# Patient Record
Sex: Female | Born: 1950
Health system: Southern US, Community
[De-identification: ages and names within clinical notes are randomized; demographics above are authoritative.]

## PROBLEM LIST (undated history)

## (undated) DIAGNOSIS — G8929 Other chronic pain: Secondary | ICD-10-CM

## (undated) DIAGNOSIS — Z923 Personal history of irradiation: Secondary | ICD-10-CM

## (undated) DIAGNOSIS — M1990 Unspecified osteoarthritis, unspecified site: Secondary | ICD-10-CM

## (undated) DIAGNOSIS — M545 Low back pain, unspecified: Secondary | ICD-10-CM

## (undated) DIAGNOSIS — K922 Gastrointestinal hemorrhage, unspecified: Secondary | ICD-10-CM

## (undated) DIAGNOSIS — K219 Gastro-esophageal reflux disease without esophagitis: Secondary | ICD-10-CM

## (undated) DIAGNOSIS — C50911 Malignant neoplasm of unspecified site of right female breast: Secondary | ICD-10-CM

## (undated) DIAGNOSIS — G709 Myoneural disorder, unspecified: Secondary | ICD-10-CM

## (undated) DIAGNOSIS — K760 Fatty (change of) liver, not elsewhere classified: Secondary | ICD-10-CM

## (undated) DIAGNOSIS — C50919 Malignant neoplasm of unspecified site of unspecified female breast: Secondary | ICD-10-CM

## (undated) DIAGNOSIS — I10 Essential (primary) hypertension: Secondary | ICD-10-CM

## (undated) DIAGNOSIS — E876 Hypokalemia: Secondary | ICD-10-CM

## (undated) HISTORY — PX: FINGER SURGERY: SHX640

## (undated) HISTORY — DX: Hypokalemia: E87.6

## (undated) HISTORY — PX: COLONOSCOPY: SHX5424

## (undated) HISTORY — DX: Fatty (change of) liver, not elsewhere classified: K76.0

---

## 1998-01-20 ENCOUNTER — Ambulatory Visit (HOSPITAL_COMMUNITY): Admission: RE | Admit: 1998-01-20 | Discharge: 1998-01-20 | Payer: Self-pay | Admitting: Family Medicine

## 1998-01-20 ENCOUNTER — Encounter: Payer: Self-pay | Admitting: Family Medicine

## 1998-08-06 ENCOUNTER — Emergency Department (HOSPITAL_COMMUNITY): Admission: EM | Admit: 1998-08-06 | Discharge: 1998-08-06 | Payer: Self-pay | Admitting: Emergency Medicine

## 2002-02-23 ENCOUNTER — Encounter: Payer: Self-pay | Admitting: Internal Medicine

## 2002-02-23 ENCOUNTER — Ambulatory Visit (HOSPITAL_COMMUNITY): Admission: RE | Admit: 2002-02-23 | Discharge: 2002-02-23 | Payer: Self-pay | Admitting: Internal Medicine

## 2002-04-06 ENCOUNTER — Encounter: Admission: RE | Admit: 2002-04-06 | Discharge: 2002-04-06 | Payer: Self-pay | Admitting: *Deleted

## 2002-04-23 ENCOUNTER — Encounter (INDEPENDENT_AMBULATORY_CARE_PROVIDER_SITE_OTHER): Payer: Self-pay | Admitting: Specialist

## 2002-04-23 ENCOUNTER — Ambulatory Visit (HOSPITAL_COMMUNITY): Admission: RE | Admit: 2002-04-23 | Discharge: 2002-04-23 | Payer: Self-pay | Admitting: Internal Medicine

## 2003-09-05 ENCOUNTER — Emergency Department (HOSPITAL_COMMUNITY): Admission: EM | Admit: 2003-09-05 | Discharge: 2003-09-05 | Payer: Self-pay | Admitting: Family Medicine

## 2004-03-13 ENCOUNTER — Ambulatory Visit: Payer: Self-pay | Admitting: Nurse Practitioner

## 2004-04-20 ENCOUNTER — Ambulatory Visit: Payer: Self-pay | Admitting: Nurse Practitioner

## 2004-04-30 ENCOUNTER — Ambulatory Visit (HOSPITAL_COMMUNITY): Admission: RE | Admit: 2004-04-30 | Discharge: 2004-04-30 | Payer: Self-pay | Admitting: Family Medicine

## 2004-05-07 ENCOUNTER — Ambulatory Visit: Payer: Self-pay | Admitting: Nurse Practitioner

## 2004-05-08 ENCOUNTER — Ambulatory Visit: Payer: Self-pay | Admitting: *Deleted

## 2005-06-12 ENCOUNTER — Ambulatory Visit: Payer: Self-pay | Admitting: Nurse Practitioner

## 2005-06-26 ENCOUNTER — Ambulatory Visit (HOSPITAL_COMMUNITY): Admission: RE | Admit: 2005-06-26 | Discharge: 2005-06-26 | Payer: Self-pay | Admitting: Family Medicine

## 2005-07-29 ENCOUNTER — Ambulatory Visit: Payer: Self-pay | Admitting: Nurse Practitioner

## 2005-09-04 ENCOUNTER — Ambulatory Visit: Payer: Self-pay | Admitting: Obstetrics & Gynecology

## 2005-09-17 ENCOUNTER — Ambulatory Visit: Payer: Self-pay | Admitting: Internal Medicine

## 2005-10-03 ENCOUNTER — Ambulatory Visit: Payer: Self-pay | Admitting: Nurse Practitioner

## 2005-10-15 ENCOUNTER — Ambulatory Visit: Payer: Self-pay | Admitting: Nurse Practitioner

## 2005-12-25 ENCOUNTER — Ambulatory Visit: Payer: Self-pay | Admitting: Nurse Practitioner

## 2005-12-25 ENCOUNTER — Ambulatory Visit (HOSPITAL_COMMUNITY): Admission: RE | Admit: 2005-12-25 | Discharge: 2005-12-25 | Payer: Self-pay | Admitting: Nurse Practitioner

## 2006-05-20 ENCOUNTER — Ambulatory Visit: Payer: Self-pay | Admitting: Nurse Practitioner

## 2006-12-17 ENCOUNTER — Encounter (INDEPENDENT_AMBULATORY_CARE_PROVIDER_SITE_OTHER): Payer: Self-pay | Admitting: *Deleted

## 2007-01-30 ENCOUNTER — Encounter (INDEPENDENT_AMBULATORY_CARE_PROVIDER_SITE_OTHER): Payer: Self-pay | Admitting: Nurse Practitioner

## 2007-01-30 DIAGNOSIS — G4700 Insomnia, unspecified: Secondary | ICD-10-CM

## 2007-01-30 DIAGNOSIS — F1010 Alcohol abuse, uncomplicated: Secondary | ICD-10-CM

## 2007-01-30 DIAGNOSIS — D649 Anemia, unspecified: Secondary | ICD-10-CM

## 2007-01-30 DIAGNOSIS — D259 Leiomyoma of uterus, unspecified: Secondary | ICD-10-CM | POA: Insufficient documentation

## 2007-05-01 ENCOUNTER — Ambulatory Visit: Payer: Self-pay | Admitting: Internal Medicine

## 2007-05-20 ENCOUNTER — Ambulatory Visit: Payer: Self-pay | Admitting: Internal Medicine

## 2007-05-20 ENCOUNTER — Encounter (INDEPENDENT_AMBULATORY_CARE_PROVIDER_SITE_OTHER): Payer: Self-pay | Admitting: Nurse Practitioner

## 2007-05-20 LAB — CONVERTED CEMR LAB
HDL: 99 mg/dL (ref >39)
LDL Cholesterol: 69 mg/dL (ref 0–99)
Total CHOL/HDL Ratio: 1.9

## 2007-06-10 ENCOUNTER — Encounter (INDEPENDENT_AMBULATORY_CARE_PROVIDER_SITE_OTHER): Payer: Self-pay | Admitting: Nurse Practitioner

## 2007-06-10 ENCOUNTER — Ambulatory Visit: Payer: Self-pay | Admitting: Internal Medicine

## 2007-06-17 ENCOUNTER — Ambulatory Visit (HOSPITAL_COMMUNITY): Admission: RE | Admit: 2007-06-17 | Discharge: 2007-06-17 | Payer: Self-pay | Admitting: Family Medicine

## 2007-09-09 ENCOUNTER — Ambulatory Visit: Payer: Self-pay | Admitting: Internal Medicine

## 2007-09-11 ENCOUNTER — Ambulatory Visit (HOSPITAL_COMMUNITY): Admission: RE | Admit: 2007-09-11 | Discharge: 2007-09-11 | Payer: Self-pay | Admitting: Family Medicine

## 2007-10-21 ENCOUNTER — Encounter: Admission: RE | Admit: 2007-10-21 | Discharge: 2007-12-24 | Payer: Self-pay | Admitting: Family Medicine

## 2007-12-14 ENCOUNTER — Ambulatory Visit: Payer: Self-pay | Admitting: Internal Medicine

## 2007-12-28 ENCOUNTER — Ambulatory Visit: Payer: Self-pay | Admitting: Internal Medicine

## 2007-12-28 ENCOUNTER — Encounter (INDEPENDENT_AMBULATORY_CARE_PROVIDER_SITE_OTHER): Payer: Self-pay | Admitting: Family Medicine

## 2007-12-28 LAB — CONVERTED CEMR LAB
ALT: 23 units/L (ref 0–35)
Alkaline Phosphatase: 61 units/L (ref 39–117)
Sodium: 138 meq/L (ref 135–145)
Total Bilirubin: 0.4 mg/dL (ref 0.3–1.2)
Total Protein: 7.7 g/dL (ref 6.0–8.3)

## 2008-01-22 ENCOUNTER — Encounter (INDEPENDENT_AMBULATORY_CARE_PROVIDER_SITE_OTHER): Payer: Self-pay | Admitting: *Deleted

## 2008-01-22 ENCOUNTER — Ambulatory Visit: Payer: Self-pay | Admitting: Internal Medicine

## 2008-01-22 LAB — CONVERTED CEMR LAB
Albumin: 4.2 g/dL (ref 3.5–5.2)
Alkaline Phosphatase: 58 units/L (ref 39–117)
BUN: 17 mg/dL (ref 6–23)
Glucose, Bld: 95 mg/dL (ref 70–99)
Total Bilirubin: 0.4 mg/dL (ref 0.3–1.2)

## 2008-02-09 ENCOUNTER — Ambulatory Visit: Payer: Self-pay | Admitting: Family Medicine

## 2008-03-09 ENCOUNTER — Ambulatory Visit: Payer: Self-pay | Admitting: Internal Medicine

## 2008-05-04 ENCOUNTER — Ambulatory Visit: Payer: Self-pay | Admitting: Internal Medicine

## 2008-05-26 ENCOUNTER — Ambulatory Visit: Payer: Self-pay | Admitting: Internal Medicine

## 2008-06-17 ENCOUNTER — Ambulatory Visit (HOSPITAL_COMMUNITY): Admission: RE | Admit: 2008-06-17 | Discharge: 2008-06-17 | Payer: Self-pay | Admitting: Family Medicine

## 2008-06-23 ENCOUNTER — Ambulatory Visit: Payer: Self-pay | Admitting: Family Medicine

## 2008-10-12 ENCOUNTER — Ambulatory Visit: Payer: Self-pay | Admitting: Family Medicine

## 2008-10-13 ENCOUNTER — Ambulatory Visit: Payer: Self-pay | Admitting: Internal Medicine

## 2008-11-03 ENCOUNTER — Encounter (INDEPENDENT_AMBULATORY_CARE_PROVIDER_SITE_OTHER): Payer: Self-pay | Admitting: Internal Medicine

## 2008-11-03 ENCOUNTER — Ambulatory Visit: Payer: Self-pay | Admitting: Internal Medicine

## 2008-11-03 LAB — CONVERTED CEMR LAB
Cholesterol: 212 mg/dL — ABNORMAL HIGH (ref 0–200)
GC Probe Amp, Genital: NEGATIVE
HDL: 97 mg/dL (ref >39)
Total CHOL/HDL Ratio: 2.2

## 2008-11-04 ENCOUNTER — Encounter (INDEPENDENT_AMBULATORY_CARE_PROVIDER_SITE_OTHER): Payer: Self-pay | Admitting: Internal Medicine

## 2008-11-29 ENCOUNTER — Ambulatory Visit: Payer: Self-pay | Admitting: Internal Medicine

## 2008-11-29 ENCOUNTER — Encounter (INDEPENDENT_AMBULATORY_CARE_PROVIDER_SITE_OTHER): Payer: Self-pay | Admitting: Internal Medicine

## 2008-11-29 LAB — CONVERTED CEMR LAB
AST: 26 units/L (ref 0–37)
BUN: 11 mg/dL (ref 6–23)
CO2: 23 meq/L (ref 19–32)
Calcium: 9.5 mg/dL (ref 8.4–10.5)
Chloride: 103 meq/L (ref 96–112)
Creatinine, Ser: 0.94 mg/dL (ref 0.40–1.20)
Eosinophils Absolute: 0.5 10*3/uL (ref 0.0–0.7)
Eosinophils Relative: 7 % — ABNORMAL HIGH (ref 0–5)
Glucose, Bld: 93 mg/dL (ref 70–99)
HCT: 40.2 % (ref 36.0–46.0)
Lymphs Abs: 3.2 10*3/uL (ref 0.7–4.0)
MCV: 93.5 fL (ref 78.0–100.0)
Monocytes Absolute: 0.6 10*3/uL (ref 0.1–1.0)
Platelets: 393 10*3/uL (ref 150–400)
WBC: 7 10*3/uL (ref 4.0–10.5)

## 2008-12-01 ENCOUNTER — Encounter (INDEPENDENT_AMBULATORY_CARE_PROVIDER_SITE_OTHER): Payer: Self-pay | Admitting: Internal Medicine

## 2009-02-09 ENCOUNTER — Ambulatory Visit: Payer: Self-pay | Admitting: Internal Medicine

## 2009-02-28 ENCOUNTER — Telehealth (INDEPENDENT_AMBULATORY_CARE_PROVIDER_SITE_OTHER): Payer: Self-pay | Admitting: *Deleted

## 2009-02-28 ENCOUNTER — Ambulatory Visit: Payer: Self-pay | Admitting: Internal Medicine

## 2009-02-28 LAB — CONVERTED CEMR LAB
BUN: 11 mg/dL (ref 6–23)
Chloride: 103 meq/L (ref 96–112)
Eosinophils Relative: 5 % (ref 0–5)
Glucose, Bld: 97 mg/dL (ref 70–99)
HCT: 38.3 % (ref 36.0–46.0)
Hemoglobin: 12.8 g/dL (ref 12.0–15.0)
Lymphocytes Relative: 43 % (ref 12–46)
Lymphs Abs: 3.7 10*3/uL (ref 0.7–4.0)
Monocytes Absolute: 0.7 10*3/uL (ref 0.1–1.0)
Monocytes Relative: 8 % (ref 3–12)
Neutro Abs: 3.7 10*3/uL (ref 1.7–7.7)
Potassium: 3.6 meq/L (ref 3.5–5.3)
RBC: 4.25 M/uL (ref 3.87–5.11)
Sodium: 140 meq/L (ref 135–145)
WBC: 8.5 10*3/uL (ref 4.0–10.5)

## 2009-03-01 ENCOUNTER — Ambulatory Visit (HOSPITAL_COMMUNITY): Admission: RE | Admit: 2009-03-01 | Discharge: 2009-03-01 | Payer: Self-pay | Admitting: Internal Medicine

## 2009-03-13 ENCOUNTER — Encounter: Admission: RE | Admit: 2009-03-13 | Discharge: 2009-03-13 | Payer: Self-pay | Admitting: Gynecology

## 2009-03-30 ENCOUNTER — Ambulatory Visit: Payer: Self-pay | Admitting: Internal Medicine

## 2009-04-01 DIAGNOSIS — Z923 Personal history of irradiation: Secondary | ICD-10-CM

## 2009-04-01 DIAGNOSIS — C50919 Malignant neoplasm of unspecified site of unspecified female breast: Secondary | ICD-10-CM

## 2009-04-01 HISTORY — DX: Personal history of irradiation: Z92.3

## 2009-04-01 HISTORY — DX: Malignant neoplasm of unspecified site of unspecified female breast: C50.919

## 2009-04-01 HISTORY — PX: BREAST BIOPSY: SHX20

## 2009-04-01 HISTORY — PX: BREAST LUMPECTOMY: SHX2

## 2009-05-02 DIAGNOSIS — C50911 Malignant neoplasm of unspecified site of right female breast: Secondary | ICD-10-CM

## 2009-05-02 HISTORY — PX: MASTECTOMY COMPLETE / SIMPLE W/ SENTINEL NODE BIOPSY: SUR846

## 2009-05-02 HISTORY — DX: Malignant neoplasm of unspecified site of right female breast: C50.911

## 2009-05-05 ENCOUNTER — Encounter: Admission: RE | Admit: 2009-05-05 | Discharge: 2009-05-05 | Payer: Self-pay | Admitting: Infectious Diseases

## 2009-05-08 ENCOUNTER — Encounter: Admission: RE | Admit: 2009-05-08 | Discharge: 2009-05-08 | Payer: Self-pay | Admitting: Infectious Diseases

## 2009-05-15 ENCOUNTER — Encounter: Admission: RE | Admit: 2009-05-15 | Discharge: 2009-05-15 | Payer: Self-pay | Admitting: Orthopedic Surgery

## 2009-05-18 ENCOUNTER — Ambulatory Visit: Payer: Self-pay | Admitting: Internal Medicine

## 2009-05-19 ENCOUNTER — Ambulatory Visit (HOSPITAL_COMMUNITY): Admission: RE | Admit: 2009-05-19 | Discharge: 2009-05-19 | Payer: Self-pay | Admitting: General Surgery

## 2009-05-19 ENCOUNTER — Encounter: Admission: RE | Admit: 2009-05-19 | Discharge: 2009-05-19 | Payer: Self-pay | Admitting: General Surgery

## 2009-05-20 ENCOUNTER — Encounter (INDEPENDENT_AMBULATORY_CARE_PROVIDER_SITE_OTHER): Payer: Self-pay | Admitting: Internal Medicine

## 2009-05-24 ENCOUNTER — Ambulatory Visit: Payer: Self-pay | Admitting: Oncology

## 2009-05-24 LAB — CBC WITH DIFFERENTIAL/PLATELET
Basophils Absolute: 0 10*3/uL (ref 0.0–0.1)
Eosinophils Absolute: 0.4 10*3/uL (ref 0.0–0.5)
HGB: 12.8 g/dL (ref 11.6–15.9)
MCV: 93 fL (ref 79.5–101.0)
MONO#: 0.5 10*3/uL (ref 0.1–0.9)
MONO%: 10.3 % (ref 0.0–14.0)
NEUT#: 1.6 10*3/uL (ref 1.5–6.5)
Platelets: 376 10*3/uL (ref 145–400)
RDW: 14.2 % (ref 11.2–14.5)

## 2009-05-24 LAB — COMPREHENSIVE METABOLIC PANEL
Albumin: 4.2 g/dL (ref 3.5–5.2)
Alkaline Phosphatase: 53 U/L (ref 39–117)
BUN: 5 mg/dL — ABNORMAL LOW (ref 6–23)
CO2: 29 mEq/L (ref 19–32)
Calcium: 9.8 mg/dL (ref 8.4–10.5)
Glucose, Bld: 96 mg/dL (ref 70–99)
Potassium: 3.6 mEq/L (ref 3.5–5.3)

## 2009-05-24 LAB — CANCER ANTIGEN 27.29: CA 27.29: 82 U/mL — ABNORMAL HIGH (ref 0–39)

## 2009-05-24 LAB — CEA: CEA: 1.2 ng/mL (ref 0.0–5.0)

## 2009-05-25 ENCOUNTER — Encounter (INDEPENDENT_AMBULATORY_CARE_PROVIDER_SITE_OTHER): Payer: Self-pay | Admitting: Internal Medicine

## 2009-05-25 ENCOUNTER — Ambulatory Visit: Payer: Self-pay | Admitting: Internal Medicine

## 2009-05-25 LAB — CONVERTED CEMR LAB
Eosinophils Absolute: 0.4 10*3/uL (ref 0.0–0.7)
HCT: 39.7 % (ref 36.0–46.0)
Lymphocytes Relative: 49 % — ABNORMAL HIGH (ref 12–46)
Lymphs Abs: 3 10*3/uL (ref 0.7–4.0)
MCV: 95.2 fL (ref 78.0–100.0)
Monocytes Relative: 8 % (ref 3–12)
Neutrophils Relative %: 37 % — ABNORMAL LOW (ref 43–77)
RBC: 4.17 M/uL (ref 3.87–5.11)
Saturation Ratios: 16 % — ABNORMAL LOW (ref 20–55)
UIBC: 272 ug/dL
WBC: 6 10*3/uL (ref 4.0–10.5)

## 2009-05-26 ENCOUNTER — Ambulatory Visit: Admission: RE | Admit: 2009-05-26 | Discharge: 2009-08-08 | Payer: Self-pay | Admitting: Radiation Oncology

## 2009-05-26 ENCOUNTER — Ambulatory Visit (HOSPITAL_COMMUNITY): Admission: RE | Admit: 2009-05-26 | Discharge: 2009-05-26 | Payer: Self-pay | Admitting: Oncology

## 2009-06-30 ENCOUNTER — Ambulatory Visit: Payer: Self-pay | Admitting: Oncology

## 2009-07-04 LAB — CBC WITH DIFFERENTIAL/PLATELET
Basophils Absolute: 0.1 10*3/uL (ref 0.0–0.1)
EOS%: 7.3 % — ABNORMAL HIGH (ref 0.0–7.0)
HCT: 38.8 % (ref 34.8–46.6)
HGB: 13.2 g/dL (ref 11.6–15.9)
MCH: 31.9 pg (ref 25.1–34.0)
MCV: 93.4 fL (ref 79.5–101.0)
MONO%: 11.1 % (ref 0.0–14.0)
NEUT%: 46.7 % (ref 38.4–76.8)
Platelets: 365 10*3/uL (ref 145–400)

## 2009-07-04 LAB — COMPREHENSIVE METABOLIC PANEL
AST: 20 U/L (ref 0–37)
Alkaline Phosphatase: 62 U/L (ref 39–117)
BUN: 18 mg/dL (ref 6–23)
Calcium: 9.7 mg/dL (ref 8.4–10.5)
Creatinine, Ser: 0.92 mg/dL (ref 0.40–1.20)

## 2009-07-05 ENCOUNTER — Ambulatory Visit: Admission: RE | Admit: 2009-07-05 | Discharge: 2009-07-05 | Payer: Self-pay | Admitting: Gynecology

## 2009-07-06 ENCOUNTER — Ambulatory Visit (HOSPITAL_COMMUNITY): Admission: RE | Admit: 2009-07-06 | Discharge: 2009-07-06 | Payer: Self-pay | Admitting: Gynecology

## 2009-07-12 ENCOUNTER — Ambulatory Visit: Payer: Self-pay | Admitting: Internal Medicine

## 2009-07-12 LAB — CONVERTED CEMR LAB
Basophils Absolute: 0 10*3/uL (ref 0.0–0.1)
Basophils Relative: 0 % (ref 0–1)
Hemoglobin: 13.6 g/dL (ref 12.0–15.0)
Lymphocytes Relative: 35 % (ref 12–46)
MCHC: 32.6 g/dL (ref 30.0–36.0)
Monocytes Absolute: 0.6 10*3/uL (ref 0.1–1.0)
Neutro Abs: 2.2 10*3/uL (ref 1.7–7.7)
Neutrophils Relative %: 44 % (ref 43–77)
Platelets: 349 10*3/uL (ref 150–400)
RDW: 14.9 % (ref 11.5–15.5)
Saturation Ratios: 29 % (ref 20–55)
TIBC: 385 ug/dL (ref 250–470)
UIBC: 273 ug/dL

## 2009-07-19 ENCOUNTER — Ambulatory Visit: Payer: Self-pay | Admitting: Internal Medicine

## 2009-07-25 ENCOUNTER — Ambulatory Visit: Admission: RE | Admit: 2009-07-25 | Discharge: 2009-07-25 | Payer: Self-pay | Admitting: Gynecology

## 2009-08-15 ENCOUNTER — Ambulatory Visit: Payer: Self-pay | Admitting: Internal Medicine

## 2009-08-16 ENCOUNTER — Ambulatory Visit (HOSPITAL_COMMUNITY): Admission: RE | Admit: 2009-08-16 | Discharge: 2009-08-16 | Payer: Self-pay | Admitting: Internal Medicine

## 2009-08-18 ENCOUNTER — Ambulatory Visit: Payer: Self-pay | Admitting: Internal Medicine

## 2009-08-29 ENCOUNTER — Ambulatory Visit (HOSPITAL_BASED_OUTPATIENT_CLINIC_OR_DEPARTMENT_OTHER): Admission: RE | Admit: 2009-08-29 | Discharge: 2009-08-29 | Payer: Self-pay | Admitting: Gynecologic Oncology

## 2009-09-15 ENCOUNTER — Ambulatory Visit: Payer: Self-pay | Admitting: Oncology

## 2009-09-19 LAB — COMPREHENSIVE METABOLIC PANEL
Albumin: 4.3 g/dL (ref 3.5–5.2)
CO2: 21 mEq/L (ref 19–32)
Glucose, Bld: 109 mg/dL — ABNORMAL HIGH (ref 70–99)
Sodium: 138 mEq/L (ref 135–145)
Total Bilirubin: 0.5 mg/dL (ref 0.3–1.2)
Total Protein: 7.6 g/dL (ref 6.0–8.3)

## 2009-09-19 LAB — CBC WITH DIFFERENTIAL/PLATELET
Eosinophils Absolute: 0.1 10*3/uL (ref 0.0–0.5)
HCT: 37.3 % (ref 34.8–46.6)
HGB: 13 g/dL (ref 11.6–15.9)
LYMPH%: 30.4 % (ref 14.0–49.7)
MONO#: 0.6 10*3/uL (ref 0.1–0.9)
NEUT#: 2.9 10*3/uL (ref 1.5–6.5)
Platelets: 321 10*3/uL (ref 145–400)
RBC: 4.03 10*6/uL (ref 3.70–5.45)
WBC: 5.2 10*3/uL (ref 3.9–10.3)

## 2009-09-19 LAB — CA 125: CA 125: 9.9 U/mL (ref 0.0–30.2)

## 2009-09-19 LAB — CEA: CEA: 1.2 ng/mL (ref 0.0–5.0)

## 2009-09-20 ENCOUNTER — Ambulatory Visit: Admission: RE | Admit: 2009-09-20 | Discharge: 2009-09-20 | Payer: Self-pay | Admitting: Gynecology

## 2009-09-21 ENCOUNTER — Encounter: Admission: RE | Admit: 2009-09-21 | Discharge: 2009-09-21 | Payer: Self-pay | Admitting: Oncology

## 2009-09-22 ENCOUNTER — Ambulatory Visit (HOSPITAL_COMMUNITY): Admission: RE | Admit: 2009-09-22 | Discharge: 2009-09-22 | Payer: Self-pay | Admitting: Gynecology

## 2009-09-26 ENCOUNTER — Ambulatory Visit: Payer: Self-pay | Admitting: Internal Medicine

## 2009-09-26 LAB — CONVERTED CEMR LAB
BUN: 11 mg/dL (ref 6–23)
CO2: 25 meq/L (ref 19–32)
Calcium: 9.5 mg/dL (ref 8.4–10.5)
Creatinine, Ser: 0.78 mg/dL (ref 0.40–1.20)
Ferritin: 71 ng/mL (ref 10–291)
Glucose, Bld: 102 mg/dL — ABNORMAL HIGH (ref 70–99)
Iron: 66 ug/dL (ref 42–145)
Sodium: 143 meq/L (ref 135–145)
UIBC: 298 ug/dL

## 2009-10-02 LAB — ESTRADIOL, ULTRA SENS: Estradiol, Ultra Sensitive: <2 pg/mL

## 2009-11-07 ENCOUNTER — Telehealth (INDEPENDENT_AMBULATORY_CARE_PROVIDER_SITE_OTHER): Payer: Self-pay | Admitting: *Deleted

## 2009-11-07 ENCOUNTER — Emergency Department (HOSPITAL_COMMUNITY): Admission: EM | Admit: 2009-11-07 | Discharge: 2009-11-07 | Payer: Self-pay | Admitting: Family Medicine

## 2009-11-22 ENCOUNTER — Ambulatory Visit: Payer: Self-pay | Admitting: Internal Medicine

## 2009-11-23 ENCOUNTER — Emergency Department (HOSPITAL_COMMUNITY): Admission: EM | Admit: 2009-11-23 | Discharge: 2009-11-23 | Payer: Self-pay | Admitting: Family Medicine

## 2009-11-29 ENCOUNTER — Ambulatory Visit (HOSPITAL_BASED_OUTPATIENT_CLINIC_OR_DEPARTMENT_OTHER): Admission: RE | Admit: 2009-11-29 | Discharge: 2009-11-29 | Payer: Self-pay | Admitting: General Surgery

## 2009-12-13 ENCOUNTER — Ambulatory Visit: Payer: Self-pay | Admitting: Internal Medicine

## 2010-01-10 ENCOUNTER — Ambulatory Visit (HOSPITAL_COMMUNITY): Admission: RE | Admit: 2010-01-10 | Discharge: 2010-01-10 | Payer: Self-pay | Admitting: Family Medicine

## 2010-01-16 ENCOUNTER — Ambulatory Visit: Payer: Self-pay | Admitting: Oncology

## 2010-01-18 LAB — COMPREHENSIVE METABOLIC PANEL
ALT: 43 U/L — ABNORMAL HIGH (ref 0–35)
AST: 41 U/L — ABNORMAL HIGH (ref 0–37)
Alkaline Phosphatase: 51 U/L (ref 39–117)
BUN: 15 mg/dL (ref 6–23)
Creatinine, Ser: 1.06 mg/dL (ref 0.40–1.20)
Potassium: 3.6 mEq/L (ref 3.5–5.3)

## 2010-01-18 LAB — CBC WITH DIFFERENTIAL/PLATELET
BASO%: 0.3 % (ref 0.0–2.0)
Basophils Absolute: 0 10*3/uL (ref 0.0–0.1)
EOS%: 1.9 % (ref 0.0–7.0)
HGB: 12.9 g/dL (ref 11.6–15.9)
MCH: 33.1 pg (ref 25.1–34.0)
MCHC: 34.9 g/dL (ref 31.5–36.0)
MCV: 94.9 fL (ref 79.5–101.0)
MONO%: 11.7 % (ref 0.0–14.0)
RDW: 13.9 % (ref 11.2–14.5)
lymph#: 1.6 10*3/uL (ref 0.9–3.3)

## 2010-01-23 ENCOUNTER — Ambulatory Visit (HOSPITAL_COMMUNITY): Admission: RE | Admit: 2010-01-23 | Discharge: 2010-01-23 | Payer: Self-pay | Admitting: Family Medicine

## 2010-03-01 ENCOUNTER — Ambulatory Visit (HOSPITAL_COMMUNITY)
Admission: RE | Admit: 2010-03-01 | Discharge: 2010-03-01 | Payer: Self-pay | Source: Home / Self Care | Admitting: Gynecologic Oncology

## 2010-03-01 ENCOUNTER — Ambulatory Visit
Admission: RE | Admit: 2010-03-01 | Discharge: 2010-03-01 | Payer: Self-pay | Source: Home / Self Care | Admitting: Gynecologic Oncology

## 2010-03-15 ENCOUNTER — Encounter
Admission: RE | Admit: 2010-03-15 | Discharge: 2010-03-15 | Payer: Self-pay | Source: Home / Self Care | Attending: Oncology | Admitting: Oncology

## 2010-04-12 IMAGING — US US TRANSVAGINAL NON-OB
1 series · 13 of 25 positions shown · non-contrast
Comparison: CT on 05/26/2009

07/07/2009 - DUPLICATE COPY for exam association in [facility] – No change from original report.
CLINICAL DATA: Cystic pelvic mass seen on recent CT. Breast
 cancer. Post menopausal.

 TRANSABDOMINAL AND TRANSVAGINAL ULTRASOUND OF PELVIS
TECHNIQUE: Both transabdominal and transvaginal ultrasound
 examinations of the pelvis were performed including evaluation of
 the uterus, ovaries, adnexal regions, and pelvic cul-de-sac.

[Series 1: us transvaginal non-ob · 0.24mm/px · 13 of 55 slices shown]
[im 1/55]
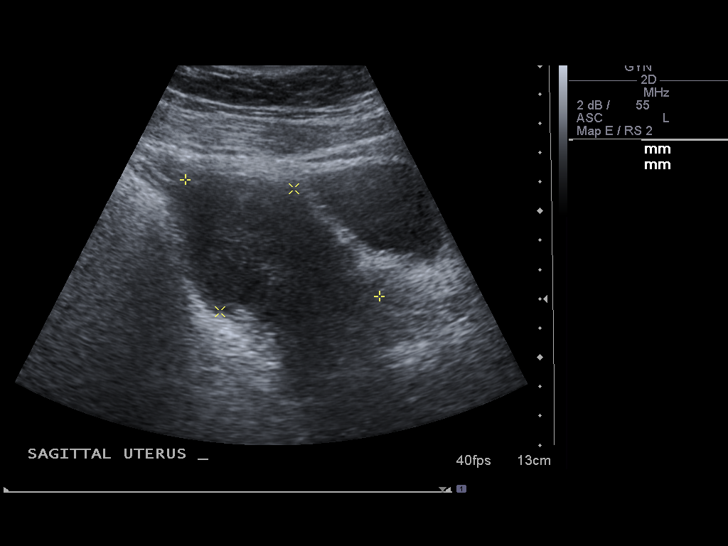
[im 5/55]
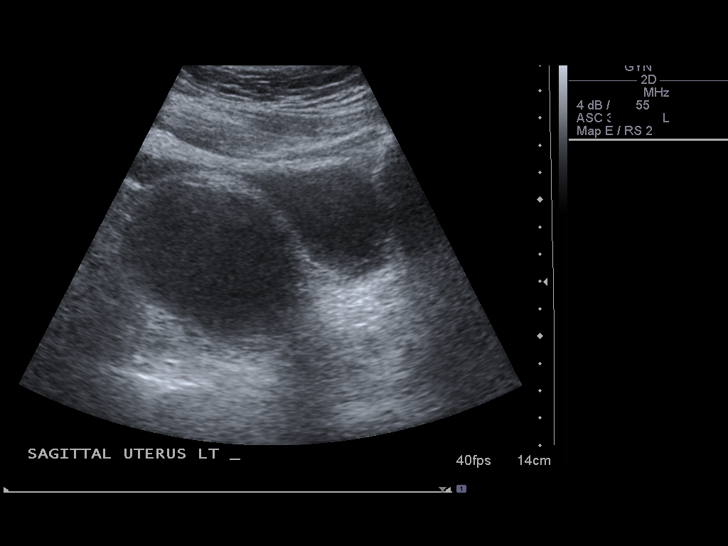
[im 10/55]
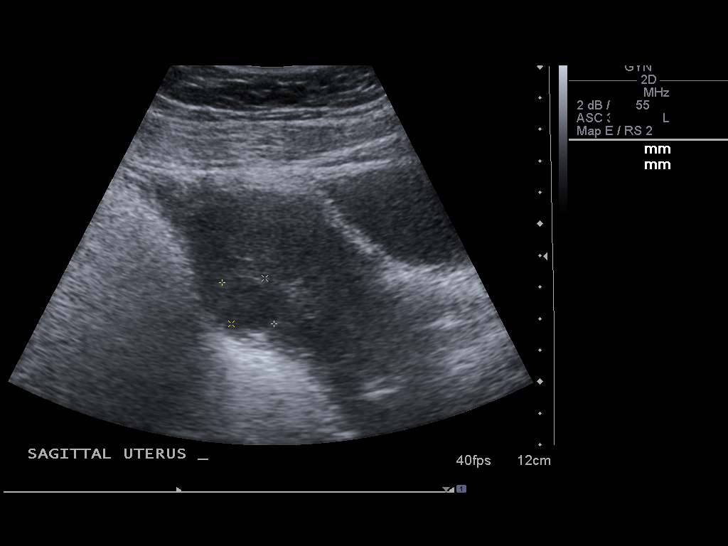
[im 14/55]
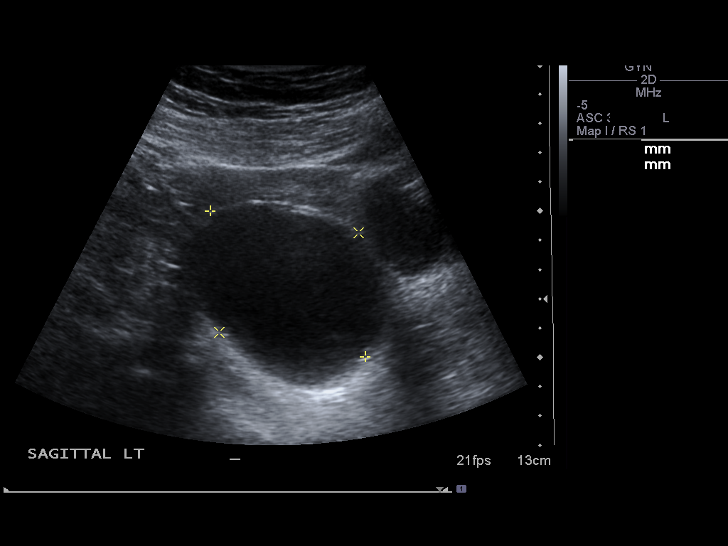
[im 19/55]
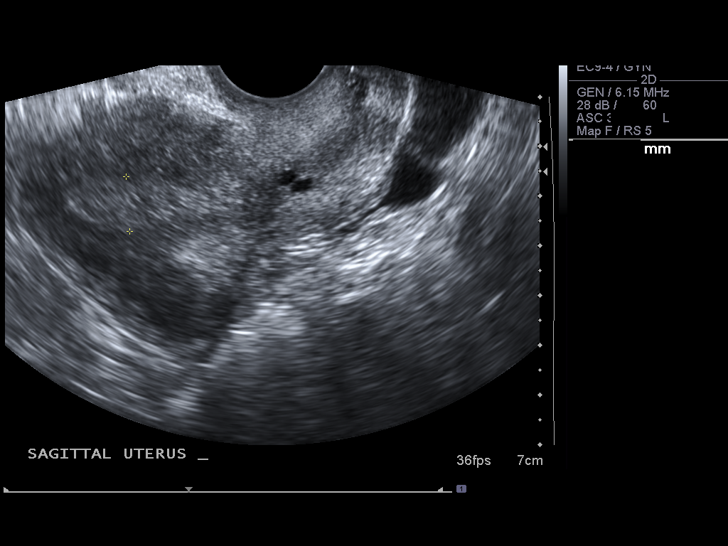
[im 23/55]
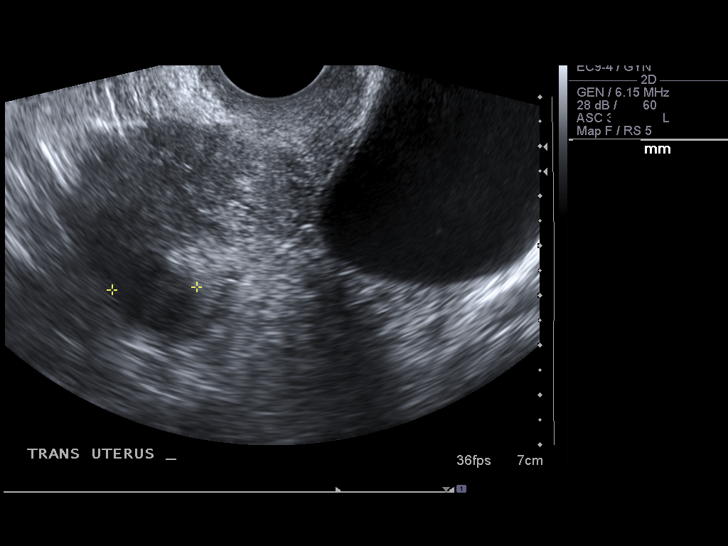
[im 28/55]
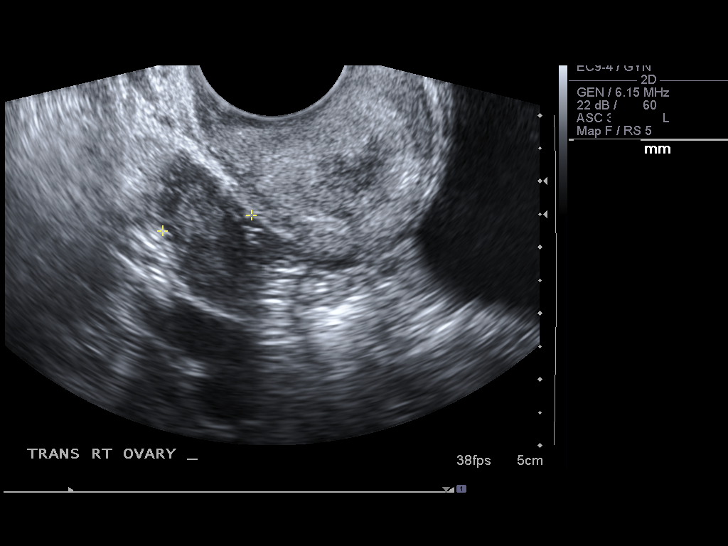
[im 32/55]
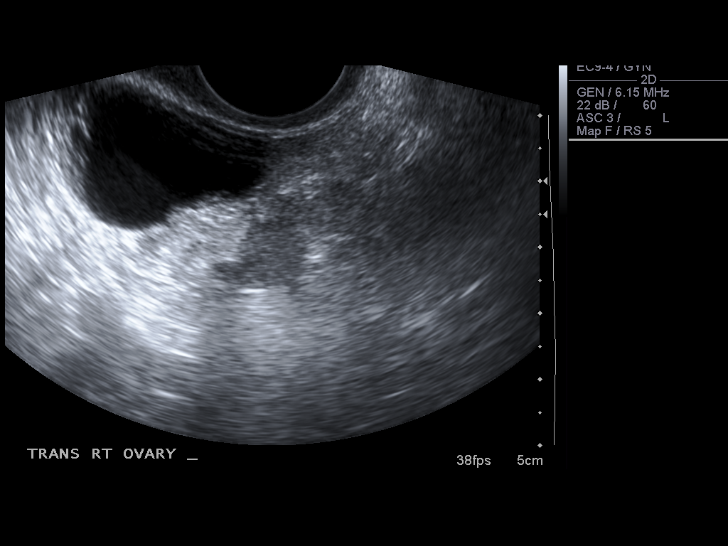
[im 37/55]
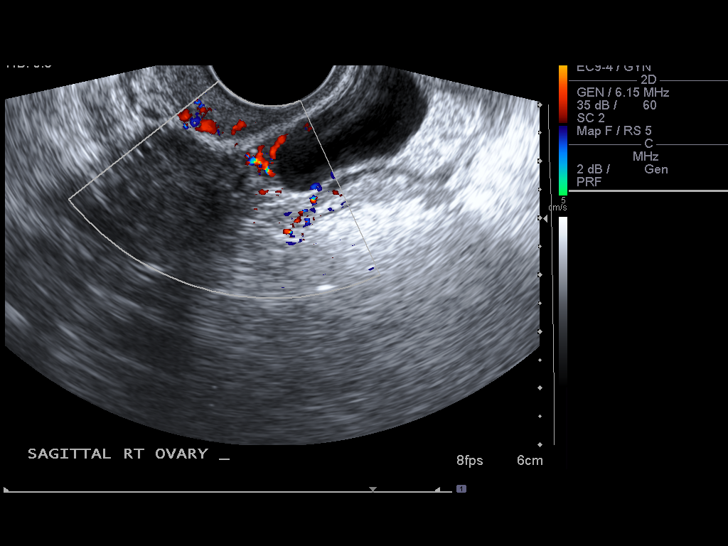
[im 41/55]
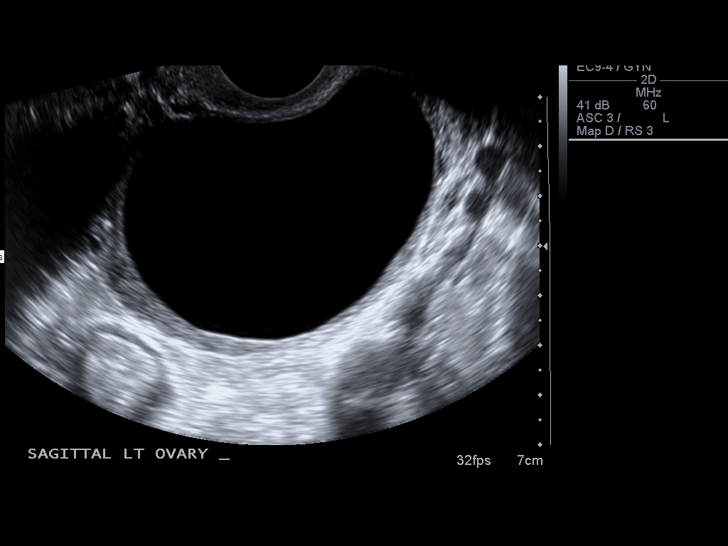
[im 46/55]
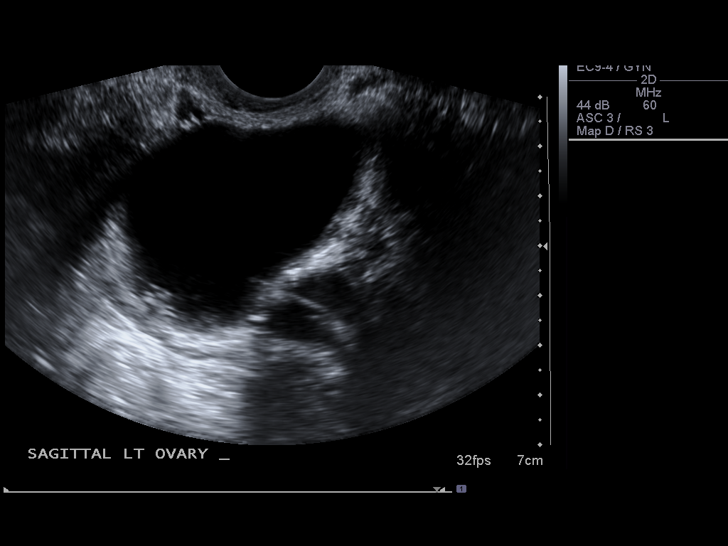
[im 50/55]
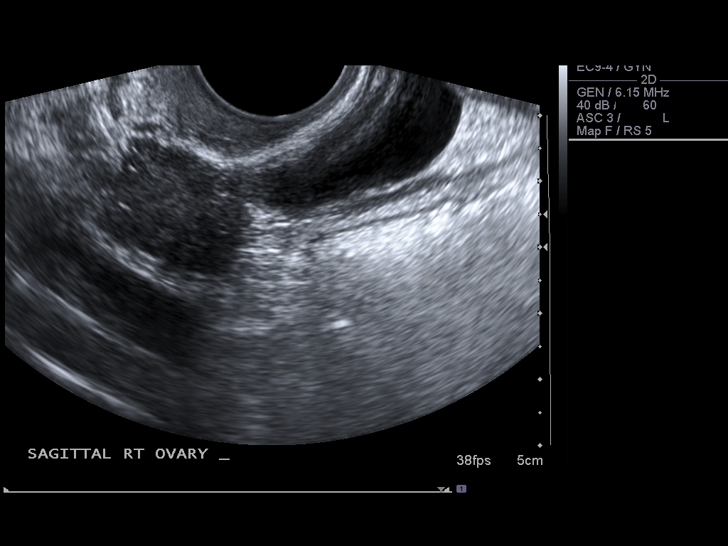
[im 55/55]
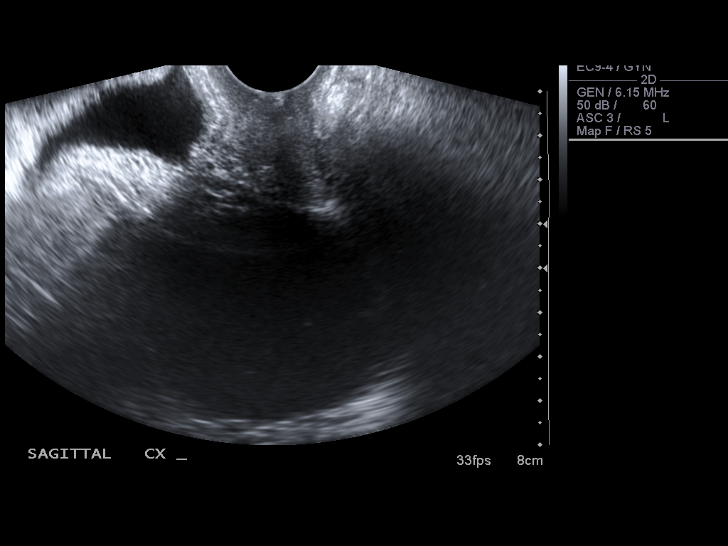

[13 of 25 positions shown; findings below may reference images not displayed]

FINDINGS: Uterus measures 7.7 x 4.9 x 4.7 cm. An intramural fibroid is seen
 in the posterior uterine body measuring 2.3 x 1.4 x 1.7 cm.

 Endometrium measures 11 mm in thickness. Inhomogeneous echotexture
 with probable tiny cystic foci.

 Right Ovary measures 2.5 x 1.5 x 1.4 cm. Cystic lesion with mild
 wall irregularity abutting the medial border of the right ovary
 which measures 3.2 x 1.8 x 1.6 cm. This could represent a
 hydrosalpinx or peritoneal inclusion cyst, although cystic ovarian
 mass cannot definitely be excluded.

 Left Ovary measures 7.8 x 4.9 x 6.2 cm. A simple appearing cyst is
 seen with no septations or mural nodularity, which measures
 centimeters in maximum diameter. This has benign sonographic
 features.

 Other Findings: No free fluid identified.
IMPRESSION: 1. 6.7 cm simple cystic lesion of the left ovary, which has benign
 characteristics but is suspicious for cystic ovarian neoplasm such
 as a serous cystadenoma in a postmenopausal female. Since these may
 be difficult to assess completely with US, further evaluation of
 simple-appearing cysts >7 cm with MRI or surgical evaluation is
 recommended according to the Society of Radiologists in Ultrasound
 9424 Consensus Conference Statement (Shamir Sakamoto et al. Management of
 Asymptomatic Ovarian and other Adnexal Cysts Imaged at US: Society
 of Radiologists in Ultrasound Consensus Conference Statement 9424.
 Radiology [DATE]): [id number].).
 2. 3 cm cystic lesion in the right adnexa abutting the medial
 aspect of the right ovary. This could represent a hydrosalpinx or
 peritoneal inclusion cyst, although cystic lesion of the right
 ovary cannot definitely be excluded. Further characterization
 could also be attempted with MRI.
 3. Posterior uterine fibroid measuring 2.3 cm.
 4. Abnormal post menopausal endometrial thickness of 11 mm, with
 probable tiny cystic foci. Differential diagnosis is endometrial
 hyperplasia versus endometrial carcinoma. Endometrial sampling is
 recommended.

## 2010-04-22 ENCOUNTER — Encounter: Payer: Self-pay | Admitting: Oncology

## 2010-04-22 ENCOUNTER — Encounter: Payer: Self-pay | Admitting: Family Medicine

## 2010-05-01 NOTE — Progress Notes (Signed)
Summary: Triage/ boil   Phone Note Call from Patient   Caller: Patient Reason for Call: Talk to Nurse Summary of Call: Patient states she has a boil on the left side of "my private parts.Marland Kitchenat the lips" she states the boil is very painful and that she can not sit and can not work. Patient advised that we do not have any appointments today and that she can go to urgent care and we will okay her visit since she has medicaid. Patient agreed. Initial call taken by: Conchita Paris,  November 07, 2009 9:30 AM

## 2010-05-17 ENCOUNTER — Other Ambulatory Visit: Payer: Self-pay | Admitting: Oncology

## 2010-05-17 ENCOUNTER — Encounter (HOSPITAL_BASED_OUTPATIENT_CLINIC_OR_DEPARTMENT_OTHER): Payer: Medicaid Other | Admitting: Oncology

## 2010-05-17 DIAGNOSIS — C50119 Malignant neoplasm of central portion of unspecified female breast: Secondary | ICD-10-CM

## 2010-05-17 LAB — COMPREHENSIVE METABOLIC PANEL
Albumin: 4.6 g/dL (ref 3.5–5.2)
Alkaline Phosphatase: 51 U/L (ref 39–117)
BUN: 11 mg/dL (ref 6–23)
Creatinine, Ser: 0.8 mg/dL (ref 0.40–1.20)
Glucose, Bld: 96 mg/dL (ref 70–99)
Total Bilirubin: 0.3 mg/dL (ref 0.3–1.2)

## 2010-05-17 LAB — CBC WITH DIFFERENTIAL/PLATELET
Basophils Absolute: 0.1 10*3/uL (ref 0.0–0.1)
Eosinophils Absolute: 0.1 10*3/uL (ref 0.0–0.5)
HGB: 13.4 g/dL (ref 11.6–15.9)
LYMPH%: 42 % (ref 14.0–49.7)
MCH: 33 pg (ref 25.1–34.0)
MCV: 95.1 fL (ref 79.5–101.0)
MONO%: 12.6 % (ref 0.0–14.0)
NEUT#: 1.9 10*3/uL (ref 1.5–6.5)
NEUT%: 41.9 % (ref 38.4–76.8)
Platelets: 330 10*3/uL (ref 145–400)

## 2010-05-24 ENCOUNTER — Other Ambulatory Visit: Payer: Self-pay | Admitting: Oncology

## 2010-05-24 ENCOUNTER — Encounter (HOSPITAL_BASED_OUTPATIENT_CLINIC_OR_DEPARTMENT_OTHER): Payer: Medicaid Other | Admitting: Oncology

## 2010-05-24 DIAGNOSIS — C50119 Malignant neoplasm of central portion of unspecified female breast: Secondary | ICD-10-CM

## 2010-05-24 DIAGNOSIS — N83209 Unspecified ovarian cyst, unspecified side: Secondary | ICD-10-CM

## 2010-05-24 DIAGNOSIS — Z170 Estrogen receptor positive status [ER+]: Secondary | ICD-10-CM

## 2010-05-24 DIAGNOSIS — C50919 Malignant neoplasm of unspecified site of unspecified female breast: Secondary | ICD-10-CM

## 2010-05-25 LAB — HEPATITIS B SURFACE ANTIGEN: Hepatitis B Surface Ag: NEGATIVE

## 2010-05-30 ENCOUNTER — Ambulatory Visit
Admission: RE | Admit: 2010-05-30 | Discharge: 2010-05-30 | Disposition: A | Discharge status: Home / Self Care | Payer: Medicaid Other | Source: Ambulatory Visit | Attending: Oncology | Admitting: Oncology

## 2010-05-30 DIAGNOSIS — C50919 Malignant neoplasm of unspecified site of unspecified female breast: Secondary | ICD-10-CM

## 2010-05-30 MED ORDER — GADOBENATE DIMEGLUMINE 529 MG/ML IV SOLN
18.0000 mL | Freq: Once | INTRAVENOUS | Status: AC | PRN
  Administered 2010-05-30: 18 mL via INTRAVENOUS

## 2010-06-15 LAB — CULTURE, ROUTINE-ABSCESS

## 2010-06-18 LAB — CBC
HCT: 36.3 % (ref 36.0–46.0)
Hemoglobin: 12.4 g/dL (ref 12.0–15.0)
MCHC: 34.1 g/dL (ref 30.0–36.0)
MCV: 92.5 fL (ref 78.0–100.0)
RBC: 3.92 MIL/uL (ref 3.87–5.11)
WBC: 4.4 10*3/uL (ref 4.0–10.5)

## 2010-06-18 LAB — DIFFERENTIAL
Basophils Relative: 0 % (ref 0–1)
Eosinophils Absolute: 0.1 10*3/uL (ref 0.0–0.7)
Eosinophils Relative: 3 % (ref 0–5)
Lymphs Abs: 1.1 10*3/uL (ref 0.7–4.0)
Monocytes Absolute: 0.6 10*3/uL (ref 0.1–1.0)
Monocytes Relative: 14 % — ABNORMAL HIGH (ref 3–12)
Neutrophils Relative %: 57 % (ref 43–77)

## 2010-06-18 LAB — BASIC METABOLIC PANEL
CO2: 25 mEq/L (ref 19–32)
Chloride: 99 mEq/L (ref 96–112)
Creatinine, Ser: 0.86 mg/dL (ref 0.4–1.2)
GFR calc Af Amer: >60 mL/min (ref >60)
Potassium: 3.2 mEq/L — ABNORMAL LOW (ref 3.5–5.1)

## 2010-06-21 LAB — CBC
HCT: 39.7 % (ref 36.0–46.0)
Hemoglobin: 13.8 g/dL (ref 12.0–15.0)
MCHC: 34.6 g/dL (ref 30.0–36.0)
MCV: 92.8 fL (ref 78.0–100.0)
Platelets: 313 10*3/uL (ref 150–400)
RDW: 14.7 % (ref 11.5–15.5)

## 2010-06-21 LAB — COMPREHENSIVE METABOLIC PANEL
Albumin: 4.1 g/dL (ref 3.5–5.2)
Alkaline Phosphatase: 63 U/L (ref 39–117)
BUN: 6 mg/dL (ref 6–23)
Creatinine, Ser: 0.86 mg/dL (ref 0.4–1.2)
Glucose, Bld: 100 mg/dL — ABNORMAL HIGH (ref 70–99)
Potassium: 3.3 mEq/L — ABNORMAL LOW (ref 3.5–5.1)
Total Bilirubin: 0.8 mg/dL (ref 0.3–1.2)
Total Protein: 7.9 g/dL (ref 6.0–8.3)

## 2010-06-21 LAB — URINALYSIS, ROUTINE W REFLEX MICROSCOPIC
Glucose, UA: NEGATIVE mg/dL
Ketones, ur: NEGATIVE mg/dL
Nitrite: NEGATIVE
Protein, ur: NEGATIVE mg/dL

## 2010-06-21 LAB — DIFFERENTIAL
Basophils Relative: 1 % (ref 0–1)
Eosinophils Absolute: 0.3 10*3/uL (ref 0.0–0.7)
Lymphs Abs: 3 10*3/uL (ref 0.7–4.0)
Monocytes Relative: 9 % (ref 3–12)
Neutro Abs: 2.5 10*3/uL (ref 1.7–7.7)
Neutrophils Relative %: 39 % — ABNORMAL LOW (ref 43–77)

## 2010-06-21 LAB — BILIRUBIN, DIRECT: Bilirubin, Direct: 0.2 mg/dL (ref 0.0–0.3)

## 2010-06-21 LAB — LACTATE DEHYDROGENASE: LDH: 170 U/L (ref 94–250)

## 2010-06-26 ENCOUNTER — Emergency Department (HOSPITAL_COMMUNITY)
Admission: EM | Admit: 2010-06-26 | Discharge: 2010-06-26 | Disposition: A | Discharge status: Home / Self Care | Payer: Medicaid Other | Attending: Emergency Medicine | Admitting: Emergency Medicine

## 2010-06-26 DIAGNOSIS — F10939 Alcohol use, unspecified with withdrawal, unspecified: Secondary | ICD-10-CM | POA: Insufficient documentation

## 2010-06-26 DIAGNOSIS — F10239 Alcohol dependence with withdrawal, unspecified: Secondary | ICD-10-CM | POA: Insufficient documentation

## 2010-06-26 DIAGNOSIS — I10 Essential (primary) hypertension: Secondary | ICD-10-CM | POA: Insufficient documentation

## 2010-06-26 DIAGNOSIS — F1020 Alcohol dependence, uncomplicated: Secondary | ICD-10-CM | POA: Insufficient documentation

## 2010-06-26 DIAGNOSIS — Z853 Personal history of malignant neoplasm of breast: Secondary | ICD-10-CM | POA: Insufficient documentation

## 2010-06-26 LAB — BASIC METABOLIC PANEL
CO2: 27 mEq/L (ref 19–32)
Calcium: 9.8 mg/dL (ref 8.4–10.5)
Creatinine, Ser: 1.12 mg/dL (ref 0.4–1.2)
GFR calc Af Amer: >60 mL/min (ref >60)
GFR calc non Af Amer: 50 mL/min — ABNORMAL LOW (ref >60)
Sodium: 137 mEq/L (ref 135–145)

## 2010-06-26 LAB — CBC
Platelets: 267 10*3/uL (ref 150–400)
RBC: 4.16 MIL/uL (ref 3.87–5.11)
RDW: 13.9 % (ref 11.5–15.5)
WBC: 5.1 10*3/uL (ref 4.0–10.5)

## 2010-06-26 LAB — DIFFERENTIAL
Basophils Absolute: 0 10*3/uL (ref 0.0–0.1)
Basophils Relative: 1 % (ref 0–1)
Eosinophils Absolute: 0 10*3/uL (ref 0.0–0.7)
Eosinophils Relative: 0 % (ref 0–5)
Neutrophils Relative %: 64 % (ref 43–77)

## 2010-06-26 LAB — RAPID URINE DRUG SCREEN, HOSP PERFORMED
Amphetamines: NOT DETECTED
Barbiturates: NOT DETECTED
Benzodiazepines: NOT DETECTED
Tetrahydrocannabinol: POSITIVE — AB

## 2010-06-26 LAB — URINALYSIS, ROUTINE W REFLEX MICROSCOPIC
Hgb urine dipstick: NEGATIVE
Nitrite: NEGATIVE
Protein, ur: 30 mg/dL — AB
Urobilinogen, UA: 1 mg/dL (ref 0.0–1.0)

## 2010-06-26 LAB — GLUCOSE, CAPILLARY: Glucose-Capillary: 108 mg/dL — ABNORMAL HIGH (ref 70–99)

## 2010-06-26 LAB — ETHANOL: Alcohol, Ethyl (B): <5 mg/dL (ref 0–10)

## 2010-06-26 LAB — URINE MICROSCOPIC-ADD ON

## 2010-08-17 NOTE — Group Therapy Note (Signed)
Carrie [name], Carrie [name]                   ACCOUNT NO.:  [id number]   MEDICAL RECORD NO.:  [id number]          PATIENT TYPE:  WOC   LOCATION:  [facility]                   FACILITY:  [facility]   PHYSICIAN:  Elsie Lincoln, MD      DATE OF BIRTH:  December 16, 1950   DATE OF SERVICE:  09/04/2005                                    CLINIC NOTE   HISTORY OF PRESENT ILLNESS:  The patient is a 60 year old nulliparous  menopausal female who was sent to [place] from [facility] for evaluation of  possible Bartholin's gland cysts.  The patient says they do not bother her,  they are not painful, but she does want to know what they are.   PAST MEDICAL HISTORY:  1.  GERD.  2.  Arthritis.   PAST SURGICAL HISTORY:  She denies.   OBSTETRICAL HISTORY:  Nulliparous.   MENSTRUAL HISTORY:  Menopausal 3-4 years.   FAMILY HISTORY:  Mother has diabetes.   REVIEW OF SYSTEMS:  The patient does have hot flashes and night sweats from  her menopause and she also complains of blood in her stool.   PHYSICAL EXAMINATION:  PELVIC:  Tanner V.  There are 2 inclusion cysts, 1 in  the right labia majora measuring 3 x 1 cm and 1 the perineum measuring 2 x 2  cm; they are mobile and nontender.  These are no lipomas, mostly likely  inclusion cysts.   ASSESSMENT:  A 60 year old female with inclusion cysts.   PLAN:  1.  No need to remove these, unless they bother the patient.  2.  If they start to bother the patient, she can make an appointment in our      clinic.  3.  Followup with [facility] for blood in the stools.  4.  The patient is up to date on Pap smear and mammogram.  I do have a copy      of the Pap smear, but I do not have a copy of the mammogram.           ______________________________  Elsie Lincoln, MD     KL/MEDQ  D:  09/04/2005  T:  09/05/2005  Job:  [id number]   cc:   Dala Dock

## 2010-09-12 ENCOUNTER — Encounter (HOSPITAL_BASED_OUTPATIENT_CLINIC_OR_DEPARTMENT_OTHER): Payer: Medicaid Other | Admitting: Oncology

## 2010-09-12 ENCOUNTER — Other Ambulatory Visit: Payer: Self-pay | Admitting: Oncology

## 2010-09-12 DIAGNOSIS — C50119 Malignant neoplasm of central portion of unspecified female breast: Secondary | ICD-10-CM

## 2010-09-12 DIAGNOSIS — N83209 Unspecified ovarian cyst, unspecified side: Secondary | ICD-10-CM

## 2010-09-12 LAB — COMPREHENSIVE METABOLIC PANEL
ALT: 42 U/L — ABNORMAL HIGH (ref 0–35)
AST: 47 U/L — ABNORMAL HIGH (ref 0–37)
Alkaline Phosphatase: 64 U/L (ref 39–117)
Sodium: 136 mEq/L (ref 135–145)
Total Bilirubin: 0.5 mg/dL (ref 0.3–1.2)
Total Protein: 7.6 g/dL (ref 6.0–8.3)

## 2010-09-12 LAB — CBC WITH DIFFERENTIAL/PLATELET
BASO%: 0.5 % (ref 0.0–2.0)
EOS%: 0.6 % (ref 0.0–7.0)
LYMPH%: 29.6 % (ref 14.0–49.7)
MCHC: 35.1 g/dL (ref 31.5–36.0)
MCV: 96.8 fL (ref 79.5–101.0)
MONO%: 15.1 % — ABNORMAL HIGH (ref 0.0–14.0)
Platelets: 301 10*3/uL (ref 145–400)
RBC: 4.16 10*6/uL (ref 3.70–5.45)
RDW: 14.4 % (ref 11.2–14.5)
WBC: 5.5 10*3/uL (ref 3.9–10.3)

## 2010-09-18 ENCOUNTER — Encounter (HOSPITAL_BASED_OUTPATIENT_CLINIC_OR_DEPARTMENT_OTHER): Payer: Medicaid Other | Admitting: Oncology

## 2010-09-18 DIAGNOSIS — N83209 Unspecified ovarian cyst, unspecified side: Secondary | ICD-10-CM

## 2010-09-18 DIAGNOSIS — C50919 Malignant neoplasm of unspecified site of unspecified female breast: Secondary | ICD-10-CM

## 2010-09-19 ENCOUNTER — Other Ambulatory Visit: Payer: Self-pay | Admitting: Oncology

## 2010-09-19 DIAGNOSIS — C50919 Malignant neoplasm of unspecified site of unspecified female breast: Secondary | ICD-10-CM

## 2010-09-19 DIAGNOSIS — R0602 Shortness of breath: Secondary | ICD-10-CM

## 2010-09-19 DIAGNOSIS — R059 Cough, unspecified: Secondary | ICD-10-CM

## 2010-09-19 DIAGNOSIS — R05 Cough: Secondary | ICD-10-CM

## 2010-09-21 ENCOUNTER — Ambulatory Visit (HOSPITAL_COMMUNITY)
Admission: RE | Admit: 2010-09-21 | Discharge: 2010-09-21 | Disposition: A | Discharge status: Home / Self Care | Payer: Medicaid Other | Source: Ambulatory Visit | Attending: Oncology | Admitting: Oncology

## 2010-09-21 DIAGNOSIS — C50919 Malignant neoplasm of unspecified site of unspecified female breast: Secondary | ICD-10-CM | POA: Insufficient documentation

## 2010-09-21 DIAGNOSIS — R634 Abnormal weight loss: Secondary | ICD-10-CM | POA: Insufficient documentation

## 2010-09-21 DIAGNOSIS — R0602 Shortness of breath: Secondary | ICD-10-CM | POA: Insufficient documentation

## 2010-09-21 DIAGNOSIS — K7689 Other specified diseases of liver: Secondary | ICD-10-CM | POA: Insufficient documentation

## 2010-09-21 DIAGNOSIS — D1803 Hemangioma of intra-abdominal structures: Secondary | ICD-10-CM | POA: Insufficient documentation

## 2010-09-21 DIAGNOSIS — R05 Cough: Secondary | ICD-10-CM

## 2010-09-21 DIAGNOSIS — R059 Cough, unspecified: Secondary | ICD-10-CM | POA: Insufficient documentation

## 2010-09-21 MED ORDER — IOHEXOL 300 MG/ML  SOLN
80.0000 mL | Freq: Once | INTRAMUSCULAR | Status: AC | PRN
  Administered 2010-09-21: 80 mL via INTRAVENOUS

## 2010-10-04 ENCOUNTER — Other Ambulatory Visit: Payer: Self-pay | Admitting: Gastroenterology

## 2010-10-05 ENCOUNTER — Ambulatory Visit
Admission: RE | Admit: 2010-10-05 | Discharge: 2010-10-05 | Disposition: A | Discharge status: Home / Self Care | Payer: Medicaid Other | Source: Ambulatory Visit | Attending: Gastroenterology | Admitting: Gastroenterology

## 2010-10-10 ENCOUNTER — Other Ambulatory Visit: Payer: Self-pay | Admitting: Oncology

## 2010-10-10 ENCOUNTER — Encounter (HOSPITAL_BASED_OUTPATIENT_CLINIC_OR_DEPARTMENT_OTHER): Payer: Medicaid Other | Admitting: Oncology

## 2010-10-10 DIAGNOSIS — C50119 Malignant neoplasm of central portion of unspecified female breast: Secondary | ICD-10-CM

## 2010-10-10 DIAGNOSIS — Z170 Estrogen receptor positive status [ER+]: Secondary | ICD-10-CM

## 2010-10-10 DIAGNOSIS — N83209 Unspecified ovarian cyst, unspecified side: Secondary | ICD-10-CM

## 2010-10-10 LAB — COMPREHENSIVE METABOLIC PANEL
ALT: 30 U/L (ref 0–35)
Albumin: 3.7 g/dL (ref 3.5–5.2)
CO2: 40 mEq/L (ref 19–32)
Potassium: 2.4 mEq/L — CL (ref 3.5–5.3)
Sodium: 131 mEq/L — ABNORMAL LOW (ref 135–145)
Total Bilirubin: 0.3 mg/dL (ref 0.3–1.2)
Total Protein: 7.6 g/dL (ref 6.0–8.3)

## 2010-10-10 LAB — CBC WITH DIFFERENTIAL/PLATELET
BASO%: 0.3 % (ref 0.0–2.0)
LYMPH%: 33.9 % (ref 14.0–49.7)
MCHC: 34.9 g/dL (ref 31.5–36.0)
MONO#: 0.5 10*3/uL (ref 0.1–0.9)
NEUT#: 3 10*3/uL (ref 1.5–6.5)
RBC: 3.76 10*6/uL (ref 3.70–5.45)
RDW: 12.8 % (ref 11.2–14.5)
WBC: 5.3 10*3/uL (ref 3.9–10.3)
lymph#: 1.8 10*3/uL (ref 0.9–3.3)

## 2010-10-10 LAB — CANCER ANTIGEN 27.29: CA 27.29: 97 U/mL — ABNORMAL HIGH (ref 0–39)

## 2010-10-11 ENCOUNTER — Encounter (HOSPITAL_BASED_OUTPATIENT_CLINIC_OR_DEPARTMENT_OTHER): Payer: Medicaid Other | Admitting: Oncology

## 2010-10-11 ENCOUNTER — Other Ambulatory Visit: Payer: Self-pay | Admitting: Physician Assistant

## 2010-10-11 DIAGNOSIS — C50119 Malignant neoplasm of central portion of unspecified female breast: Secondary | ICD-10-CM

## 2010-10-11 DIAGNOSIS — Z170 Estrogen receptor positive status [ER+]: Secondary | ICD-10-CM

## 2010-10-11 DIAGNOSIS — N83209 Unspecified ovarian cyst, unspecified side: Secondary | ICD-10-CM

## 2010-10-11 LAB — BASIC METABOLIC PANEL
CO2: 38 mEq/L — ABNORMAL HIGH (ref 19–32)
Calcium: 10.1 mg/dL (ref 8.4–10.5)
Creatinine, Ser: 0.91 mg/dL (ref 0.50–1.10)
Glucose, Bld: 118 mg/dL — ABNORMAL HIGH (ref 70–99)

## 2010-10-15 ENCOUNTER — Other Ambulatory Visit: Payer: Self-pay | Admitting: Oncology

## 2010-10-15 ENCOUNTER — Encounter (HOSPITAL_BASED_OUTPATIENT_CLINIC_OR_DEPARTMENT_OTHER): Payer: Medicaid Other | Admitting: Oncology

## 2010-10-15 DIAGNOSIS — C50119 Malignant neoplasm of central portion of unspecified female breast: Secondary | ICD-10-CM

## 2010-10-15 LAB — BASIC METABOLIC PANEL
Calcium: 10 mg/dL (ref 8.4–10.5)
Chloride: 88 mEq/L — ABNORMAL LOW (ref 96–112)
Creatinine, Ser: 0.79 mg/dL (ref 0.50–1.10)

## 2010-10-18 ENCOUNTER — Encounter (HOSPITAL_BASED_OUTPATIENT_CLINIC_OR_DEPARTMENT_OTHER): Payer: Medicaid Other | Admitting: Oncology

## 2010-10-18 ENCOUNTER — Other Ambulatory Visit: Payer: Self-pay | Admitting: Oncology

## 2010-10-18 DIAGNOSIS — C50119 Malignant neoplasm of central portion of unspecified female breast: Secondary | ICD-10-CM

## 2010-10-18 DIAGNOSIS — N83209 Unspecified ovarian cyst, unspecified side: Secondary | ICD-10-CM

## 2010-10-18 DIAGNOSIS — Z170 Estrogen receptor positive status [ER+]: Secondary | ICD-10-CM

## 2010-10-18 LAB — BASIC METABOLIC PANEL
Chloride: 87 mEq/L — ABNORMAL LOW (ref 96–112)
Potassium: 2.9 mEq/L — ABNORMAL LOW (ref 3.5–5.3)

## 2010-10-24 ENCOUNTER — Other Ambulatory Visit (HOSPITAL_COMMUNITY)
Admission: RE | Admit: 2010-10-24 | Discharge: 2010-10-24 | Disposition: A | Discharge status: Home / Self Care | Payer: Medicaid Other | Source: Ambulatory Visit | Attending: Gynecologic Oncology | Admitting: Gynecologic Oncology

## 2010-10-24 ENCOUNTER — Ambulatory Visit: Payer: Medicaid Other | Attending: Gynecologic Oncology | Admitting: Gynecologic Oncology

## 2010-10-24 ENCOUNTER — Other Ambulatory Visit: Payer: Self-pay | Admitting: Gynecologic Oncology

## 2010-10-24 DIAGNOSIS — Z8540 Personal history of malignant neoplasm of unspecified female genital organ: Secondary | ICD-10-CM | POA: Insufficient documentation

## 2010-10-24 DIAGNOSIS — N949 Unspecified condition associated with female genital organs and menstrual cycle: Secondary | ICD-10-CM | POA: Insufficient documentation

## 2010-10-24 DIAGNOSIS — I10 Essential (primary) hypertension: Secondary | ICD-10-CM | POA: Insufficient documentation

## 2010-10-24 DIAGNOSIS — N839 Noninflammatory disorder of ovary, fallopian tube and broad ligament, unspecified: Secondary | ICD-10-CM | POA: Insufficient documentation

## 2010-10-24 DIAGNOSIS — K219 Gastro-esophageal reflux disease without esophagitis: Secondary | ICD-10-CM | POA: Insufficient documentation

## 2010-10-24 DIAGNOSIS — R110 Nausea: Secondary | ICD-10-CM | POA: Insufficient documentation

## 2010-10-24 DIAGNOSIS — Z853 Personal history of malignant neoplasm of breast: Secondary | ICD-10-CM | POA: Insufficient documentation

## 2010-10-25 ENCOUNTER — Other Ambulatory Visit: Payer: Self-pay | Admitting: Oncology

## 2010-10-25 ENCOUNTER — Encounter (HOSPITAL_BASED_OUTPATIENT_CLINIC_OR_DEPARTMENT_OTHER): Payer: Medicaid Other | Admitting: Oncology

## 2010-10-25 DIAGNOSIS — N83209 Unspecified ovarian cyst, unspecified side: Secondary | ICD-10-CM

## 2010-10-25 DIAGNOSIS — Z170 Estrogen receptor positive status [ER+]: Secondary | ICD-10-CM

## 2010-10-25 DIAGNOSIS — C50119 Malignant neoplasm of central portion of unspecified female breast: Secondary | ICD-10-CM

## 2010-10-25 LAB — BASIC METABOLIC PANEL
Potassium: 2.9 mEq/L — ABNORMAL LOW (ref 3.5–5.3)
Sodium: 132 mEq/L — ABNORMAL LOW (ref 135–145)

## 2010-10-25 NOTE — Consult Note (Signed)
Carrie [name], Carrie [name]                   ACCOUNT NO.:  [id number]  MEDICAL RECORD NO.:  [id number]  LOCATION:  GYN                          FACILITY:  [facility]  PHYSICIAN:  Laurette Schimke, MD     DATE OF BIRTH:  14-Nov-1950  DATE OF CONSULTATION:  10/24/2010 DATE OF DISCHARGE:                                CONSULTATION   REASON FOR VISIT:  Pelvic pain and pelvic mass.  HISTORY OF PRESENT ILLNESS:  This is a lovely 60 year old with an invasive ductal carcinoma, status post left lumpectomy and sentinel node biopsy.  She was initially referred to GYN-oncology service for evaluation of endometrial polyp and ovarian cyst.  She underwent a D and C, which was unremarkable and she has had a stable left ovarian cyst. Most recent imaging on December 2011 demonstrated a stable 7-cm left ovarian cyst fear for a serous inclusion cyst versus a serous cystadenoma.  Carrie [name] states that she has reported to Dr. Darnelle Catalan complaints of severe left-sided pelvic pain associated with intermittent nausea.  She denies any uterine bleeding.  At that time when there is pain, she appears motivated to have this surgically managed; however, when she is asymptomatic, patient is very reluctant regarding surgical intervention.  She denies any abdominal bloating, bleeding from the rectum, bladder or vagina.  PAST MEDICAL HISTORY: 1. Breast cancer. 2. Gastroesophageal reflux disease. 3. Hypertension. 4. Symptomatic left ovarian cyst.  PAST SURGICAL HISTORY: 1. Breast biopsy. 2. Finger surgery. 3. Sentinel node dissection. 4. Uterine dilatation and curettage in May 2011.  SOCIAL HISTORY:  Patient denies tobacco use.  She is employed as a Advertising copywriter.  REVIEW OF SYSTEMS:  Intermittent pelvic pain associated with nausea, quite severe when it occurs.  No diarrhea, constipation.  Reports significant vaginal dryness, occasional vasomotor symptoms.  PHYSICAL EXAMINATION:  GENERAL:  Well-developed female, in no  acute distress. VITAL SIGNS:  Weight 171 pounds, height 5 feet, blood pressure 140/90, temperature 97.3. CHEST:  Clear to auscultation. ABDOMEN:  Soft, obese.  No palpable masses, nontender. BACK:  No CVA tenderness. PELVIC EXAMINATION:  Normal external genitalia, Bartholin's, urethra and Skene's.  Normal-appearing cervix.  Pap test was collected.  No nodularity within the cul-de-sac.  No cervical motion tenderness. BACK:  No CVA tenderness. RECTAL EXAMINATION:  Good anal sphincter tone without any masses.  IMPRESSION:  Carrie [name] has a stable, but symptomatic adnexal cyst.  The patient was informed her options are: 1. Continued observation. 2. Minimally invasive bilateral salpingo-oophorectomy or hysterectomy.  Ms. Torosyan appears to be leaning more towards the option associated with the least intervention.  She is aware that we are available and willing to do the procedure in a minimally invasive approach, which would be a bilateral salpingo-oophorectomy at minimum.  She will consider options. I have advised her to follow up p.r.n. the pain or in 1 year.     Laurette Schimke, MD     WB/MEDQ  D:  10/24/2010  T:  10/24/2010  Job:  [id number]  cc:   Lowella Dell, M.D. Fax: [phone]  Telford Nab, R.N. [address] [place], [place] [address]  Electronically Signed by Laurette Schimke MD on 10/25/2010  10:56:06 AM

## 2010-10-29 ENCOUNTER — Other Ambulatory Visit: Payer: Self-pay | Admitting: Oncology

## 2010-10-29 ENCOUNTER — Encounter (HOSPITAL_BASED_OUTPATIENT_CLINIC_OR_DEPARTMENT_OTHER): Payer: Medicaid Other | Admitting: Oncology

## 2010-10-29 DIAGNOSIS — N83209 Unspecified ovarian cyst, unspecified side: Secondary | ICD-10-CM

## 2010-10-29 DIAGNOSIS — Z170 Estrogen receptor positive status [ER+]: Secondary | ICD-10-CM

## 2010-10-29 DIAGNOSIS — C50119 Malignant neoplasm of central portion of unspecified female breast: Secondary | ICD-10-CM

## 2010-10-29 LAB — COMPREHENSIVE METABOLIC PANEL
Albumin: 3.5 g/dL (ref 3.5–5.2)
Alkaline Phosphatase: 61 U/L (ref 39–117)
CO2: 29 mEq/L (ref 19–32)
Calcium: 9.8 mg/dL (ref 8.4–10.5)
Chloride: 93 mEq/L — ABNORMAL LOW (ref 96–112)
Glucose, Bld: 102 mg/dL — ABNORMAL HIGH (ref 70–99)
Potassium: 2.9 mEq/L — ABNORMAL LOW (ref 3.5–5.3)
Sodium: 136 mEq/L (ref 135–145)
Total Protein: 7.8 g/dL (ref 6.0–8.3)

## 2010-10-29 LAB — MAGNESIUM: Magnesium: 1.8 mg/dL (ref 1.5–2.5)

## 2010-10-31 ENCOUNTER — Encounter (HOSPITAL_BASED_OUTPATIENT_CLINIC_OR_DEPARTMENT_OTHER): Payer: Medicaid Other | Admitting: Oncology

## 2010-10-31 DIAGNOSIS — C50119 Malignant neoplasm of central portion of unspecified female breast: Secondary | ICD-10-CM

## 2010-10-31 DIAGNOSIS — E876 Hypokalemia: Secondary | ICD-10-CM

## 2010-11-01 ENCOUNTER — Encounter (HOSPITAL_BASED_OUTPATIENT_CLINIC_OR_DEPARTMENT_OTHER): Payer: Medicaid Other | Admitting: Oncology

## 2010-11-01 DIAGNOSIS — E876 Hypokalemia: Secondary | ICD-10-CM

## 2010-11-01 DIAGNOSIS — C50119 Malignant neoplasm of central portion of unspecified female breast: Secondary | ICD-10-CM

## 2010-12-02 ENCOUNTER — Inpatient Hospital Stay (INDEPENDENT_AMBULATORY_CARE_PROVIDER_SITE_OTHER)
Admission: RE | Admit: 2010-12-02 | Discharge: 2010-12-02 | Disposition: A | Discharge status: Home / Self Care | Payer: Medicaid Other | Source: Ambulatory Visit | Attending: Family Medicine | Admitting: Family Medicine

## 2010-12-02 DIAGNOSIS — S139XXA Sprain of joints and ligaments of unspecified parts of neck, initial encounter: Secondary | ICD-10-CM

## 2010-12-02 DIAGNOSIS — S335XXA Sprain of ligaments of lumbar spine, initial encounter: Secondary | ICD-10-CM

## 2010-12-06 ENCOUNTER — Encounter (HOSPITAL_BASED_OUTPATIENT_CLINIC_OR_DEPARTMENT_OTHER): Payer: Medicaid Other | Admitting: Oncology

## 2010-12-06 ENCOUNTER — Other Ambulatory Visit: Payer: Self-pay | Admitting: Oncology

## 2010-12-06 DIAGNOSIS — N83209 Unspecified ovarian cyst, unspecified side: Secondary | ICD-10-CM

## 2010-12-06 DIAGNOSIS — Z170 Estrogen receptor positive status [ER+]: Secondary | ICD-10-CM

## 2010-12-06 DIAGNOSIS — C50119 Malignant neoplasm of central portion of unspecified female breast: Secondary | ICD-10-CM

## 2010-12-06 DIAGNOSIS — C50919 Malignant neoplasm of unspecified site of unspecified female breast: Secondary | ICD-10-CM

## 2010-12-06 LAB — COMPREHENSIVE METABOLIC PANEL
Alkaline Phosphatase: 59 U/L (ref 39–117)
Glucose, Bld: 84 mg/dL (ref 70–99)
Sodium: 132 mEq/L — ABNORMAL LOW (ref 135–145)
Total Bilirubin: 0.5 mg/dL (ref 0.3–1.2)
Total Protein: 8.2 g/dL (ref 6.0–8.3)

## 2010-12-06 IMAGING — US US TRANSVAGINAL NON-OB
1 series · 13 of 25 positions shown · non-contrast
Comparison: 09/22/2009, CT pelvis 05/26/2009.

CLINICAL DATA: 59-year-old female with cystic ovarian lesions
discovered during imaging workup of breast cancer.



[Series 1: us transvaginal non-ob · 0.28mm/px · 13 of 53 slices shown]
[im 1/53]
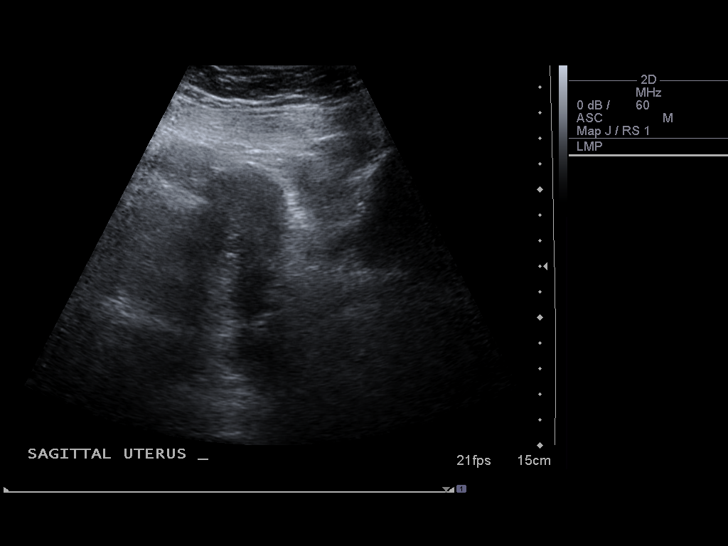
[im 5/53]
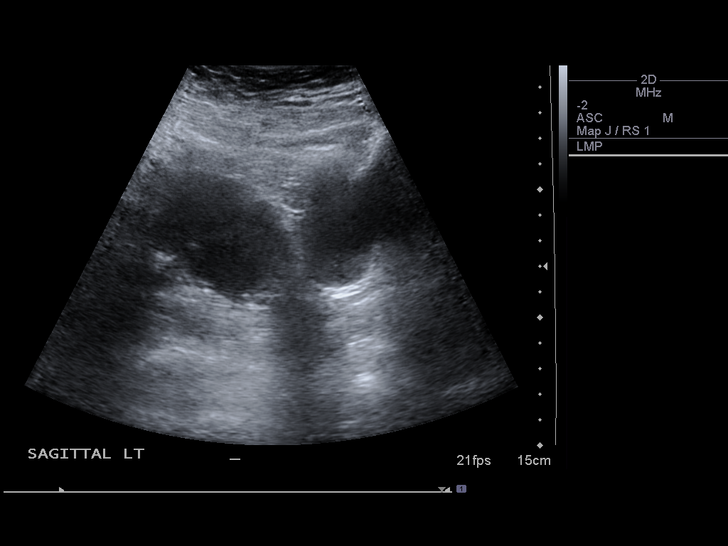
[im 9/53]
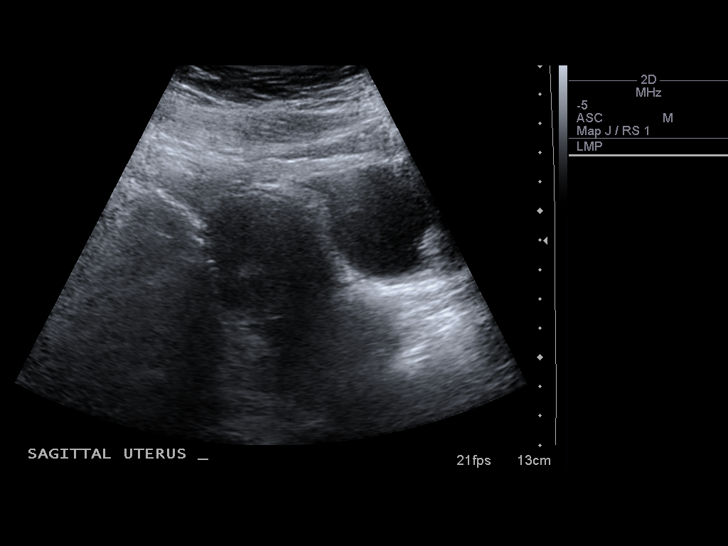
[im 14/53]
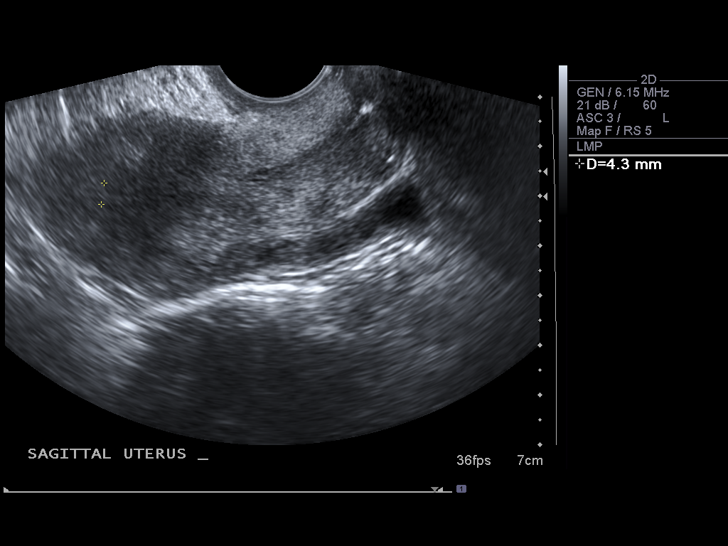
[im 18/53]
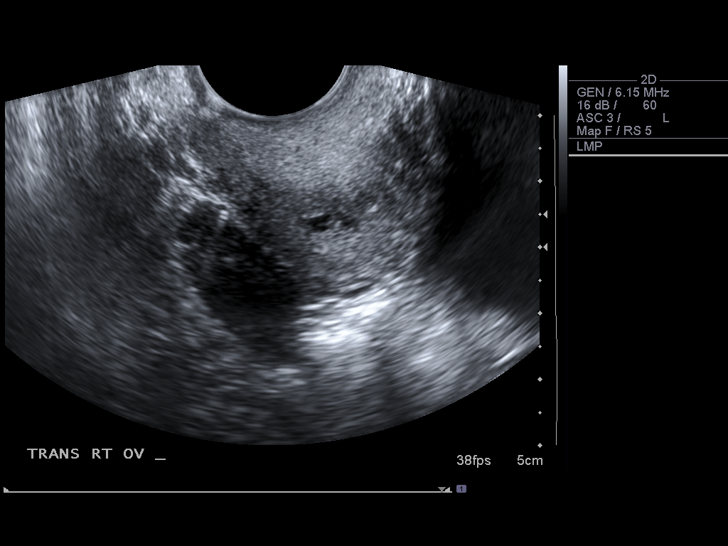
[im 22/53]
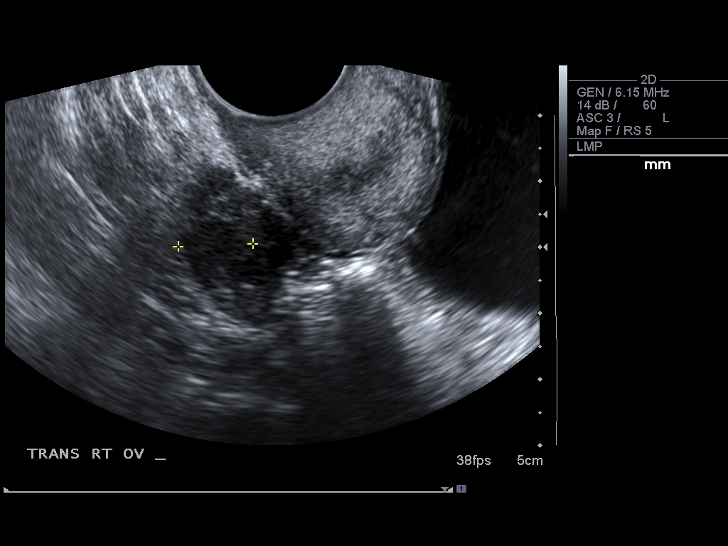
[im 27/53]
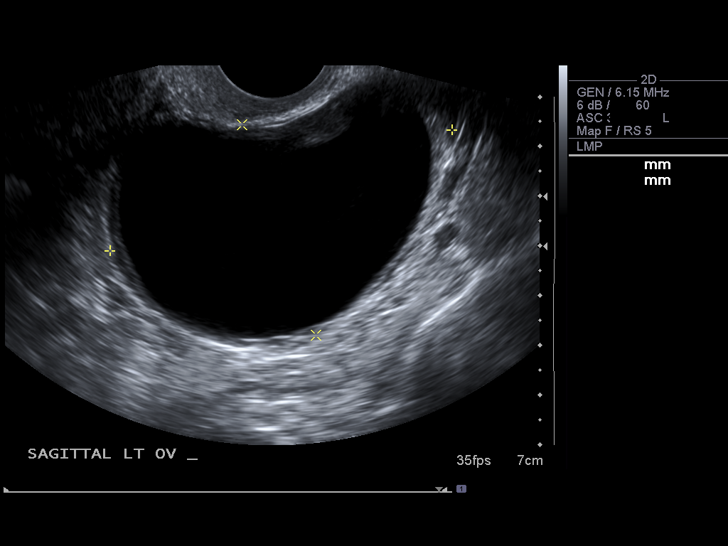
[im 31/53]
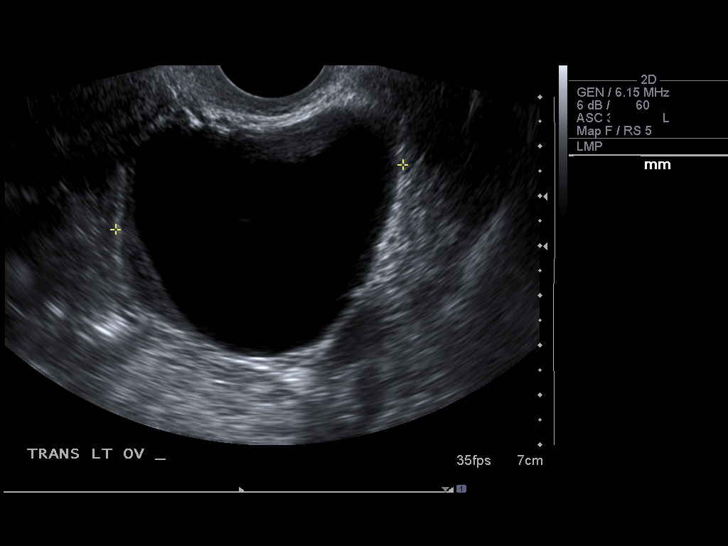
[im 35/53]
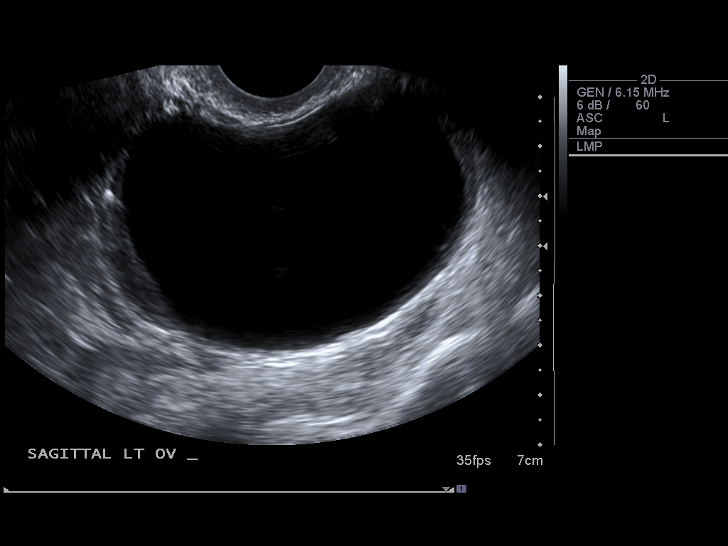
[im 40/53]
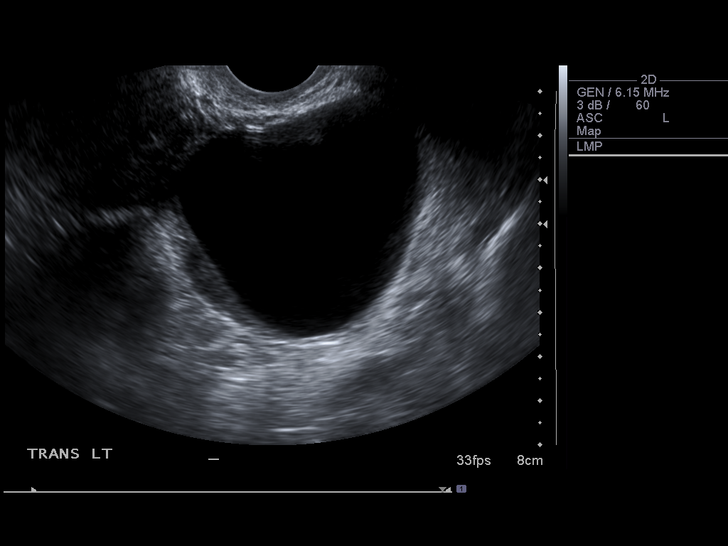
[im 44/53]
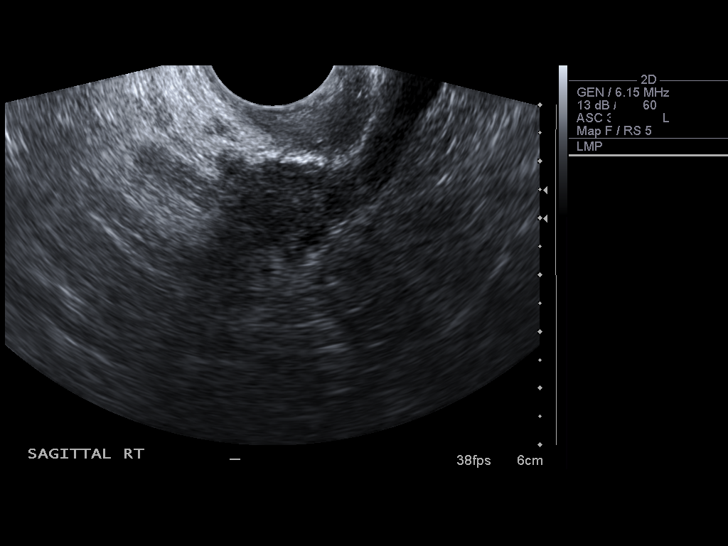
[im 48/53]
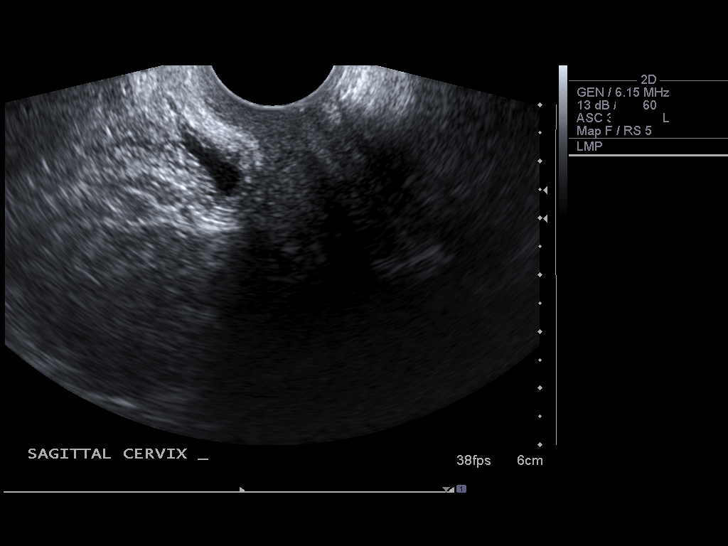
[im 53/53]
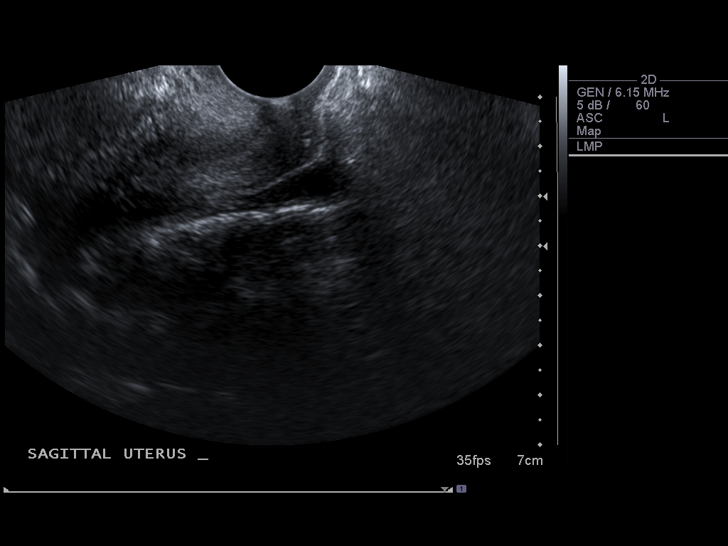

[13 of 25 positions shown; findings below may reference images not displayed]

FINDINGS: Uterus is within normal limits measuring 7.1 x 4.2 x 4.1 cm.

Endometrium is within normal limits measuring 4 mm in thickness.

Right Ovary overall measures 2.6 x 1.8 x 1.9 area.  There is a
hypoechoic somewhat complex area measuring 1.2 x 0.9 x 1.1 cm,
significantly regressed from the prior studies at which time a 3 cm
hypoechoic/cystic lesion was identified.

Left Ovary measures 7.3 x 4.5 x 5.90 contains a large simple
appearing cystic structure measuring 6.8 x 4.2 x 5.6 cm (stable).
Color Doppler interrogation of the wall demonstrate no vascularity.

Other Findings:  Small volume pelvic free fluid.
IMPRESSION: 1.  Regression of the right ovarian cystic lesion.
2.  Stable roughly 7 cm left ovarian cyst since 05/26/2009.  Favor
a serous inclusion cyst versus cystadenoma.

## 2010-12-07 ENCOUNTER — Encounter (HOSPITAL_BASED_OUTPATIENT_CLINIC_OR_DEPARTMENT_OTHER): Payer: Medicaid Other | Admitting: Oncology

## 2010-12-07 DIAGNOSIS — C50119 Malignant neoplasm of central portion of unspecified female breast: Secondary | ICD-10-CM

## 2010-12-07 DIAGNOSIS — E876 Hypokalemia: Secondary | ICD-10-CM

## 2010-12-07 DIAGNOSIS — Z170 Estrogen receptor positive status [ER+]: Secondary | ICD-10-CM

## 2010-12-10 ENCOUNTER — Encounter (HOSPITAL_BASED_OUTPATIENT_CLINIC_OR_DEPARTMENT_OTHER): Payer: Medicaid Other | Admitting: Oncology

## 2010-12-10 ENCOUNTER — Other Ambulatory Visit: Payer: Self-pay | Admitting: Physician Assistant

## 2010-12-10 DIAGNOSIS — C50919 Malignant neoplasm of unspecified site of unspecified female breast: Secondary | ICD-10-CM

## 2010-12-10 DIAGNOSIS — Z170 Estrogen receptor positive status [ER+]: Secondary | ICD-10-CM

## 2010-12-10 DIAGNOSIS — N83209 Unspecified ovarian cyst, unspecified side: Secondary | ICD-10-CM

## 2010-12-10 DIAGNOSIS — C50119 Malignant neoplasm of central portion of unspecified female breast: Secondary | ICD-10-CM

## 2010-12-10 LAB — COMPREHENSIVE METABOLIC PANEL
Albumin: 4.3 g/dL (ref 3.5–5.2)
Alkaline Phosphatase: 47 U/L (ref 39–117)
BUN: 14 mg/dL (ref 6–23)
Calcium: 10.3 mg/dL (ref 8.4–10.5)
Glucose, Bld: 109 mg/dL — ABNORMAL HIGH (ref 70–99)
Potassium: 4.4 mEq/L (ref 3.5–5.3)

## 2010-12-17 ENCOUNTER — Other Ambulatory Visit: Payer: Self-pay | Admitting: Oncology

## 2010-12-17 ENCOUNTER — Encounter: Payer: Medicaid Other | Admitting: Oncology

## 2010-12-17 LAB — COMPREHENSIVE METABOLIC PANEL
ALT: 22 U/L (ref 0–35)
AST: 24 U/L (ref 0–37)
Albumin: 3.8 g/dL (ref 3.5–5.2)
Calcium: 10.5 mg/dL (ref 8.4–10.5)
Chloride: 90 mEq/L — ABNORMAL LOW (ref 96–112)
Potassium: 3.3 mEq/L — ABNORMAL LOW (ref 3.5–5.3)
Total Protein: 7.7 g/dL (ref 6.0–8.3)

## 2010-12-24 ENCOUNTER — Encounter (HOSPITAL_BASED_OUTPATIENT_CLINIC_OR_DEPARTMENT_OTHER): Payer: Medicaid Other | Admitting: Oncology

## 2010-12-24 ENCOUNTER — Other Ambulatory Visit: Payer: Self-pay | Admitting: Oncology

## 2010-12-24 DIAGNOSIS — N83209 Unspecified ovarian cyst, unspecified side: Secondary | ICD-10-CM

## 2010-12-24 DIAGNOSIS — C50919 Malignant neoplasm of unspecified site of unspecified female breast: Secondary | ICD-10-CM

## 2010-12-24 DIAGNOSIS — C50119 Malignant neoplasm of central portion of unspecified female breast: Secondary | ICD-10-CM

## 2010-12-24 DIAGNOSIS — Z170 Estrogen receptor positive status [ER+]: Secondary | ICD-10-CM

## 2010-12-24 LAB — COMPREHENSIVE METABOLIC PANEL
ALT: 23 U/L (ref 0–35)
AST: 25 U/L (ref 0–37)
Calcium: 9.9 mg/dL (ref 8.4–10.5)
Chloride: 91 mEq/L — ABNORMAL LOW (ref 96–112)
Creatinine, Ser: 0.83 mg/dL (ref 0.50–1.10)

## 2011-01-11 ENCOUNTER — Other Ambulatory Visit: Payer: Self-pay | Admitting: Oncology

## 2011-01-11 ENCOUNTER — Encounter (HOSPITAL_BASED_OUTPATIENT_CLINIC_OR_DEPARTMENT_OTHER): Payer: Medicaid Other | Admitting: Oncology

## 2011-01-11 DIAGNOSIS — C50119 Malignant neoplasm of central portion of unspecified female breast: Secondary | ICD-10-CM

## 2011-01-11 DIAGNOSIS — N83209 Unspecified ovarian cyst, unspecified side: Secondary | ICD-10-CM

## 2011-01-11 DIAGNOSIS — Z170 Estrogen receptor positive status [ER+]: Secondary | ICD-10-CM

## 2011-01-11 DIAGNOSIS — C50919 Malignant neoplasm of unspecified site of unspecified female breast: Secondary | ICD-10-CM

## 2011-01-11 LAB — CBC WITH DIFFERENTIAL/PLATELET
BASO%: 0.6 % (ref 0.0–2.0)
EOS%: 3.8 % (ref 0.0–7.0)
HCT: 36.4 % (ref 34.8–46.6)
LYMPH%: 40.9 % (ref 14.0–49.7)
MCH: 33.7 pg (ref 25.1–34.0)
MCHC: 35.2 g/dL (ref 31.5–36.0)
MONO%: 10.8 % (ref 0.0–14.0)
NEUT%: 43.9 % (ref 38.4–76.8)
Platelets: 350 10*3/uL (ref 145–400)
lymph#: 1.8 10*3/uL (ref 0.9–3.3)

## 2011-01-12 LAB — BASIC METABOLIC PANEL
BUN: 11 mg/dL (ref 6–23)
Calcium: 9.1 mg/dL (ref 8.4–10.5)
Chloride: 99 mEq/L (ref 96–112)
Creatinine, Ser: 0.8 mg/dL (ref 0.50–1.10)

## 2011-01-12 LAB — VITAMIN D 25 HYDROXY (VIT D DEFICIENCY, FRACTURES): Vit D, 25-Hydroxy: 15 ng/mL — ABNORMAL LOW (ref 30–89)

## 2011-01-12 LAB — CANCER ANTIGEN 27.29: CA 27.29: 80 U/mL — ABNORMAL HIGH (ref 0–39)

## 2011-01-16 ENCOUNTER — Encounter (HOSPITAL_BASED_OUTPATIENT_CLINIC_OR_DEPARTMENT_OTHER): Payer: Medicaid Other | Admitting: Oncology

## 2011-01-16 ENCOUNTER — Other Ambulatory Visit: Payer: Self-pay | Admitting: Oncology

## 2011-01-16 DIAGNOSIS — Z9889 Other specified postprocedural states: Secondary | ICD-10-CM

## 2011-01-16 DIAGNOSIS — N83209 Unspecified ovarian cyst, unspecified side: Secondary | ICD-10-CM

## 2011-01-16 DIAGNOSIS — C50919 Malignant neoplasm of unspecified site of unspecified female breast: Secondary | ICD-10-CM

## 2011-01-16 DIAGNOSIS — Z170 Estrogen receptor positive status [ER+]: Secondary | ICD-10-CM

## 2011-01-16 DIAGNOSIS — Z923 Personal history of irradiation: Secondary | ICD-10-CM

## 2011-04-10 ENCOUNTER — Ambulatory Visit
Admission: RE | Admit: 2011-04-10 | Discharge: 2011-04-10 | Disposition: A | Discharge status: Home / Self Care | Payer: Medicaid Other | Source: Ambulatory Visit | Attending: Oncology | Admitting: Oncology

## 2011-04-10 DIAGNOSIS — Z9889 Other specified postprocedural states: Secondary | ICD-10-CM

## 2011-04-10 DIAGNOSIS — C50919 Malignant neoplasm of unspecified site of unspecified female breast: Secondary | ICD-10-CM

## 2011-07-01 ENCOUNTER — Telehealth: Payer: Self-pay | Admitting: Oncology

## 2011-07-01 NOTE — Telephone Encounter (Signed)
lmonvm for pt re appts for 4/10 and 4/17 and 10/10 and 10/17. Schedule mailed.

## 2011-07-10 ENCOUNTER — Other Ambulatory Visit (HOSPITAL_BASED_OUTPATIENT_CLINIC_OR_DEPARTMENT_OTHER): Payer: Self-pay | Admitting: Lab

## 2011-07-10 DIAGNOSIS — E876 Hypokalemia: Secondary | ICD-10-CM

## 2011-07-10 DIAGNOSIS — Z170 Estrogen receptor positive status [ER+]: Secondary | ICD-10-CM

## 2011-07-10 DIAGNOSIS — C50119 Malignant neoplasm of central portion of unspecified female breast: Secondary | ICD-10-CM

## 2011-07-10 DIAGNOSIS — C50919 Malignant neoplasm of unspecified site of unspecified female breast: Secondary | ICD-10-CM

## 2011-07-10 LAB — CBC WITH DIFFERENTIAL/PLATELET
BASO%: 1.3 % (ref 0.0–2.0)
EOS%: 2.3 % (ref 0.0–7.0)
MCH: 32.5 pg (ref 25.1–34.0)
MCHC: 34.5 g/dL (ref 31.5–36.0)
MONO#: 0.7 10*3/uL (ref 0.1–0.9)
RBC: 4.02 10*6/uL (ref 3.70–5.45)
RDW: 13.8 % (ref 11.2–14.5)
WBC: 4.9 10*3/uL (ref 3.9–10.3)
lymph#: 2 10*3/uL (ref 0.9–3.3)
nRBC: 0 % (ref 0–0)

## 2011-07-11 LAB — COMPREHENSIVE METABOLIC PANEL
ALT: 27 U/L (ref 0–35)
AST: 33 U/L (ref 0–37)
Albumin: 4.2 g/dL (ref 3.5–5.2)
Alkaline Phosphatase: 58 U/L (ref 39–117)
BUN: 12 mg/dL (ref 6–23)
Calcium: 9.7 mg/dL (ref 8.4–10.5)
Chloride: 98 mEq/L (ref 96–112)
Potassium: 4.7 mEq/L (ref 3.5–5.3)

## 2011-07-17 ENCOUNTER — Ambulatory Visit (HOSPITAL_BASED_OUTPATIENT_CLINIC_OR_DEPARTMENT_OTHER): Payer: Medicaid Other | Admitting: Physician Assistant

## 2011-07-17 ENCOUNTER — Encounter: Payer: Self-pay | Admitting: Physician Assistant

## 2011-07-17 VITALS — BP 148/75 | HR 85 | Temp 98.1°F | Ht 60.5 in | Wt 181.0 lb

## 2011-07-17 DIAGNOSIS — Z170 Estrogen receptor positive status [ER+]: Secondary | ICD-10-CM

## 2011-07-17 DIAGNOSIS — R944 Abnormal results of kidney function studies: Secondary | ICD-10-CM

## 2011-07-17 DIAGNOSIS — E7889 Other lipoprotein metabolism disorders: Secondary | ICD-10-CM | POA: Insufficient documentation

## 2011-07-17 DIAGNOSIS — K760 Fatty (change of) liver, not elsewhere classified: Secondary | ICD-10-CM

## 2011-07-17 DIAGNOSIS — E876 Hypokalemia: Secondary | ICD-10-CM

## 2011-07-17 DIAGNOSIS — C50119 Malignant neoplasm of central portion of unspecified female breast: Secondary | ICD-10-CM

## 2011-07-17 DIAGNOSIS — C50912 Malignant neoplasm of unspecified site of left female breast: Secondary | ICD-10-CM | POA: Insufficient documentation

## 2011-07-17 HISTORY — DX: Fatty (change of) liver, not elsewhere classified: K76.0

## 2011-07-17 HISTORY — DX: Hypokalemia: E87.6

## 2011-07-17 MED ORDER — ANASTROZOLE 1 MG PO TABS: 1.0000 mg | ORAL_TABLET | Freq: Every day | ORAL | Status: DC

## 2011-07-17 NOTE — Progress Notes (Signed)
ID: Carrie [name]   DOB: Apr 04, 1950  MR#: [id number]  CSN#:[id number]  HISTORY OF PRESENT ILLNESS: .  Ms Schlotzhauer herself palpated a mass in her right breast.  She brought it to the attention of Dr Beather Arbour and he set her up for diagnostic mammography at the [facility], which was performed March 13, 2009.  Dr Deboraha Sprang at that exam was able to palpate a pea-size nodule at the 12 o'clock position in the subareolar region of the right breast.  On mammography, the breast was largely fatty and there was a tiny subcutaneous nodule in the right subareolar region.  There were no calcifications associated with this and no other mammographic abnormalities.  By ultrasound, the nodule in question had ill-defined borders and measured 6 mm.  Sonography of the right axilla showed no abnormal lymph nodes.  Biopsy was suggested and was performed on February 4.  The pathology from that procedure ([id number]) showed an invasive ductal carcinoma described as high grade by Dr Delila Spence.    With this information, the patient was set up for bilateral breast MRIs on February 14 and this showed in the upper central portion of the right breast a 13 mm lesion correlating well with the ultrasound and mammogram previously described.  The left breast was unremarkable and there was no adenopathy noted.  Incidentally, in the right liver, there was a 2 cm lesion which showed enhancement post gadolinium but could not be further characterized.  Statistically this was felt to be a hemangioma but certainly will require further evaluation.  With this information, the patient proceeded to right lumpectomy and sentinel lymph node sampling May 20, 2009, under Dr Derrell Lolling.  The final pathology from this procedure ([id number]) showed a 1.3 cm invasive ductal carcinoma, grade 1, with no evidence of lymphovascular invasion and ample margins.  Two sentinel lymph nodes were clear. Tumor was ER positive, PR negative, and HER-2/neu negative, with an  MIB-1 of 3%.  Patient received radiation therapy which was completed in May of 2011. At that time she was started on anastrozole, 1 mg daily, and continues with good tolerance.  INTERVAL HISTORY: Carrie [name] returns today for routine six-month followup of her right breast carcinoma. She continues to work 2-3 times per week. The rest of the time she spends "hanging out in the backyard". She is still drinking quite a bit of beer, averaging 5-6 six packs weekly.  Carrie [name] has had a complete GI evaluation with Dr. Bosie Clos since her appointment here in October with no evidence of any significant abnormalities. She was started on Protonix. She still has a decreased appetite with occasional queasiness. No change in bowel habits, and no blood in the stool. She continues to be followed at [facility] by Dr. Andrey Campanile for other issues including arthritis, and hypertension.  The patient is tolerating the anastrozole well. She's had some problems with hot flashes although these are not particularly problematic to her. No increased joint pain. No vaginal dryness.  REVIEW OF SYSTEMS: She has had no recent illnesses and denies fevers, chills, or night sweats. No rashes or skin changes. No abnormal bleeding. No cough or shortness of breath. No chest pain. No abnormal headaches, dizziness, or change in vision.  A detailed review of systems is otherwise noncontributory.  PAST MEDICAL HISTORY: Past Medical History  Diagnosis Date  . Breast cancer, left breast 07/17/2011  . Hypokalemia 07/17/2011  . Hepatic steatosis 07/17/2011    PAST SURGICAL HISTORY: History reviewed. No pertinent past surgical history.  FAMILY  HISTORY History reviewed. No pertinent family history. There is no history of breast, ovarian or any other cancer in the family to the patient's knowledge.  GYNECOLOGIC HISTORY:  She is GX, P0.  Last menstrual period was approximately 6 years ago.  She was not on hormone replacement.  She is having fairly  significant hot flashes.    SOCIAL HISTORY:  She works as a Advertising copywriter.  She is single.  Lives alone.  Has no pets.  Is not a church attender.     ADVANCED DIRECTIVES:  HEALTH MAINTENANCE: History  Substance Use Topics  . Smoking status: Never Smoker   . Smokeless tobacco: Not on file  . Alcohol Use: 18.0 oz/week    30 Cans of beer per week     Colonoscopy:  PAP:  Bone density:  Lipid panel:  No Known Allergies  Current Outpatient Prescriptions  Medication Sig Dispense Refill  . amLODipine (NORVASC) 2.5 MG tablet Take 2.5 mg by mouth daily.      Marland Kitchen anastrozole (ARIMIDEX) 1 MG tablet Take 1 tablet (1 mg total) by mouth daily.  90 tablet  3  . diclofenac (VOLTAREN) 50 MG EC tablet Take 50 mg by mouth 2 (two) times daily.      . irbesartan (AVAPRO) 150 MG tablet Take 150 mg by mouth at bedtime.      . pantoprazole (PROTONIX) 40 MG tablet Take 40 mg by mouth daily.      . potassium bicarbonate (K-LYTE) 25 MEQ disintegrating tablet Take 25 mEq by mouth 2 (two) times daily.        OBJECTIVE: Filed Vitals:   07/17/11 1324  BP: 148/75  Pulse: 85  Temp: 98.1 F (36.7 C)     Body mass index is 34.77 kg/(m^2).    ECOG FS: 0  Physical Exam: HEENT:  Sclerae anicteric, conjunctivae pink.  Oropharynx clear.  No mucositis or candidiasis.   Nodes:  No cervical, supraclavicular, or axillary lymphadenopathy palpated.  Breast Exam:  Right breast status post lumpectomy with no areolar complex. No suspicious nodularity or skin changes. No evidence of local recurrence. Left breast is benign, with no masses, skin changes, or nipple inversion. Lungs:  Clear to auscultation bilaterally.  No crackles, rhonchi, or wheezes.   Heart:  Regular rate and rhythm.   Abdomen:  Soft, nontender.  Positive bowel sounds.  No organomegaly or masses palpated.   Musculoskeletal:  No focal spinal tenderness to palpation.  Extremities:  Benign.  No peripheral edema or cyanosis.   Skin:  Benign.   Neuro:   Nonfocal. Alert and oriented x3.    LAB RESULTS: Lab Results  Component Value Date   WBC 4.9 07/10/2011   NEUTROABS 2.0 07/10/2011   HGB 13.1 07/10/2011   HCT 37.9 07/10/2011   MCV 94.2 07/10/2011   PLT 312 07/10/2011      Chemistry      Component Value Date/Time   NA 134* 07/10/2011 1410   K 4.7 07/10/2011 1410   CL 98 07/10/2011 1410   CO2 27 07/10/2011 1410   BUN 12 07/10/2011 1410   CREATININE 1.20* 07/10/2011 1410      Component Value Date/Time   CALCIUM 9.7 07/10/2011 1410   ALKPHOS 58 07/10/2011 1410   AST 33 07/10/2011 1410   ALT 27 07/10/2011 1410   BILITOT 0.6 07/10/2011 1410       Lab Results  Component Value Date   LABCA2 86* 07/10/2011  CA27.29 has remained stable over time, 103 in June  of 2012, 97 in July 2012, 80 in October of 2012, and currently 74.    STUDIES:  04/10/2011 *RADIOLOGY REPORT*  Clinical Data: History of right lumpectomy in February 2011.  Status post radiation therapy.  DIGITAL DIAGNOSTIC BILATERAL MAMMOGRAM WITH CAD  Comparison: 03/15/2010 and earlier  Findings: Breast parenchyma is heterogeneously dense. No  suspicious mass, distortion, or microcalcifications are identified  to suggest presence of malignancy. Postoperative changes are  identified on the right. Magnified view of the lumpectomy site is  negative.  Mammographic images were processed with CAD.  IMPRESSION:  1. No mammographic evidence for malignancy.  2. Diagnostic mammogram is recommended in January 2014.  BI-RADS CATEGORY 2: Benign finding(s).  Original Report Authenticated By: Patterson Hammersmith, M.D.   ASSESSMENT: A 61 year old [place] woman with:  1. Breast cancer, status post right lumpectomy and sentinel lymph node biopsy February 2011 for a T1c N0 grade 1 invasive ductal carcinoma, estrogen receptor positive, progesterone receptor and HER2 negative, with an MIB-1 of 3%, status post radiation therapy completed May 2011, at which time she began anastrozole.  She  continues on anastrozole with good tolerance and the plan is to continue that for 5 years.  2. History of an ovarian cyst on the right, the patient declining surgery and currently asymptomatic.  3. Persistently elevated CA27.29, actually now declining, with no evidence of metastatic disease.   4. History of hepatic steatosis.   5. History of hypokalemia, now improved on "fizzy potassium" tablets.  6. Gastrointestinal complaints, followed by Dr. Bosie Clos 7. Arthritis, particularly involving the right knee, followed by Dr. Charlett Blake.     PLAN: With regards to her breast cancer, Breshay continues to do well. She will continue on anastrozole which we have refilled for another year. We will continue to see her here every 6 months for routine labs and physical exam. Her next appointment with [place] will be in October. She will call prior that time with any changes or problems. Her next mammogram is not due until January 2014.  Of note, per patient request, I am faxing a copy of her current labs, including a slightly elevated serum creatinine, to her primary care doctor at [facility], Dr. Georganna Skeans.   Carrie [name]    07/17/2011

## 2011-07-30 ENCOUNTER — Other Ambulatory Visit: Payer: Self-pay | Admitting: *Deleted

## 2011-07-30 DIAGNOSIS — C50919 Malignant neoplasm of unspecified site of unspecified female breast: Secondary | ICD-10-CM

## 2011-07-30 MED ORDER — VENLAFAXINE HCL ER 75 MG PO CP24: 75.0000 mg | ORAL_CAPSULE | Freq: Every day | ORAL | Status: DC

## 2011-08-09 MED ORDER — SCOPOLAMINE 1 MG/3DAYS TD PT72
MEDICATED_PATCH | TRANSDERMAL | Status: AC
  Filled 2011-08-09: qty 1

## 2011-09-06 ENCOUNTER — Encounter: Payer: Self-pay | Admitting: Oncology

## 2011-09-06 NOTE — Progress Notes (Signed)
Faxed arimidex application to [facility].

## 2012-01-09 ENCOUNTER — Other Ambulatory Visit: Payer: Self-pay | Admitting: Physician Assistant

## 2012-01-09 ENCOUNTER — Other Ambulatory Visit (HOSPITAL_BASED_OUTPATIENT_CLINIC_OR_DEPARTMENT_OTHER): Payer: Medicaid Other | Admitting: Lab

## 2012-01-09 DIAGNOSIS — K760 Fatty (change of) liver, not elsewhere classified: Secondary | ICD-10-CM

## 2012-01-09 DIAGNOSIS — C50912 Malignant neoplasm of unspecified site of left female breast: Secondary | ICD-10-CM

## 2012-01-09 DIAGNOSIS — C50119 Malignant neoplasm of central portion of unspecified female breast: Secondary | ICD-10-CM

## 2012-01-09 LAB — COMPREHENSIVE METABOLIC PANEL (CC13)
ALT: 79 U/L — ABNORMAL HIGH (ref 0–55)
AST: 78 U/L — ABNORMAL HIGH (ref 5–34)
Albumin: 4.2 g/dL (ref 3.5–5.0)
Calcium: 10.3 mg/dL (ref 8.4–10.4)
Chloride: 99 mEq/L (ref 98–107)
Creatinine: 0.9 mg/dL (ref 0.6–1.1)
Potassium: 3.9 mEq/L (ref 3.5–5.1)
Sodium: 134 mEq/L — ABNORMAL LOW (ref 136–145)
Total Protein: 7.8 g/dL (ref 6.4–8.3)

## 2012-01-09 LAB — CBC WITH DIFFERENTIAL/PLATELET
BASO%: 0.6 % (ref 0.0–2.0)
EOS%: 2.5 % (ref 0.0–7.0)
MCH: 31.3 pg (ref 25.1–34.0)
MCHC: 34.3 g/dL (ref 31.5–36.0)
RBC: 4.22 10*6/uL (ref 3.70–5.45)
RDW: 13.5 % (ref 11.2–14.5)
lymph#: 1.8 10*3/uL (ref 0.9–3.3)

## 2012-01-09 LAB — CANCER ANTIGEN 27.29: CA 27.29: 84 U/mL — ABNORMAL HIGH (ref 0–39)

## 2012-01-16 ENCOUNTER — Telehealth: Payer: Self-pay | Admitting: Oncology

## 2012-01-16 ENCOUNTER — Ambulatory Visit: Payer: Medicaid Other | Admitting: Oncology

## 2012-01-16 ENCOUNTER — Ambulatory Visit (HOSPITAL_BASED_OUTPATIENT_CLINIC_OR_DEPARTMENT_OTHER): Payer: Medicaid Other | Admitting: Physician Assistant

## 2012-01-16 ENCOUNTER — Other Ambulatory Visit (HOSPITAL_BASED_OUTPATIENT_CLINIC_OR_DEPARTMENT_OTHER): Payer: Medicaid Other | Admitting: Lab

## 2012-01-16 ENCOUNTER — Encounter: Payer: Self-pay | Admitting: Physician Assistant

## 2012-01-16 VITALS — BP 152/91 | HR 97 | Temp 97.7°F | Resp 20 | Ht 60.5 in | Wt 187.9 lb

## 2012-01-16 DIAGNOSIS — R970 Elevated carcinoembryonic antigen [CEA]: Secondary | ICD-10-CM

## 2012-01-16 DIAGNOSIS — M79641 Pain in right hand: Secondary | ICD-10-CM

## 2012-01-16 DIAGNOSIS — K760 Fatty (change of) liver, not elsewhere classified: Secondary | ICD-10-CM

## 2012-01-16 DIAGNOSIS — C50119 Malignant neoplasm of central portion of unspecified female breast: Secondary | ICD-10-CM

## 2012-01-16 DIAGNOSIS — C50912 Malignant neoplasm of unspecified site of left female breast: Secondary | ICD-10-CM

## 2012-01-16 DIAGNOSIS — R197 Diarrhea, unspecified: Secondary | ICD-10-CM

## 2012-01-16 DIAGNOSIS — Z853 Personal history of malignant neoplasm of breast: Secondary | ICD-10-CM

## 2012-01-16 DIAGNOSIS — F1010 Alcohol abuse, uncomplicated: Secondary | ICD-10-CM

## 2012-01-16 DIAGNOSIS — Z170 Estrogen receptor positive status [ER+]: Secondary | ICD-10-CM

## 2012-01-16 DIAGNOSIS — Z780 Asymptomatic menopausal state: Secondary | ICD-10-CM

## 2012-01-16 LAB — COMPREHENSIVE METABOLIC PANEL (CC13)
ALT: 45 U/L (ref 0–55)
CO2: 23 mEq/L (ref 22–29)
Calcium: 10.3 mg/dL (ref 8.4–10.4)
Chloride: 102 mEq/L (ref 98–107)
Creatinine: 0.9 mg/dL (ref 0.6–1.1)
Glucose: 94 mg/dl (ref 70–99)

## 2012-01-16 NOTE — Patient Instructions (Signed)
Continue on anastrazole daily as before.  See Dr. Bosie Clos about diarrhea and lack of appetite  See. Dr. Teressa Senter about right hand/finger  Mammo and Bone Density in Jan 2014 at [facility]  Return in 6 months for labs and follow up visit  Call with questions or problems   [phone]

## 2012-01-16 NOTE — Telephone Encounter (Signed)
The pt [facility]ts [facility]

## 2012-01-16 NOTE — Telephone Encounter (Signed)
S/w tammy from dr sypher's office and he is not accepting any new mcaid pt's.  S/w rebecca from dr schooler's office to set up an appt. Per rebecca the pt will need to go through the name of the hospitalist on the back of her card and they will be able to make her a referral to see the md. Pt is aware of this process. gve the pt her April 2014 appt calendars

## 2012-01-17 NOTE — Progress Notes (Signed)
ID: Carrie [name]   DOB: 03/22/1951  MR#: [id number]  CSN#:[id number]  HISTORY OF PRESENT ILLNESS: .  Carrie [name] herself palpated a mass in her right breast.  She brought it to the attention of Dr Beather Arbour and he set her up for diagnostic mammography at the [facility], which was performed March 13, 2009.  Dr Deboraha Sprang at that exam was able to palpate a pea-size nodule at the 12 o'clock position in the subareolar region of the right breast.  On mammography, the breast was largely fatty and there was a tiny subcutaneous nodule in the right subareolar region.  There were no calcifications associated with this and no other mammographic abnormalities.  By ultrasound, the nodule in question had ill-defined borders and measured 6 mm.  Sonography of the right axilla showed no abnormal lymph nodes.  Biopsy was suggested and was performed on February 4.  The pathology from that procedure ([id number]) showed an invasive ductal carcinoma described as high grade by Dr Delila Spence.    With this information, the patient was set up for bilateral breast MRIs on February 14 and this showed in the upper central portion of the right breast a 13 mm lesion correlating well with the ultrasound and mammogram previously described.  The left breast was unremarkable and there was no adenopathy noted.  Incidentally, in the right liver, there was a 2 cm lesion which showed enhancement post gadolinium but could not be further characterized.  Statistically this was felt to be a hemangioma but certainly will require further evaluation.  With this information, the patient proceeded to right lumpectomy and sentinel lymph node sampling May 20, 2009, under Dr Derrell Lolling.  The final pathology from this procedure ([id number]) showed a 1.3 cm invasive ductal carcinoma, grade 1, with no evidence of lymphovascular invasion and ample margins.  Two sentinel lymph nodes were clear. Tumor was ER positive, PR negative, and HER-2/neu negative, with an  MIB-1 of 3%.  Patient received radiation therapy which was completed in May of 2011. At that time she was started on anastrozole, 1 mg daily, and continues with good tolerance.  INTERVAL HISTORY: Carrie [name] returns today for routine six-month followup of her right breast carcinoma. She continues to work 2-3 days per week. The rest of the time she spends "hanging out in the backyard". She is still drinking quite a bit of beer, averaging greater than one 6 pack daily.  Labs drawn one week ago showed an elevated AST of 78 and an elevated ALT of 79. I repeated these labs again today, with the AST improved but still elevated at 53, and to be ALT normalized at 45. The patient has a history of not only alcohol abuse, but also known hepatic steatosis.  The patient is tolerating the anastrozole well. She's had some problems with hot flashes but denies any increased joint pain. No vaginal dryness.  REVIEW OF SYSTEMS: Carrie [name] has had no recent illnesses and denies fevers, chills, or night sweats. No rashes or skin changes. No abnormal bleeding. She denies any nausea or emesis, but tells me she has "no appetite at all". She knows this may be associated with the amount of beer she is drinking as well. She has had changes in her bowel habits, and is having increased diarrhea, 4-5 times daily. She tells me it has been approximately one year since her last followup with her gastroenterologist, Dr. Bosie Clos. Carrie [name] has had no cough or shortness of breath. No chest pain. No abnormal headaches, dizziness, or change  in vision.  She's had some increased pain affecting the right hand, especially the third digit of the right hand which gets "stuck closed" at times.  A detailed review of systems is otherwise noncontributory.  PAST MEDICAL HISTORY: Past Medical History  Diagnosis Date  . Breast cancer, left breast 07/17/2011  . Hypokalemia 07/17/2011  . Hepatic steatosis 07/17/2011    PAST SURGICAL HISTORY: No past surgical history  on file.  FAMILY HISTORY No family history on file. There is no history of breast, ovarian or any other cancer in the family to the patient's knowledge.  GYNECOLOGIC HISTORY:  She is GX, P0.  Last menstrual period was approximately 6 years ago.  She was not on hormone replacement.  She is having fairly significant hot flashes.    SOCIAL HISTORY:  She works as a Advertising copywriter.  She is single.  Lives alone.  Has no pets.  Is not a church attender.     ADVANCED DIRECTIVES:  HEALTH MAINTENANCE: History  Substance Use Topics  . Smoking status: Never Smoker   . Smokeless tobacco: Not on file  . Alcohol Use: 18.0 oz/week    30 Cans of beer per week     Colonoscopy:  PAP:  Bone density:  Lipid panel:  No Known Allergies  Current Outpatient Prescriptions  Medication Sig Dispense Refill  . amLODipine (NORVASC) 2.5 MG tablet Take 2.5 mg by mouth daily.      Marland Kitchen anastrozole (ARIMIDEX) 1 MG tablet Take 1 tablet (1 mg total) by mouth daily.  90 tablet  3  . diclofenac (VOLTAREN) 50 MG EC tablet Take 50 mg by mouth 2 (two) times daily.      . irbesartan (AVAPRO) 150 MG tablet Take 150 mg by mouth at bedtime.      . pantoprazole (PROTONIX) 40 MG tablet Take 40 mg by mouth daily.      . potassium bicarbonate (K-LYTE) 25 MEQ disintegrating tablet Take 25 mEq by mouth 2 (two) times daily.      Marland Kitchen venlafaxine XR (EFFEXOR-XR) 75 MG 24 hr capsule Take 1 capsule (75 mg total) by mouth daily.  30 capsule  5    OBJECTIVE: Filed Vitals:   01/16/12 1433  BP: 152/91  Pulse: 97  Temp: 97.7 F (36.5 C)  Resp: 20     Body mass index is 36.09 kg/(m^2).    ECOG FS: 0 Filed Weights   01/16/12 1433  Weight: 187 lb 14.4 oz (85.231 kg)    Physical Exam: HEENT:  Sclerae anicteric.  Oropharynx clear.   Nodes:  No cervical, supraclavicular, or axillary lymphadenopathy palpated.  Breast Exam:  Right breast status post lumpectomy with no areolar complex. No suspicious nodularity or skin changes. No evidence  of local recurrence. Left breast is unremarkable Lungs:  Clear to auscultation bilaterally.  No crackles, rhonchi, or wheezes.   Heart:  Regular rate and rhythm.   Abdomen:  Soft, nontender.  Positive bowel sounds.  Musculoskeletal:  No focal spinal tenderness to palpation.  Extremities: No peripheral edema or cyanosis.   Neuro:  Nonfocal. Alert and oriented x3.    LAB RESULTS: Lab Results  Component Value Date   WBC 4.8 01/09/2012   NEUTROABS 2.4 01/09/2012   HGB 13.2 01/09/2012   HCT 38.5 01/09/2012   MCV 91.2 01/09/2012   PLT 249 01/09/2012      Chemistry      Component Value Date/Time   NA 136 01/16/2012 1411   NA 134* 07/10/2011 1410  K 3.9 01/16/2012 1411   K 4.7 07/10/2011 1410   CL 102 01/16/2012 1411   CL 98 07/10/2011 1410   CO2 23 01/16/2012 1411   CO2 27 07/10/2011 1410   BUN 11.0 01/16/2012 1411   BUN 12 07/10/2011 1410   CREATININE 0.9 01/16/2012 1411   CREATININE 1.20* 07/10/2011 1410      Component Value Date/Time   CALCIUM 10.3 01/16/2012 1411   CALCIUM 9.7 07/10/2011 1410   ALKPHOS 69 01/16/2012 1411   ALKPHOS 58 07/10/2011 1410   AST 53* 01/16/2012 1411   AST 33 07/10/2011 1410   ALT 45 01/16/2012 1411   ALT 27 07/10/2011 1410   BILITOT 0.70 01/16/2012 1411   BILITOT 0.6 07/10/2011 1410       Lab Results  Component Value Date   LABCA2 84* 01/09/2012  CA27.29 has remained stable over time, 103 in June of 2012, 97 in July 2012, 80 in October of 2012, 86 in April 2013, and most recently 84 on 01/09/12.  STUDIES:  04/10/2011 *RADIOLOGY REPORT*  Clinical Data: History of right lumpectomy in February 2011.  Status post radiation therapy.  DIGITAL DIAGNOSTIC BILATERAL MAMMOGRAM WITH CAD  Comparison: 03/15/2010 and earlier  Findings: Breast parenchyma is heterogeneously dense. No  suspicious mass, distortion, or microcalcifications are identified  to suggest presence of malignancy. Postoperative changes are  identified on the right. Magnified view  of the lumpectomy site is  negative.  Mammographic images were processed with CAD.  IMPRESSION:  1. No mammographic evidence for malignancy.  2. Diagnostic mammogram is recommended in January 2014.  BI-RADS CATEGORY 2: Benign finding(s).  Original Report Authenticated By: Patterson Hammersmith, M.D.   ASSESSMENT: A 61 year old [place] woman with:  1. Breast cancer, status post right lumpectomy and sentinel lymph node biopsy February 2011 for a T1c N0 grade 1 invasive ductal carcinoma, estrogen receptor positive, progesterone receptor and HER2 negative, with an MIB-1 of 3%, status post radiation therapy completed May 2011, at which time she began anastrozole.  She continues on anastrozole with good tolerance and the plan is to continue that for 5 years.  2. History of an ovarian cyst on the right, the patient declining surgery and currently asymptomatic.  3. Persistently elevated CA27.29, stable, with no evidence of metastatic disease.   4. History of hepatic steatosis.   5. History of hypokalemia, now improved on "fizzy potassium" tablets.  6. Gastrointestinal complaints, followed by Dr. Bosie Clos 7. Arthritis, particularly involving the right knee, followed by Dr. Charlett Blake.     PLAN: With regards to her breast cancer, Carrie [name] continues to do well. She will continue on anastrozole and we will continue to see her here every 6 months for routine labs and physical exam. She is due for her next mammogram and we will also order a bone density exam in January 2014.  In the meanwhile, I am referring her back to Dr. Bosie Clos per her request, for further evaluation of diarrhea and anorexia. She would also like to see someone regarding her right hand, and I have made a referral to Dr.  Teressa Senter for further evaluation of pain in the right hand, and possible "trigger finger" in the third digit of the right hand.   I have encouraged Carrie [name] to decrease her alcohol consumption we will continue to follow her liver  enzymes on a every 6 month basis with each visit. She voices understanding and agreement with our plan and will call with any problems .    Oberon Hehir  01/17/2012   

## 2012-03-12 ENCOUNTER — Other Ambulatory Visit: Payer: Self-pay | Admitting: *Deleted

## 2012-03-12 DIAGNOSIS — C50919 Malignant neoplasm of unspecified site of unspecified female breast: Secondary | ICD-10-CM

## 2012-03-12 MED ORDER — POTASSIUM BICARBONATE 25 MEQ PO TBEF: EFFERVESCENT_TABLET | ORAL | Status: DC

## 2012-03-26 ENCOUNTER — Emergency Department (HOSPITAL_COMMUNITY)
Admission: EM | Admit: 2012-03-26 | Discharge: 2012-03-26 | Disposition: A | Discharge status: Home / Self Care | Payer: Medicaid Other | Source: Home / Self Care | Attending: Emergency Medicine | Admitting: Emergency Medicine

## 2012-03-26 ENCOUNTER — Emergency Department (INDEPENDENT_AMBULATORY_CARE_PROVIDER_SITE_OTHER): Payer: Medicaid Other

## 2012-03-26 ENCOUNTER — Encounter (HOSPITAL_COMMUNITY): Payer: Self-pay | Admitting: Emergency Medicine

## 2012-03-26 DIAGNOSIS — J111 Influenza due to unidentified influenza virus with other respiratory manifestations: Secondary | ICD-10-CM

## 2012-03-26 DIAGNOSIS — J209 Acute bronchitis, unspecified: Secondary | ICD-10-CM

## 2012-03-26 MED ORDER — AZITHROMYCIN 250 MG PO TABS: ORAL_TABLET | ORAL | Status: DC

## 2012-03-26 MED ORDER — TRAMADOL HCL 50 MG PO TABS: 100.0000 mg | ORAL_TABLET | Freq: Three times a day (TID) | ORAL | Status: DC | PRN

## 2012-03-26 MED ORDER — GUAIFENESIN-CODEINE 100-10 MG/5ML PO SYRP: 10.0000 mL | ORAL_SOLUTION | Freq: Four times a day (QID) | ORAL | Status: DC | PRN

## 2012-03-26 MED ORDER — ALBUTEROL SULFATE HFA 108 (90 BASE) MCG/ACT IN AERS: 1.0000 | INHALATION_SPRAY | Freq: Four times a day (QID) | RESPIRATORY_TRACT | Status: DC | PRN

## 2012-03-26 MED ORDER — OSELTAMIVIR PHOSPHATE 75 MG PO CAPS: 75.0000 mg | ORAL_CAPSULE | Freq: Two times a day (BID) | ORAL | Status: DC

## 2012-03-26 NOTE — ED Notes (Signed)
C/o productive cough which has the right side of chest hurting.  Coughing since Dec. 21.  OTC medications taken but no relief.  Nausea feeling and diarrhea.  Denies vomiting

## 2012-03-26 NOTE — ED Provider Notes (Signed)
Chief Complaint  Patient presents with  . Cough    History of Present Illness:   The patient is a 61 year old female who has had a four-day history of cough productive of green sputum, wheezing, pain in her right, anterior, lower chest which is worse if she takes a deep breath or coughs. She's felt feverish and chilled. She's had nasal congestion with clear yellow rhinorrhea, headache, ear congestion. She's also had sore throat, diarrhea, and anorexia. She has tried TheraFlu and NyQuil without much improvement. She has a history of high blood pressure and breast cancer. Her breast cancer was treated 2 years ago with radiation surgery. She remains on Arimidex.  Review of Systems:  Other than noted above, the patient denies any of the following symptoms. Systemic:  No fever, chills, sweats, fatigue, myalgias, headache, or anorexia. Eye:  No redness, pain or drainage. ENT:  No earache, ear congestion, nasal congestion, sneezing, rhinorrhea, sinus pressure, sinus pain, post nasal drip, or sore throat. Lungs:  No cough, sputum production, wheezing, shortness of breath, or chest pain. GI:  No abdominal pain, nausea, vomiting, or diarrhea.  PMFSH:  Past medical history, family history, social history, meds, and allergies were reviewed.  Physical Exam:   Vital signs:  BP 121/87  Pulse 110  Temp 100.4 F (38 C) (Oral)  Resp 18  SpO2 98% General:  Alert, in no distress. Eye:  No conjunctival injection or drainage. Lids were normal. ENT:  TMs and canals were normal, without erythema or inflammation.  Nasal mucosa was clear and uncongested, without drainage.  Mucous membranes were moist.  Pharynx was clear, without exudate or drainage.  There were no oral ulcerations or lesions. Neck:  Supple, no adenopathy, tenderness or mass. Lungs:  No respiratory distress.  Lungs were clear to auscultation, without wheezes, rales or rhonchi.  Breath sounds were clear and equal bilaterally.  Heart:  Regular  rhythm, without gallops, murmers or rubs. Skin:  Clear, warm, and dry, without rash or lesions.  Radiology:  Dg Chest 2 View  03/26/2012  *RADIOLOGY REPORT*  Clinical Data: Productive cough  CHEST - 2 VIEW  Comparison: None.  Findings: Normal heart size.  Lungs are under aerated and clear. No pleural effusion.  No pneumothorax.  IMPRESSION: No active cardiopulmonary disease.   Original Report Authenticated By: Jolaine Click, M.D.    I reviewed the images independently and personally and concur with the radiologist's findings.  Assessment:  The primary encounter diagnosis was Acute bronchitis. A diagnosis of Influenza-like illness was also pertinent to this visit.  Plan:   1.  The following meds were prescribed:   New Prescriptions   ALBUTEROL (PROVENTIL HFA;VENTOLIN HFA) 108 (90 BASE) MCG/ACT INHALER    Inhale 1-2 puffs into the lungs every 6 (six) hours as needed for wheezing.   AZITHROMYCIN (ZITHROMAX Z-PAK) 250 MG TABLET    Take as directed.   GUAIFENESIN-CODEINE (GUIATUSS AC) 100-10 MG/5ML SYRUP    Take 10 mLs by mouth 4 (four) times daily as needed for cough.   OSELTAMIVIR (TAMIFLU) 75 MG CAPSULE    Take 1 capsule (75 mg total) by mouth every 12 (twelve) hours.   TRAMADOL (ULTRAM) 50 MG TABLET    Take 2 tablets (100 mg total) by mouth every 8 (eight) hours as needed for pain.   2.  The patient was instructed in symptomatic care and handouts were given. 3.  The patient was told to return if becoming worse in any way, if no better in 3  or 4 days, and given some red flag symptoms that would indicate earlier return.   Reuben Likes, MD 03/26/12 2159

## 2012-03-31 ENCOUNTER — Emergency Department (HOSPITAL_COMMUNITY)
Admission: EM | Admit: 2012-03-31 | Discharge: 2012-03-31 | Disposition: A | Discharge status: Home / Self Care | Payer: Medicaid Other | Source: Home / Self Care | Attending: Family Medicine | Admitting: Family Medicine

## 2012-03-31 ENCOUNTER — Encounter (HOSPITAL_COMMUNITY): Payer: Self-pay | Admitting: Emergency Medicine

## 2012-03-31 DIAGNOSIS — M940 Chondrocostal junction syndrome [Tietze]: Secondary | ICD-10-CM

## 2012-03-31 DIAGNOSIS — J40 Bronchitis, not specified as acute or chronic: Secondary | ICD-10-CM

## 2012-03-31 DIAGNOSIS — R05 Cough: Secondary | ICD-10-CM

## 2012-03-31 MED ORDER — ALBUTEROL SULFATE (5 MG/ML) 0.5% IN NEBU
5.0000 mg | INHALATION_SOLUTION | Freq: Once | RESPIRATORY_TRACT | Status: AC
  Administered 2012-03-31: 5 mg via RESPIRATORY_TRACT

## 2012-03-31 MED ORDER — PREDNISONE 20 MG PO TABS
ORAL_TABLET | ORAL | Status: AC
  Filled 2012-03-31: qty 3

## 2012-03-31 MED ORDER — PREDNISONE 10 MG PO TABS: 20.0000 mg | ORAL_TABLET | Freq: Two times a day (BID) | ORAL | Status: DC

## 2012-03-31 MED ORDER — ONDANSETRON HCL 4 MG/2ML IJ SOLN
INTRAMUSCULAR | Status: AC
  Filled 2012-03-31: qty 2

## 2012-03-31 MED ORDER — BENZONATATE 100 MG PO CAPS: 100.0000 mg | ORAL_CAPSULE | Freq: Three times a day (TID) | ORAL | Status: DC

## 2012-03-31 MED ORDER — IPRATROPIUM BROMIDE 0.02 % IN SOLN
0.5000 mg | Freq: Once | RESPIRATORY_TRACT | Status: AC
  Administered 2012-03-31: 0.5 mg via RESPIRATORY_TRACT

## 2012-03-31 MED ORDER — ONDANSETRON HCL 4 MG/2ML IJ SOLN
4.0000 mg | Freq: Once | INTRAMUSCULAR | Status: AC
  Administered 2012-03-31: 4 mg via INTRAMUSCULAR

## 2012-03-31 MED ORDER — ALBUTEROL SULFATE (5 MG/ML) 0.5% IN NEBU
INHALATION_SOLUTION | RESPIRATORY_TRACT | Status: AC
  Filled 2012-03-31: qty 1

## 2012-03-31 MED ORDER — PREDNISONE 20 MG PO TABS
60.0000 mg | ORAL_TABLET | Freq: Every day | ORAL | Status: DC
  Administered 2012-03-31: 60 mg via ORAL

## 2012-03-31 NOTE — ED Provider Notes (Signed)
History     CSN: [id number]  Arrival date & time 03/31/12  1000   First MD Initiated Contact with Patient 03/31/12 1028      Chief Complaint  Patient presents with  . Cough    productive cough with green sputum. sweats. hot/cold chills. decrease in appetite.    (Consider location/radiation/quality/duration/timing/severity/associated sxs/prior treatment) Patient is a 61 y.o. female presenting with cough. The history is provided by the patient.  Cough This is a recurrent problem. The current episode started more than 1 week ago. The problem occurs constantly. The cough is productive of sputum (vomit inducing). There has been no fever (since last week). Associated symptoms include chest pain, chills, sweats, headaches, rhinorrhea, shortness of breath and wheezing. Treatments tried: Patient seen in UC four days ago and was prescribed antibiotics along with beta agonists, states no significant improvement.   She is not a smoker. Her past medical history is significant for bronchitis. Her past medical history does not include pneumonia, COPD, emphysema or asthma.    Past Medical History  Diagnosis Date  . Breast cancer, left breast 07/17/2011  . Hypokalemia 07/17/2011  . Hepatic steatosis 07/17/2011    History reviewed. No pertinent past surgical history.  Family History  Problem Relation Age of Onset  . Cancer Other     History  Substance Use Topics  . Smoking status: Never Smoker   . Smokeless tobacco: Not on file  . Alcohol Use: 18.0 oz/week    30 Cans of beer per week    OB History    Grav Para Term Preterm Abortions TAB SAB Ect Mult Living                  Review of Systems  Constitutional: Positive for chills, appetite change and fatigue.  HENT: Positive for congestion and rhinorrhea.   Respiratory: Positive for cough, shortness of breath and wheezing.   Cardiovascular: Positive for chest pain.  Gastrointestinal: Positive for nausea and diarrhea.  Neurological:  Positive for headaches.  All other systems reviewed and are negative.    Allergies  Review of patient's allergies indicates no known allergies.  Home Medications   Current Outpatient Rx  Name  Route  Sig  Dispense  Refill  . ALBUTEROL SULFATE HFA 108 (90 BASE) MCG/ACT IN AERS   Inhalation   Inhale 1-2 puffs into the lungs every 6 (six) hours as needed for wheezing.   1 Inhaler   0   . AMLODIPINE BESYLATE 2.5 MG PO TABS   Oral   Take 2.5 mg by mouth daily.         . ANASTROZOLE 1 MG PO TABS   Oral   Take 1 tablet (1 mg total) by mouth daily.   90 tablet   3   . AZITHROMYCIN 250 MG PO TABS      Take as directed.   6 tablet   0   . DICLOFENAC SODIUM 50 MG PO TBEC   Oral   Take 50 mg by mouth 2 (two) times daily.         . IRBESARTAN 150 MG PO TABS   Oral   Take 150 mg by mouth at bedtime.         . OSELTAMIVIR PHOSPHATE 75 MG PO CAPS   Oral   Take 1 capsule (75 mg total) by mouth every 12 (twelve) hours.   10 capsule   0   . PANTOPRAZOLE SODIUM 40 MG PO TBEC   Oral  Take 40 mg by mouth daily.         Marland Kitchen POTASSIUM BICARBONATE 25 MEQ PO TBEF      DISSOLVE TWO TABS IN BEVERAGE IN THE MORNING AND THEN DISSOLVE TWO TABS IN BEVERAGE IN THE EVENING.   120 tablet   3   . VENLAFAXINE HCL ER 75 MG PO CP24   Oral   Take 1 capsule (75 mg total) by mouth daily.   30 capsule   5   . BENZONATATE 100 MG PO CAPS   Oral   Take 1 capsule (100 mg total) by mouth every 8 (eight) hours.   21 capsule   0   . PREDNISONE 10 MG PO TABS   Oral   Take 2 tablets (20 mg total) by mouth 2 (two) times daily.   8 tablet   0   . TRAMADOL HCL 50 MG PO TABS   Oral   Take 2 tablets (100 mg total) by mouth every 8 (eight) hours as needed for pain.   30 tablet   0     BP 144/85  Pulse 95  Temp 98.8 F (37.1 C) (Oral)  Resp 20  SpO2 96%  Physical Exam  Nursing note and vitals reviewed. Constitutional: She is oriented to person, place, and time. Vital signs  are normal. She appears well-developed and well-nourished. She is active and cooperative.  HENT:  Head: Normocephalic.  Right Ear: External ear normal.  Left Ear: Tympanic membrane is scarred.  Nose: Rhinorrhea present. Right sinus exhibits no maxillary sinus tenderness and no frontal sinus tenderness. Left sinus exhibits no maxillary sinus tenderness and no frontal sinus tenderness.  Mouth/Throat: Uvula is midline, oropharynx is clear and moist and mucous membranes are normal.  Eyes: Conjunctivae normal are normal. Pupils are equal, round, and reactive to light. No scleral icterus.  Neck: Trachea normal, normal range of motion and full passive range of motion without pain. Neck supple. Carotid bruit is not present.  Cardiovascular: Normal rate, regular rhythm, normal heart sounds, intact distal pulses and normal pulses.   Pulmonary/Chest: Effort normal. She has wheezes. She has rhonchi. She exhibits tenderness.  Abdominal: Soft. Bowel sounds are normal. There is no tenderness.  Lymphadenopathy:       Head (right side): No submental, no submandibular, no tonsillar, no preauricular, no posterior auricular and no occipital adenopathy present.       Head (left side): No submental, no submandibular, no tonsillar, no preauricular, no posterior auricular and no occipital adenopathy present.    She has no cervical adenopathy.    She has no axillary adenopathy.  Neurological: She is alert and oriented to person, place, and time. She has normal strength. No cranial nerve deficit or sensory deficit. GCS eye subscore is 4. GCS verbal subscore is 5. GCS motor subscore is 6.  Skin: Skin is warm and dry. No rash noted.  Psychiatric: She has a normal mood and affect. Her speech is normal and behavior is normal. Judgment and thought content normal. Cognition and memory are normal.    ED Course  Procedures (including critical care time)  Labs Reviewed - No data to display No results found. EKG- Sinus  tachycardia Possible Left atrial enlargement Left ventricular hypertrophy Nonspecific ST and T wave abnormality Abnormal ECG  1. Bronchitis   2. Cough, persistent   3. Costochondritis       MDM  Albuterol/atrovent nebulizer administered in office.   1130 pt noted vomiting and diaphoretic in office, recheck temp  98.3, reports breathing improved after neb tx, continued wheezing noted.   1212 nausea improved Discussed with Dr. Shela Commons. Kindl.  Continue tamiflu until completed, begin medications as prescribed.  Follow up with pcp if symptoms are not improved.        Johnsie Kindred, NP 03/31/12 1317

## 2012-03-31 NOTE — ED Notes (Signed)
Pt c/o a persistent productive cough with green sputum. Hot/cold chills. Decrease in appetite. Recent dx of bronchitis last Thursday. Pt states that medication is not working symptoms have gotten worse.

## 2012-04-02 NOTE — ED Provider Notes (Signed)
Medical screening examination/treatment/procedure(s) were performed by resident physician or non-physician practitioner and as supervising physician I was immediately available for consultation/collaboration.   Hines Kloss DOUGLAS MD.    Arnella Pralle D Gregory Barrick, MD 04/02/12 2037 

## 2012-04-10 ENCOUNTER — Other Ambulatory Visit: Payer: Medicaid Other

## 2012-04-25 ENCOUNTER — Emergency Department (HOSPITAL_COMMUNITY): Payer: Medicaid Other

## 2012-04-25 ENCOUNTER — Encounter (HOSPITAL_COMMUNITY): Payer: Self-pay | Admitting: Emergency Medicine

## 2012-04-25 ENCOUNTER — Emergency Department (HOSPITAL_COMMUNITY)
Admission: EM | Admit: 2012-04-25 | Discharge: 2012-04-25 | Disposition: A | Discharge status: Home / Self Care | Payer: Medicaid Other | Attending: Emergency Medicine | Admitting: Emergency Medicine

## 2012-04-25 DIAGNOSIS — I10 Essential (primary) hypertension: Secondary | ICD-10-CM | POA: Insufficient documentation

## 2012-04-25 DIAGNOSIS — Z79899 Other long term (current) drug therapy: Secondary | ICD-10-CM | POA: Insufficient documentation

## 2012-04-25 DIAGNOSIS — C50919 Malignant neoplasm of unspecified site of unspecified female breast: Secondary | ICD-10-CM | POA: Insufficient documentation

## 2012-04-25 DIAGNOSIS — E876 Hypokalemia: Secondary | ICD-10-CM | POA: Insufficient documentation

## 2012-04-25 DIAGNOSIS — F1210 Cannabis abuse, uncomplicated: Secondary | ICD-10-CM

## 2012-04-25 DIAGNOSIS — F10929 Alcohol use, unspecified with intoxication, unspecified: Secondary | ICD-10-CM

## 2012-04-25 DIAGNOSIS — R109 Unspecified abdominal pain: Secondary | ICD-10-CM

## 2012-04-25 DIAGNOSIS — M2569 Stiffness of other specified joint, not elsewhere classified: Secondary | ICD-10-CM

## 2012-04-25 DIAGNOSIS — M549 Dorsalgia, unspecified: Secondary | ICD-10-CM

## 2012-04-25 DIAGNOSIS — R404 Transient alteration of awareness: Secondary | ICD-10-CM | POA: Insufficient documentation

## 2012-04-25 DIAGNOSIS — S3981XA Other specified injuries of abdomen, initial encounter: Secondary | ICD-10-CM | POA: Insufficient documentation

## 2012-04-25 DIAGNOSIS — M5380 Other specified dorsopathies, site unspecified: Secondary | ICD-10-CM | POA: Insufficient documentation

## 2012-04-25 DIAGNOSIS — F1010 Alcohol abuse, uncomplicated: Secondary | ICD-10-CM

## 2012-04-25 DIAGNOSIS — Z791 Long term (current) use of non-steroidal anti-inflammatories (NSAID): Secondary | ICD-10-CM | POA: Insufficient documentation

## 2012-04-25 DIAGNOSIS — F1410 Cocaine abuse, uncomplicated: Secondary | ICD-10-CM

## 2012-04-25 DIAGNOSIS — IMO0002 Reserved for concepts with insufficient information to code with codable children: Secondary | ICD-10-CM | POA: Insufficient documentation

## 2012-04-25 DIAGNOSIS — K7689 Other specified diseases of liver: Secondary | ICD-10-CM | POA: Insufficient documentation

## 2012-04-25 LAB — CBC WITH DIFFERENTIAL/PLATELET
Eosinophils Absolute: 0 10*3/uL (ref 0.0–0.7)
Lymphocytes Relative: 37 % (ref 12–46)
Lymphs Abs: 1.9 10*3/uL (ref 0.7–4.0)
Neutro Abs: 2.8 10*3/uL (ref 1.7–7.7)
Neutrophils Relative %: 54 % (ref 43–77)
Platelets: 283 10*3/uL (ref 150–400)
RBC: 3.98 MIL/uL (ref 3.87–5.11)
WBC: 5.1 10*3/uL (ref 4.0–10.5)

## 2012-04-25 LAB — URINALYSIS, MICROSCOPIC ONLY
Ketones, ur: NEGATIVE mg/dL
Leukocytes, UA: NEGATIVE
Nitrite: NEGATIVE
Specific Gravity, Urine: 1.008 (ref 1.005–1.030)
pH: 5 (ref 5.0–8.0)

## 2012-04-25 LAB — COMPREHENSIVE METABOLIC PANEL
Alkaline Phosphatase: 66 U/L (ref 39–117)
BUN: 6 mg/dL (ref 6–23)
Calcium: 9 mg/dL (ref 8.4–10.5)
Creatinine, Ser: 0.8 mg/dL (ref 0.50–1.10)
GFR calc Af Amer: >90 mL/min (ref >90)
Glucose, Bld: 117 mg/dL — ABNORMAL HIGH (ref 70–99)
Total Protein: 8 g/dL (ref 6.0–8.3)

## 2012-04-25 LAB — TYPE AND SCREEN: ABO/RH(D): O POS

## 2012-04-25 LAB — RAPID URINE DRUG SCREEN, HOSP PERFORMED
Amphetamines: NOT DETECTED
Benzodiazepines: NOT DETECTED
Cocaine: POSITIVE — AB
Opiates: NOT DETECTED
Tetrahydrocannabinol: POSITIVE — AB

## 2012-04-25 LAB — POCT I-STAT, CHEM 8
HCT: 40 % (ref 36.0–46.0)
Hemoglobin: 13.6 g/dL (ref 12.0–15.0)
Potassium: 3.7 mEq/L (ref 3.5–5.1)
Sodium: 141 mEq/L (ref 135–145)
TCO2: 22 mmol/L (ref 0–100)

## 2012-04-25 LAB — PROTIME-INR: Prothrombin Time: 12.2 seconds (ref 11.6–15.2)

## 2012-04-25 MED ORDER — HYDROMORPHONE HCL PF 1 MG/ML IJ SOLN
0.5000 mg | Freq: Once | INTRAMUSCULAR | Status: AC
  Administered 2012-04-25: 0.5 mg via INTRAVENOUS
  Filled 2012-04-25: qty 1

## 2012-04-25 MED ORDER — HYDROCODONE-ACETAMINOPHEN 5-325 MG PO TABS: 1.0000 | ORAL_TABLET | Freq: Four times a day (QID) | ORAL | Status: DC | PRN

## 2012-04-25 MED ORDER — IOHEXOL 300 MG/ML  SOLN
100.0000 mL | Freq: Once | INTRAMUSCULAR | Status: AC | PRN
  Administered 2012-04-25: 100 mL via INTRAVENOUS

## 2012-04-25 MED ORDER — DIAZEPAM 5 MG/ML IJ SOLN
5.0000 mg | Freq: Once | INTRAMUSCULAR | Status: AC
  Administered 2012-04-25: 5 mg via INTRAVENOUS
  Filled 2012-04-25: qty 2

## 2012-04-25 MED ORDER — CYCLOBENZAPRINE HCL 10 MG PO TABS: 10.0000 mg | ORAL_TABLET | Freq: Two times a day (BID) | ORAL | Status: DC | PRN

## 2012-04-25 MED ORDER — ONDANSETRON HCL 4 MG/2ML IJ SOLN
4.0000 mg | Freq: Once | INTRAMUSCULAR | Status: AC
  Administered 2012-04-25: 4 mg via INTRAVENOUS
  Filled 2012-04-25: qty 2

## 2012-04-25 NOTE — ED Notes (Signed)
Pt given ice pack

## 2012-04-25 NOTE — Discharge Instructions (Signed)
Abdominal Pain Abdominal pain can be caused by many things. Your caregiver decides the seriousness of your pain by an examination and possibly blood tests and X-rays. Many cases can be observed and treated at home. Most abdominal pain is not caused by a disease and will probably improve without treatment. However, in many cases, more time must pass before a clear cause of the pain can be found. Before that point, it may not be known if you need more testing, or if hospitalization or surgery is needed. HOME CARE INSTRUCTIONS   Do not take laxatives unless directed by your caregiver.  Take pain medicine only as directed by your caregiver.  Only take over-the-counter or prescription medicines for pain, discomfort, or fever as directed by your caregiver.  Try a clear liquid diet (broth, tea, or water) for as long as directed by your caregiver. Slowly move to a bland diet as tolerated. SEEK IMMEDIATE MEDICAL CARE IF:   The pain does not go away.  You have a fever.  You keep throwing up (vomiting).  The pain is felt only in portions of the abdomen. Pain in the right side could possibly be appendicitis. In an adult, pain in the left lower portion of the abdomen could be colitis or diverticulitis.  You pass bloody or black tarry stools. MAKE SURE YOU:   Understand these instructions.  Will watch your condition.  Will get help right away if you are not doing well or get worse. Document Released: 12/26/2004 Document Revised: 06/10/2011 Document Reviewed: 11/04/2007 [facility] Patient Information 2013 [place], [place]. Alcohol Intoxication You have alcohol intoxication when the amount of alcohol that you have consumed has impaired your ability to mentally and physically function. There are a variety of factors that contribute to the level at which alcohol intoxication can occur, such as age, gender, weight, frequency of alcohol consumption, medication use, and the presence of other medical  conditions, such as diabetes, seizures, or heart conditions. The blood alcohol level test measures the concentration of alcohol in your blood. In most states, your blood alcohol level must be lower than 80 mg/dL (1.47%) to legally drive. However, many dangerous effects of alcohol can occur at much lower levels. Alcohol directly impairs the normal chemical activity of the brain and is said to be a chemical depressant. Alcohol can cause drowsiness, stupor, respiratory failure, and coma. Other physical effects can include headache, vomiting, vomiting of blood, abdominal pain, a fast heartbeat, difficulty breathing, anxiety, and amnesia. Alcohol intoxication can also lead to dangerous and life-threatening activities, such as fighting, dangerous operation of vehicles or heavy machinery, and risky sexual behavior. Alcohol can be especially dangerous when taken with other drugs. Some of these drugs are:  Sedatives.  Painkillers.  Marijuana.  Tranquilizers.  Antihistamines.  Muscle relaxants.  Seizure medicine. Many of the effects of acute alcohol intoxication are temporary. However, repeated alcohol intoxication can lead to severe medical illnesses. If you have alcohol intoxication, you should:  Stay hydrated. Drink enough water and fluids to keep your urine clear or pale yellow. Avoid excessive caffeine because this can further lead to dehydration.  Eat a healthy diet. You may have residual nausea, headache, and loss of appetite, but it is still important that you maintain good nutrition. You can start with clear liquids.  Take nonsteroidal anti-inflammatory medications as needed for headaches, but make sure to do so with small meals. You should avoid acetaminophen for several days after having alcohol intoxication because the combination of alcohol and acetaminophen can  be toxic to your liver. If you have frequent alcohol intoxication, ask your friends and family if they think you have a drinking  problem. For further help, contact:  Your caregiver.  Alcoholics Anonymous (AA).  A drug or alcohol rehabilitation program. SEEK MEDICAL CARE IF:   You have persistent vomiting.  You have persistent pain in any part of your body.  You do not feel better after a few days. SEEK IMMEDIATE MEDICAL CARE IF:   You become shaky or tremble when you try to stop drinking.  You shake uncontrollably (seizure).  You throw up (vomit) blood. This may be bright red or it may look like black coffee grounds.  You have blood in the stool. This may be bright red or appear as a black, tarry, bad smelling stool.  You become lightheaded or faint. ANY OF THESE SYMPTOMS MAY REPRESENT A SERIOUS PROBLEM THAT IS AN EMERGENCY. Do not wait to see if the symptoms will go away. Get medical help right away. Call your local emergency services (911 in [place] DO NOT drive yourself to the hospital. MAKE SURE YOU:   Understand these instructions.  Will watch your condition.  Will get help right away if you are not doing well or get worse. Document Released: 12/26/2004 Document Revised: 06/10/2011 Document Reviewed: 09/04/2009 [facility] Patient Information 2013 [place], [place]. Alcohol Intoxication You have alcohol intoxication when the amount of alcohol that you have consumed has impaired your ability to mentally and physically function. There are a variety of factors that contribute to the level at which alcohol intoxication can occur, such as age, gender, weight, frequency of alcohol consumption, medication use, and the presence of other medical conditions, such as diabetes, seizures, or heart conditions. The blood alcohol level test measures the concentration of alcohol in your blood. In most states, your blood alcohol level must be lower than 80 mg/dL (1.61%) to legally drive. However, many dangerous effects of alcohol can occur at much lower levels. Alcohol directly impairs the normal chemical activity of the  brain and is said to be a chemical depressant. Alcohol can cause drowsiness, stupor, respiratory failure, and coma. Other physical effects can include headache, vomiting, vomiting of blood, abdominal pain, a fast heartbeat, difficulty breathing, anxiety, and amnesia. Alcohol intoxication can also lead to dangerous and life-threatening activities, such as fighting, dangerous operation of vehicles or heavy machinery, and risky sexual behavior. Alcohol can be especially dangerous when taken with other drugs. Some of these drugs are:  Sedatives.  Painkillers.  Marijuana.  Tranquilizers.  Antihistamines.  Muscle relaxants.  Seizure medicine. Many of the effects of acute alcohol intoxication are temporary. However, repeated alcohol intoxication can lead to severe medical illnesses. If you have alcohol intoxication, you should:  Stay hydrated. Drink enough water and fluids to keep your urine clear or pale yellow. Avoid excessive caffeine because this can further lead to dehydration.  Eat a healthy diet. You may have residual nausea, headache, and loss of appetite, but it is still important that you maintain good nutrition. You can start with clear liquids.  Take nonsteroidal anti-inflammatory medications as needed for headaches, but make sure to do so with small meals. You should avoid acetaminophen for several days after having alcohol intoxication because the combination of alcohol and acetaminophen can be toxic to your liver. If you have frequent alcohol intoxication, ask your friends and family if they think you have a drinking problem. For further help, contact:  Your caregiver.  Alcoholics Anonymous (AA).  A drug  or alcohol rehabilitation program. SEEK MEDICAL CARE IF:   You have persistent vomiting.  You have persistent pain in any part of your body.  You do not feel better after a few days. SEEK IMMEDIATE MEDICAL CARE IF:   You become shaky or tremble when you try to stop  drinking.  You shake uncontrollably (seizure).  You throw up (vomit) blood. This may be bright red or it may look like black coffee grounds.  You have blood in the stool. This may be bright red or appear as a black, tarry, bad smelling stool.  You become lightheaded or faint. ANY OF THESE SYMPTOMS MAY REPRESENT A SERIOUS PROBLEM THAT IS AN EMERGENCY. Do not wait to see if the symptoms will go away. Get medical help right away. Call your local emergency services (911 in [place] DO NOT drive yourself to the hospital. MAKE SURE YOU:   Understand these instructions.  Will watch your condition.  Will get help right away if you are not doing well or get worse. Document Released: 12/26/2004 Document Revised: 06/10/2011 Document Reviewed: 09/04/2009 [facility] Patient Information 2013 [place], [place]. Back Pain, Adult Low back pain is very common. About 1 in 5 people have back pain.The cause of low back pain is rarely dangerous. The pain often gets better over time.About half of people with a sudden onset of back pain feel better in just 2 weeks. About 8 in 10 people feel better by 6 weeks.  CAUSES Some common causes of back pain include:  Strain of the muscles or ligaments supporting the spine.  Wear and tear (degeneration) of the spinal discs.  Arthritis.  Direct injury to the back. DIAGNOSIS Most of the time, the direct cause of low back pain is not known.However, back pain can be treated effectively even when the exact cause of the pain is unknown.Answering your caregiver's questions about your overall health and symptoms is one of the most accurate ways to make sure the cause of your pain is not dangerous. If your caregiver needs more information, he or she may order lab work or imaging tests (X-rays or MRIs).However, even if imaging tests show changes in your back, this usually does not require surgery. HOME CARE INSTRUCTIONS For many people, back pain returns.Since low back pain  is rarely dangerous, it is often a condition that people can learn to [facility] their own.   Remain active. It is stressful on the back to sit or stand in one place. Do not sit, drive, or stand in one place for more than 30 minutes at a time. Take short walks on level surfaces as soon as pain allows.Try to increase the length of time you walk each day.  Do not stay in bed.Resting more than 1 or 2 days can delay your recovery.  Do not avoid exercise or work.Your body is made to move.It is not dangerous to be active, even though your back may hurt.Your back will likely heal faster if you return to being active before your pain is gone.  Pay attention to your body when you bend and lift. Many people have less discomfortwhen lifting if they bend their knees, keep the load close to their bodies,and avoid twisting. Often, the most comfortable positions are those that put less stress on your recovering back.  Find a comfortable position to sleep. Use a firm mattress and lie on your side with your knees slightly bent. If you lie on your back, put a pillow under your knees.  Only take  over-the-counter or prescription medicines as directed by your caregiver. Over-the-counter medicines to reduce pain and inflammation are often the most helpful.Your caregiver may prescribe muscle relaxant drugs.These medicines help dull your pain so you can more quickly return to your normal activities and healthy exercise.  Put ice on the injured area.  Put ice in a plastic bag.  Place a towel between your skin and the bag.  Leave the ice on for 15 to 20 minutes, 3 to 4 times a day for the first 2 to 3 days. After that, ice and heat may be alternated to reduce pain and spasms.  Ask your caregiver about trying back exercises and gentle massage. This may be of some benefit.  Avoid feeling anxious or stressed.Stress increases muscle tension and can worsen back pain.It is important to recognize when you are  anxious or stressed and learn ways to manage it.Exercise is a great option. SEEK MEDICAL CARE IF:  You have pain that is not relieved with rest or medicine.  You have pain that does not improve in 1 week.  You have new symptoms.  You are generally not feeling well. SEEK IMMEDIATE MEDICAL CARE IF:   You have pain that radiates from your back into your legs.  You develop new bowel or bladder control problems.  You have unusual weakness or numbness in your arms or legs.  You develop nausea or vomiting.  You develop abdominal pain.  You feel faint. Document Released: 03/18/2005 Document Revised: 09/17/2011 Document Reviewed: 08/06/2010 [facility] Patient Information 2013 [place], [place].
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
## 2012-06-04 ENCOUNTER — Other Ambulatory Visit: Payer: Self-pay | Admitting: Physician Assistant

## 2012-06-04 DIAGNOSIS — Z1231 Encounter for screening mammogram for malignant neoplasm of breast: Secondary | ICD-10-CM

## 2012-06-11 ENCOUNTER — Ambulatory Visit (HOSPITAL_COMMUNITY): Payer: Medicaid Other

## 2012-06-23 ENCOUNTER — Inpatient Hospital Stay: Admission: RE | Admit: 2012-06-23 | Payer: Medicaid Other | Source: Ambulatory Visit

## 2012-06-24 ENCOUNTER — Telehealth: Payer: Self-pay | Admitting: *Deleted

## 2012-06-24 NOTE — Telephone Encounter (Signed)
Returned pt call.Carrie [name] the number for the [facility] for her to reschedule her appts.

## 2012-07-06 ENCOUNTER — Ambulatory Visit
Admission: RE | Admit: 2012-07-06 | Discharge: 2012-07-06 | Disposition: A | Discharge status: Home / Self Care | Payer: Medicaid Other | Source: Ambulatory Visit | Attending: Physician Assistant | Admitting: Physician Assistant

## 2012-07-06 DIAGNOSIS — Z853 Personal history of malignant neoplasm of breast: Secondary | ICD-10-CM

## 2012-07-06 DIAGNOSIS — Z780 Asymptomatic menopausal state: Secondary | ICD-10-CM

## 2012-07-14 ENCOUNTER — Other Ambulatory Visit (HOSPITAL_BASED_OUTPATIENT_CLINIC_OR_DEPARTMENT_OTHER): Payer: Medicaid Other | Admitting: Lab

## 2012-07-14 DIAGNOSIS — C50912 Malignant neoplasm of unspecified site of left female breast: Secondary | ICD-10-CM

## 2012-07-14 DIAGNOSIS — C50119 Malignant neoplasm of central portion of unspecified female breast: Secondary | ICD-10-CM

## 2012-07-14 LAB — COMPREHENSIVE METABOLIC PANEL (CC13)
Alkaline Phosphatase: 67 U/L (ref 40–150)
BUN: 7.3 mg/dL (ref 7.0–26.0)
Glucose: 94 mg/dl (ref 70–99)
Sodium: 136 mEq/L (ref 136–145)
Total Bilirubin: 0.67 mg/dL (ref 0.20–1.20)

## 2012-07-14 LAB — CBC WITH DIFFERENTIAL/PLATELET
Basophils Absolute: 0.1 10*3/uL (ref 0.0–0.1)
Eosinophils Absolute: 0.1 10*3/uL (ref 0.0–0.5)
LYMPH%: 35 % (ref 14.0–49.7)
MCV: 91.7 fL (ref 79.5–101.0)
MONO%: 14.1 % — ABNORMAL HIGH (ref 0.0–14.0)
NEUT#: 2.3 10*3/uL (ref 1.5–6.5)
Platelets: 311 10*3/uL (ref 145–400)
RBC: 4.55 10*6/uL (ref 3.70–5.45)

## 2012-07-21 ENCOUNTER — Telehealth: Payer: Self-pay | Admitting: *Deleted

## 2012-07-21 ENCOUNTER — Ambulatory Visit (HOSPITAL_BASED_OUTPATIENT_CLINIC_OR_DEPARTMENT_OTHER): Payer: Medicaid Other | Admitting: Oncology

## 2012-07-21 VITALS — BP 170/94 | HR 93 | Temp 97.5°F | Resp 20 | Ht 60.5 in | Wt 176.0 lb

## 2012-07-21 DIAGNOSIS — Z170 Estrogen receptor positive status [ER+]: Secondary | ICD-10-CM

## 2012-07-21 DIAGNOSIS — C50119 Malignant neoplasm of central portion of unspecified female breast: Secondary | ICD-10-CM

## 2012-07-21 DIAGNOSIS — C50912 Malignant neoplasm of unspecified site of left female breast: Secondary | ICD-10-CM

## 2012-07-21 DIAGNOSIS — R970 Elevated carcinoembryonic antigen [CEA]: Secondary | ICD-10-CM

## 2012-07-21 MED ORDER — ANASTROZOLE 1 MG PO TABS: 1.0000 mg | ORAL_TABLET | Freq: Every day | ORAL | Status: DC

## 2012-07-21 NOTE — Telephone Encounter (Signed)
appts made and printed...td 

## 2012-07-21 NOTE — Progress Notes (Signed)
ID: Carrie [name]   DOB: Jan 11, 1951  MR#: [id number]  JXB#:[id number]  PCP: Bradd Canary GYN: Laurette Schimke, Beather Arbour SU: Claud Kelp OTHER MD: Charlott Rakes  HISTORY OF PRESENT ILLNESS: .  Carrie [name] herself palpated a mass in her right breast.  She brought it to the attention of Dr Beather Arbour and he set her up for diagnostic mammography at the [facility], which was performed March 13, 2009.  Dr Deboraha Sprang at that exam was able to palpate a pea-size nodule at the 12 o'clock position in the subareolar region of the right breast.  On mammography, the breast was largely fatty and there was a tiny subcutaneous nodule in the right subareolar region.  There were no calcifications associated with this and no other mammographic abnormalities.  By ultrasound, the nodule in question had ill-defined borders and measured 6 mm.  Sonography of the right axilla showed no abnormal lymph nodes.  Biopsy was suggested and was performed on February 4.  The pathology from that procedure ([id number]) showed an invasive ductal carcinoma described as high grade by Dr Delila Spence.    With this information, the patient was set up for bilateral breast MRIs on February 14 and this showed in the upper central portion of the right breast a 13 mm lesion correlating well with the ultrasound and mammogram previously described.  The left breast was unremarkable and there was no adenopathy noted.  Incidentally, in the right liver, there was a 2 cm lesion which showed enhancement post gadolinium but could not be further characterized.  Statistically this was felt to be a hemangioma but certainly will require further evaluation.  With this information, the patient proceeded to right lumpectomy and sentinel lymph node sampling May 20, 2009, under Dr Derrell Lolling.  The final pathology from this procedure ([id number]) showed a 1.3 cm invasive ductal carcinoma, grade 1, with no evidence of lymphovascular invasion and ample margins.   Two sentinel lymph nodes were clear. Tumor was ER positive, PR negative, and HER-2/neu negative, with an MIB-1 of 3%.  Patient received radiation therapy which was completed in May of 2011. At that time she was started on anastrozole, 1 mg daily, and continues with good tolerance.  INTERVAL HISTORY: The patient returns today for followup of her breast cancer. The interval history is unremarkable. She obtains anastrozole for $3 a month and has some hot flashes as the only side effect she is aware of.Marland Kitchen  REVIEW OF SYSTEMS: She sleeps poorly, has seasonal allergy issues, feels that the anastrozole is causing her to be nauseated and to lose her appetite. She is a ready scheduled for colonoscopy in May under Dr. Bosie Clos. She does have some joint and back pain which is chronic, and not more intense or continuous than before. She denies anxiety or depression. A detailed review of systems was otherwise noncontributory  PAST MEDICAL HISTORY: Past Medical History  Diagnosis Date  . Breast cancer, left breast 07/17/2011  . Hypokalemia 07/17/2011  . Hepatic steatosis 07/17/2011    PAST SURGICAL HISTORY: No past surgical history on file.  FAMILY HISTORY Family History  Problem Relation Age of Onset  . Cancer Other    There is no history of breast, ovarian or any other cancer in the family to the patient's knowledge.  GYNECOLOGIC HISTORY:  She is GX, P0.  Last menstrual period was approximately 6 years ago.  She was not on hormone replacement.  She is having fairly significant hot flashes.    SOCIAL HISTORY:  She  works as a Advertising copywriter.  She is single.  Lives alone.  Has no pets.  Is not a church attender.     ADVANCED DIRECTIVES:  HEALTH MAINTENANCE: History  Substance Use Topics  . Smoking status: Never Smoker   . Smokeless tobacco: Not on file  . Alcohol Use: 18.0 oz/week    30 Cans of beer per week     Colonoscopy:  PAP:  Bone density: 07/06/2012/normal  Lipid panel:  No Known  Allergies  Current Outpatient Prescriptions  Medication Sig Dispense Refill  . albuterol (PROVENTIL HFA;VENTOLIN HFA) 108 (90 BASE) MCG/ACT inhaler Inhale 1-2 puffs into the lungs every 6 (six) hours as needed. For wheezing      . amLODipine (NORVASC) 2.5 MG tablet Take 2.5 mg by mouth daily.      Marland Kitchen anastrozole (ARIMIDEX) 1 MG tablet Take 1 tablet (1 mg total) by mouth daily.  90 tablet  3  . Chlorpheniramine-APAP (CORICIDIN) 2-325 MG TABS Take 2 tablets by mouth 2 (two) times daily as needed. For bronchitis symptoms      . cyclobenzaprine (FLEXERIL) 10 MG tablet Take 1 tablet (10 mg total) by mouth 2 (two) times daily as needed for muscle spasms.  20 tablet  0  . HYDROcodone-acetaminophen (NORCO/VICODIN) 5-325 MG per tablet Take 1-2 tablets by mouth every 6 (six) hours as needed for pain.  17 tablet  0   No current facility-administered medications for this visit.    OBJECTIVE: Filed Vitals:   07/21/12 1402  BP: 170/94  Pulse: 93  Temp: 97.5 F (36.4 C)  Resp: 20     Body mass index is 33.79 kg/(m^2).    ECOG FS: 0 Filed Weights   07/21/12 1402  Weight: 176 lb (79.833 kg)   Sclerae unicteric Oropharynx clear No cervical or supraclavicular adenopathy Lungs no rales or rhonchi Heart regular rate and rhythm Abd benign MSK no focal spinal tenderness, no peripheral edema Neuro: nonfocal, well oriented, friendly affect Breasts: The right breast is status post lumpectomy and radiation. There is no evidence of local recurrence. The right axilla is benign. The left breast is unremarkable.      LAB RESULTS: Lab Results  Component Value Date   WBC 4.8 07/14/2012   NEUTROABS 2.3 07/14/2012   HGB 14.1 07/14/2012   HCT 41.7 07/14/2012   MCV 91.7 07/14/2012   PLT 311 07/14/2012      Chemistry      Component Value Date/Time   NA 136 07/14/2012 1400   NA 141 04/25/2012 0410   K 4.0 07/14/2012 1400   K 3.7 04/25/2012 0410   CL 98 07/14/2012 1400   CL 106 04/25/2012 0410   CO2 27  07/14/2012 1400   CO2 22 04/25/2012 0404   BUN 7.3 07/14/2012 1400   BUN 4* 04/25/2012 0410   CREATININE 0.9 07/14/2012 1400   CREATININE 1.30* 04/25/2012 0410      Component Value Date/Time   CALCIUM 9.8 07/14/2012 1400   CALCIUM 9.0 04/25/2012 0404   ALKPHOS 67 07/14/2012 1400   ALKPHOS 66 04/25/2012 0404   AST 47* 07/14/2012 1400   AST 46* 04/25/2012 0404   ALT 51 07/14/2012 1400   ALT 30 04/25/2012 0404   BILITOT 0.67 07/14/2012 1400   BILITOT 0.2* 04/25/2012 0404       Lab Results  Component Value Date   LABCA2 84* 01/09/2012    STUDIES: Dg Bone Density  07/06/2012  *RADIOLOGY REPORT*  Clinical Data: Post menopausal osteoporosis screening.  DUAL X-RAY ABSORPTIOMETRY (DXA) FOR BONE MINERAL DENSITY  AP LUMBAR SPINE L1-L3  Bone Mineral Density (BMD):            1.215 g/cm2 Young Adult T Score:                          1.8 Z Score:                                                2.4  LEFT FEMUR NECK PAIN  Bone Mineral Density (BMD):             1.023 g/cm2 Young Adult T Score:                           1.6 Z Score:                                                 1.5  ASSESSMENT:  Patient's diagnostic category is NORMAL by WHO Criteria.  FRACTURE RISK: NOT INCREASED  FRAX: Not calculated due to a T score at or above -1.0  Comparison: 5.5% increase in bone density of the lumbar spine compared to 09/21/2009.  9.3% decrease in bone density over the total left hip compared to 2011.  RECOMMENDATIONS:  Effective therapies are available in the form of bisphosphonates, selective estrogen receptor modulators, biologic agents, and hormone replacement therapy (for women).  All patients should ensure an adequate intake of dietary calcium (1200mg  daily) and vitamin D (800 IU daily) unless contraindicated.  All treatment decisions require clinical judgement and consideration of individual patient factors, including patient preferences, co-morbidities, previous drug use, risk factors not captured in the FRAX model  (e.g., frailty, falls, vitamin D deficiency, increased bone turnover, interval significant decline in bone density) and possible under-or over-estimation of fracture risk by FRAX.  The National Osteoporosis Foundation recommends that FDA-approved medical therapies be considered in postmenopausal women and mean age 45 or older with a:        1)     Hip or vertebral (clinical or morphometric) fracture.           2)    T-score of -2.5 or lower at the spine or hip. 3)    Ten-year fracture probability by FRAX of 3% or greater for hip fracture or 20% or greater for major osteoporotic fracture. FOLLOW-UP:  People with diagnosed cases of osteoporosis or at high risk for fracture should have regular bone mineral density tests.  For patients eligible for Medicare, routine testing is allowed once every 2 years.  The testing frequency can be increased to one year for patients who have rapidly progressing disease, those who are receiving or discontinuing medical therapy to restore bone mass, or have additional risk factors.  World Science writer [facility]) Criteria:  Normal: T scores from +1.0 to -1.0 Low Bone Mass (Osteopenia): T scores between -1.0 and -2.5 Osteoporosis: T scores -2.5 and below  Comparison to Reference Population:  T score is the key measure used in the diagnosis of osteoporosis and relative risk determination for fracture.  It provides a value for bone mass relative to the mean bone mass of  a young adult reference population expressed in terms of standard deviation (SD).  Z score is the age-matched score showing the patient's values compared to a population matched for age, sex, and race.  This is also expressed in terms of standard deviation.  The patient may have values that compare favorably to the age-matched values and still be at increased risk for fracture.   Original Report Authenticated By: Elberta Fortis, M.D.    Mm Digital Diagnostic Bilat  07/06/2012  *RADIOLOGY REPORT*  Clinical Data:  History of  malignant lumpectomy of the right breast in 2011.  Annual reevaluation.  DIGITAL DIAGNOSTIC BILATERAL MAMMOGRAM WITH CAD  Comparison: Previous examinations  Findings:  ACR Breast Density Category 2: There is a scattered fibroglandular pattern.  There are stable mild scarring changes located within the right breast related to the patient's lumpectomy.  There is no specific evidence for recurrent tumor or developing malignancy within either breast.  Mammographic images were processed with CAD.  IMPRESSION: Stable parenchymal pattern.  No findings worrisome for malignancy.  RECOMMENDATION: Annual diagnostic mammography.  I have discussed the findings and recommendations with the patient. Results were also provided in writing at the conclusion of the visit.  If applicable, a reminder letter will be sent to the patient regarding her next appointment.  BI-RADS CATEGORY 1:  Negative.   Original Report Authenticated By: Rolla Plate, M.D.      ASSESSMENT: 62 y.o.  [place] woman with:  1. Breast cancer, status post right lumpectomy and sentinel lymph node biopsy February 2011 for a T1c N0 grade 1 invasive ductal carcinoma, estrogen receptor positive, progesterone receptor and HER2 negative, with an MIB-1 of 3%, status post radiation therapy completed May 2011, at which time she began anastrozole.  She continues on anastrozole with good tolerance and the plan is to continue that for 5 years.  2. History of an ovarian cyst on the right, the patient declining surgery and currently asymptomatic.  3. Persistently elevated CA27.29, stable, with no evidence of metastatic disease--no longer being followed, as per NCCN guidelins.   4. History of hepatic steatosis.   5. History of hypokalemia, now improved on "fizzy potassium" tablets.  6. Gastrointestinal complaints, followed by Dr. Bosie Clos 7. Arthritis, particularly involving the right knee, followed by Dr. Charlett Blake.     PLAN: The patient is doing well from a  breast cancer point of view, with no evidence of disease recurrence. The plan is to continue anastrozole for a total of 5 years. She will need a repeat bone density 2 years from now. She knows to call for any problems that may develop before the next visit.  MAGRINAT,GUSTAV C    07/21/2012

## 2012-08-31 ENCOUNTER — Other Ambulatory Visit: Payer: Self-pay | Admitting: Gastroenterology

## 2012-10-08 ENCOUNTER — Encounter: Payer: Self-pay | Admitting: Oncology

## 2013-01-25 ENCOUNTER — Other Ambulatory Visit: Payer: Self-pay | Admitting: Orthopedic Surgery

## 2013-01-25 DIAGNOSIS — M5410 Radiculopathy, site unspecified: Secondary | ICD-10-CM

## 2013-02-01 ENCOUNTER — Ambulatory Visit
Admission: RE | Admit: 2013-02-01 | Discharge: 2013-02-01 | Disposition: A | Discharge status: Home / Self Care | Payer: Medicaid Other | Source: Ambulatory Visit | Attending: Orthopedic Surgery | Admitting: Orthopedic Surgery

## 2013-02-01 DIAGNOSIS — M5410 Radiculopathy, site unspecified: Secondary | ICD-10-CM

## 2013-02-01 MED ORDER — GADOBENATE DIMEGLUMINE 529 MG/ML IV SOLN
16.0000 mL | Freq: Once | INTRAVENOUS | Status: AC | PRN
  Administered 2013-02-01: 16 mL via INTRAVENOUS

## 2013-02-02 ENCOUNTER — Other Ambulatory Visit: Payer: Self-pay | Admitting: Orthopedic Surgery

## 2013-02-02 DIAGNOSIS — N83209 Unspecified ovarian cyst, unspecified side: Secondary | ICD-10-CM

## 2013-02-04 ENCOUNTER — Ambulatory Visit
Admission: RE | Admit: 2013-02-04 | Discharge: 2013-02-04 | Disposition: A | Discharge status: Home / Self Care | Payer: Medicaid Other | Source: Ambulatory Visit | Attending: Orthopedic Surgery | Admitting: Orthopedic Surgery

## 2013-02-04 ENCOUNTER — Ambulatory Visit
Admission: RE | Admit: 2013-02-04 | Discharge: 2013-02-04 | Disposition: A | Discharge status: Home / Self Care | Payer: No Typology Code available for payment source | Source: Ambulatory Visit | Attending: Orthopedic Surgery | Admitting: Orthopedic Surgery

## 2013-02-04 DIAGNOSIS — N83209 Unspecified ovarian cyst, unspecified side: Secondary | ICD-10-CM

## 2013-07-14 ENCOUNTER — Other Ambulatory Visit: Payer: Self-pay | Admitting: Physician Assistant

## 2013-07-14 DIAGNOSIS — C50912 Malignant neoplasm of unspecified site of left female breast: Secondary | ICD-10-CM

## 2013-07-14 DIAGNOSIS — K760 Fatty (change of) liver, not elsewhere classified: Secondary | ICD-10-CM

## 2013-07-14 DIAGNOSIS — D649 Anemia, unspecified: Secondary | ICD-10-CM

## 2013-07-14 DIAGNOSIS — Z8639 Personal history of other endocrine, nutritional and metabolic disease: Secondary | ICD-10-CM

## 2013-07-14 DIAGNOSIS — E876 Hypokalemia: Secondary | ICD-10-CM

## 2013-07-15 ENCOUNTER — Other Ambulatory Visit (HOSPITAL_BASED_OUTPATIENT_CLINIC_OR_DEPARTMENT_OTHER): Payer: Commercial Managed Care - HMO

## 2013-07-15 DIAGNOSIS — Z8639 Personal history of other endocrine, nutritional and metabolic disease: Secondary | ICD-10-CM

## 2013-07-15 DIAGNOSIS — C50119 Malignant neoplasm of central portion of unspecified female breast: Secondary | ICD-10-CM

## 2013-07-15 DIAGNOSIS — C50912 Malignant neoplasm of unspecified site of left female breast: Secondary | ICD-10-CM

## 2013-07-15 DIAGNOSIS — K760 Fatty (change of) liver, not elsewhere classified: Secondary | ICD-10-CM

## 2013-07-15 DIAGNOSIS — D649 Anemia, unspecified: Secondary | ICD-10-CM

## 2013-07-15 DIAGNOSIS — E876 Hypokalemia: Secondary | ICD-10-CM

## 2013-07-15 LAB — COMPREHENSIVE METABOLIC PANEL (CC13)
ALBUMIN: 4.2 g/dL (ref 3.5–5.0)
ALK PHOS: 64 U/L (ref 40–150)
ALT: 51 U/L (ref 0–55)
AST: 56 U/L — ABNORMAL HIGH (ref 5–34)
Anion Gap: 12 mEq/L — ABNORMAL HIGH (ref 3–11)
BUN: 11 mg/dL (ref 7.0–26.0)
CO2: 25 mEq/L (ref 22–29)
Calcium: 10.2 mg/dL (ref 8.4–10.4)
Chloride: 99 mEq/L (ref 98–109)
Creatinine: 1.1 mg/dL (ref 0.6–1.1)
Glucose: 101 mg/dl (ref 70–140)
POTASSIUM: 3.5 meq/L (ref 3.5–5.1)
SODIUM: 136 meq/L (ref 136–145)
TOTAL PROTEIN: 8.2 g/dL (ref 6.4–8.3)
Total Bilirubin: 1.04 mg/dL (ref 0.20–1.20)

## 2013-07-15 LAB — CBC WITH DIFFERENTIAL/PLATELET
BASO%: 1 % (ref 0.0–2.0)
Basophils Absolute: 0 10*3/uL (ref 0.0–0.1)
EOS%: 1.4 % (ref 0.0–7.0)
Eosinophils Absolute: 0.1 10*3/uL (ref 0.0–0.5)
HCT: 39.1 % (ref 34.8–46.6)
HGB: 13.5 g/dL (ref 11.6–15.9)
LYMPH%: 32.2 % (ref 14.0–49.7)
MCH: 32.6 pg (ref 25.1–34.0)
MCHC: 34.5 g/dL (ref 31.5–36.0)
MCV: 94.6 fL (ref 79.5–101.0)
MONO#: 0.8 10*3/uL (ref 0.1–0.9)
MONO%: 15 % — AB (ref 0.0–14.0)
NEUT#: 2.5 10*3/uL (ref 1.5–6.5)
NEUT%: 50.4 % (ref 38.4–76.8)
Platelets: 267 10*3/uL (ref 145–400)
RBC: 4.13 10*6/uL (ref 3.70–5.45)
RDW: 14.1 % (ref 11.2–14.5)
WBC: 5 10*3/uL (ref 3.9–10.3)
lymph#: 1.6 10*3/uL (ref 0.9–3.3)

## 2013-07-16 LAB — VITAMIN D 25 HYDROXY (VIT D DEFICIENCY, FRACTURES): Vit D, 25-Hydroxy: 20 ng/mL — ABNORMAL LOW (ref 30–89)

## 2013-07-22 ENCOUNTER — Encounter: Payer: Self-pay | Admitting: Physician Assistant

## 2013-07-22 ENCOUNTER — Ambulatory Visit (HOSPITAL_BASED_OUTPATIENT_CLINIC_OR_DEPARTMENT_OTHER): Payer: Commercial Managed Care - HMO | Admitting: Physician Assistant

## 2013-07-22 ENCOUNTER — Telehealth: Payer: Self-pay | Admitting: Physician Assistant

## 2013-07-22 VITALS — BP 147/85 | HR 78 | Temp 97.9°F | Resp 18 | Ht 60.0 in | Wt 175.0 lb

## 2013-07-22 DIAGNOSIS — E559 Vitamin D deficiency, unspecified: Secondary | ICD-10-CM

## 2013-07-22 DIAGNOSIS — R2231 Localized swelling, mass and lump, right upper limb: Secondary | ICD-10-CM

## 2013-07-22 DIAGNOSIS — N83209 Unspecified ovarian cyst, unspecified side: Secondary | ICD-10-CM

## 2013-07-22 DIAGNOSIS — C50119 Malignant neoplasm of central portion of unspecified female breast: Secondary | ICD-10-CM

## 2013-07-22 DIAGNOSIS — K760 Fatty (change of) liver, not elsewhere classified: Secondary | ICD-10-CM

## 2013-07-22 DIAGNOSIS — Z170 Estrogen receptor positive status [ER+]: Secondary | ICD-10-CM

## 2013-07-22 DIAGNOSIS — Z853 Personal history of malignant neoplasm of breast: Secondary | ICD-10-CM

## 2013-07-22 DIAGNOSIS — C50912 Malignant neoplasm of unspecified site of left female breast: Secondary | ICD-10-CM

## 2013-07-22 DIAGNOSIS — R970 Elevated carcinoembryonic antigen [CEA]: Secondary | ICD-10-CM

## 2013-07-22 MED ORDER — ANASTROZOLE 1 MG PO TABS: 1.0000 mg | ORAL_TABLET | Freq: Every day | ORAL | Status: DC

## 2013-07-22 NOTE — Telephone Encounter (Signed)
, °

## 2013-07-22 NOTE — Progress Notes (Signed)
ID: RONIYAH LLORENS   DOB: 18-Aug-1950  MR#: [id number]  GDJ#:[id number]  PCP: Ihor Gully GYN: Janie Morning, Rexford Maus SU: Fanny Skates OTHER MD: Wilford Corner  CHIEF COMPLAINT:  Hx of Right Breast Cancer/On anastrazole    HISTORY OF PRESENT ILLNESS: Ms Dedic herself palpated a mass in her right breast.  She brought it to the attention of Dr Rexford Maus and he set her up for diagnostic mammography at the [facility], which was performed March 13, 2009.  Dr Sadie Haber at that exam was able to palpate a pea-size nodule at the 12 o'clock position in the subareolar region of the right breast.  On mammography, the breast was largely fatty and there was a tiny subcutaneous nodule in the right subareolar region.  There were no calcifications associated with this and no other mammographic abnormalities.  By ultrasound, the nodule in question had ill-defined borders and measured 6 mm.  Sonography of the right axilla showed no abnormal lymph nodes.  Biopsy was suggested and was performed on February 4.  The pathology from that procedure ([id number]) showed an invasive ductal carcinoma described as high grade by Dr Rodney Cruise.    With this information, the patient was set up for bilateral breast MRIs on February 14 and this showed in the upper central portion of the right breast a 13 mm lesion correlating well with the ultrasound and mammogram previously described.  The left breast was unremarkable and there was no adenopathy noted.  Incidentally, in the right liver, there was a 2 cm lesion which showed enhancement post gadolinium but could not be further characterized.  Statistically this was felt to be a hemangioma but certainly will require further evaluation.  With this information, the patient proceeded to right lumpectomy and sentinel lymph node sampling May 20, 2009, under Dr Dalbert Batman.  The final pathology from this procedure ([id number]) showed a 1.3 cm invasive ductal carcinoma, grade 1,  with no evidence of lymphovascular invasion and ample margins.  Two sentinel lymph nodes were clear. Tumor was ER positive, PR negative, and HER-2/neu negative, with an MIB-1 of 3%.  Patient received radiation therapy which was completed in May of 2011. At that time she was started on anastrozole, 1 mg daily, and continues with good tolerance.  Subsequent history is as detailed below.  INTERVAL HISTORY: Kaelene returns alone today for followup of her right breast cancer. She continues on anastrozole daily which she is tolerating well. She continues to have hot flashes at night, but nothing she would consider problematic. She also has some joint pain she attributes to arthritis, but this has not worsened. The pain is primarily in her back in her right knee. The pain is stable. She's had no vaginal changes, and specifically no vaginal dryness and no abnormal vaginal bleeding.  Tylan is concerned because she has felt some "lumps" in her right underarm.  Coraima continues to have some gastrointestinal issues which have been followed by Dr. Michail Sermon. This primarily consists of a decreased appetite, occasional nausea, and occasional emesis.  This issue has not worsened since her last appointment here one year ago, and overall her weight is stable as well.  REVIEW OF SYSTEMS: Nashira denies any recent illnesses and has had no fevers, chills, or night sweats. Her energy level is fair. She does have some sinus congestion she attributes to "year long allergies". She's had no cough, phlegm production, pleurisy, shortness of breath, peripheral swelling, chest pain, or palpitations. She's had no change in bowel or bladder  habits. She denies any abnormal headaches but has noticed a slight change in her vision. She's had no dizziness.  A detailed review of systems is otherwise stable and noncontributory.   PAST MEDICAL HISTORY: Past Medical History  Diagnosis Date  . Breast cancer, left breast 07/17/2011  . Hypokalemia  07/17/2011  . Hepatic steatosis 07/17/2011    PAST SURGICAL HISTORY: History reviewed. No pertinent past surgical history.  FAMILY HISTORY Family History  Problem Relation Age of Onset  . Cancer Other   There is no history of breast, ovarian or any other cancer in the family to the patient's knowledge.  GYNECOLOGIC HISTORY:  (Updated 07/22/2013) She is GX, P0.  Last menstrual period was approximately 6 years ago.  She was not on hormone replacement.  She is still having hot flashes.    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  She is single.  Lives alone.  Has no pets.  Is not a church attender.     ADVANCED DIRECTIVES:  HEALTH MAINTENANCE:  (updated 07/22/2013)  History  Substance Use Topics  . Smoking status: Never Smoker   . Smokeless tobacco: Never Used  . Alcohol Use: 18.0 oz/week    30 Cans of beer per week     Colonoscopy:  May 2014 [place]  PAP: Not on file   Bone density: 07/06/2012, normal  Lipid panel: Not on file   No Known Allergies  Current Outpatient Prescriptions  Medication Sig Dispense Refill  . amLODipine (NORVASC) 2.5 MG tablet Take 2.5 mg by mouth daily.      Marland Kitchen anastrozole (ARIMIDEX) 1 MG tablet Take 1 tablet (1 mg total) by mouth daily.  90 tablet  3  . cyclobenzaprine (FLEXERIL) 10 MG tablet Take 1 tablet (10 mg total) by mouth 2 (two) times daily as needed for muscle spasms.  20 tablet  0  . albuterol (PROVENTIL HFA;VENTOLIN HFA) 108 (90 BASE) MCG/ACT inhaler Inhale 1-2 puffs into the lungs every 6 (six) hours as needed. For wheezing       No current facility-administered medications for this visit.    OBJECTIVE: middle-aged [place] American female who appears comfortable  Filed Vitals:   07/22/13 1029  BP: 147/85  Pulse: 78  Temp: 97.9 F (36.6 C)  Resp: 18     Body mass index is 34.18 kg/(m^2).    ECOG FS: 0 Filed Weights   07/22/13 1029  Weight: 175 lb (79.379 kg)    Physical Exam: HEENT:  Sclerae anicteric.  Oropharynx clear and moist. Poor dentition.  Neck supple, trachea midline.  NODES:  No cervical or supraclavicular lymphadenopathy palpated.  BREAST EXAM: Right breast is status post lumpectomy with surgical removal of the nipple and areolar complex. There no suspicious nodularities or skin changes and no evidence of local recurrence. There are some small palpable nodules in the right axilla today, firm but movable. Left breast is unremarkable. Left axilla is benign with no palpable adenopathy. LUNGS:  Clear to auscultation bilaterally with good excursion.  No wheezes or rhonchi HEART:  Regular rate and rhythm. No murmur  ABDOMEN:  Soft, nontender. No hepatomegaly or masses. Positive bowel sounds.  MSK:  No focal spinal tenderness to palpation. Good range of motion bilaterally in the upper extremities. EXTREMITIES:  No peripheral edema.  No lymphedema in the right upper extremity. SKIN:  Benign with no rashes. No excessive ecchymoses. No petechiae. No jaundice. NEURO:  Nonfocal. Well oriented.  Appropriate affect.  LAB RESULTS: Lab Results  Component Value Date   WBC 5.0 07/15/2013   NEUTROABS 2.5 07/15/2013   HGB 13.5 07/15/2013   HCT 39.1 07/15/2013   MCV 94.6 07/15/2013   PLT 267 07/15/2013      Chemistry      Component Value Date/Time   NA 136 07/15/2013 1021   NA 141 04/25/2012 0410   K 3.5 07/15/2013 1021   K 3.7 04/25/2012 0410   CL 98 07/14/2012 1400   CL 106 04/25/2012 0410   CO2 25 07/15/2013 1021   CO2 22 04/25/2012 0404   BUN 11.0 07/15/2013 1021   BUN 4* 04/25/2012 0410   CREATININE 1.1 07/15/2013 1021   CREATININE 1.30* 04/25/2012 0410      Component Value Date/Time   CALCIUM 10.2 07/15/2013 1021   CALCIUM 9.0 04/25/2012 0404   ALKPHOS 64 07/15/2013 1021   ALKPHOS 66 04/25/2012 0404   AST 56* 07/15/2013 1021   AST 46* 04/25/2012 0404   ALT 51 07/15/2013 1021   ALT 30 04/25/2012 0404   BILITOT 1.04 07/15/2013 1021   BILITOT 0.2*  04/25/2012 0404       STUDIES:   Most recent bone density was in April 2014 and was normal.  Most recent bilateral mammogram was 07/06/2012, and was unremarkable.   ASSESSMENT: 63 y.o.   woman with:  1. Breast cancer, status post right lumpectomy and sentinel lymph node biopsy February 2011 for a T1c N0 grade 1 invasive ductal carcinoma, estrogen receptor positive, progesterone receptor and HER2 negative, with an MIB-1 of 3%, status post radiation therapy completed May 2011, at which time she began anastrozole.  She continues on anastrozole with good tolerance and the plan is to continue that for 5 years (until May 2016).  2. History of an ovarian cyst on the right, the patient declining surgery and currently asymptomatic.  3. Persistently elevated CA27.29, stable, with no evidence of metastatic disease--no longer being followed, as per NCCN guidelins.   4. History of hepatic steatosis.   5. History of hypokalemia, now  normalized  6. Gastrointestinal complaints, followed by Dr. Michail Sermon 7. Arthritis, particularly involving the back and the right knee, followed by Dr. Lynann Bologna.     PLAN: Overall, I believe [place] is doing very well, and she will continue on the anastrozole which I have refilled for another year. She is slightly past due for her bilateral mammogram and we will get that scheduled as soon as possible. I do feel the nodules she has brought to my attention and the right axilla. Is difficult to tell whether these could be enlarged lymph nodes, areas of scar tissue, or perhaps enlarged slightly and. We are going to ask for an ultrasound of the right axilla for further evaluation when she goes for her mammogram in the next couple of weeks.  Otherwise, she'll be due again for her next annaul mammogram as well as a bone density in early May of next year, after which she will see Dr. Jana Hakim one last time. At that point, she will have completed all 5 years of her anastrozole and  we will likely allow her to "graduate" from followup.  All of the above was reviewed in detail with Jaelah today, and she voices her understanding and agreement with our plan.  She will contact us with any changes, questions, or problems prior to her appointment in May 2016.  Amy Milda Smart PA-C    07/22/2013

## 2013-07-23 ENCOUNTER — Encounter: Payer: Self-pay | Admitting: Family Medicine

## 2013-07-23 ENCOUNTER — Ambulatory Visit (INDEPENDENT_AMBULATORY_CARE_PROVIDER_SITE_OTHER): Payer: Commercial Managed Care - HMO | Admitting: Family Medicine

## 2013-07-23 VITALS — BP 156/93 | HR 80 | Ht 60.0 in | Wt 174.0 lb

## 2013-07-23 DIAGNOSIS — K219 Gastro-esophageal reflux disease without esophagitis: Secondary | ICD-10-CM | POA: Insufficient documentation

## 2013-07-23 DIAGNOSIS — R740 Nonspecific elevation of levels of transaminase and lactic acid dehydrogenase [LDH]: Secondary | ICD-10-CM

## 2013-07-23 DIAGNOSIS — F1010 Alcohol abuse, uncomplicated: Secondary | ICD-10-CM

## 2013-07-23 DIAGNOSIS — R7401 Elevation of levels of liver transaminase levels: Secondary | ICD-10-CM

## 2013-07-23 DIAGNOSIS — I10 Essential (primary) hypertension: Secondary | ICD-10-CM

## 2013-07-23 LAB — CBC
HEMATOCRIT: 37.7 % (ref 36.0–46.0)
Hemoglobin: 13.4 g/dL (ref 12.0–15.0)
MCH: 32.1 pg (ref 26.0–34.0)
MCHC: 35.5 g/dL (ref 30.0–36.0)
MCV: 90.2 fL (ref 78.0–100.0)
Platelets: 380 10*3/uL (ref 150–400)
RBC: 4.18 MIL/uL (ref 3.87–5.11)
RDW: 14.2 % (ref 11.5–15.5)
WBC: 5 10*3/uL (ref 4.0–10.5)

## 2013-07-23 MED ORDER — PANTOPRAZOLE SODIUM 40 MG PO TBEC: 40.0000 mg | DELAYED_RELEASE_TABLET | Freq: Every day | ORAL | Status: DC

## 2013-07-23 MED ORDER — AMLODIPINE BESYLATE 2.5 MG PO TABS: 5.0000 mg | ORAL_TABLET | Freq: Every day | ORAL | Status: DC

## 2013-07-23 MED ORDER — TRAMADOL HCL 50 MG PO TABS: 50.0000 mg | ORAL_TABLET | Freq: Three times a day (TID) | ORAL | Status: DC | PRN

## 2013-07-23 NOTE — Assessment & Plan Note (Signed)
Repeat BP today 148/80.  Increase Norvasc to 5 mg qd, f/u in 1 month.

## 2013-07-23 NOTE — Patient Instructions (Signed)
Carrie [name], it was nice meeting you today.  Please make an appointment for one month and we will discuss your osteoarthritis.   Thanks, Dr. Awanda Mink

## 2013-07-23 NOTE — Progress Notes (Signed)
Carrie [name] is a 63 y.o. who presents today for establishing care.  She has hx of alcohol abuse, HTN, Breast CA w/ mastectomy in 2011 followed by Heme/onc.   Htn - On norvasc 2.5 mg qd, compliant, denies edema.  Elevated today on both measurements.  Breast CA - Previous lumpectomy, followed by heme/onc, in remission, on Anastrozole until May 2016.    Alcohol Abuse - Drinking > 6 drinks per day, almost every day.  Drinks first thing in the AM, does not feel need to cut down, annoyance from other people, or guilty.  Denies affecting her social life and denies previous hx of DUI or divorce or firing secondary to alcohol abuse.   Past Medical History  Diagnosis Date  . Breast cancer, left breast 07/17/2011  . Hypokalemia 07/17/2011  . Hepatic steatosis 07/17/2011    History   Social History  . Marital Status: Single    Spouse Name: N/A    Number of Children: N/A  . Years of Education: N/A   Occupational History  . Not on file.   Social History Main Topics  . Smoking status: Never Smoker   . Smokeless tobacco: Never Used  . Alcohol Use: 18.0 oz/week    30 Cans of beer per week  . Drug Use: No  . Sexual Activity: Yes    Birth Control/ Protection: Post-menopausal   Other Topics Concern  . Not on file   Social History Narrative  . No narrative on file    Family History  Problem Relation Age of Onset  . Cancer Other   . Diabetes Mother   . Diabetes Brother   . Hypertension Mother   . Hypertension Father     Current Outpatient Prescriptions on File Prior to Visit  Medication Sig Dispense Refill  . amLODipine (NORVASC) 2.5 MG tablet Take 2.5 mg by mouth daily.      Marland Kitchen anastrozole (ARIMIDEX) 1 MG tablet Take 1 tablet (1 mg total) by mouth daily.  90 tablet  3  . albuterol (PROVENTIL HFA;VENTOLIN HFA) 108 (90 BASE) MCG/ACT inhaler Inhale 1-2 puffs into the lungs every 6 (six) hours as needed. For wheezing      . cyclobenzaprine (FLEXERIL) 10 MG tablet Take 1 tablet (10 mg  total) by mouth 2 (two) times daily as needed for muscle spasms.  20 tablet  0   No current facility-administered medications on file prior to visit.    Patient Information Form: Screening and ROS  AUDIT-C Score: 11 Do you feel safe in relationships? no PHQ-2:negative  Review of Symptoms  General:  Negative for nexplained weight loss, fever Skin: Negative for new or changing mole, sore that won't heal HEENT: Negative for trouble hearing, trouble seeing, ringing in ears, mouth sores, hoarseness, change in voice, dysphagia. CV:  Negative for chest pain, dyspnea, edema, palpitations Resp: Negative for cough, dyspnea, hemoptysis GI: Negative for nausea, vomiting, diarrhea, constipation, abdominal pain, melena, hematochezia. GU: Negative for dysuria, incontinence, urinary hesitance, hematuria, vaginal or penile discharge, polyuria, sexual difficulty, lumps in testicle or breasts MSK: Negative for muscle cramps or aches, + joint pain and swelling Neuro: + for headaches, Negative weakness, numbness, dizziness, passing out/fainting Psych: Negative for depression, anxiety, memory problems  Physical Exam Filed Vitals:   07/23/13 1020  BP: 156/93  Pulse: 80    Gen: NAD, Well nourished, Well developed HEENT: PERLA, EOMI, [place]/AT, no sclera icterus  Neck: no JVD Cardio: RRR, No murmurs/gallops/rubs Lungs: CTA, no wheezes, rhonchi, crackles Abd: NABS,  soft nontender nondistended, no HSM MSK: ROM normal  Neuro: CN 2-12 intact, MS 5/5 B/L UE and LE, +2 patellar and achilles relfex b/l  Psych: AAO x 3 Skin: No spider angioma, rashes, palmar erythema      Chemistry      Component Value Date/Time   NA 136 07/15/2013 1021   NA 141 04/25/2012 0410   K 3.5 07/15/2013 1021   K 3.7 04/25/2012 0410   CL 98 07/14/2012 1400   CL 106 04/25/2012 0410   CO2 25 07/15/2013 1021   CO2 22 04/25/2012 0404   BUN 11.0 07/15/2013 1021   BUN 4* 04/25/2012 0410   CREATININE 1.1 07/15/2013 1021   CREATININE 1.30*  04/25/2012 0410      Component Value Date/Time   CALCIUM 10.2 07/15/2013 1021   CALCIUM 9.0 04/25/2012 0404   ALKPHOS 64 07/15/2013 1021   ALKPHOS 66 04/25/2012 0404   AST 56* 07/15/2013 1021   AST 46* 04/25/2012 0404   ALT 51 07/15/2013 1021   ALT 30 04/25/2012 0404   BILITOT 1.04 07/15/2013 1021   BILITOT 0.2* 04/25/2012 0404      Lab Results  Component Value Date   WBC 5.0 07/15/2013   HGB 13.5 07/15/2013   HCT 39.1 07/15/2013   MCV 94.6 07/15/2013   PLT 267 07/15/2013

## 2013-07-23 NOTE — Assessment & Plan Note (Signed)
Secondary to alcohol abuse, most likely alcoholic steatitis.  Would need [place] to dx this but will get PT-INR, hepatits panel, cmet, CBC.

## 2013-07-23 NOTE — Assessment & Plan Note (Signed)
+   CAGE with eye opener.  6+ drinks per day for > 10 yrs.  Will f/u with CMET, CBC, PT-INR, and Hepatitis panel.  No evidence of end stage liver disease on exam today including HSM, ascites, caput medusa, palmar erythema, spider angioma.

## 2013-07-24 LAB — COMPREHENSIVE METABOLIC PANEL
ALT: 42 U/L — AB (ref 0–35)
AST: 39 U/L — ABNORMAL HIGH (ref 0–37)
Albumin: 4.6 g/dL (ref 3.5–5.2)
Alkaline Phosphatase: 58 U/L (ref 39–117)
BUN: 6 mg/dL (ref 6–23)
CO2: 29 meq/L (ref 19–32)
Calcium: 10.2 mg/dL (ref 8.4–10.5)
Chloride: 96 mEq/L (ref 96–112)
Creat: 0.72 mg/dL (ref 0.50–1.10)
Glucose, Bld: 103 mg/dL — ABNORMAL HIGH (ref 70–99)
POTASSIUM: 3.8 meq/L (ref 3.5–5.3)
SODIUM: 135 meq/L (ref 135–145)
TOTAL PROTEIN: 7.6 g/dL (ref 6.0–8.3)
Total Bilirubin: 0.8 mg/dL (ref 0.2–1.2)

## 2013-07-24 LAB — PROTIME-INR
INR: 0.98 (ref <1.50)
Prothrombin Time: 12.9 seconds (ref 11.6–15.2)

## 2013-07-24 LAB — HEPATITIS PANEL, ACUTE
HCV Ab: NEGATIVE
Hep A IgM: NONREACTIVE
Hep B C IgM: NONREACTIVE
Hepatitis B Surface Ag: NEGATIVE

## 2013-07-29 ENCOUNTER — Inpatient Hospital Stay: Admission: RE | Admit: 2013-07-29 | Payer: Commercial Managed Care - HMO | Source: Ambulatory Visit

## 2013-07-30 ENCOUNTER — Telehealth: Payer: Self-pay | Admitting: *Deleted

## 2013-07-30 NOTE — Telephone Encounter (Signed)
Called pt to inquire about missed appt on 07/29/13 for her mammogram and ultrasound. No answer. I left a message for pt to reschedule that appt as soon as possible and if she has any problems doing so to give this nurse a call @ [phone]. Message to be forwarded to [employer], PA-C.

## 2013-08-04 ENCOUNTER — Encounter: Payer: Self-pay | Admitting: Family Medicine

## 2013-08-04 ENCOUNTER — Ambulatory Visit (INDEPENDENT_AMBULATORY_CARE_PROVIDER_SITE_OTHER): Payer: Commercial Managed Care - HMO | Admitting: Family Medicine

## 2013-08-04 VITALS — BP 135/84 | HR 89 | Temp 97.7°F | Ht 60.0 in | Wt 175.0 lb

## 2013-08-04 DIAGNOSIS — M25562 Pain in left knee: Secondary | ICD-10-CM

## 2013-08-04 DIAGNOSIS — M25569 Pain in unspecified knee: Secondary | ICD-10-CM

## 2013-08-04 DIAGNOSIS — M25561 Pain in right knee: Secondary | ICD-10-CM | POA: Insufficient documentation

## 2013-08-04 MED ORDER — METHYLPREDNISOLONE ACETATE 40 MG/ML IJ SUSP
40.0000 mg | Freq: Once | INTRAMUSCULAR | Status: AC
  Administered 2013-08-04: 40 mg via INTRA_ARTICULAR

## 2013-08-04 NOTE — Patient Instructions (Signed)
Knee Injection Joint injections are shots. Your caregiver will place a needle into your knee joint. The needle is used to put medicine into the joint. These shots can be used to help treat different painful knee conditions such as osteoarthritis, bursitis, local flare-ups of rheumatoid arthritis, and pseudogout. Anti-inflammatory medicines such as corticosteroids and anesthetics are the most common medicines used for joint and soft tissue injections.  PROCEDURE  The skin over the kneecap will be cleaned with an antiseptic solution.  Your caregiver will inject a small amount of a local anesthetic (a medicine like Novocaine) just under the skin in the area that was cleaned.  After the area becomes numb, a second injection is done. This second injection usually includes an anesthetic and an anti-inflammatory medicine called a steroid or cortisone. The needle is carefully placed in between the kneecap and the knee, and the medicine is injected into the joint space.  After the injection is done, the needle is removed. Your caregiver may place a bandage over the injection site. The whole procedure takes no more than a couple of minutes. BEFORE THE PROCEDURE  Wash all of the skin around the entire knee area. Try to remove any loose, scaling skin. There is no other specific preparation necessary unless advised otherwise by your caregiver. LET YOUR CAREGIVER KNOW ABOUT:   Allergies.  Medications taken including herbs, eye drops, over the counter medications, and creams.  Use of steroids (by mouth or creams).  Possible pregnancy, if applicable.  Previous problems with anesthetics or Novocaine.  History of blood clots (thrombophlebitis).  History of bleeding or blood problems.  Previous surgery.  Other health problems. RISKS AND COMPLICATIONS Side effects from cortisone shots are rare. They include:   Slight bruising of the skin.  Shrinkage of the normal fatty tissue under the skin where  the shot was given.  Increase in pain after the shot.  Infection.  Weakening of tendons or tendon rupture.  Allergic reaction to the medicine.  Diabetics may have a temporary increase in their blood sugar after a shot.  Cortisone can temporarily weaken the immune system. While receiving these shots, you should not get certain vaccines. Also, avoid contact with anyone who has chickenpox or measles. Especially if you have never had these diseases or have not been previously immunized. Your immune system may not be strong enough to fight off the infection while the cortisone is in your system. AFTER THE PROCEDURE   You can go home after the procedure.  You may need to put ice on the joint 15-20 minutes every 3 or 4 hours until the pain goes away.  You may need to put an elastic bandage on the joint. HOME CARE INSTRUCTIONS   Only take over-the-counter or prescription medicines for pain, discomfort, or fever as directed by your caregiver.  You should avoid stressing the joint. Unless advised otherwise, avoid activities that put a lot of pressure on a knee joint, such as:  Jogging.  Bicycling.  Recreational climbing.  Hiking.  Laying down and elevating the leg/knee above the level of your heart can help to minimize swelling. SEEK MEDICAL CARE IF:   You have repeated or worsening swelling.  There is drainage from the puncture area.  You develop red streaking that extends above or below the site where the needle was inserted. SEEK IMMEDIATE MEDICAL CARE IF:   You develop a fever.  You have pain that gets worse even though you are taking pain medicine.  The area is   red and warm, and you have trouble moving the joint. MAKE SURE YOU:   Understand these instructions.  Will watch your condition.  Will get help right away if you are not doing well or get worse. Document Released: 06/09/2006 Document Revised: 06/10/2011 Document Reviewed: 03/06/2007 [employer] Patient  Information 2014 [employer].  

## 2013-08-04 NOTE — Progress Notes (Signed)
Carrie [name] is a 63 y.o. female who presents today for R knee pain.    R Knee OA - Pt last x-rays performed in 2011, shows medial joint space narrowing.  Has been on tramadol, indomethacin, and flexeril with minimal relief.  Has had injections in the past, but not one in the past yr.    Past Medical History  Diagnosis Date  . Breast cancer, left breast 07/17/2011  . Hypokalemia 07/17/2011  . Hepatic steatosis 07/17/2011    History  Smoking status  . Never Smoker   Smokeless tobacco  . Never Used    Family History  Problem Relation Age of Onset  . Cancer Other   . Diabetes Mother   . Diabetes Brother   . Hypertension Mother   . Hypertension Father     Current Outpatient Prescriptions on File Prior to Visit  Medication Sig Dispense Refill  . albuterol (PROVENTIL HFA;VENTOLIN HFA) 108 (90 BASE) MCG/ACT inhaler Inhale 1-2 puffs into the lungs every 6 (six) hours as needed. For wheezing      . amLODipine (NORVASC) 2.5 MG tablet Take 2 tablets (5 mg total) by mouth daily.  90 tablet  1  . anastrozole (ARIMIDEX) 1 MG tablet Take 1 tablet (1 mg total) by mouth daily.  90 tablet  3  . cyclobenzaprine (FLEXERIL) 10 MG tablet Take 1 tablet (10 mg total) by mouth 2 (two) times daily as needed for muscle spasms.  20 tablet  0  . indomethacin (INDOCIN) 50 MG capsule Take 50 mg by mouth 3 (three) times daily with meals.      . pantoprazole (PROTONIX) 40 MG tablet Take 1 tablet (40 mg total) by mouth daily.  30 tablet  3  . traMADol (ULTRAM) 50 MG tablet Take 1 tablet (50 mg total) by mouth every 8 (eight) hours as needed.  90 tablet  1   No current facility-administered medications on file prior to visit.    ROS: Per HPI.  All other systems reviewed and are negative.   Physical Exam Filed Vitals:   08/04/13 0929  BP: 135/84  Pulse: 89  Temp: 97.7 F (36.5 C)    Physical Examination:  Knee: Normal to inspection with no erythema or effusion or obvious bony abnormalities. TTP medial  joint line on R ROM normal in flexion and extension and lower leg rotation. Ligaments with solid consistent endpoints including ACL, PCL, LCL, MCL. Negative Mcmurray's and provocative meniscal tests. Non painful patellar compression. Patellar and quadriceps tendons unremarkable. Hamstring and quadriceps strength is normal.    R Knee x-rays from 2011 reviewed - mild medial joint space narrowing with minimal osteophytes and sclerosis noted.  No subchondral cysts noted

## 2013-08-04 NOTE — Assessment & Plan Note (Signed)
Informed consent obtained and placed in chart.  Time out performed.  Area cleaned with iodine x 3 and wiped clear with alcohol swab.  Using 21 1/2 gauge needle 1 cc depo 40 and 3 cc's 1% Lidocaine were injected in superiolateral R knee.  Sterile bandage placed.  Patient tolerated procedure well.  No complications.    Injection today, continue with tramadol and NSAID.  No Tylenol 2/2 alcohol abuse.  Knee x-rays today.  F/U in 6 weeks.

## 2013-08-20 ENCOUNTER — Ambulatory Visit (INDEPENDENT_AMBULATORY_CARE_PROVIDER_SITE_OTHER): Payer: Commercial Managed Care - HMO | Admitting: Family Medicine

## 2013-08-20 ENCOUNTER — Ambulatory Visit (HOSPITAL_COMMUNITY)
Admission: RE | Admit: 2013-08-20 | Discharge: 2013-08-20 | Disposition: A | Discharge status: Home / Self Care | Payer: Medicare HMO | Source: Ambulatory Visit | Attending: Family Medicine | Admitting: Family Medicine

## 2013-08-20 ENCOUNTER — Other Ambulatory Visit: Payer: Self-pay | Admitting: Family Medicine

## 2013-08-20 ENCOUNTER — Ambulatory Visit (HOSPITAL_COMMUNITY)
Admission: RE | Admit: 2013-08-20 | Discharge: 2013-08-20 | Disposition: A | Discharge status: Home / Self Care | Payer: Medicare Other | Source: Ambulatory Visit | Attending: Family Medicine | Admitting: Family Medicine

## 2013-08-20 ENCOUNTER — Encounter: Payer: Self-pay | Admitting: Family Medicine

## 2013-08-20 VITALS — BP 178/92 | HR 77 | Temp 97.8°F | Wt 170.4 lb

## 2013-08-20 DIAGNOSIS — M25562 Pain in left knee: Secondary | ICD-10-CM

## 2013-08-20 DIAGNOSIS — IMO0002 Reserved for concepts with insufficient information to code with codable children: Secondary | ICD-10-CM

## 2013-08-20 DIAGNOSIS — M1710 Unilateral primary osteoarthritis, unspecified knee: Secondary | ICD-10-CM | POA: Insufficient documentation

## 2013-08-20 DIAGNOSIS — M7050 Other bursitis of knee, unspecified knee: Secondary | ICD-10-CM | POA: Insufficient documentation

## 2013-08-20 DIAGNOSIS — M25561 Pain in right knee: Secondary | ICD-10-CM

## 2013-08-20 DIAGNOSIS — I10 Essential (primary) hypertension: Secondary | ICD-10-CM

## 2013-08-20 MED ORDER — INDOMETHACIN 50 MG PO CAPS: 50.0000 mg | ORAL_CAPSULE | Freq: Three times a day (TID) | ORAL | Status: DC

## 2013-08-20 NOTE — Assessment & Plan Note (Signed)
R-Knee infero-medial tibial soft tissue swelling, consistent with pes anserine bursitis. Likely secondary to hx OA. Not in area of recent injection (R-lateral) - No evidence of R-knee effusion. No signs of infection  Plan: 1. Recommend completing previously ordered bilateral knee X-rays today 2. Refill Indomethicin 50mg  TID wc (#90, 0 refill) 3. Advised conservative management, knee sleeve/brace, provided hand-out for exercises 4. RTC at previously scheduled f/u with Dr. Awanda Mink 6/19

## 2013-08-20 NOTE — Patient Instructions (Signed)
It was good seeing you in clinic today - Make sure that you get your Knee Xrays today, your doctor will notify you with the results if there are any concerns, otherwise you will discuss this in your next follow-up - Please follow the exercises listed on the handout - I anticipate that this swelling will improve with time - Provided refill for anti-inflammatory medicine - Recommend Knee brace, elevation in evening - Remember to take your Blood Pressure medications  Please keep your follow-up apt with Dr. Awanda Mink on 09/17/13 at 8:30am   Pes Anserinus Syndrome with Rehab The pes anserine, also known as the goose's foot, is an area of the shinbone (tibia) near the knee joint where the tendons of three of the muscles of the thigh insert into the bone. These muscles are important for bending the knee and bringing the leg across the body. Just underneath the three tendons that attach at the pes anserinus exists a fluid filled sac (bursa) that is meant to reduce the friction between the tendons and the tibia. Pes anserinus syndrome is a condition that is characterized by inflammation of the bursa (bursitis) and/ or tendonitis (inflammation of the tendon) and may cause severe pain in the lower portion of the inner (medial) side of the knee. SYMPTOMS   Pain and inflammation over the lower portion of the medial side of the knee.  Pain that worsens as the duration of an activity increases.  Pain that worsens when bending the knee, especially against resistance.  A crackling sound (crepitation) when the tendon or bursa is moved or touched. CAUSES  Bursitis and tendonitis are usually characterized as overuse injuries. Common mechanisms of injury include:  Stress placed on the knee from a sudden increase in the intensity, frequency, or duration of training.  Direct trauma to the upper leg (less common). RISK INCREASES WITH:  Endurance sports (distance running or triathletes).  Making changes to or  beginning a new training program.  Sports that place stress on the muscles that insert at the pes anserinus, such as those that require pivoting, cutting, or jumping.  Improper training.  Poor strength and flexibility  Failure to warm-up properly before activity.  Improper knee alignment ( knock knees).  Arthritis of the knee. PREVENTION  Warm up and stretch properly before activity.  Allow for adequate recovery between workouts.  Maintain physical fitness:  Strength, flexibility, and endurance.  Cardiovascular fitness.  Learn and use training methods that will reduce the stress placed on the pes anserinus.  Arch supports (orthotics) may be helpful for those with flat feet. PROGNOSIS  If treated properly, then the symptoms of pes anserinus syndrome usually resolve within 6 weeks.  RELATED COMPLICATIONS   Persistent and potentially chronic pain if the condition is not treated properly.  Re-injury if activity is resumed before the injury is allowed to heal completely, or if one resumes improper training habits. TREATMENT Treatment initially involves the use of ice and medication to help reduce pain and inflammation. The use of strengthening and stretching exercises may help reduce pain with activity. These exercises may be performed at home or with a therapist. Individuals who have flat feet may find benefit in wearing arch supports in their shoes. Some individuals find that compression bandages or knee sleeves help reduce symptoms. Your caregiver may recommend a corticosteroid injection to help reduce inflammation. If symptoms persist, despite conservative treatment for greater than 6 months, then surgery may be recommended.  MEDICATION   If pain medication is necessary,  then nonsteroidal anti-inflammatory medications, such as aspirin and ibuprofen, or other minor pain relievers, such as acetaminophen, are often recommended.  Do not take pain medication for 7 days before  surgery.  Prescription pain relievers may be given if deemed necessary by your caregiver. Use only as directed and only as much as you need.  Corticosteroid injections may be given by your caregiver. These injections should be reserved for the most serious cases, because they may only be given a certain number of times. SEEK MEDICAL CARE IF:  Treatment seems to offer no benefit, or the condition worsens.  Any medications produce adverse side effects. Document Released: 03/18/2005 Document Revised: 06/10/2011 Document Reviewed: 06/30/2008 [facility] Patient Information 2014 [place], [place].

## 2013-08-20 NOTE — Progress Notes (Signed)
Subjective:     Patient ID: Carrie [name], female   DOB: 10/19/1950, 63 y.o.   MRN: [id number]  Patient presents for a same day visit.  HPI  BELOW R-KNEE SWELLING: - History of OA bilateral knees, last X-ray R-knee 12/2009 with mild medial joint space narrowing, minimal osteophytes / sclerosis - Last seen in clinic on 08/04/13, received steroid injection R-knee (lateral jt space). Ordered bilateral Knee X-rays (not completed), patient reports that she plans to get her X-rays today - Reports that when she woke up this morning found raised area under right medial knee, called it her "second knee". Reports prior hx of fall injury 20 years ago in similar area and skin with overlying scar tissue, but denies history of ever having similar swelling. - States that does not interfere with her knee joint function or walking - Admits to mild tenderness to palpation Denies warmth, redness, tenderness - Additionally, regarding recent R-knee injection, she says her pain improved x 3 days after recent injection, then pain returned. Currently experiencing intermittent painful episodes - Currently treated with Tramadol (stopped taking), Indomethacin (out), Flexeril (makes her too drowsy)  HTN: - Reported did not take BP meds today - No complaints - Denies CP, SOB, HA, vision changes  I have reviewed and updated the following as appropriate: allergies and current medications  Social Hx: Never smoker, current EtOH use   Review of Systems  See above HPI     Objective:   Physical Exam  BP 178/92  Pulse 77  Temp(Src) 97.8 F (36.6 C) (Oral)  Wt 170 lb 7 oz (77.31 kg)  Gen - well-appearing, cooperative, NAD HEENT - patent nares w/ mild congestion, oropharynx clear, MMM Heart - RRR MSK - Right Knee: infero-medial soft tissue swelling, mild TTP, no erythema, consistent with pes anserine bursitis. Noted overlying scar tissue (long since healed), R-Knee joint with +creptius, no effusion, no erythema, no  joint line tenderness, ligaments intact (ACL, PCL, LCL, MCL). L-Knee: unremarkable, no effusion, no erythema, normal ROM / intact ligaments Ext - non-tender, no edema, peripheral pulses intact +2 b/l Skin - warm, dry, no rashes Neuro - awake, alert, oriented, grossly non-focal, intact muscle strength 5/5 b/l, intact distal sensation to light touch, gait normal     Assessment:     See specific A&P problem list for details.      Plan:     See specific A&P problem list for details.

## 2013-08-20 NOTE — Assessment & Plan Note (Signed)
Elevated BP 178/92, repeat BP improved 164/80 - Patient did not take BP meds today  Plan: 1. Continue recently increased Norvasc 5mg  daily 2. Re-check at next f/u apt

## 2013-09-03 ENCOUNTER — Ambulatory Visit
Admission: RE | Admit: 2013-09-03 | Discharge: 2013-09-03 | Disposition: A | Discharge status: Home / Self Care | Payer: Commercial Managed Care - HMO | Source: Ambulatory Visit | Attending: Physician Assistant | Admitting: Physician Assistant

## 2013-09-03 ENCOUNTER — Other Ambulatory Visit: Payer: Self-pay | Admitting: Physician Assistant

## 2013-09-03 DIAGNOSIS — Z853 Personal history of malignant neoplasm of breast: Secondary | ICD-10-CM

## 2013-09-03 DIAGNOSIS — R2231 Localized swelling, mass and lump, right upper limb: Secondary | ICD-10-CM

## 2013-09-07 ENCOUNTER — Telehealth: Payer: Self-pay | Admitting: Family Medicine

## 2013-09-07 NOTE — Telephone Encounter (Signed)
Patient states she has a cough and now is coughing up green phlegm. Requesting that Dr. Awanda Mink to send a antibiotic.  Please advise.

## 2013-09-07 NOTE — Telephone Encounter (Signed)
States that her cough has been going on since Saturday and would like an antibiotic for this. She is having some chest pain due to coughing. I am trying to get her to come int to be seen but she is declining and wants something just sent in. I advised her that I will send this to her PCP but cannot make andy promises on what the outcome will be.

## 2013-09-07 NOTE — Telephone Encounter (Signed)
Appointment tomorrow with Dr. Erin Hearing @ 9:15am

## 2013-09-07 NOTE — Telephone Encounter (Signed)
Pt will need appointment, at this point does not seem like she needs ABx but cannot say for sure without further evaluation.  Thanks [employer]. Awanda Mink, DO of [facility] 09/07/2013, 11:32 AM

## 2013-09-08 ENCOUNTER — Ambulatory Visit (INDEPENDENT_AMBULATORY_CARE_PROVIDER_SITE_OTHER): Payer: Commercial Managed Care - HMO | Admitting: Family Medicine

## 2013-09-08 ENCOUNTER — Encounter: Payer: Self-pay | Admitting: Family Medicine

## 2013-09-08 VITALS — BP 149/91 | HR 87 | Temp 98.5°F | Wt 169.0 lb

## 2013-09-08 DIAGNOSIS — J209 Acute bronchitis, unspecified: Secondary | ICD-10-CM

## 2013-09-08 MED ORDER — ALBUTEROL SULFATE HFA 108 (90 BASE) MCG/ACT IN AERS: 2.0000 | INHALATION_SPRAY | Freq: Four times a day (QID) | RESPIRATORY_TRACT | Status: DC | PRN

## 2013-09-08 NOTE — Patient Instructions (Signed)
Good to see you today!  Thanks for coming in.  Use the albuterol inhaler at night and every 6 hours as needed  Use the robitussin generic over the counter  If you get high fever or shortness of breath or chest pain come back  Should be better 2 weeks and back to normal in 6-8 weeks

## 2013-09-08 NOTE — Progress Notes (Signed)
   Subjective:    Patient ID: Carrie [name], female    DOB: 1951/03/01, 63 y.o.   MRN: [id number]  HPI  COUGH  Has been coughing for 5 days. Cough is: interimittent and hacking Sputum production: white thick Medications tried: used to use albuterol with past episodes does not have now Taking blood pressure medications: no acei  Symptoms Runny nose: no Mucous in back of throat: yes Throat burning or reflux: no Wheezing or asthma: mild occsl Fever: no Chest Pain: no Shortness of breath: no Leg swelling: no Hemoptysis: no Weight loss: no  Patient believes may be caused by bronchitis  ROS see HPI Smoking Status noted   Review of Systems     Objective:   Physical Exam  Alert no acute distress Lungs:  Normal respiratory effort, chest expands symmetrically. Lungs are clear to auscultation, no crackles  Very mild wheeze on expiration Heart - Regular rate and rhythm.  No murmurs, gallops or rubs.   Extremities:  No cyanosis, edema, or deformity noted with good range of motion of all major joints.   Nose -clear       Assessment & Plan:

## 2013-09-08 NOTE — Assessment & Plan Note (Signed)
Likely viral.  No signs of pneumonia or COPD.  Treat with bronchodilator which has helped in past.

## 2013-09-17 ENCOUNTER — Ambulatory Visit (INDEPENDENT_AMBULATORY_CARE_PROVIDER_SITE_OTHER): Payer: Commercial Managed Care - HMO | Admitting: Family Medicine

## 2013-09-17 ENCOUNTER — Encounter: Payer: Self-pay | Admitting: Family Medicine

## 2013-09-17 VITALS — BP 152/86 | HR 86 | Temp 99.0°F | Ht 60.0 in | Wt 169.9 lb

## 2013-09-17 DIAGNOSIS — K2990 Gastroduodenitis, unspecified, without bleeding: Secondary | ICD-10-CM

## 2013-09-17 DIAGNOSIS — I10 Essential (primary) hypertension: Secondary | ICD-10-CM

## 2013-09-17 DIAGNOSIS — M25561 Pain in right knee: Secondary | ICD-10-CM

## 2013-09-17 DIAGNOSIS — F1010 Alcohol abuse, uncomplicated: Secondary | ICD-10-CM

## 2013-09-17 DIAGNOSIS — M25569 Pain in unspecified knee: Secondary | ICD-10-CM

## 2013-09-17 DIAGNOSIS — K2970 Gastritis, unspecified, without bleeding: Secondary | ICD-10-CM | POA: Insufficient documentation

## 2013-09-17 MED ORDER — DULOXETINE HCL 30 MG PO CPEP: 30.0000 mg | ORAL_CAPSULE | Freq: Every day | ORAL | Status: DC

## 2013-09-17 NOTE — Progress Notes (Signed)
Carrie [name] is a 63 y.o. female who presents today for R knee along with lower abdominal pain.  R Knee - Previous dx of R OA, tx with tramadol and indomethocin, injected 5/6 with some relief.  Continues to have R knee pain, 5/10, located at the joint line/MCL.  Worse with walking.  X-rays performed on 08/04/2013 showing medial joint line narrowing, osteosclerosis, and osteophyte formation.    Lower abdominal pain - Has been ongoing for about 1-2 now.  Located suprapubic-generalized area, denies any dysuria, hematuria, increased frequency, urgency.  Denies any diarrhea, constipation, nausea, or vomiting, fever, chills, sweats.  Previous Colonoscopy in 2013 showing a few polyps which were removed.  Denies any hematochezia or melena.  Does endorse occasional vomiting, sometimes after meals and does have trouble with eating.  She has lost 15-20 lbs over the past two yrs, unintentional.  Denies any dysphagia or odynophagia, fever, nightsweats, chills.  No LAD as well.  Never has had EGD performed.   Past Medical History  Diagnosis Date  . Breast cancer, left breast 07/17/2011  . Hypokalemia 07/17/2011  . Hepatic steatosis 07/17/2011    History  Smoking status  . Never Smoker   Smokeless tobacco  . Never Used    Family History  Problem Relation Age of Onset  . Cancer Other   . Diabetes Mother   . Diabetes Brother   . Hypertension Mother   . Hypertension Father     Current Outpatient Prescriptions on File Prior to Visit  Medication Sig Dispense Refill  . albuterol (VENTOLIN HFA) 108 (90 BASE) MCG/ACT inhaler Inhale 2 puffs into the lungs every 6 (six) hours as needed for wheezing or shortness of breath.  1 Inhaler  1  . amLODipine (NORVASC) 2.5 MG tablet Take 2 tablets (5 mg total) by mouth daily.  90 tablet  1  . anastrozole (ARIMIDEX) 1 MG tablet Take 1 tablet (1 mg total) by mouth daily.  90 tablet  3  . cyclobenzaprine (FLEXERIL) 10 MG tablet Take 1 tablet (10 mg total) by mouth 2 (two)  times daily as needed for muscle spasms.  20 tablet  0  . pantoprazole (PROTONIX) 40 MG tablet Take 1 tablet (40 mg total) by mouth daily.  30 tablet  3   No current facility-administered medications on file prior to visit.    ROS: Per HPI.  All other systems reviewed and are negative.   Physical Exam Filed Vitals:   09/17/13 0836  BP: 152/86  Pulse: 86  Temp: 99 F (37.2 C)    Physical Examination: General appearance - alert, well appearing, and in no distress Chest - clear to auscultation, no wheezes, rales or rhonchi, symmetric air entry Heart - normal rate, regular rhythm, normal S1, S2, no murmurs, rubs, clicks or gallops Abdomen - soft, nontender, nondistended, no masses or organomegaly Knee: Knee: Normal to inspection with no erythema or effusion or obvious bony abnormalities. TTP B/L medial joint line, no pes anserinus tenderness  ROM normal in flexion and extension and lower leg rotation. Ligaments with solid consistent endpoints including ACL, PCL, LCL, MCL. Negative Mcmurray's and provocative meniscal tests. Non painful patellar compression. Patellar and quadriceps tendons unremarkable. Hamstring and quadriceps strength is normal.  Weakness of the abductors B/L, R > L.      Chemistry      Component Value Date/Time   NA 135 07/23/2013 1115   NA 136 07/15/2013 1021   K 3.8 07/23/2013 1115   K 3.5 07/15/2013  1021   CL 96 07/23/2013 1115   CL 98 07/14/2012 1400   CO2 29 07/23/2013 1115   CO2 25 07/15/2013 1021   BUN 6 07/23/2013 1115   BUN 11.0 07/15/2013 1021   CREATININE 0.72 07/23/2013 1115   CREATININE 1.1 07/15/2013 1021   CREATININE 1.30* 04/25/2012 0410      Component Value Date/Time   CALCIUM 10.2 07/23/2013 1115   CALCIUM 10.2 07/15/2013 1021   ALKPHOS 58 07/23/2013 1115   ALKPHOS 64 07/15/2013 1021   AST 39* 07/23/2013 1115   AST 56* 07/15/2013 1021   ALT 42* 07/23/2013 1115   ALT 51 07/15/2013 1021   BILITOT 0.8 07/23/2013 1115   BILITOT 1.04 07/15/2013 1021        Knee X-rays 08/04/13 - B/L joint space narrowing Medial>lateral, osteophyte formation at the medial joint line and osteosclerosis

## 2013-09-17 NOTE — Assessment & Plan Note (Addendum)
Most likely related to indomethocin use along with her alcohol abuse.  She is >55 and some weight loss (10-15 lbs in past 2 yrs), but otherwise no red flags.  If continues to have decreased appetite and generalized abdominal pain, will consider evaluation by GI with possible EGD for r/o malignancy, PUD, or other etiology.  Continue with PPI for now, will stop her NSAID.   As well, her anastrozole could be a culprit for nausea, GI distress, vomiting as well as some weight loss.

## 2013-09-17 NOTE — Patient Instructions (Signed)
Ms. Cunliffe, please start using the Cymbalta, 30 mg once per day for the next two weeks.  At that time if it is working for you, you may increase to 60 mg once per day.  I will see you back in about 6 weeks, at which point we will see how you are doing.  Please stop taking the Indomethocin as this could be causing some of your belly pain!  Thanks, Dr. Awanda Mink

## 2013-09-17 NOTE — Assessment & Plan Note (Addendum)
Knee X-rays showing worsening OA B/L, R>L.  Will start cymbalta in pt and stop indomethocin as she could not tolerate the tramadol 2/2 drowsiness.  Not a candidate for tylenol as well.  F/U in 6 weeks, if minimal improvement at that point, would consider repeat injection.  Explained that she is headed towards TKA, but with her alcohol abuse she would not be a candidate, and she did not voice a desire to quit.  As well, abductor and quad strengthening exercise given.  Consider topical tx at next visit if minimal improvement.

## 2013-09-17 NOTE — Assessment & Plan Note (Signed)
Repeat 148/80, compliant with norvasc 5 mg qd.  Goal is < 150/90 with no known CKD or DM.

## 2013-09-17 NOTE — Assessment & Plan Note (Signed)
Does not want to stop drinking, counseled that if she would eventual need TKA B/L, she would need to stop.

## 2013-10-29 ENCOUNTER — Ambulatory Visit: Payer: Commercial Managed Care - HMO | Admitting: Family Medicine

## 2014-02-08 ENCOUNTER — Encounter: Payer: Self-pay | Admitting: Family Medicine

## 2014-02-08 ENCOUNTER — Ambulatory Visit (INDEPENDENT_AMBULATORY_CARE_PROVIDER_SITE_OTHER): Payer: Commercial Managed Care - HMO | Admitting: Family Medicine

## 2014-02-08 VITALS — BP 126/78 | HR 97 | Temp 98.4°F | Ht 60.0 in | Wt 170.2 lb

## 2014-02-08 DIAGNOSIS — M25562 Pain in left knee: Secondary | ICD-10-CM

## 2014-02-08 DIAGNOSIS — M25561 Pain in right knee: Secondary | ICD-10-CM | POA: Insufficient documentation

## 2014-02-08 MED ORDER — METHYLPREDNISOLONE ACETATE 40 MG/ML IJ SUSP
40.0000 mg | Freq: Once | INTRAMUSCULAR | Status: AC
  Administered 2014-02-08: 40 mg via INTRA_ARTICULAR

## 2014-02-08 NOTE — Assessment & Plan Note (Addendum)
Right knee:  Informed consent obtained and placed in chart.  Time out performed.  Both Right and Left knee cleaned with iodine x 3 and wiped clear with alcohol swab.  Using 25 gauge needle 1 cc Depomedrol and 3 cc's 1% Lidocaine were injected via lateral approach into joint space. Sterile bandage placed.  Patient tolerated procedure well.  No complications.    Left knee:  Informed consent obtained and placed in chart.  Time out performed.  Area cleaned with iodine x 3 and wiped clear with alcohol swab.  Using 25 gauge needle 1 cc Depomedrol and 3 cc's 1% Lidocaine were injected into joint space via lateral approach.  Sterile bandage placed.  Patient tolerated procedure well.  No complications.    Warned against steroid flare.  To continue oral analgesics, ice/exercise as she has been.  FU with PCP in 4-6 weeks to assess for improvement.

## 2014-02-08 NOTE — Progress Notes (Signed)
Subjective:    Carrie [name] is a 63 y.o. female who presents to [facility] today for knee pain:  1.  BL knee pain:  Previously, Right knee has been the one to cause her problems.  Now is bilateral.  Describes aching pain worse since change in weather.  Left knee has locked on her, given way once.  States she is NOT doing the strengthening exercises provided by Dr. Awanda Mink -- she lost the sheet.   Last had cortisone shot with good relief in Right knee.  Has never had this in Left kne.    ROS as above per HPI, otherwise neg.   The following portions of the patient's history were reviewed and updated as appropriate: allergies, current medications, past medical history, family and social history, and problem list. Patient is a nonsmoker.    PMH reviewed.  Past Medical History  Diagnosis Date  . Breast cancer, left breast 07/17/2011  . Hypokalemia 07/17/2011  . Hepatic steatosis 07/17/2011   No past surgical history on file.  Medications reviewed. Current Outpatient Prescriptions  Medication Sig Dispense Refill  . albuterol (VENTOLIN HFA) 108 (90 BASE) MCG/ACT inhaler Inhale 2 puffs into the lungs every 6 (six) hours as needed for wheezing or shortness of breath. 1 Inhaler 1  . amLODipine (NORVASC) 2.5 MG tablet Take 2 tablets (5 mg total) by mouth daily. 90 tablet 1  . anastrozole (ARIMIDEX) 1 MG tablet Take 1 tablet (1 mg total) by mouth daily. 90 tablet 3  . cyclobenzaprine (FLEXERIL) 10 MG tablet Take 1 tablet (10 mg total) by mouth 2 (two) times daily as needed for muscle spasms. 20 tablet 0  . DULoxetine (CYMBALTA) 30 MG capsule Take 1 capsule (30 mg total) by mouth daily. 60 capsule 0  . pantoprazole (PROTONIX) 40 MG tablet Take 1 tablet (40 mg total) by mouth daily. 30 tablet 3   No current facility-administered medications for this visit.     Objective:   Physical Exam BP 126/78 mmHg  Pulse 97  Temp(Src) 98.4 F (36.9 C) (Oral)  Ht 5' (1.524 m)  Wt 170 lb 3.2 oz (77.202 kg)  BMI  33.24 kg/m2 Gen:  Alert, cooperative patient who appears stated age in no acute distress.  Vital signs reviewed. MSK:  Right knee: No redness or swelling noted, minimal joint effusion noted.  Good passive/active range of motion of knee but does have crepitus noted.  Internal and external hip rotation good without pain.  No tenderness at medial or lateral joint line.  Unable to appreciate any joint effusion.  Anterior/posterior drawer tests, McMurray's, Lachmann's testing all negative.   Left knee:  Same as right.  However, with poorer muscle strength quadricep and hamstring on that side.  Also with crepitus patello-femoral testing.   Ext:  No LE edema.        No results found for this or any previous visit (from the past 72 hour(s)).

## 2014-02-21 ENCOUNTER — Encounter: Payer: Self-pay | Admitting: Family Medicine

## 2014-02-21 ENCOUNTER — Ambulatory Visit (INDEPENDENT_AMBULATORY_CARE_PROVIDER_SITE_OTHER): Payer: Commercial Managed Care - HMO | Admitting: Family Medicine

## 2014-02-21 VITALS — BP 143/92 | HR 101 | Temp 98.0°F | Resp 18 | Wt 167.0 lb

## 2014-02-21 DIAGNOSIS — Z131 Encounter for screening for diabetes mellitus: Secondary | ICD-10-CM

## 2014-02-21 DIAGNOSIS — Z79899 Other long term (current) drug therapy: Secondary | ICD-10-CM

## 2014-02-21 DIAGNOSIS — F1010 Alcohol abuse, uncomplicated: Secondary | ICD-10-CM

## 2014-02-21 LAB — POCT GLYCOSYLATED HEMOGLOBIN (HGB A1C): Hemoglobin A1C: 5.7

## 2014-02-21 NOTE — Progress Notes (Signed)
   Subjective:    Patient ID: Carrie [name], female    DOB: December 28, 1950, 63 y.o.   MRN: [id number]  CC: Increased thirst, falling.  HPI  63 year old female with PMH of alcohol abuse presents to the clinic today with recent increased thirst and frequent falls.  She is accompanied by a close friend today.  History provided by friend and the patient.  1) Increased thirst, falls.  Patient is concerned by recent events: increased thirst and alcohol intake, falls, not feeling well.  She reports that she has had recent incontinence as well.  Given her family history of DM (Mother and Brother), she is concerned about having diabetes.  She reports recent polyuria and questionable polydipsia (she has increased her alcohol intake recently).  She has had some weight loss, but has poor PO intake.  Her friend with her today is very concerned about her health and well being.  She has noticed a significant increase in alcohol intake (patient is drinking ~ 8 drinks daily).  She states that Ms. Grefe has been more "lethargic" and has been less social/interactive.  She has noticed a significant decline over the past 2 months.   Review of Systems Per HPI with the following additions: Patient denies head trauma, chest pain, SOB, abdominal pain.    Objective:   Physical Exam Filed Vitals:   02/21/14 1633  BP: 143/92  Pulse: 101  Temp: 98 F (36.7 C)  Resp: 18   Exam: General: chronically ill appearing female in NAD.  Cardiovascular: RRR. No murmurs, rubs, or gallops. Respiratory: CTAB. No rales, rhonchi, or wheeze. Abdomen: soft, nontender, nondistended. Liver palpable  Neuro: Alert and oriented. No focal deficits on exam.    Assessment & Plan:  See Problem List

## 2014-02-22 DIAGNOSIS — Z131 Encounter for screening for diabetes mellitus: Secondary | ICD-10-CM | POA: Insufficient documentation

## 2014-02-22 NOTE — Assessment & Plan Note (Signed)
Given reported symptoms and family hx, screening A1C was done today. A1C was 5.7 (prediabetes).

## 2014-02-22 NOTE — Assessment & Plan Note (Signed)
This is the underlying culprit for the patient's symptoms and decline. We had a long discussion about the repercussions of continued alcohol abuse.    I discussed community resources and treatment options. I advised her to cut back on her intake and she agreed to "try". Patient to follow up closely with PCP.

## 2014-03-15 ENCOUNTER — Ambulatory Visit (INDEPENDENT_AMBULATORY_CARE_PROVIDER_SITE_OTHER): Payer: Commercial Managed Care - HMO | Admitting: Family Medicine

## 2014-03-15 ENCOUNTER — Encounter: Payer: Self-pay | Admitting: Family Medicine

## 2014-03-15 ENCOUNTER — Ambulatory Visit (INDEPENDENT_AMBULATORY_CARE_PROVIDER_SITE_OTHER): Payer: Commercial Managed Care - HMO | Admitting: *Deleted

## 2014-03-15 ENCOUNTER — Other Ambulatory Visit (HOSPITAL_COMMUNITY)
Admission: RE | Admit: 2014-03-15 | Discharge: 2014-03-15 | Disposition: A | Discharge status: Home / Self Care | Payer: Commercial Managed Care - HMO | Source: Ambulatory Visit | Attending: Family Medicine | Admitting: Family Medicine

## 2014-03-15 VITALS — BP 148/88 | HR 84 | Temp 98.5°F | Ht 60.0 in | Wt 174.0 lb

## 2014-03-15 DIAGNOSIS — K709 Alcoholic liver disease, unspecified: Secondary | ICD-10-CM

## 2014-03-15 DIAGNOSIS — Z23 Encounter for immunization: Secondary | ICD-10-CM

## 2014-03-15 DIAGNOSIS — Z124 Encounter for screening for malignant neoplasm of cervix: Secondary | ICD-10-CM

## 2014-03-15 DIAGNOSIS — R740 Nonspecific elevation of levels of transaminase and lactic acid dehydrogenase [LDH]: Secondary | ICD-10-CM

## 2014-03-15 DIAGNOSIS — I10 Essential (primary) hypertension: Secondary | ICD-10-CM

## 2014-03-15 DIAGNOSIS — D689 Coagulation defect, unspecified: Secondary | ICD-10-CM

## 2014-03-15 DIAGNOSIS — Z1151 Encounter for screening for human papillomavirus (HPV): Secondary | ICD-10-CM | POA: Insufficient documentation

## 2014-03-15 DIAGNOSIS — R7401 Elevation of levels of liver transaminase levels: Secondary | ICD-10-CM

## 2014-03-15 DIAGNOSIS — Z0000 Encounter for general adult medical examination without abnormal findings: Secondary | ICD-10-CM | POA: Insufficient documentation

## 2014-03-15 DIAGNOSIS — E559 Vitamin D deficiency, unspecified: Secondary | ICD-10-CM

## 2014-03-15 DIAGNOSIS — F1010 Alcohol abuse, uncomplicated: Secondary | ICD-10-CM

## 2014-03-15 LAB — COMPREHENSIVE METABOLIC PANEL
ALT: 38 U/L — AB (ref 0–35)
AST: 44 U/L — AB (ref 0–37)
Albumin: 4.4 g/dL (ref 3.5–5.2)
Alkaline Phosphatase: 53 U/L (ref 39–117)
BILIRUBIN TOTAL: 0.6 mg/dL (ref 0.2–1.2)
BUN: 7 mg/dL (ref 6–23)
CHLORIDE: 97 meq/L (ref 96–112)
CO2: 27 meq/L (ref 19–32)
Calcium: 9.8 mg/dL (ref 8.4–10.5)
Creat: 0.7 mg/dL (ref 0.50–1.10)
Glucose, Bld: 89 mg/dL (ref 70–99)
POTASSIUM: 3.8 meq/L (ref 3.5–5.3)
SODIUM: 135 meq/L (ref 135–145)
Total Protein: 8 g/dL (ref 6.0–8.3)

## 2014-03-15 LAB — CBC
HEMATOCRIT: 38.1 % (ref 36.0–46.0)
Hemoglobin: 13.3 g/dL (ref 12.0–15.0)
MCH: 31.5 pg (ref 26.0–34.0)
MCHC: 34.9 g/dL (ref 30.0–36.0)
MCV: 90.3 fL (ref 78.0–100.0)
MPV: 12.2 fL (ref 9.4–12.4)
Platelets: 334 10*3/uL (ref 150–400)
RBC: 4.22 MIL/uL (ref 3.87–5.11)
RDW: 14.8 % (ref 11.5–15.5)
WBC: 5.8 10*3/uL (ref 4.0–10.5)

## 2014-03-15 LAB — PROTIME-INR
INR: 0.95 (ref <1.50)
PROTHROMBIN TIME: 12.7 s (ref 11.6–15.2)

## 2014-03-15 MED ORDER — ZOSTER VACCINE LIVE 19400 UNT/0.65ML ~~LOC~~ SOLR: 0.6500 mL | Freq: Once | SUBCUTANEOUS | Status: DC

## 2014-03-15 NOTE — Addendum Note (Signed)
Addended by: Levert Feinstein F on: 03/15/2014 03:04 PM   Modules accepted: Orders

## 2014-03-15 NOTE — Patient Instructions (Signed)
Chemical Dependency  Chemical dependency is an addiction to drugs or alcohol. It is characterized by the repeated behavior of seeking out and using drugs and alcohol despite harmful consequences to the health and safety of ones self and others.   RISK FACTORS  There are certain situations or behaviors that increase a person's risk for chemical dependency. These include:  · A family history of chemical dependency.  · A history of mental health issues, including depression and anxiety.  · A home environment where drugs and alcohol are easily available to you.  · Drug or alcohol use at a young age.  SYMPTOMS   The following symptoms can indicate chemical dependency:  · Inability to limit the use of drugs or alcohol.  · Nausea, sweating, shakiness, and anxiety that occurs when alcohol or drugs are not being used.  · An increase in amount of drugs or alcohol that is necessary to get drunk or high.  People who experience these symptoms can assess their use of drugs and alcohol by asking themselves the following questions:  · Have you been told by friends or family that they are worried about your use of alcohol or drugs?  · Do friends and family ever tell you about things you did while drinking alcohol or using drugs that you do not remember?  · Do you lie about using alcohol or drugs or about the amounts you use?  · Do you have difficulty completing daily tasks unless you use alcohol or drugs?  · Is the level of your work or school performance lower because of your drug or alcohol use?  · Do you get sick from using drugs or alcohol but keep using anyway?  · Do you feel uncomfortable in social situations unless you use alcohol or drugs?  · Do you use drugs or alcohol to help forget problems?   An answer of yes to any of these questions may indicate chemical dependency. Professional evaluation is suggested.  Document Released: 03/12/2001 Document Revised: 06/10/2011 Document Reviewed: 05/24/2010  Exit[employer]® Patient  Information ©2015 [employer]. This information is not intended to replace advice given to you by your health care provider. Make sure you discuss any questions you have with your health care provider.

## 2014-03-15 NOTE — Assessment & Plan Note (Signed)
Secondary to alcohol abuse, most likely alcoholic steatitis.  - CMET today to evaluate again - US abdomen to evaluate for cirrhosis/ascites. - F/U in 4 weeks

## 2014-03-15 NOTE — Assessment & Plan Note (Addendum)
+   CAGE with eye opener.  6+ drinks per day for > 10 yrs.  No evidence of end stage liver disease on exam today including HSM, ascites, caput medusa, palmar erythema, spider angioma.  Labwork today to include CMET, CBC (platelet evaluation), PT/INR.  Previously negative hepatitis panel and do not see value in repeating today w/o new exposure.  Will obtain HIV though.  Resources given to help with alcohol abuse including [facility], LCSW card, addictive resources in community.

## 2014-03-15 NOTE — Assessment & Plan Note (Signed)
PAP performed today - Tdap and Influenza given - Zostavax given to take to pharmacy -

## 2014-03-15 NOTE — Addendum Note (Signed)
Addended by: Levert Feinstein F on: 03/15/2014 03:27 PM   Modules accepted: Orders

## 2014-03-15 NOTE — Assessment & Plan Note (Signed)
Obtain 25 hydroxyvitamin D today  - Supplement according to levels.  - If < 20, start 50,000 U q weekly x 8-12 weeks, go to 2000 IU, recheck in 3-4 months, and continue at that if > 30.

## 2014-03-15 NOTE — Progress Notes (Signed)
Carrie [name] is a 63 y.o. female who presents today for yearly physical.  Preventative Care - Due for pap, TDap, Zostavax, and Influenza today.   Alcohol Abuse - Pt continues to drink about 8 beers per day.  Contemplating stopping but interested in doing on own.  Does believe she has a problem.  Denies IVDA or risk for other transmissible disease.   HTN - Elevated today, states compliance with medications.  Denies palpitations, edema, HA, blurred vision, CP, SOB.    Past Medical History  Diagnosis Date  . Breast cancer, left breast 07/17/2011  . Hypokalemia 07/17/2011  . Hepatic steatosis 07/17/2011    History  Smoking status  . Never Smoker   Smokeless tobacco  . Never Used    Family History  Problem Relation Age of Onset  . Cancer Other   . Diabetes Mother   . Diabetes Brother   . Hypertension Mother   . Hypertension Father     Current Outpatient Prescriptions on File Prior to Visit  Medication Sig Dispense Refill  . albuterol (VENTOLIN HFA) 108 (90 BASE) MCG/ACT inhaler Inhale 2 puffs into the lungs every 6 (six) hours as needed for wheezing or shortness of breath. 1 Inhaler 1  . amLODipine (NORVASC) 2.5 MG tablet Take 2 tablets (5 mg total) by mouth daily. 90 tablet 1  . anastrozole (ARIMIDEX) 1 MG tablet Take 1 tablet (1 mg total) by mouth daily. 90 tablet 3  . cyclobenzaprine (FLEXERIL) 10 MG tablet Take 1 tablet (10 mg total) by mouth 2 (two) times daily as needed for muscle spasms. 20 tablet 0  . DULoxetine (CYMBALTA) 30 MG capsule Take 1 capsule (30 mg total) by mouth daily. 60 capsule 0  . pantoprazole (PROTONIX) 40 MG tablet Take 1 tablet (40 mg total) by mouth daily. 30 tablet 3   No current facility-administered medications on file prior to visit.    ROS: Per HPI.  All other systems reviewed and are negative.   Physical Exam Filed Vitals:   03/15/14 1417  BP: 176/87  Pulse: 84  Temp: 98.5 F (36.9 C)    Physical Examination: General appearance - alert,  well appearing, and in no distress Chest - clear to auscultation, no wheezes, rales or rhonchi, symmetric air entry Heart - normal rate, regular rhythm, normal S1, S2, no murmurs, rubs, clicks or gallops Abdomen - soft, nontender, nondistended, no masses or organomegaly     Chemistry      Component Value Date/Time   NA 135 07/23/2013 1115   NA 136 07/15/2013 1021   K 3.8 07/23/2013 1115   K 3.5 07/15/2013 1021   CL 96 07/23/2013 1115   CL 98 07/14/2012 1400   CO2 29 07/23/2013 1115   CO2 25 07/15/2013 1021   BUN 6 07/23/2013 1115   BUN 11.0 07/15/2013 1021   CREATININE 0.72 07/23/2013 1115   CREATININE 1.1 07/15/2013 1021   CREATININE 1.30* 04/25/2012 0410      Component Value Date/Time   CALCIUM 10.2 07/23/2013 1115   CALCIUM 10.2 07/15/2013 1021   ALKPHOS 58 07/23/2013 1115   ALKPHOS 64 07/15/2013 1021   AST 39* 07/23/2013 1115   AST 56* 07/15/2013 1021   ALT 42* 07/23/2013 1115   ALT 51 07/15/2013 1021   BILITOT 0.8 07/23/2013 1115   BILITOT 1.04 07/15/2013 1021

## 2014-03-15 NOTE — Assessment & Plan Note (Addendum)
Continue Norvasc 5 mg daily.  Repeat BP 148/88 L arm, sitting, manual cuff.  - CMET today  - Recheck BP in 4-6 weeks - If still elevated or micro:creatinine ratio elevated, would start low dose lisinopril and recheck BMET 4-8 wks after initiation.

## 2014-03-16 ENCOUNTER — Telehealth: Payer: Self-pay | Admitting: Family Medicine

## 2014-03-16 ENCOUNTER — Encounter: Payer: Self-pay | Admitting: Family Medicine

## 2014-03-16 LAB — CYTOLOGY - PAP

## 2014-03-16 LAB — HIV ANTIBODY (ROUTINE TESTING W REFLEX): HIV 1&2 Ab, 4th Generation: NONREACTIVE

## 2014-03-16 LAB — VITAMIN D 25 HYDROXY (VIT D DEFICIENCY, FRACTURES): Vit D, 25-Hydroxy: 5 ng/mL — ABNORMAL LOW (ref 30–100)

## 2014-03-16 MED ORDER — CHOLECALCIFEROL 1.25 MG (50000 UT) PO CAPS: 50000.0000 [IU] | ORAL_CAPSULE | ORAL | Status: DC

## 2014-03-16 NOTE — Telephone Encounter (Signed)
spoke with patient and gave her below message

## 2014-03-16 NOTE — Telephone Encounter (Signed)
Please let pt know her Vitamin D is extremely low.  I will call in Vit D 50,000 U, which she should take one, every week for 8 weeks.  Otherwise please let her know her HIV was negative and the rest of her lab work was basically normal.  Publishing rights manager R. Awanda Mink, DO of [facility] 03/16/2014, 8:52 AM

## 2014-03-21 ENCOUNTER — Ambulatory Visit (HOSPITAL_COMMUNITY)
Admission: RE | Admit: 2014-03-21 | Discharge: 2014-03-21 | Disposition: A | Discharge status: Home / Self Care | Payer: Commercial Managed Care - HMO | Source: Ambulatory Visit | Attending: Family Medicine | Admitting: Family Medicine

## 2014-03-21 DIAGNOSIS — F1010 Alcohol abuse, uncomplicated: Secondary | ICD-10-CM | POA: Insufficient documentation

## 2014-03-21 DIAGNOSIS — K769 Liver disease, unspecified: Secondary | ICD-10-CM | POA: Insufficient documentation

## 2014-03-21 DIAGNOSIS — R740 Nonspecific elevation of levels of transaminase and lactic acid dehydrogenase [LDH]: Secondary | ICD-10-CM | POA: Diagnosis not present

## 2014-03-21 DIAGNOSIS — R7401 Elevation of levels of liver transaminase levels: Secondary | ICD-10-CM

## 2014-04-14 NOTE — Addendum Note (Signed)
Addended by: Kennith Maes R on: 04/14/2014 12:47 PM   Modules accepted: Orders

## 2014-05-02 NOTE — Addendum Note (Signed)
Addended by: Kennith Maes R on: 05/02/2014 02:06 PM   Modules accepted: Orders

## 2014-05-11 ENCOUNTER — Telehealth: Payer: Self-pay | Admitting: Oncology

## 2014-05-11 NOTE — Telephone Encounter (Signed)
pt called needed appt...new problem transferred to triage

## 2014-05-27 ENCOUNTER — Other Ambulatory Visit: Payer: Self-pay | Admitting: Nurse Practitioner

## 2014-06-14 ENCOUNTER — Ambulatory Visit (INDEPENDENT_AMBULATORY_CARE_PROVIDER_SITE_OTHER): Payer: Commercial Managed Care - HMO | Admitting: Family Medicine

## 2014-06-14 ENCOUNTER — Encounter: Payer: Self-pay | Admitting: Family Medicine

## 2014-06-14 VITALS — BP 152/88 | HR 103 | Temp 98.0°F | Ht 60.0 in | Wt 172.0 lb

## 2014-06-14 DIAGNOSIS — M545 Low back pain, unspecified: Secondary | ICD-10-CM | POA: Insufficient documentation

## 2014-06-14 DIAGNOSIS — F1010 Alcohol abuse, uncomplicated: Secondary | ICD-10-CM | POA: Diagnosis not present

## 2014-06-14 DIAGNOSIS — M25561 Pain in right knee: Secondary | ICD-10-CM

## 2014-06-14 DIAGNOSIS — M25562 Pain in left knee: Secondary | ICD-10-CM

## 2014-06-14 MED ORDER — METHYLPREDNISOLONE ACETATE 40 MG/ML IJ SUSP
80.0000 mg | Freq: Once | INTRAMUSCULAR | Status: AC
  Administered 2014-06-14: 80 mg via INTRA_ARTICULAR

## 2014-06-14 NOTE — Addendum Note (Signed)
Addended by: Valerie Roys on: 06/14/2014 11:28 AM   Modules accepted: Orders

## 2014-06-14 NOTE — Patient Instructions (Signed)
Please get your back x-rays   Thanks, Dr. Awanda Mink

## 2014-06-14 NOTE — Assessment & Plan Note (Signed)
Right knee:  Informed consent obtained and placed in chart.  Time out performed.  Both Right and Left knee cleaned with iodine x 3 and wiped clear with alcohol swab.  Using 21 1/2 gauge needle 1 cc Depomedrol and 3 cc's 1% Lidocaine were injected via lateral approach into joint space. Sterile bandage placed.  Patient tolerated procedure well.  No complications.    Left knee:  Informed consent obtained and placed in chart.  Time out performed.  Area cleaned with iodine x 3 and wiped clear with alcohol swab.  Using 21 1/2 gauge needle 1 cc Depomedrol and 3 cc's 1% Lidocaine were injected into joint space via lateral approach.  Sterile bandage placed.  Patient tolerated procedure well.  No complications.    B/L Knee OA, consent obtained and willing to have done.  Discussed continuing on tylenol, mobic PRN, cymbalta (30 mg can be increased to 60).  Needs to stop drinking if eventual knee replacement to be considered.

## 2014-06-14 NOTE — Progress Notes (Signed)
Carrie [name] is a 64 y.o. female who presents today for yearly physical.  B/L Knee OA - Pain worsening, has had injection about 3 months ago.  Taking Tylenol 650 mg TID, which helps, occasional mobic, not doing exercises, cymbalta not working.   Alcohol Abuse - Pt continues to drink about 4-5 beers per day.  Contemplating stopping but interested in doing on own.  Does believe she has a problem.  Denies IVDA or risk for other transmissible disease.  Is willing to go to Detox at this point, does not completely grasp this may kill her.    Low Back Pain - Ongoing for several years now, no acute injury, denies paresthesias into her legs or weakness bowel/bladder issues.  Denies weight loss or fatigue.  Pain worse while walking which causing some leg pain when standing straight up.  Denies pain while bending forward.     Past Medical History  Diagnosis Date  . Breast cancer, left breast 07/17/2011  . Hypokalemia 07/17/2011  . Hepatic steatosis 07/17/2011    History  Smoking status  . Never Smoker   Smokeless tobacco  . Never Used    Family History  Problem Relation Age of Onset  . Cancer Other   . Diabetes Mother   . Diabetes Brother   . Hypertension Mother   . Hypertension Father     Current Outpatient Prescriptions on File Prior to Visit  Medication Sig Dispense Refill  . albuterol (VENTOLIN HFA) 108 (90 BASE) MCG/ACT inhaler Inhale 2 puffs into the lungs every 6 (six) hours as needed for wheezing or shortness of breath. 1 Inhaler 1  . amLODipine (NORVASC) 2.5 MG tablet Take 2 tablets (5 mg total) by mouth daily. 90 tablet 1  . anastrozole (ARIMIDEX) 1 MG tablet Take 1 tablet (1 mg total) by mouth daily. 90 tablet 3  . Cholecalciferol 50000 UNITS capsule Take 1 capsule (50,000 Units total) by mouth once a week. 12 capsule 0  . cyclobenzaprine (FLEXERIL) 10 MG tablet Take 1 tablet (10 mg total) by mouth 2 (two) times daily as needed for muscle spasms. 20 tablet 0  . DULoxetine  (CYMBALTA) 30 MG capsule Take 1 capsule (30 mg total) by mouth daily. 60 capsule 0  . pantoprazole (PROTONIX) 40 MG tablet Take 1 tablet (40 mg total) by mouth daily. 30 tablet 3  . zoster vaccine live, PF, (ZOSTAVAX) 41962 UNT/0.65ML injection Inject 19,400 Units into the skin once. 1 each 0   No current facility-administered medications on file prior to visit.    ROS: Per HPI.  All other systems reviewed and are negative.   Physical Exam Filed Vitals:   06/14/14 1043  BP: 152/88  Pulse: 103  Temp: 98 F (36.7 C)    Physical Examination: General appearance - alert, well appearing, and in no distress Chest - clear to auscultation, no wheezes, rales or rhonchi, symmetric air entry Heart - normal rate, regular rhythm, normal S1, S2, no murmurs, rubs, clicks or gallops Abdomen - soft, nontender, nondistended, no masses or organomegaly  Knee: B/L medial joint pain, no ecchymosis or effusions      Chemistry      Component Value Date/Time   NA 135 03/15/2014 1442   NA 136 07/15/2013 1021   K 3.8 03/15/2014 1442   K 3.5 07/15/2013 1021   CL 97 03/15/2014 1442   CL 98 07/14/2012 1400   CO2 27 03/15/2014 1442   CO2 25 07/15/2013 1021   BUN 7 03/15/2014 1442  BUN 11.0 07/15/2013 1021   CREATININE 0.70 03/15/2014 1442   CREATININE 1.1 07/15/2013 1021   CREATININE 1.30* 04/25/2012 0410      Component Value Date/Time   CALCIUM 9.8 03/15/2014 1442   CALCIUM 10.2 07/15/2013 1021   ALKPHOS 53 03/15/2014 1442   ALKPHOS 64 07/15/2013 1021   AST 44* 03/15/2014 1442   AST 56* 07/15/2013 1021   ALT 38* 03/15/2014 1442   ALT 51 07/15/2013 1021   BILITOT 0.6 03/15/2014 1442   BILITOT 1.04 07/15/2013 1021

## 2014-06-14 NOTE — Assessment & Plan Note (Signed)
Most likely DJD - Obtain Lumbar X-rays - F/U in 2-3 months - Consider PT

## 2014-06-14 NOTE — Assessment & Plan Note (Signed)
Again, long term alcohol abuse, still drinking 5+ beers per day, but has decreased her hard alcohol use - Long discussion about cessation, which she is giving pushback about but willing to go to behavioral health for detox discussion - CMET showing some transaminitis, liver US showing echogenicity c/w alcoholic steatohepatitis - F/U in 3 months.

## 2014-06-15 ENCOUNTER — Other Ambulatory Visit: Payer: Self-pay | Admitting: *Deleted

## 2014-06-15 MED ORDER — AMLODIPINE BESYLATE 2.5 MG PO TABS: 5.0000 mg | ORAL_TABLET | Freq: Every day | ORAL | Status: DC

## 2014-07-07 ENCOUNTER — Telehealth: Payer: Self-pay | Admitting: Clinical

## 2014-07-07 ENCOUNTER — Telehealth: Payer: Self-pay | Admitting: Family Medicine

## 2014-07-07 NOTE — Telephone Encounter (Signed)
CSW received a referral to provide pt with resources for substance abuse. CSW explored pt interest in inpatient/intensive outpatient or outpatient substance abuse counseling. Pt stated she would be interested in outpatient counseling. CSW provided pt with information on [facility] for Outpatient Substance Abuse Counseling. CSW encouraged pt to give her a call back with updates, pt agreed.  Hunt Oris, MSW, [place]

## 2014-07-07 NOTE — Telephone Encounter (Signed)
Pt says someone from [facility] called her and told her to call her doctor's office, did not see any notes in the system.

## 2014-07-08 ENCOUNTER — Telehealth: Payer: Self-pay | Admitting: Clinical

## 2014-07-08 NOTE — Telephone Encounter (Signed)
CSW received a call back from pt informing CSW that she has scheduled an appointment with [facility] next Tuesday at [place] praised pt for making an appointment. No additional needs.  Hunt Oris, MSW, [place]

## 2014-07-11 ENCOUNTER — Encounter (HOSPITAL_COMMUNITY): Payer: Self-pay | Admitting: General Practice

## 2014-07-11 ENCOUNTER — Ambulatory Visit (INDEPENDENT_AMBULATORY_CARE_PROVIDER_SITE_OTHER): Payer: Commercial Managed Care - HMO | Admitting: Family Medicine

## 2014-07-11 ENCOUNTER — Encounter: Payer: Self-pay | Admitting: Family Medicine

## 2014-07-11 ENCOUNTER — Inpatient Hospital Stay (HOSPITAL_COMMUNITY)
Admission: AD | Admit: 2014-07-11 | Discharge: 2014-07-13 | DRG: 378 | Disposition: A | Discharge status: Home / Self Care | Payer: Commercial Managed Care - HMO | Source: Ambulatory Visit | Attending: Family Medicine | Admitting: Family Medicine

## 2014-07-11 VITALS — BP 157/99 | HR 90 | Temp 98.7°F | Ht 60.0 in | Wt 172.0 lb

## 2014-07-11 DIAGNOSIS — K2970 Gastritis, unspecified, without bleeding: Secondary | ICD-10-CM | POA: Diagnosis not present

## 2014-07-11 DIAGNOSIS — D500 Iron deficiency anemia secondary to blood loss (chronic): Secondary | ICD-10-CM | POA: Diagnosis not present

## 2014-07-11 DIAGNOSIS — E876 Hypokalemia: Secondary | ICD-10-CM | POA: Diagnosis present

## 2014-07-11 DIAGNOSIS — K449 Diaphragmatic hernia without obstruction or gangrene: Secondary | ICD-10-CM | POA: Diagnosis not present

## 2014-07-11 DIAGNOSIS — F1020 Alcohol dependence, uncomplicated: Secondary | ICD-10-CM | POA: Diagnosis present

## 2014-07-11 DIAGNOSIS — Z853 Personal history of malignant neoplasm of breast: Secondary | ICD-10-CM

## 2014-07-11 DIAGNOSIS — K922 Gastrointestinal hemorrhage, unspecified: Secondary | ICD-10-CM | POA: Diagnosis present

## 2014-07-11 DIAGNOSIS — D62 Acute posthemorrhagic anemia: Secondary | ICD-10-CM | POA: Diagnosis not present

## 2014-07-11 DIAGNOSIS — K921 Melena: Principal | ICD-10-CM | POA: Diagnosis present

## 2014-07-11 DIAGNOSIS — K920 Hematemesis: Secondary | ICD-10-CM | POA: Diagnosis not present

## 2014-07-11 DIAGNOSIS — F1010 Alcohol abuse, uncomplicated: Secondary | ICD-10-CM | POA: Diagnosis not present

## 2014-07-11 DIAGNOSIS — N179 Acute kidney failure, unspecified: Secondary | ICD-10-CM | POA: Diagnosis not present

## 2014-07-11 DIAGNOSIS — Z923 Personal history of irradiation: Secondary | ICD-10-CM

## 2014-07-11 DIAGNOSIS — Z23 Encounter for immunization: Secondary | ICD-10-CM

## 2014-07-11 DIAGNOSIS — I10 Essential (primary) hypertension: Secondary | ICD-10-CM | POA: Diagnosis present

## 2014-07-11 DIAGNOSIS — K2960 Other gastritis without bleeding: Secondary | ICD-10-CM | POA: Diagnosis not present

## 2014-07-11 DIAGNOSIS — F17210 Nicotine dependence, cigarettes, uncomplicated: Secondary | ICD-10-CM | POA: Diagnosis present

## 2014-07-11 DIAGNOSIS — K219 Gastro-esophageal reflux disease without esophagitis: Secondary | ICD-10-CM | POA: Diagnosis not present

## 2014-07-11 DIAGNOSIS — K279 Peptic ulcer, site unspecified, unspecified as acute or chronic, without hemorrhage or perforation: Secondary | ICD-10-CM | POA: Diagnosis not present

## 2014-07-11 DIAGNOSIS — K259 Gastric ulcer, unspecified as acute or chronic, without hemorrhage or perforation: Secondary | ICD-10-CM | POA: Diagnosis not present

## 2014-07-11 DIAGNOSIS — B9681 Helicobacter pylori [H. pylori] as the cause of diseases classified elsewhere: Secondary | ICD-10-CM | POA: Diagnosis not present

## 2014-07-11 DIAGNOSIS — E559 Vitamin D deficiency, unspecified: Secondary | ICD-10-CM | POA: Diagnosis not present

## 2014-07-11 HISTORY — DX: Gastrointestinal hemorrhage, unspecified: K92.2

## 2014-07-11 HISTORY — DX: Essential (primary) hypertension: I10

## 2014-07-11 HISTORY — DX: Malignant neoplasm of unspecified site of right female breast: C50.911

## 2014-07-11 HISTORY — DX: Unspecified osteoarthritis, unspecified site: M19.90

## 2014-07-11 HISTORY — DX: Other chronic pain: G89.29

## 2014-07-11 HISTORY — DX: Low back pain, unspecified: M54.50

## 2014-07-11 HISTORY — DX: Low back pain: M54.5

## 2014-07-11 LAB — COMPREHENSIVE METABOLIC PANEL
ALK PHOS: 42 U/L (ref 39–117)
ALT: 24 U/L (ref 0–35)
ANION GAP: 13 (ref 5–15)
AST: 28 U/L (ref 0–37)
Albumin: 4 g/dL (ref 3.5–5.2)
BILIRUBIN TOTAL: 0.7 mg/dL (ref 0.3–1.2)
BUN: 40 mg/dL — AB (ref 6–23)
CHLORIDE: 93 mmol/L — AB (ref 96–112)
CO2: 23 mmol/L (ref 19–32)
CREATININE: 1.3 mg/dL — AB (ref 0.50–1.10)
Calcium: 9.4 mg/dL (ref 8.4–10.5)
GFR, EST AFRICAN AMERICAN: 50 mL/min — AB (ref >90)
GFR, EST NON AFRICAN AMERICAN: 43 mL/min — AB (ref >90)
GLUCOSE: 119 mg/dL — AB (ref 70–99)
POTASSIUM: 3.1 mmol/L — AB (ref 3.5–5.1)
Sodium: 129 mmol/L — ABNORMAL LOW (ref 135–145)
Total Protein: 7.6 g/dL (ref 6.0–8.3)

## 2014-07-11 LAB — PROTIME-INR
INR: 1.01 (ref 0.00–1.49)
Prothrombin Time: 13.4 seconds (ref 11.6–15.2)

## 2014-07-11 LAB — TYPE AND SCREEN
ABO/RH(D): O POS
ANTIBODY SCREEN: NEGATIVE

## 2014-07-11 LAB — RETICULOCYTES
RBC.: 3 MIL/uL — AB (ref 3.87–5.11)
RETIC COUNT ABSOLUTE: 102 10*3/uL (ref 19.0–186.0)
RETIC CT PCT: 3.4 % — AB (ref 0.4–3.1)

## 2014-07-11 LAB — CBC WITH DIFFERENTIAL/PLATELET
BASOS PCT: 0 % (ref 0–1)
Basophils Absolute: 0 10*3/uL (ref 0.0–0.1)
EOS PCT: 0 % (ref 0–5)
Eosinophils Absolute: 0 10*3/uL (ref 0.0–0.7)
HEMATOCRIT: 26.8 % — AB (ref 36.0–46.0)
HEMOGLOBIN: 9.5 g/dL — AB (ref 12.0–15.0)
Lymphocytes Relative: 26 % (ref 12–46)
Lymphs Abs: 2.6 10*3/uL (ref 0.7–4.0)
MCH: 31.7 pg (ref 26.0–34.0)
MCHC: 35.4 g/dL (ref 30.0–36.0)
MCV: 89.3 fL (ref 78.0–100.0)
MONO ABS: 1 10*3/uL (ref 0.1–1.0)
MONOS PCT: 10 % (ref 3–12)
Neutro Abs: 6.5 10*3/uL (ref 1.7–7.7)
Neutrophils Relative %: 64 % (ref 43–77)
Platelets: 302 10*3/uL (ref 150–400)
RBC: 3 MIL/uL — ABNORMAL LOW (ref 3.87–5.11)
RDW: 13.1 % (ref 11.5–15.5)
WBC: 10.2 10*3/uL (ref 4.0–10.5)

## 2014-07-11 LAB — ABO/RH: ABO/RH(D): O POS

## 2014-07-11 LAB — POCT HEMOGLOBIN: Hemoglobin: 8.9 g/dL — AB (ref 12.2–16.2)

## 2014-07-11 LAB — APTT: aPTT: 27 seconds (ref 24–37)

## 2014-07-11 MED ORDER — THIAMINE HCL 100 MG/ML IJ SOLN
Freq: Once | INTRAVENOUS | Status: AC
  Administered 2014-07-11: 18:00:00 via INTRAVENOUS
  Filled 2014-07-11: qty 1000

## 2014-07-11 MED ORDER — SODIUM CHLORIDE 0.9 % IV SOLN
INTRAVENOUS | Status: DC
  Administered 2014-07-12: 04:00:00 via INTRAVENOUS

## 2014-07-11 MED ORDER — SODIUM CHLORIDE 0.9 % IV BOLUS (SEPSIS)
1000.0000 mL | Freq: Once | INTRAVENOUS | Status: AC
  Administered 2014-07-11: 1000 mL via INTRAVENOUS

## 2014-07-11 MED ORDER — SODIUM CHLORIDE 0.9 % IJ SOLN
3.0000 mL | Freq: Two times a day (BID) | INTRAMUSCULAR | Status: DC
  Administered 2014-07-11 – 2014-07-13 (×3): 3 mL via INTRAVENOUS

## 2014-07-11 MED ORDER — PNEUMOCOCCAL VAC POLYVALENT 25 MCG/0.5ML IJ INJ
0.5000 mL | INJECTION | INTRAMUSCULAR | Status: AC
  Administered 2014-07-13: 0.5 mL via INTRAMUSCULAR
  Filled 2014-07-11 (×2): qty 0.5

## 2014-07-11 MED ORDER — FOLIC ACID 5 MG/ML IJ SOLN
1.0000 mg | Freq: Every day | INTRAMUSCULAR | Status: DC
  Administered 2014-07-11: 1 mg via INTRAVENOUS
  Filled 2014-07-11 (×3): qty 0.2

## 2014-07-11 MED ORDER — ALBUTEROL SULFATE (2.5 MG/3ML) 0.083% IN NEBU: 2.5000 mg | INHALATION_SOLUTION | Freq: Four times a day (QID) | RESPIRATORY_TRACT | Status: DC | PRN

## 2014-07-11 MED ORDER — LORAZEPAM 1 MG PO TABS: 1.0000 mg | ORAL_TABLET | Freq: Four times a day (QID) | ORAL | Status: DC | PRN

## 2014-07-11 MED ORDER — SODIUM CHLORIDE 0.9 % IV SOLN: INTRAVENOUS | Status: DC

## 2014-07-11 MED ORDER — PANTOPRAZOLE SODIUM 40 MG IV SOLR
80.0000 mg | Freq: Two times a day (BID) | INTRAVENOUS | Status: DC
  Administered 2014-07-11 – 2014-07-13 (×4): 80 mg via INTRAVENOUS
  Filled 2014-07-11 (×8): qty 80

## 2014-07-11 MED ORDER — LORAZEPAM 2 MG/ML IJ SOLN: 1.0000 mg | Freq: Four times a day (QID) | INTRAMUSCULAR | Status: DC | PRN

## 2014-07-11 MED ORDER — ADULT MULTIVITAMIN W/MINERALS CH
1.0000 | ORAL_TABLET | Freq: Every day | ORAL | Status: DC
  Administered 2014-07-13: 1 via ORAL
  Filled 2014-07-11 (×2): qty 1

## 2014-07-11 MED ORDER — ALBUTEROL SULFATE HFA 108 (90 BASE) MCG/ACT IN AERS: 2.0000 | INHALATION_SPRAY | Freq: Four times a day (QID) | RESPIRATORY_TRACT | Status: DC | PRN

## 2014-07-11 MED ORDER — THIAMINE HCL 100 MG/ML IJ SOLN
100.0000 mg | Freq: Every day | INTRAMUSCULAR | Status: DC
  Filled 2014-07-11 (×2): qty 1

## 2014-07-11 NOTE — H&P (Signed)
[facility] Admission History and Physical Service Pager: [phone]  Patient name: Carrie [name] Medical record number: [id number] Date of birth: 11/07/1950 Age: 64 y.o. Gender: female  Primary Care Provider: Kennith Maes, DO Consultants: Gastroenterology Code Status:Full  Chief Complaint: Melena and hematemesis  Assessment and Plan: Carrie [name] is a 64 y.o. female directly admitted to [facility] 4/11 for presumed upper GI bleeding. PMH is significant for alcohol abuse, GERD, gastritis, HTN, and breast cancer s/p radiation.   Upper GI bleed: Melena, hematemesis and anemia (hgb 8.9 from 13-14 baseline). Presumably due to PUD, gastritis (documented without EGD that I can find), and possibly varices given EtOH history. Other possibilities include Mallory-Weiss tear as retching preceded hematemesis, and erosive (history of GERD) and radiation esophagitis (s/p radiation for invasive ductal CA in left breast in 2011). Not characteristic of LGIB by history. Had colonoscopy by Dr. Michail Sermon, [facility], 08/27/2012, with diverticulosis and internal hemorrhoids. Neither contributing at this time.  - STAT CBC w/Diff, type & screen, coags; also CMP and B12 (given history of gastritis) - [facility] consulted as EGD would be very helpful diagnostically and possibly therapeutically; made NPO - Bilateral IVs; 1L NS bolus and start banana bag; repeat NS bolus as indicated by heart rate.  - IV PPI - FOBT - Monitor H/H   Alcohol abuse: Since age of 45, recently diminished intake. Never attempted to quit before, never DTs. Has appointment with [facility] to discuss entering recovery program. No signs of withdrawal on exam.  - CIWA - Banana bag now, supplement folate, thiamine daily - Check Mg, Phos, LFTs  Acute blood loss anemia: Presumed. Hemodynamically stable while awaiting labs.  - Labs as above, continue monitoring. No history of anemia at baseline.  - Likely threshold for transfusion is  hgb 7.0 mg/dl  HTN: Hold anti-HTN and add back as indicated - RN to call with BP > 160/100 for prn order - Consider echo given alcoholic history to r/o cardiomyopathy or if dyspnea on exertion is not also improve with correction of anemia.    History of breast cancer: - Continue anastrazole once po  FEN/GI: Banana bag @125cc /hr, NPO Prophylaxis: SCDs  Disposition: Admit   History of Present Illness: Carrie [name] is a 64 y.o. female presenting with black tarry stools.   She presented to the [facility] to discuss black tarry stools for the previous 5 days as well as emesis 1-2 times per day with frank blood (not streaks, not speckled; estimated at 1/2 cup) over the same time. She has felt fatigued, dizzy when standing, and nauseous for the past week causing her to not take any medications and drink less than usual. Her abdomen is generally tender without any localization. She also endorses intermittent palpitations and dyspnea on exertion for the past 1-2 weeks.   No fevers, chills, HA, vision changes.  She has a history of significant alcohol use since age 59 which has recently been about 1L of brandy (recently switched from vodka) per day. She was only able to drink about 2-3 beers per day during this recent illness. She has never tried to quit and denies having a history of DTs or recent tremors. She takes a PPI for GERD and gastritis without documented EGD. She takes anastrazole daily since May 2011 when she completed XRT for left breast cancer. She denies a personal or family history of liver disease, DM, IBS, IBD.   Has no objections to transfusion of blood products.  Review Of Systems: Per HPI. Otherwise 12 point review of systems was performed and was unremarkable.  Patient Active Problem List   Diagnosis Date Noted  . Melena 07/11/2014  . UGI bleed 07/11/2014  . UGIB (upper gastrointestinal bleed) 07/11/2014  . Low back pain 06/14/2014  . Preventative health care 03/15/2014  .  Diabetes mellitus screening 02/22/2014  . Bilateral knee pain 02/08/2014  . Gastritis 09/17/2013  . Right knee pain 08/04/2013  . Left knee pain 08/04/2013  . Transaminitis 07/23/2013  . Essential hypertension, benign 07/23/2013  . GERD (gastroesophageal reflux disease) 07/23/2013  . Vitamin D deficiency 07/22/2013  . Breast cancer, left breast 07/17/2011  . FIBROIDS, UTERUS 01/30/2007  . Alcohol abuse 01/30/2007   Past Medical History: Past Medical History  Diagnosis Date  . Breast cancer, left breast 07/17/2011  . Hypokalemia 07/17/2011  . Hepatic steatosis 07/17/2011   Past Surgical History: No past surgical history on file. Social History: History  Substance Use Topics  . Smoking status: Never Smoker   . Smokeless tobacco: Never Used  . Alcohol Use: 18.0 oz/week    30 Cans of beer per week   Additional social history: Denies smoking cigarettes, smokeless tobacco. Endorses smoking a dime of marijuana a day.  Please also refer to relevant sections of EMR.  Family History: Family History  Problem Relation Age of Onset  . Cancer Other   . Diabetes Mother   . Diabetes Brother   . Hypertension Mother   . Hypertension Father    Allergies and Medications: No Known Allergies No current facility-administered medications on file prior to encounter.   Current Outpatient Prescriptions on File Prior to Encounter  Medication Sig Dispense Refill  . albuterol (VENTOLIN HFA) 108 (90 BASE) MCG/ACT inhaler Inhale 2 puffs into the lungs every 6 (six) hours as needed for wheezing or shortness of breath. 1 Inhaler 1  . amLODipine (NORVASC) 2.5 MG tablet Take 2 tablets (5 mg total) by mouth daily. 90 tablet 1  . anastrozole (ARIMIDEX) 1 MG tablet Take 1 tablet (1 mg total) by mouth daily. 90 tablet 3  . Cholecalciferol 50000 UNITS capsule Take 1 capsule (50,000 Units total) by mouth once a week. 12 capsule 0  . cyclobenzaprine (FLEXERIL) 10 MG tablet Take 1 tablet (10 mg total) by mouth  2 (two) times daily as needed for muscle spasms. 20 tablet 0  . DULoxetine (CYMBALTA) 30 MG capsule Take 1 capsule (30 mg total) by mouth daily. 60 capsule 0  . pantoprazole (PROTONIX) 40 MG tablet Take 1 tablet (40 mg total) by mouth daily. 30 tablet 3   Objective: BP 132/84 mmHg  Pulse 101  Temp(Src) 98.2 F (36.8 C) (Oral)  Resp 16  Ht 5' (1.524 m)  Wt 162 lb 14.7 oz (73.9 kg)  BMI 31.82 kg/m2 Exam: General: Pleasant 64 yo female appearing older than stated age in no distress HEENT: Dry mucous membranes, muddy, anicteric sclerae, PERRL without nystagmus Cardiovascular: Mildly tachycardic rate, no JVD, no LE edema, DP, PT, and radial pulses 2+ symmetrically Respiratory: Poor effort but clear, symmetrical excursions; nonlabored on room air Abdomen: No surgical scars, soft, tender only to deep palpation generally L > R mid-lower quadrants; no rebound or guarding. Liver non tender, edge not palpated, murphy's neg. No apparent ascites.  Extremities: Dry, warm, well perfused with evidence of mild onychomycosis bilateral feet. No edema.  Skin: Dry without rashes, wounds. No telangiectasias. Not jaundiced Neuro: Alert and oriented with normal speech. No nystagmus or cranial  nerve deficits. Strength 5/5 bilaterally in proximal and distal muscle groups of all extremities.   Labs and Imaging:  Lab 04/11  1355  HGB 8.9*    Patrecia Pour, MD 07/11/2014, 4:49 PM PGY-2, [place] [facility] pager: [phone], text pages welcome

## 2014-07-11 NOTE — Progress Notes (Signed)
Carrie [name] is a 64 y.o. female who presents today for melena, some slight hematemesis, dizziness, fatigue.  Fatigue/melena/hematemesis - Pt with 5 days of ongoing GI Sx but having "dark stools" since the beginning but denies any BRBPR.  She has also had some hematochezia over the past two to three days now.  Having some fatigue and lightheadedness but denies any SOB.  Has had decreased PO intake.  Known alcoholic with more than 6 beers per day for the past 30 years.  She has still been drinking 2-3 beers per day during this time.  Denies CP, palpitations, or blurred vision.    Past Medical History  Diagnosis Date  . Breast cancer, left breast 07/17/2011  . Hypokalemia 07/17/2011  . Hepatic steatosis 07/17/2011    History  Smoking status  . Never Smoker   Smokeless tobacco  . Never Used    Family History  Problem Relation Age of Onset  . Cancer Other   . Diabetes Mother   . Diabetes Brother   . Hypertension Mother   . Hypertension Father     Current Outpatient Prescriptions on File Prior to Visit  Medication Sig Dispense Refill  . albuterol (VENTOLIN HFA) 108 (90 BASE) MCG/ACT inhaler Inhale 2 puffs into the lungs every 6 (six) hours as needed for wheezing or shortness of breath. 1 Inhaler 1  . amLODipine (NORVASC) 2.5 MG tablet Take 2 tablets (5 mg total) by mouth daily. 90 tablet 1  . anastrozole (ARIMIDEX) 1 MG tablet Take 1 tablet (1 mg total) by mouth daily. 90 tablet 3  . Cholecalciferol 50000 UNITS capsule Take 1 capsule (50,000 Units total) by mouth once a week. 12 capsule 0  . cyclobenzaprine (FLEXERIL) 10 MG tablet Take 1 tablet (10 mg total) by mouth 2 (two) times daily as needed for muscle spasms. 20 tablet 0  . DULoxetine (CYMBALTA) 30 MG capsule Take 1 capsule (30 mg total) by mouth daily. 60 capsule 0  . pantoprazole (PROTONIX) 40 MG tablet Take 1 tablet (40 mg total) by mouth daily. 30 tablet 3   No current facility-administered medications on file prior to visit.     ROS: Per HPI.  All other systems reviewed and are negative.   Physical Exam Filed Vitals:   07/11/14 1341  BP: 157/99  Pulse: 90  Temp: 98.7 F (37.1 C)   Repeat Pulse 110, manual, L radial artery   Physical Examination: General appearance - alert, well appearing, and in no distress Chest - clear to auscultation, no wheezes, rales or rhonchi, symmetric air entry Heart - tachycardic, regular rhythm.  No murmurs appreciated  Abdomen - soft, nontender, nondistended, no masses or organomegaly    Chemistry      Component Value Date/Time   NA 135 03/15/2014 1442   NA 136 07/15/2013 1021   K 3.8 03/15/2014 1442   K 3.5 07/15/2013 1021   CL 97 03/15/2014 1442   CL 98 07/14/2012 1400   CO2 27 03/15/2014 1442   CO2 25 07/15/2013 1021   BUN 7 03/15/2014 1442   BUN 11.0 07/15/2013 1021   CREATININE 0.70 03/15/2014 1442   CREATININE 1.1 07/15/2013 1021   CREATININE 1.30* 04/25/2012 0410      Component Value Date/Time   CALCIUM 9.8 03/15/2014 1442   CALCIUM 10.2 07/15/2013 1021   ALKPHOS 53 03/15/2014 1442   ALKPHOS 64 07/15/2013 1021   AST 44* 03/15/2014 1442   AST 56* 07/15/2013 1021   ALT 38* 03/15/2014 1442  ALT 51 07/15/2013 1021   BILITOT 0.6 03/15/2014 1442   BILITOT 1.04 07/15/2013 1021      Lab Results  Component Value Date   WBC 5.8 03/15/2014   HGB 13.3 03/15/2014   HCT 38.1 03/15/2014   MCV 90.3 03/15/2014   PLT 334 03/15/2014   No results found for: TSH Lab Results  Component Value Date   HGBA1C 5.7 02/21/2014

## 2014-07-11 NOTE — Telephone Encounter (Signed)
Has appt today @ 1:30pm

## 2014-07-11 NOTE — Assessment & Plan Note (Addendum)
High concern for UGI bleed in pt with melena and recent hematochezia, in a known alcoholic.  Obtain POC H&H, CBC, CMET, POC hemoccult. - H&H showing Hgb of 8.9, down from 13 3 months ago.  Will send for direct admission for w/u, IVF, IV protonix, possible GI  - She is tachycardic as well but hypertensive on exam in minimal distress  Discussed with Dr. Ree Kida who agrees with the plan.

## 2014-07-12 ENCOUNTER — Encounter (HOSPITAL_COMMUNITY)
Admission: AD | Disposition: A | Discharge status: Home / Self Care | Payer: Commercial Managed Care - HMO | Source: Ambulatory Visit | Attending: Family Medicine

## 2014-07-12 ENCOUNTER — Encounter (HOSPITAL_COMMUNITY): Payer: Self-pay | Admitting: *Deleted

## 2014-07-12 DIAGNOSIS — K2970 Gastritis, unspecified, without bleeding: Secondary | ICD-10-CM

## 2014-07-12 HISTORY — PX: ESOPHAGOGASTRODUODENOSCOPY: SHX5428

## 2014-07-12 LAB — BASIC METABOLIC PANEL
Anion gap: 8 (ref 5–15)
BUN: 25 mg/dL — AB (ref 6–23)
CALCIUM: 8.9 mg/dL (ref 8.4–10.5)
CHLORIDE: 103 mmol/L (ref 96–112)
CO2: 25 mmol/L (ref 19–32)
CREATININE: 0.94 mg/dL (ref 0.50–1.10)
GFR calc non Af Amer: 64 mL/min — ABNORMAL LOW (ref >90)
GFR, EST AFRICAN AMERICAN: 74 mL/min — AB (ref >90)
Glucose, Bld: 97 mg/dL (ref 70–99)
POTASSIUM: 3.1 mmol/L — AB (ref 3.5–5.1)
Sodium: 136 mmol/L (ref 135–145)

## 2014-07-12 LAB — CBC
HCT: 21.5 % — ABNORMAL LOW (ref 36.0–46.0)
HEMOGLOBIN: 7.5 g/dL — AB (ref 12.0–15.0)
MCH: 31.8 pg (ref 26.0–34.0)
MCHC: 34.9 g/dL (ref 30.0–36.0)
MCV: 91.1 fL (ref 78.0–100.0)
Platelets: 222 10*3/uL (ref 150–400)
RBC: 2.36 MIL/uL — ABNORMAL LOW (ref 3.87–5.11)
RDW: 13.4 % (ref 11.5–15.5)
WBC: 8 10*3/uL (ref 4.0–10.5)

## 2014-07-12 LAB — MAGNESIUM: Magnesium: 1.9 mg/dL (ref 1.5–2.5)

## 2014-07-12 LAB — VITAMIN B12: VITAMIN B 12: 552 pg/mL (ref 211–911)

## 2014-07-12 LAB — PHOSPHORUS: Phosphorus: 2.8 mg/dL (ref 2.3–4.6)

## 2014-07-12 SURGERY — EGD (ESOPHAGOGASTRODUODENOSCOPY)
Anesthesia: Moderate Sedation

## 2014-07-12 MED ORDER — DIPHENHYDRAMINE HCL 50 MG/ML IJ SOLN
INTRAMUSCULAR | Status: AC
  Filled 2014-07-12: qty 1

## 2014-07-12 MED ORDER — FENTANYL CITRATE 0.05 MG/ML IJ SOLN
INTRAMUSCULAR | Status: AC
  Filled 2014-07-12: qty 2

## 2014-07-12 MED ORDER — POTASSIUM CHLORIDE CRYS ER 20 MEQ PO TBCR
30.0000 meq | EXTENDED_RELEASE_TABLET | Freq: Two times a day (BID) | ORAL | Status: AC
  Administered 2014-07-12 (×2): 30 meq via ORAL
  Filled 2014-07-12 (×2): qty 1

## 2014-07-12 MED ORDER — ACETAMINOPHEN 325 MG PO TABS
650.0000 mg | ORAL_TABLET | Freq: Four times a day (QID) | ORAL | Status: DC | PRN
  Administered 2014-07-12: 650 mg via ORAL
  Filled 2014-07-12: qty 2

## 2014-07-12 MED ORDER — MIDAZOLAM HCL 10 MG/2ML IJ SOLN
INTRAMUSCULAR | Status: DC | PRN
  Administered 2014-07-12 (×2): 2 mg via INTRAVENOUS

## 2014-07-12 MED ORDER — BUTAMBEN-TETRACAINE-BENZOCAINE 2-2-14 % EX AERO
INHALATION_SPRAY | CUTANEOUS | Status: DC | PRN
  Administered 2014-07-12: 2 via TOPICAL

## 2014-07-12 MED ORDER — MIDAZOLAM HCL 5 MG/ML IJ SOLN
INTRAMUSCULAR | Status: AC
  Filled 2014-07-12: qty 2

## 2014-07-12 MED ORDER — FENTANYL CITRATE 0.05 MG/ML IJ SOLN
INTRAMUSCULAR | Status: DC | PRN
  Administered 2014-07-12 (×2): 25 ug via INTRAVENOUS

## 2014-07-12 NOTE — Op Note (Signed)
[facility] [place], [id number]   ENDOSCOPY PROCEDURE REPORT  PATIENT: Carrie, [name]  MR#: [id number] BIRTHDATE: 1950-11-27 , 63  yrs. old GENDER: female ENDOSCOPIST: Clarene Essex, MD REFERRED BY: PROCEDURE DATE:  14-Jul-2014 PROCEDURE:  EGD w/ biopsy ASA CLASS:     Class II INDICATIONS:  melena, hematemesis, and acute post hemorrhagic anemia. MEDICATIONS: Fentanyl 50 mcg IV and Versed 5 mg IV TOPICAL ANESTHETIC: Cetacaine Spray  DESCRIPTION OF PROCEDURE: After the risks benefits and alternatives of the procedure were thoroughly explained, informed consent was obtained.  The Pentax Gastroscope [id number] endoscope was introduced through the mouth and advanced to the second portion of the duodenum , Without limitations.  The instrument was slowly withdrawn as the mucosa was fully examined.    the findings are recorded below       Retroflexed views revealed a hiatal hernia.     The scope was then withdrawn from the patient and the procedure completed.  COMPLICATIONS: There were no immediate complications.  ENDOSCOPIC IMPRESSION: 1. Small hiatal hernia 2. Patch of proximal gastritis 3. Small peripyloric ulcer with flat white base 4. Mild bulbitis5. Otherwise within normal limits to the third part of the duodenum status post gastric biopsied to rule out H. pylori  RECOMMENDATIONS: slowly advance diet alcohol rehabilitation await pathology and treat H. pylori if positive avoid aspirin and nonsteroidals and follow-up in one month with my partner Dr. Michail Sermon to set up repeat colonoscopy for her history of polyps and consider repeat endoscopy to document healing and continue pump inhibitors for 3 months   REPEAT EXAM: as above 4 as needed  eSigned:  Clarene Essex, MD July 14, 2014 10:24 AM    CC:  CPT CODES: ICD CODES:  The ICD and CPT codes recommended by this software are interpretations from the data that the clinical staff has  captured with the software.  The verification of the translation of this report to the ICD and CPT codes and modifiers is the sole responsibility of the health care institution and practicing physician where this report was generated.  [employer] will not be held responsible for the validity of the ICD and CPT codes included on this report.  AMA assumes no liability for data contained or not contained herein. CPT is a Designer, television/film set of the [employer].  PATIENT NAME:  Carrie, [name] MR#: [id number]

## 2014-07-12 NOTE — Progress Notes (Signed)
Chaplain responded to spiritual care consult for pt requesting prayer. Chaplain offered prayer and emotional support. Pt expressed no other needs at this time. Chaplain informed pt of her services should she need them. Page chaplain as needed.    07/12/14 1500  Clinical Encounter Type  Visited With Patient and family together  Visit Type Initial;Spiritual support  Referral From Nurse  Spiritual Encounters  Spiritual Needs Emotional;Prayer  Stress Factors  Patient Stress Factors Major life changes  Carrie [name], Barbette Hair, Chaplain 07/12/2014 3:02 PM

## 2014-07-12 NOTE — Progress Notes (Signed)
Called for medication 2/2 headache. Patient states she has a "slight headache." No vision changes or lightheadedness.  No focal neurologic deficits  Tylenol PRN ordered

## 2014-07-12 NOTE — Progress Notes (Signed)
Feeling well. Denies further episodes of vomiting or blood in stool. Hemoglobin 7.9>7.5. Denies dizziness. Will recheck CBC in am and discharge if improved. Vitals stable.  Dr. Gerlean Ren

## 2014-07-12 NOTE — Progress Notes (Signed)
INITIAL NUTRITION ASSESSMENT  DOCUMENTATION CODES Per approved criteria  -Obesity Unspecified   INTERVENTION: -Continue Ensure Enlive po BID, each supplement provides 350 kcal and 20 grams of protein  NUTRITION DIAGNOSIS: Inadequate oral intake related to decreased oral intake as evidenced by diet hx.   Goal: Pt will meet >90% of estimated nutritional needs  Monitor:  PO/supplement intake, labs, weight changes, I/O's  Reason for Assessment: MST=2  64 y.o. female  Admitting Dx: UGI bleed  Carrie [name] is a 64 y.o. female directly admitted to [facility] 4/11 for presumed upper GI bleeding. PMH is significant for alcohol abuse, GERD, gastritis, HTN, and breast cancer s/p radiation.   ASSESSMENT: Pt admitted with UGI bleed and ETOH abuse. Pt reports that her appetite is chronically poor, revealing this has been an ongoing problem for the past 6 years. When probed further, pt reports typical intake for her is a pizza daily.She is currently tolerating soft diet well. Pt reports she was able to consume applesauce, fruit, and jello this morning without difficulty. Pt uncertain of weight loss. She reports UBW around 172#, but reports it varies. Noted UBE between 167-165# over the past year. She denies any changes in the way her clothing fits. Nutrition-focused physical exam revealed no signs of fat and muscle depletion. Pt reports she really enjoys the Ensure supplements and would like to continue with them. She is contemplating purchasing some when she goes home as well. RD encouraged this and educated pt on good intake of supplements and meals to promote healing.  Labs reviewed. K: 3.1, BUN: 25.   Height: Ht Readings from Last 1 Encounters:  07/12/14 5' (1.524 m)    Weight: Wt Readings from Last 1 Encounters:  07/12/14 162 lb (73.483 kg)    Ideal Body Weight: 100#  % Ideal Body Weight: 162%  Wt Readings from Last 10 Encounters:  07/12/14 162 lb (73.483 kg)  07/11/14 172 lb (78.019  kg)  06/14/14 172 lb (78.019 kg)  03/15/14 174 lb (78.926 kg)  02/21/14 167 lb (75.751 kg)  02/08/14 170 lb 3.2 oz (77.202 kg)  09/17/13 169 lb 14.4 oz (77.066 kg)  09/08/13 169 lb (76.658 kg)  08/20/13 170 lb 7 oz (77.31 kg)  08/04/13 175 lb (79.379 kg)    Usual Body Weight: 172#  % Usual Body Weight: 94%  BMI:  Body mass index is 31.64 kg/(m^2). Obesity, class I  Estimated Nutritional Needs: Kcal: 1800-2000 Protein: 80-90 grams Fluid: 1.8-2.0 L  Skin: Intact  Diet Order: DIET SOFT Room service appropriate?: Yes; Fluid consistency:: Thin  EDUCATION NEEDS: -Education needs addressed   Intake/Output Summary (Last 24 hours) at 07/12/14 1207 Last data filed at 07/12/14 1000  Gross per 24 hour  Intake 744.67 ml  Output    500 ml  Net 244.67 ml    Last BM: 07/11/14   Labs:   Recent Labs Lab 07/11/14 1900 07/12/14 0736  NA 129* 136  K 3.1* 3.1*  CL 93* 103  CO2 23 25  BUN 40* 25*  CREATININE 1.30* 0.94  CALCIUM 9.4 8.9  MG  --  1.9  PHOS  --  2.8  GLUCOSE 119* 97    CBG (last 3)  No results for input(s): GLUCAP in the last 72 hours.  Scheduled Meds: . folic acid  1 mg Intravenous Daily  . multivitamin with minerals  1 tablet Oral Daily  . pantoprazole (PROTONIX) IV  80 mg Intravenous Q12H  . pneumococcal 23 valent vaccine  0.5 mL  Intramuscular Tomorrow-1000  . sodium chloride  3 mL Intravenous Q12H  . thiamine  100 mg Intravenous Daily    Continuous Infusions:   Past Medical History  Diagnosis Date  . Hypokalemia 07/17/2011  . Hepatic steatosis 07/17/2011  . Breast cancer, right breast 05/2009  . Hypertension   . Arthritis     "back, right hand" (07/11/2014)  . Chronic lower back pain   . Lower GI bleeding 07/11/2014 hospitalized    Past Surgical History  Procedure Laterality Date  . Breast biopsy Right 2011  . Breast lumpectomy Right 2011  . Dilation and curettage of uterus  07/2009  . Mastectomy complete / simple w/ sentinel node biopsy   05/2009    Archie Endo 05/23/2009    Maycie Luera A. Jimmye Norman, RD, LDN, CDE Pager: [phone] After hours Pager: [phone]

## 2014-07-12 NOTE — Progress Notes (Signed)
Family Medicine Teaching Service Daily Progress Note Intern Pager: [phone]  Patient name: Carrie [name] Medical record number: [id number] Date of birth: 24-Nov-1950 Age: 64 y.o. Gender: female  Primary Care Provider: Kennith Maes, DO Consultants: Gastroenterology Code Status: Full  Assessment and Plan: 64 y.o. female directly admitted to [facility] 4/11 for presumed upper GI bleeding. PMH is significant for alcohol abuse, GERD, gastritis, HTN, and breast cancer s/p radiation.   # Upper GI bleed: Melena, hematemesis and anemia (hgb 8.9 from 13-14 baseline). Presumably due to PUD, gastritis, and possibly varices given EtOH history. Other possibilities include Mallory-Weiss tear as retching preceded hematemesis, and erosive (history of GERD) and radiation esophagitis (s/p radiation for invasive ductal CA in left breast in 2011). Had colonoscopy by Dr. Michail Sermon, [facility], 08/27/2012, with diverticulosis and internal hemorrhoids.  - CBC pending - [facility] consulted. Appreciate recommendations.  - slowly advance diet, EtOH rehab, f/u H. Pylori. Avoid ASA and NSAIDs, f/u in one month with Dr. Michail Sermon, PPI x18mo - EGD- small hiatal hernia, proximal gastritis, small peripyloric ulcer, mild bulbitis - NPO   # Alcohol abuse: Since age of 74, recently diminished intake. Never attempted to quit before, never DTs. Has appointment with [facility] to discuss entering recovery program. No signs of withdrawal on exam.  - AST 28, ALT 24 - CIWA: 0-1 - Banana bag now, supplement folate, thiamine daily  # Acute blood loss anemia: Hemoglobin 9.5 at admission, Baseline 12-14, noted to be 8.9 at office visit - Hemoglobin 7.9--recheck at 2pm, if >8 consider discharge - Likely threshold for transfusion is hgb 7.0 mg/dl  # Hypokalemia- potassium 3.1 at admission - Potassium 3.1 - Replete  # Acute Kidney Injury: Creatinine 1.30 at admission. Baseline 0.7-0.9 - Creatinine 0.94 today - Continue to monitor  # HTN: Hold  anti-HTN and add back as indicated - Normotensive this morning, 118/71 - RN to call with BP > 160/100 for prn order - Consider echo given alcoholic history to r/o cardiomyopathy or if dyspnea on exertion is not also improve with correction of anemia.   # History of breast cancer: - Continue anastrazole once po  FEN/GI: Banana bag @125cc /hr, NPO Prophylaxis: SCDs  Disposition: Admit   Subjective:  Feels well today. Denies any further episodes of vomiting. No bowel movements today. No further complaints. Anxious to go home.  Objective: Temp:  [98.2 F (36.8 C)-98.7 F (37.1 C)] 98.3 F (36.8 C) (04/12 0536) Pulse Rate:  [90-105] 105 (04/11 2133) Resp:  [16-20] 20 (04/12 0536) BP: (118-157)/(71-99) 118/71 mmHg (04/12 0536) SpO2:  [100 %] 100 % (04/12 0536) Weight:  [162 lb 14.7 oz (73.9 kg)-172 lb (78.019 kg)] 162 lb 14.7 oz (73.9 kg) (04/11 1621) Physical Exam: General: 64yo female resting comfortably in no apparent distress Cardiovascular: S1 and S2 noted. No murmurs/rubs/gallops Respiratory: Clear to auscultation bilaterally. No wheezes noted. No increased work of breathing Abdomen: Soft and nondistended. Bowel sounds noted. Nontender to palpation. Extremities: No edema noted. SCDs in place.  Laboratory:  Recent Labs Lab 07/11/14 1355 07/11/14 1900  WBC  --  10.2  HGB 8.9* 9.5*  HCT  --  26.8*  PLT  --  302    Recent Labs Lab 07/11/14 1900  NA 129*  K 3.1*  CL 93*  CO2 23  BUN 40*  CREATININE 1.30*  CALCIUM 9.4  PROT 7.6  BILITOT 0.7  ALKPHOS 42  ALT 24  AST 28  GLUCOSE 119*  - Vitamin B12 552  Imaging/Diagnostic Tests:  No results found.  [place], [place] 07/12/2014, 8:37 AM PGY-1, [place] Intern pager: [phone], text pages welcome

## 2014-07-12 NOTE — Progress Notes (Signed)
Utilization review completed.  

## 2014-07-12 NOTE — Consult Note (Signed)
Reason for Consult: GI bleeding Referring Physician: Hospital team  Carrie [name] is an 64 y.o. female.  HPI: Carrie [name] known to my partner Dr. Michail Sermon who has been drinking probably too much by her own accord and had some increased abdominal pain nausea vomiting with some minimal hematemesis and black stools and was found to be significantly anemic and we were consulted for further workup and plans and she denies any aspirin or nonsteroidals and her office computer chart with her previous 2011 colonoscopy and endoscopy was reviewed and her family history is negative for any GI problems and she has no other complaints  Past Medical History  Diagnosis Date  . Hypokalemia 07/17/2011  . Hepatic steatosis 07/17/2011  . Breast cancer, right breast 05/2009  . Hypertension   . Arthritis     "back, right hand" (07/11/2014)  . Chronic lower back pain   . Lower GI bleeding 07/11/2014 hospitalized    Past Surgical History  Procedure Laterality Date  . Breast biopsy Right 2011  . Breast lumpectomy Right 2011  . Dilation and curettage of uterus  07/2009  . Mastectomy complete / simple w/ sentinel node biopsy  05/2009    [facility] 05/23/2009    Family History  Problem Relation Age of Onset  . Cancer Other   . Diabetes Mother   . Diabetes Brother   . Hypertension Mother   . Hypertension Father     Social History:  reports that she has never smoked. She has never used smokeless tobacco. She reports that she drinks about 12.6 oz of alcohol per week. She reports that she does not use illicit drugs.  Allergies: No Known Allergies  Medications: I have reviewed the Carrie [name]'s current medications.  Results for orders placed or performed during the hospital encounter of 07/11/14 (from the past 48 hour(s))  Protime-INR     Status: None   Collection Time: 07/11/14  7:00 PM  Result Value Ref Range   Prothrombin Time 13.4 11.6 - 15.2 seconds   INR 1.01 0.00 - 1.49  CBC WITH DIFFERENTIAL     Status: Abnormal   Collection Time: 07/11/14  7:00 PM  Result Value Ref Range   WBC 10.2 4.0 - 10.5 K/uL   RBC 3.00 (L) 3.87 - 5.11 MIL/uL   Hemoglobin 9.5 (L) 12.0 - 15.0 g/dL   HCT 26.8 (L) 36.0 - 46.0 %   MCV 89.3 78.0 - 100.0 fL   MCH 31.7 26.0 - 34.0 pg   MCHC 35.4 30.0 - 36.0 g/dL   RDW 13.1 11.5 - 15.5 %   Platelets 302 150 - 400 K/uL   Neutrophils Relative % 64 43 - 77 %   Neutro Abs 6.5 1.7 - 7.7 K/uL   Lymphocytes Relative 26 12 - 46 %   Lymphs Abs 2.6 0.7 - 4.0 K/uL   Monocytes Relative 10 3 - 12 %   Monocytes Absolute 1.0 0.1 - 1.0 K/uL   Eosinophils Relative 0 0 - 5 %   Eosinophils Absolute 0.0 0.0 - 0.7 K/uL   Basophils Relative 0 0 - 1 %   Basophils Absolute 0.0 0.0 - 0.1 K/uL  APTT     Status: None   Collection Time: 07/11/14  7:00 PM  Result Value Ref Range   aPTT 27 24 - 37 seconds  Reticulocytes     Status: Abnormal   Collection Time: 07/11/14  7:00 PM  Result Value Ref Range   Retic Ct Pct 3.4 (H) 0.4 - 3.1 %  RBC. 3.00 (L) 3.87 - 5.11 MIL/uL   Retic Count, Manual 102.0 19.0 - 186.0 K/uL  Comprehensive metabolic panel     Status: Abnormal   Collection Time: 07/11/14  7:00 PM  Result Value Ref Range   Sodium 129 (L) 135 - 145 mmol/L   Potassium 3.1 (L) 3.5 - 5.1 mmol/L   Chloride 93 (L) 96 - 112 mmol/L   CO2 23 19 - 32 mmol/L   Glucose, Bld 119 (H) 70 - 99 mg/dL   BUN 40 (H) 6 - 23 mg/dL   Creatinine, Ser 1.30 (H) 0.50 - 1.10 mg/dL   Calcium 9.4 8.4 - 10.5 mg/dL   Total Protein 7.6 6.0 - 8.3 g/dL   Albumin 4.0 3.5 - 5.2 g/dL   AST 28 0 - 37 U/L   ALT 24 0 - 35 U/L   Alkaline Phosphatase 42 39 - 117 U/L   Total Bilirubin 0.7 0.3 - 1.2 mg/dL   GFR calc non Af Amer 43 (L) >90 mL/min   GFR calc Af Amer 50 (L) >90 mL/min    Comment: (NOTE) The eGFR has been calculated using the CKD EPI equation. This calculation has not been validated in all clinical situations. eGFR's persistently <90 mL/min signify possible Chronic Kidney Disease.    Anion gap 13 5 - 15  Type  and screen     Status: None   Collection Time: 07/11/14  7:00 PM  Result Value Ref Range   ABO/RH(D) O POS    Antibody Screen NEG    Sample Expiration 07/14/2014   ABO/Rh     Status: None   Collection Time: 07/11/14  7:00 PM  Result Value Ref Range   ABO/RH(D) O POS   CBC     Status: Abnormal   Collection Time: 07/12/14  7:36 AM  Result Value Ref Range   WBC 6.6 4.0 - 10.5 K/uL   RBC 2.47 (L) 3.87 - 5.11 MIL/uL   Hemoglobin 7.9 (L) 12.0 - 15.0 g/dL   HCT 22.4 (L) 36.0 - 46.0 %   MCV 90.7 78.0 - 100.0 fL   MCH 32.0 26.0 - 34.0 pg   MCHC 35.3 30.0 - 36.0 g/dL   RDW 13.2 11.5 - 15.5 %   Platelets 237 150 - 400 K/uL  Basic metabolic panel     Status: Abnormal   Collection Time: 07/12/14  7:36 AM  Result Value Ref Range   Sodium 136 135 - 145 mmol/L   Potassium 3.1 (L) 3.5 - 5.1 mmol/L   Chloride 103 96 - 112 mmol/L   CO2 25 19 - 32 mmol/L   Glucose, Bld 97 70 - 99 mg/dL   BUN 25 (H) 6 - 23 mg/dL   Creatinine, Ser 0.94 0.50 - 1.10 mg/dL   Calcium 8.9 8.4 - 10.5 mg/dL   GFR calc non Af Amer 64 (L) >90 mL/min   GFR calc Af Amer 74 (L) >90 mL/min    Comment: (NOTE) The eGFR has been calculated using the CKD EPI equation. This calculation has not been validated in all clinical situations. eGFR's persistently <90 mL/min signify possible Chronic Kidney Disease.    Anion gap 8 5 - 15  Magnesium     Status: None   Collection Time: 07/12/14  7:36 AM  Result Value Ref Range   Magnesium 1.9 1.5 - 2.5 mg/dL  Phosphorus     Status: None   Collection Time: 07/12/14  7:36 AM  Result Value Ref Range   Phosphorus  2.8 2.3 - 4.6 mg/dL    No results found.  ROS negative except above Blood pressure 131/78, pulse 105, temperature 98 F (36.7 C), temperature source Oral, resp. rate 16, height 5' (1.524 m), weight 73.483 kg (162 lb), SpO2 100 %. Physical Exam Vital signs stable afebrile no acute distress exam please see preassessment evaluation labs reviewed Assessment/Plan: Upper GI  bleeding in a Carrie [name] with some alcohol abuse Plan: The risks benefits methods of endoscopy was discussed and will proceed this morning with further workup and plans pending those findings  Dasani Crear E 07/12/2014, 9:59 AM

## 2014-07-13 ENCOUNTER — Encounter (HOSPITAL_COMMUNITY): Payer: Self-pay | Admitting: Gastroenterology

## 2014-07-13 ENCOUNTER — Encounter: Payer: Self-pay | Admitting: Gastroenterology

## 2014-07-13 DIAGNOSIS — K219 Gastro-esophageal reflux disease without esophagitis: Secondary | ICD-10-CM | POA: Diagnosis not present

## 2014-07-13 DIAGNOSIS — I10 Essential (primary) hypertension: Secondary | ICD-10-CM | POA: Diagnosis not present

## 2014-07-13 DIAGNOSIS — N179 Acute kidney failure, unspecified: Secondary | ICD-10-CM | POA: Diagnosis not present

## 2014-07-13 DIAGNOSIS — K921 Melena: Secondary | ICD-10-CM | POA: Diagnosis not present

## 2014-07-13 DIAGNOSIS — K279 Peptic ulcer, site unspecified, unspecified as acute or chronic, without hemorrhage or perforation: Secondary | ICD-10-CM | POA: Diagnosis not present

## 2014-07-13 DIAGNOSIS — E559 Vitamin D deficiency, unspecified: Secondary | ICD-10-CM | POA: Diagnosis not present

## 2014-07-13 DIAGNOSIS — D62 Acute posthemorrhagic anemia: Secondary | ICD-10-CM | POA: Diagnosis not present

## 2014-07-13 DIAGNOSIS — K920 Hematemesis: Secondary | ICD-10-CM | POA: Diagnosis not present

## 2014-07-13 DIAGNOSIS — F1010 Alcohol abuse, uncomplicated: Secondary | ICD-10-CM | POA: Diagnosis not present

## 2014-07-13 LAB — BASIC METABOLIC PANEL
ANION GAP: 6 (ref 5–15)
BUN: 10 mg/dL (ref 6–23)
CHLORIDE: 103 mmol/L (ref 96–112)
CO2: 26 mmol/L (ref 19–32)
Calcium: 8.8 mg/dL (ref 8.4–10.5)
Creatinine, Ser: 0.92 mg/dL (ref 0.50–1.10)
GFR calc non Af Amer: 66 mL/min — ABNORMAL LOW (ref >90)
GFR, EST AFRICAN AMERICAN: 76 mL/min — AB (ref >90)
Glucose, Bld: 97 mg/dL (ref 70–99)
Potassium: 3.4 mmol/L — ABNORMAL LOW (ref 3.5–5.1)
Sodium: 135 mmol/L (ref 135–145)

## 2014-07-13 LAB — CBC
HCT: 22.4 % — ABNORMAL LOW (ref 36.0–46.0)
HCT: 21.8 % — ABNORMAL LOW (ref 36.0–46.0)
Hemoglobin: 7.9 g/dL — ABNORMAL LOW (ref 12.0–15.0)
Hemoglobin: 7.6 g/dL — ABNORMAL LOW (ref 12.0–15.0)
MCH: 32 pg (ref 26.0–34.0)
MCH: 32.3 pg (ref 26.0–34.0)
MCHC: 35.3 g/dL (ref 30.0–36.0)
MCHC: 34.9 g/dL (ref 30.0–36.0)
MCV: 90.7 fL (ref 78.0–100.0)
MCV: 92.8 fL (ref 78.0–100.0)
PLATELETS: 237 10*3/uL (ref 150–400)
PLATELETS: 256 10*3/uL (ref 150–400)
RBC: 2.47 MIL/uL — AB (ref 3.87–5.11)
RBC: 2.35 MIL/uL — ABNORMAL LOW (ref 3.87–5.11)
RDW: 13.2 % (ref 11.5–15.5)
RDW: 13.5 % (ref 11.5–15.5)
WBC: 6.6 10*3/uL (ref 4.0–10.5)
WBC: 5.9 10*3/uL (ref 4.0–10.5)

## 2014-07-13 MED ORDER — FOLIC ACID 1 MG PO TABS
1.0000 mg | ORAL_TABLET | Freq: Every day | ORAL | Status: DC
  Administered 2014-07-13: 1 mg via ORAL
  Filled 2014-07-13: qty 1

## 2014-07-13 MED ORDER — VITAMIN B-1 100 MG PO TABS
100.0000 mg | ORAL_TABLET | Freq: Every day | ORAL | Status: DC
  Administered 2014-07-13: 100 mg via ORAL
  Filled 2014-07-13: qty 1

## 2014-07-13 MED ORDER — AMLODIPINE BESYLATE 5 MG PO TABS
5.0000 mg | ORAL_TABLET | Freq: Every day | ORAL | Status: DC
  Administered 2014-07-13: 5 mg via ORAL
  Filled 2014-07-13: qty 1

## 2014-07-13 NOTE — Clinical Documentation Improvement (Signed)
Please specify diagnosis related to below supporting information, if appropriate.   Possible Clinical Conditions?   Hyponatremia                                Other Condition___________________                 Cannot Clinically Determine_________   Supporting Information:  Patient's labs during this admission  Component     Latest Ref Rng 07/11/2014 07/12/2014         7:00 PM  7:36 AM  Sodium     135 - 145 mmol/L 129 (L) 136   Component     Latest Ref Rng 07/13/2014          Sodium     135 - 145 mmol/L 135   Treatment= received IVFs.   Thank You, Serena Colonel ,RN Clinical Documentation Specialist:  [facility] Information [facility]

## 2014-07-13 NOTE — Progress Notes (Signed)
Pt discharging via wheelchair, escorted by volunteer, meeting ride downstairs. Discharge instructions provided to patient, verbalizes understanding and able to provide appropriate teach back. PIV discontinued, site without s/s of complication. Tele box removed (5w26) and returned to unit. Denies needs or questions at this time.

## 2014-07-13 NOTE — Progress Notes (Signed)
Family Medicine Teaching Service Daily Progress Note Intern Pager: [phone]  Patient name: Carrie [name] Medical record number: [id number] Date of birth: 1950/10/20 Age: 64 y.o. Gender: female  Primary Care Provider: Kennith Maes, DO Consultants: Gastroenterology Code Status: Full  Assessment and Plan: 64 y.o. female directly admitted to [facility] 4/11 for presumed upper GI bleeding. PMH is significant for alcohol abuse, GERD, gastritis, HTN, and breast cancer s/p radiation.   # Upper GI bleed: Melena, hematemesis and anemia (hgb 8.9 from 13-14 baseline). Presumably due to PUD, gastritis, and possibly varices given EtOH history. Other possibilities include Mallory-Weiss tear as retching preceded hematemesis, and erosive (history of GERD) and radiation esophagitis (s/p radiation for invasive ductal CA in left breast in 2011). Had colonoscopy by Dr. Michail Sermon, [facility], 08/27/2012, with diverticulosis and internal hemorrhoids.  - Hemoglobin 7.9 >7.5 >7.6--Stable and asymptomatic, will discharge today - [facility] consulted. Appreciate recommendations.  - slowly advance diet, EtOH rehab, f/u H. Pylori. Avoid ASA and NSAIDs, f/u in one month with Dr. Michail Sermon, PPI x79mo - EGD- small hiatal hernia, proximal gastritis, small peripyloric ulcer, mild bulbitis - NPO   # Alcohol abuse: Since age of 52, recently diminished intake. Never attempted to quit before, never DTs. Has appointment with [facility] to discuss entering recovery program. No signs of withdrawal on exam.  - AST 28, ALT 24 - CIWA: 0-1 - Banana bag now, supplement folate, thiamine daily  # Acute blood loss anemia: Hemoglobin 9.5 at admission, Baseline 12-14, noted to be 8.9 at office visit - Hemoglobin 7.9 >7.5 >7.6 - Likely threshold for transfusion is hgb 7.0 mg/dl  # Hypokalemia- potassium 3.1 at admission - Potassium 3.4 - Replete  # HTN: Hold anti-HTN and add back as indicated - Normotensive this morning, 132/77 - Restart  Amlodipine 5mg   FEN/GI: Banana bag @125cc /hr, NPO Prophylaxis: SCDs  Disposition: Discharge Today  Subjective:  Feels well today. Denies dizziness. Denies vomiting. Stool unchanged. Anxious to be discharged home.  Objective: Temp:  [98 F (36.7 C)-98.5 F (36.9 C)] 98.2 F (36.8 C) (04/13 0631) Pulse Rate:  [74-96] 74 (04/13 0631) Resp:  [13-23] 18 (04/13 0631) BP: (111-167)/(62-93) 132/77 mmHg (04/13 0631) SpO2:  [90 %-100 %] 100 % (04/13 0631) Weight:  [162 lb (73.483 kg)] 162 lb (73.483 kg) (04/12 0912) Physical Exam: General: 64yo female resting comfortably in no apparent distress Cardiovascular: S1 and S2 noted. No murmurs/rubs/gallops Respiratory: Clear to auscultation bilaterally. No wheezes noted. No increased work of breathing Abdomen: Soft and nondistended. Bowel sounds noted. Nontender to palpation. Extremities: No edema noted. SCDs in place.  Laboratory:  Recent Labs Lab 07/12/14 0736 07/12/14 1515 07/13/14 0558  WBC 6.6 8.0 5.9  HGB 7.9* 7.5* 7.6*  HCT 22.4* 21.5* 21.8*  PLT 237 222 256    Recent Labs Lab 07/11/14 1900 07/12/14 0736 07/13/14 0558  NA 129* 136 135  K 3.1* 3.1* 3.4*  CL 93* 103 103  CO2 23 25 26   BUN 40* 25* 10  CREATININE 1.30* 0.94 0.92  CALCIUM 9.4 8.9 8.8  PROT 7.6  --   --   BILITOT 0.7  --   --   ALKPHOS 42  --   --   ALT 24  --   --   AST 28  --   --   GLUCOSE 119* 97 97  - Vitamin B12 552  Imaging/Diagnostic Tests: No results found.  [place], [place] 07/13/2014, 8:48 AM PGY-1, [facility] Intern pager:  [phone], text pages welcome

## 2014-07-13 NOTE — Discharge Instructions (Signed)

## 2014-07-13 NOTE — Progress Notes (Signed)
Patient ID: Carrie [name], female   DOB: 1950/11/28, 64 y.o.   MRN: [id number] Patient seen and examined before discharge and her endoscopy results were discussed and she'll call me in 1 week for the biopsies and will stop drinking which was extensively discussed and no aspirin or nonsteroidals long-term and she'll follow-up with either me or my partner Dr. Michail Sermon to discuss repeat endoscopy in 2 months to document healing and probably another colonoscopy which she is due for for her history of polyps as well

## 2014-07-16 NOTE — Discharge Summary (Signed)
[facility] Discharge Summary  Patient name: Carrie [name] Medical record number: [id number] Date of birth: 13-Oct-1950 Age: 64 y.o. Gender: female Date of Admission: 07/11/2014  Date of Discharge: 07/13/14 Admitting Physician: Dickie La, MD  Primary Care Provider: Kennith Maes, DO Consultants: Gastroenterology  Indication for Hospitalization: GI Bleed  Discharge Diagnoses/Problem List:  Upper GI Bleed Alcohol Abuse HTN Hypokalemia Acute Kidney Injury  Disposition: Discharge Home  Discharge Condition: Stable  Discharge Exam: Please refer to Progress Note from 07/13/14  Brief Hospital Course:  Carrie [name] initially presented to the [facility] on 4/11 and reported having black tarry stools and emesis with frank blood for five days. Also reported feeling fatigued, dizzy, and nausea. Has history of colonoscopy 2014 with diverticulosis and internal hemorrhoids. Directly admitted to [facility] Medicine Teaching Service.  Carrie [name] was made NPO at admission. Initial lab workup showed hemoglobin 8.9, down from baseline 13-14. Creatinine of 1.30 was also noted at admission, with baseline creatinine 0.7-0.9. Gastroenterology consulted on 4/11.  Endoscopy showed small hiatal hernia, proximal gastritis, small peripyloric ulcer, and mild bulbitis. GI recommended slowly advancing diet, avoidance of aspirin and NSAIDs, PPI x62months, and follow up in one month. Hemoglobin dropped from 7.9 to 7.5 on 4/12, so Carrie [name] was kept an additional night to ensure hemoglobin was stable. Hemoglobin on 4/13 stable at 7.6.  Of noted, Carrie [name] also has past medical history of alcohol abuse, stating she has been drinking since she was 64 years old. On average drinks 1L of brandy per day, recently switching from Vodka. For the five days prior to hospitalization, she was only drinking 2-3 beers per day. She has never tried to quit drinking alcohol, however she states she has recently  decided that she would like to go to rehab. Placed on CIWA protocol during hospitalization. AST 28 and ALT 24.  Hypokalemia was noted on 4/12 with potassium of 3.1. This was repleted with Kdur. Creatinine improved with fluids, decreasing to 0.94 prior to discharge.  Carrie [name] was discontinued on 4/13 with stabilization of hemoglobin and resolution of emesis.   Issues for Follow Up:  1. Follow up CBC. Hemoglobin stable at 7.6 at discharge 2. Interested in [place]. Continue to council on alcohol cessation. 3. Follow up GI Bleed. Continued to have dark stools at discharge. Following up with GI in 1 month  Significant Procedures: Endoscopy  Significant Labs and Imaging:   Recent Labs Lab 07/12/14 0736 07/12/14 1515 07/13/14 0558  WBC 6.6 8.0 5.9  HGB 7.9* 7.5* 7.6*  HCT 22.4* 21.5* 21.8*  PLT 237 222 256    Recent Labs Lab 07/11/14 1900 07/12/14 0736 07/13/14 0558  NA 129* 136 135  K 3.1* 3.1* 3.4*  CL 93* 103 103  CO2 23 25 26   GLUCOSE 119* 97 97  BUN 40* 25* 10  CREATININE 1.30* 0.94 0.92  CALCIUM 9.4 8.9 8.8  MG  --  1.9  --   PHOS  --  2.8  --   ALKPHOS 42  --   --   AST 28  --   --   ALT 24  --   --   ALBUMIN 4.0  --   --   - Vitamin B12 552 - PT 13.4 - INR 1.01  Results/Tests Pending at Time of Discharge: None  Discharge Medications:    Medication List    TAKE these medications        amLODipine 2.5 MG tablet  Commonly known as:  NORVASC  Take 2 tablets (5 mg total) by mouth daily.     anastrozole 1 MG tablet  Commonly known as:  ARIMIDEX  Take 1 tablet (1 mg total) by mouth daily.     cholecalciferol 1000 UNITS tablet  Commonly known as:  VITAMIN D  Take 1,000 Units by mouth daily.     Cholecalciferol 50000 UNITS capsule  Take 1 capsule (50,000 Units total) by mouth once a week.     DULoxetine 30 MG capsule  Commonly known as:  CYMBALTA  Take 1 capsule (30 mg total) by mouth daily.     pantoprazole 40 MG tablet  Commonly known as:   PROTONIX  Take 1 tablet (40 mg total) by mouth daily.        Discharge Instructions: Please refer to Patient Instructions section of EMR for full details.  Patient was counseled important signs and symptoms that should prompt return to medical care, changes in medications, dietary instructions, activity restrictions, and follow up appointments.   Follow-Up Appointments:     Follow-up Information    Follow up with Lear Ng., MD On 08/17/2014.   Specialty:  Gastroenterology   Why:  @10 :15am for Hospital Follow Up (Gastroenterology)   Contact information:   [address] [place] [address] [phone]       Follow up with Dorcas Mcmurray, MD On 07/20/2014.   Specialties:  Family Medicine, Sports Medicine   Why:  @10 :15am for Hospital Follow Up [facility] Medicine), will recheck hemoglobin and make GI referral so Dr. Michail Sermon can see on 5/18   Contact information:   [address] [address] [place], DO 07/16/2014, 10:37 AM PGY-1, [place]

## 2014-07-20 ENCOUNTER — Ambulatory Visit (INDEPENDENT_AMBULATORY_CARE_PROVIDER_SITE_OTHER): Payer: Commercial Managed Care - HMO | Admitting: Family Medicine

## 2014-07-20 ENCOUNTER — Encounter: Payer: Self-pay | Admitting: Family Medicine

## 2014-07-20 VITALS — BP 137/87 | HR 89 | Temp 98.3°F | Ht 60.0 in | Wt 173.0 lb

## 2014-07-20 DIAGNOSIS — M545 Low back pain: Secondary | ICD-10-CM | POA: Diagnosis not present

## 2014-07-20 DIAGNOSIS — K922 Gastrointestinal hemorrhage, unspecified: Secondary | ICD-10-CM | POA: Diagnosis not present

## 2014-07-20 LAB — CBC WITH DIFFERENTIAL/PLATELET
BASOS ABS: 0.1 10*3/uL (ref 0.0–0.1)
Basophils Relative: 1 % (ref 0–1)
Eosinophils Absolute: 0.1 10*3/uL (ref 0.0–0.7)
Eosinophils Relative: 2 % (ref 0–5)
HCT: 24.3 % — ABNORMAL LOW (ref 36.0–46.0)
HEMOGLOBIN: 8.3 g/dL — AB (ref 12.0–15.0)
LYMPHS PCT: 33 % (ref 12–46)
Lymphs Abs: 2.1 10*3/uL (ref 0.7–4.0)
MCH: 31 pg (ref 26.0–34.0)
MCHC: 34.2 g/dL (ref 30.0–36.0)
MCV: 90.7 fL (ref 78.0–100.0)
MONO ABS: 0.6 10*3/uL (ref 0.1–1.0)
MPV: 12.2 fL (ref 8.6–12.4)
Monocytes Relative: 10 % (ref 3–12)
NEUTROS ABS: 3.5 10*3/uL (ref 1.7–7.7)
Neutrophils Relative %: 54 % (ref 43–77)
PLATELETS: 559 10*3/uL — AB (ref 150–400)
RBC: 2.68 MIL/uL — ABNORMAL LOW (ref 3.87–5.11)
RDW: 14.7 % (ref 11.5–15.5)
WBC: 6.4 10*3/uL (ref 4.0–10.5)

## 2014-07-20 NOTE — Assessment & Plan Note (Signed)
She reports her PCP had schedule her for some x-rays but she did not go for those. Cannot remember where she was supposed to go and disorders are still good. It seems orders are still in the system so I gave her information on how to go get these films done and follow-up her back pain with her PCP

## 2014-07-20 NOTE — Progress Notes (Signed)
   Subjective:    Patient ID: Carrie [name], female    DOB: Aug 18, 1950, 64 y.o.   MRN: [id number]  HPI  Patient is here for hospital follow-up. Was hospitalized for upper GI bleed. On the hospital she had an endoscopy. Since coming home she has stopped drinking all alcohol. Says she's never tried to stop before but now she has not had any alcohol since the day that she was hospitalized. She's had no withdrawal symptoms. She feels she can do this on her own and does not want to "pay money" to anybody help her. She feels pretty good, about her decision and her success so far.  She's had no abdominal pain. Has noted no blood in her stool, no dark tarry stools. Appetite is been normal. Review of Systems See history of present illness. Additionally she reports no chest pain, no shortness of breath.    Objective:   Physical Exam  Vital signs reviewed. GENERAL: Well-developed, well-nourished, no acute distress. CARDIOVASCULAR: Regular rate and rhythm no murmur gallop or rub LUNGS: Clear to auscultation bilaterally, no rales or wheeze. ABDOMEN: Soft positive bowel sounds NEURO: No gross focal neurological deficits. MSK: Movement of extremity x 4.        Assessment & Plan:

## 2014-07-20 NOTE — Assessment & Plan Note (Signed)
Will repeat CBC today to check stability of her hemoglobin. I will have her follow-up with her PCP as previously scheduled.

## 2014-07-27 ENCOUNTER — Encounter: Payer: Self-pay | Admitting: Family Medicine

## 2014-08-01 ENCOUNTER — Other Ambulatory Visit: Payer: Self-pay

## 2014-08-01 DIAGNOSIS — C50912 Malignant neoplasm of unspecified site of left female breast: Secondary | ICD-10-CM

## 2014-08-02 ENCOUNTER — Other Ambulatory Visit: Payer: Self-pay

## 2014-08-02 ENCOUNTER — Other Ambulatory Visit (HOSPITAL_BASED_OUTPATIENT_CLINIC_OR_DEPARTMENT_OTHER): Payer: Commercial Managed Care - HMO

## 2014-08-02 DIAGNOSIS — C50912 Malignant neoplasm of unspecified site of left female breast: Secondary | ICD-10-CM

## 2014-08-02 DIAGNOSIS — C50119 Malignant neoplasm of central portion of unspecified female breast: Secondary | ICD-10-CM

## 2014-08-02 LAB — CBC WITH DIFFERENTIAL/PLATELET
BASO%: 0.4 % (ref 0.0–2.0)
BASOS ABS: 0 10*3/uL (ref 0.0–0.1)
EOS%: 2.6 % (ref 0.0–7.0)
Eosinophils Absolute: 0.2 10*3/uL (ref 0.0–0.5)
HCT: 28.3 % — ABNORMAL LOW (ref 34.8–46.6)
HEMOGLOBIN: 9.2 g/dL — AB (ref 11.6–15.9)
LYMPH#: 2.8 10*3/uL (ref 0.9–3.3)
LYMPH%: 40.6 % (ref 14.0–49.7)
MCH: 28.8 pg (ref 25.1–34.0)
MCHC: 32.5 g/dL (ref 31.5–36.0)
MCV: 88.7 fL (ref 79.5–101.0)
MONO#: 0.9 10*3/uL (ref 0.1–0.9)
MONO%: 12.8 % (ref 0.0–14.0)
NEUT#: 3 10*3/uL (ref 1.5–6.5)
NEUT%: 43.6 % (ref 38.4–76.8)
PLATELETS: 348 10*3/uL (ref 145–400)
RBC: 3.19 10*6/uL — ABNORMAL LOW (ref 3.70–5.45)
RDW: 15.2 % — ABNORMAL HIGH (ref 11.2–14.5)
WBC: 6.9 10*3/uL (ref 3.9–10.3)

## 2014-08-02 LAB — COMPREHENSIVE METABOLIC PANEL (CC13)
ALT: 12 U/L (ref 0–55)
ANION GAP: 10 meq/L (ref 3–11)
AST: 13 U/L (ref 5–34)
Albumin: 3.2 g/dL — ABNORMAL LOW (ref 3.5–5.0)
Alkaline Phosphatase: 70 U/L (ref 40–150)
BUN: 6.4 mg/dL — ABNORMAL LOW (ref 7.0–26.0)
CHLORIDE: 103 meq/L (ref 98–109)
CO2: 28 meq/L (ref 22–29)
Calcium: 9.1 mg/dL (ref 8.4–10.4)
Creatinine: 0.8 mg/dL (ref 0.6–1.1)
EGFR: >90 mL/min/{1.73_m2} (ref >90)
Glucose: 94 mg/dl (ref 70–140)
Potassium: 3.4 mEq/L — ABNORMAL LOW (ref 3.5–5.1)
Sodium: 140 mEq/L (ref 136–145)
TOTAL PROTEIN: 6.6 g/dL (ref 6.4–8.3)
Total Bilirubin: 0.25 mg/dL (ref 0.20–1.20)

## 2014-08-08 ENCOUNTER — Other Ambulatory Visit: Payer: Self-pay | Admitting: Oncology

## 2014-08-08 DIAGNOSIS — Z1231 Encounter for screening mammogram for malignant neoplasm of breast: Secondary | ICD-10-CM

## 2014-08-09 ENCOUNTER — Ambulatory Visit (HOSPITAL_BASED_OUTPATIENT_CLINIC_OR_DEPARTMENT_OTHER): Payer: Commercial Managed Care - HMO | Admitting: Oncology

## 2014-08-09 VITALS — BP 142/76 | HR 91 | Resp 18 | Ht 60.0 in | Wt 174.6 lb

## 2014-08-09 DIAGNOSIS — Z853 Personal history of malignant neoplasm of breast: Secondary | ICD-10-CM | POA: Diagnosis not present

## 2014-08-09 DIAGNOSIS — C50912 Malignant neoplasm of unspecified site of left female breast: Secondary | ICD-10-CM

## 2014-08-09 NOTE — Progress Notes (Signed)
ID: REA KALAMA   DOB: 09-Mar-1951  MR#: [id number]  WUJ#:[id number]  PCP: Kennith Maes, DO GYN: Janie Morning SU: Fanny Skates OTHER MD: Wilford Corner  CHIEF COMPLAINT:  Hx of Right Breast Cancer   CURRENT TREATMENT: Completing 5 years of anastrozole    HISTORY OF PRESENT ILLNESS:  from the original intake note:  Ms Custodio herself palpated a mass in her right breast.  She brought it to the attention of Dr Rexford Maus and he set her up for diagnostic mammography at the [facility], which was performed March 13, 2009.  Dr Sadie Haber at that exam was able to palpate a pea-size nodule at the 12 o'clock position in the subareolar region of the right breast.  On mammography, the breast was largely fatty and there was a tiny subcutaneous nodule in the right subareolar region.  There were no calcifications associated with this and no other mammographic abnormalities.  By ultrasound, the nodule in question had ill-defined borders and measured 6 mm.  Sonography of the right axilla showed no abnormal lymph nodes.  Biopsy was suggested and was performed on February 4.  The pathology from that procedure ([id number]) showed an invasive ductal carcinoma described as high grade by Dr Rodney Cruise.    With this information, the patient was set up for bilateral breast MRIs on February 14 and this showed in the upper central portion of the right breast a 13 mm lesion correlating well with the ultrasound and mammogram previously described.  The left breast was unremarkable and there was no adenopathy noted.  Incidentally, in the right liver, there was a 2 cm lesion which showed enhancement post gadolinium but could not be further characterized.  Statistically this was felt to be a hemangioma but certainly will require further evaluation.  With this information, the patient proceeded to right lumpectomy and sentinel lymph node sampling May 20, 2009, under Dr Dalbert Batman.  The final pathology from this procedure  ([id number]) showed a 1.3 cm invasive ductal carcinoma, grade 1, with no evidence of lymphovascular invasion and ample margins.  Two sentinel lymph nodes were clear. Tumor was ER positive, PR negative, and HER-2/neu negative, with an MIB-1 of 3%.  Patient received radiation therapy which was completed in May of 2011. At that time she was started on anastrozole, 1 mg daily, and continues with good tolerance.  Subsequent history is as detailed below.  INTERVAL HISTORY: Chrishawna returns  Today for follow-up of her breast cancer. She is completing 5 years of anastrozole this month. She tolerated it fine, with some hot flashes initially but those have subsided. She does have some vaginal dryness issues. She never developed any arthralgias or myalgias that some patients can experience on this medication  REVIEW OF SYSTEMS: Rameen  Tells me she was drinking "quite a bit" and that landed her in the hospital with like stools she says. She has herself cut back on her drinking and now she feels better. She has a little bit of sinus issues related to pollen, with ringing in her ears and a little bit of a dry cough. She has back and joint pain which is not more intense or persistent than before. She feels a little forgetful, but not depressed or anxious. A detailed review of systems today was otherwise stable. of systems is otherwise stable and noncontributory.   PAST MEDICAL HISTORY: Past Medical History  Diagnosis Date  . Hypokalemia 07/17/2011  . Hepatic steatosis 07/17/2011  . Breast cancer, right breast 05/2009  . Hypertension   .  Arthritis     "back, right hand" (07/11/2014)  . Chronic lower back pain   . Lower GI bleeding 07/11/2014 hospitalized    PAST SURGICAL HISTORY: Past Surgical History  Procedure Laterality Date  . Breast biopsy Right 2011  . Breast lumpectomy Right 2011  . Dilation and curettage of uterus  07/2009  . Mastectomy complete / simple w/ sentinel node biopsy  05/2009    Archie Endo  05/23/2009  . Esophagogastroduodenoscopy N/A 07/12/2014    Procedure: ESOPHAGOGASTRODUODENOSCOPY (EGD);  Surgeon: Clarene Essex, MD;  Location: [facility] ENDOSCOPY;  Service: Endoscopy;  Laterality: N/A;    FAMILY HISTORY Family History  Problem Relation Age of Onset  . Cancer Other   . Diabetes Mother   . Diabetes Brother   . Hypertension Mother   . Hypertension Father   There is no history of breast, ovarian or any other cancer in the family to the patient's knowledge.  GYNECOLOGIC HISTORY:  (Updated 07/22/2013) She is GX, P0.  Last menstrual period was approximately 6 years ago.  She was not on hormone replacement.  She is still having hot flashes.    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  She is single.  Lives alone.  Has no pets.  Is not a church attender.     ADVANCED DIRECTIVES: not in place  HEALTH MAINTENANCE:  (updated 07/22/2013)  History  Substance Use Topics  . Smoking status: Never Smoker   . Smokeless tobacco: Never Used  . Alcohol Use: 12.6 oz/week    21 Cans of beer per week     Comment: 07/11/2014 "~ 3 , 12oz, beers/day"     Colonoscopy:  May 2014 [place]  PAP: Not on file   Bone density: 07/06/2012, normal  Lipid panel: Not on file   Allergies  Allergen Reactions  . Other Other (See Comments)    Seasonal hay fever    Current Outpatient Prescriptions  Medication Sig Dispense Refill  . amLODipine (NORVASC) 2.5 MG tablet Take 2 tablets (5 mg total) by mouth daily. 90 tablet 1  . cholecalciferol (VITAMIN D) 1000 UNITS tablet Take 1,000 Units by mouth daily.    . Cholecalciferol 50000 UNITS capsule Take 1 capsule (50,000 Units total) by mouth once a week. 12 capsule 0  . DULoxetine (CYMBALTA) 30 MG capsule Take 1 capsule (30 mg total) by mouth daily. 60 capsule 0  . pantoprazole (PROTONIX) 40 MG tablet Take 1 tablet (40 mg total) by mouth daily. 30 tablet 3   No current facility-administered  medications for this visit.    OBJECTIVE: middle-aged African American  Woman in no acute distress Filed Vitals:   08/09/14 1405  BP: 142/76  Pulse: 91  Resp: 18     Body mass index is 34.1 kg/(m^2).    ECOG FS: 0 Filed Weights   08/09/14 1405  Weight: 174 lb 9.6 oz (79.198 kg)   Sclerae unicteric, pupilsround and equal Oropharynx clear and moist-- no thrushor other lesions No cervical or supraclavicular adenopathy Lungs no rales or rhonchi Heart regular rate and rhythm Abd soft, obese,nontender, positive bowel sounds MSK no focal spinal tenderness, no upper extremity lymphedema Neuro: nonfocal, well oriented, positive affect Breasts: the right breast is status post central lumpectomy. There is no evidence of local recurrence. The right axilla is benign. Left breast is unremarkable.  LAB RESULTS: Lab Results  Component Value Date   WBC 6.9 08/02/2014   NEUTROABS 3.0 08/02/2014  HGB 9.2* 08/02/2014   HCT 28.3* 08/02/2014   MCV 88.7 08/02/2014   PLT 348 08/02/2014      Chemistry      Component Value Date/Time   NA 140 08/02/2014 0924   NA 135 07/13/2014 0558   K 3.4* 08/02/2014 0924   K 3.4* 07/13/2014 0558   CL 103 07/13/2014 0558   CL 98 07/14/2012 1400   CO2 28 08/02/2014 0924   CO2 26 07/13/2014 0558   BUN 6.4* 08/02/2014 0924   BUN 10 07/13/2014 0558   CREATININE 0.8 08/02/2014 0924   CREATININE 0.92 07/13/2014 0558   CREATININE 0.70 03/15/2014 1442      Component Value Date/Time   CALCIUM 9.1 08/02/2014 0924   CALCIUM 8.8 07/13/2014 0558   ALKPHOS 70 08/02/2014 0924   ALKPHOS 42 07/11/2014 1900   AST 13 08/02/2014 0924   AST 28 07/11/2014 1900   ALT 12 08/02/2014 0924   ALT 24 07/11/2014 1900   BILITOT 0.25 08/02/2014 0924   BILITOT 0.7 07/11/2014 1900       STUDIES: CLINICAL DATA: Transaminitis. History of alcohol abuse.  EXAM: ULTRASOUND ABDOMEN COMPLETE  COMPARISON: CT abdomen and pelvis 04/25/2012  FINDINGS: Gallbladder: No  gallstones or wall thickening visualized. No sonographic Murphy sign noted.  Common bile duct: Diameter: 6 mm  Liver: Dense with diffusely echogenic echotexture. No focal abnormality identified.  IVC: No abnormality visualized.  Pancreas: Visualized portion unremarkable.  Spleen: Size and appearance within normal limits.  Right Kidney: Length: 9.8 cm. Echogenicity within normal limits. No mass or hydronephrosis visualized.  Left Kidney: Length: 10.4 cm. Echogenicity within normal limits. No mass or hydronephrosis visualized.  Abdominal aorta: No aneurysm visualized.  Other findings: None.  IMPRESSION: Diffusely echogenic liver, often seen in the setting of hepatic steatosis although nonspecific with other considerations including chronic hepatitis, cirrhosis, and other infiltrative processes.   Electronically Signed  By: Logan Bores  On: 03/21/2014 10:32  Repeat bilateral mammography 2 later this month.  ASSESSMENT: 64 y.o.  [place] woman with:  1. Breast cancer, status post right lumpectomy and sentinel lymph node biopsy February 2011 for a T1c N0 grade 1 invasive ductal carcinoma, estrogen receptor positive, progesterone receptor and HER2 negative, with an MIB-1 of 3%, status post radiation therapy completed May 2011, at which time she began anastrozole. 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  completed 5 years of anastrozole May 2016 3. History of an ovarian cyst on the right, the patient declining surgery and currently asymptomatic.  4. Persistently elevated CA27.29, stable, with no evidence of metastatic disease--no longer being followed, as per NCCN guidelins.   5. History of hepatic steatosis.   6. History of hypokalemia, now  normalized  7. Gastrointestinal complaints, followed by Dr. Michail Sermon 8. Arthritis, particularly involving the back and the right knee, followed by Dr. Lynann Bologna.      PLAN: Coleen has completed her 5 years of antiestrogen therapy and is now ready to "graduate". She understands that the data simply stops for aromatase inhibitors. The only study I am aware of that randomized patients like her to 5 more years of an aromatase inhibitor versus stopping has never been reported  Accordingly we are releasing her to her primary care physician. All she will need in terms of breast cancer follow-up is a yearly physician breast exam and yearly mammography, preferably with tomography.  I will be glad to see Welda I any point  in the future if I when the need arises, but as of now we are making no further routine appointments for her here. Chauncey Cruel, MD     08/09/2014

## 2014-08-10 ENCOUNTER — Ambulatory Visit
Admission: RE | Admit: 2014-08-10 | Discharge: 2014-08-10 | Disposition: A | Discharge status: Home / Self Care | Payer: Commercial Managed Care - HMO | Source: Ambulatory Visit | Attending: Family Medicine | Admitting: Family Medicine

## 2014-08-10 DIAGNOSIS — M5137 Other intervertebral disc degeneration, lumbosacral region: Secondary | ICD-10-CM | POA: Diagnosis not present

## 2014-08-10 DIAGNOSIS — M47817 Spondylosis without myelopathy or radiculopathy, lumbosacral region: Secondary | ICD-10-CM | POA: Diagnosis not present

## 2014-08-10 DIAGNOSIS — M4317 Spondylolisthesis, lumbosacral region: Secondary | ICD-10-CM | POA: Diagnosis not present

## 2014-08-10 DIAGNOSIS — M545 Low back pain: Secondary | ICD-10-CM

## 2014-08-15 ENCOUNTER — Other Ambulatory Visit: Payer: Self-pay | Admitting: Emergency Medicine

## 2014-08-15 ENCOUNTER — Ambulatory Visit (HOSPITAL_COMMUNITY)
Admission: RE | Admit: 2014-08-15 | Discharge: 2014-08-15 | Disposition: A | Discharge status: Home / Self Care | Payer: Commercial Managed Care - HMO | Source: Ambulatory Visit | Attending: Oncology | Admitting: Oncology

## 2014-08-15 ENCOUNTER — Other Ambulatory Visit: Payer: Self-pay | Admitting: Family Medicine

## 2014-08-15 DIAGNOSIS — R2231 Localized swelling, mass and lump, right upper limb: Secondary | ICD-10-CM

## 2014-08-15 DIAGNOSIS — Z1231 Encounter for screening mammogram for malignant neoplasm of breast: Secondary | ICD-10-CM

## 2014-08-17 DIAGNOSIS — K250 Acute gastric ulcer with hemorrhage: Secondary | ICD-10-CM | POA: Diagnosis not present

## 2014-08-17 DIAGNOSIS — D509 Iron deficiency anemia, unspecified: Secondary | ICD-10-CM | POA: Diagnosis not present

## 2014-09-09 ENCOUNTER — Other Ambulatory Visit: Payer: Self-pay | Admitting: Oncology

## 2014-09-09 DIAGNOSIS — R2231 Localized swelling, mass and lump, right upper limb: Secondary | ICD-10-CM

## 2014-09-12 ENCOUNTER — Ambulatory Visit
Admission: RE | Admit: 2014-09-12 | Discharge: 2014-09-12 | Disposition: A | Discharge status: Home / Self Care | Payer: Commercial Managed Care - HMO | Source: Ambulatory Visit | Attending: Emergency Medicine | Admitting: Emergency Medicine

## 2014-09-12 DIAGNOSIS — R2231 Localized swelling, mass and lump, right upper limb: Secondary | ICD-10-CM

## 2014-09-12 DIAGNOSIS — N63 Unspecified lump in breast: Secondary | ICD-10-CM | POA: Diagnosis not present

## 2014-09-12 DIAGNOSIS — Z853 Personal history of malignant neoplasm of breast: Secondary | ICD-10-CM | POA: Diagnosis not present

## 2014-10-24 ENCOUNTER — Other Ambulatory Visit: Payer: Self-pay | Admitting: *Deleted

## 2014-10-25 MED ORDER — AMLODIPINE BESYLATE 2.5 MG PO TABS: 5.0000 mg | ORAL_TABLET | Freq: Every day | ORAL | Status: DC

## 2014-10-25 MED ORDER — PANTOPRAZOLE SODIUM 40 MG PO TBEC: 40.0000 mg | DELAYED_RELEASE_TABLET | Freq: Every day | ORAL | Status: DC

## 2014-10-25 NOTE — Telephone Encounter (Signed)
2nd request.  Syan Cullimore L, RN  

## 2014-10-25 NOTE — Telephone Encounter (Signed)
Pt called and needs a refill on her Protonix and Amlodipine called in. jw

## 2014-10-27 ENCOUNTER — Ambulatory Visit: Payer: Commercial Managed Care - HMO | Admitting: Family Medicine

## 2014-11-09 ENCOUNTER — Ambulatory Visit (INDEPENDENT_AMBULATORY_CARE_PROVIDER_SITE_OTHER): Payer: Commercial Managed Care - HMO | Admitting: Family Medicine

## 2014-11-09 DIAGNOSIS — M25561 Pain in right knee: Secondary | ICD-10-CM | POA: Diagnosis not present

## 2014-11-09 DIAGNOSIS — M25562 Pain in left knee: Secondary | ICD-10-CM

## 2014-11-09 MED ORDER — METHYLPREDNISOLONE ACETATE 40 MG/ML IJ SUSP
40.0000 mg | Freq: Once | INTRAMUSCULAR | Status: AC
  Administered 2014-11-09: 40 mg via INTRA_ARTICULAR

## 2014-11-09 NOTE — Patient Instructions (Addendum)
Thank you for coming to see me today. It was a pleasure. Today we talked about:   Knee pain: I have injected some medication into your knees today. You might start having some pain after the next 24 hours which should hopefully improve in the next 2-3 days. If you have any redness, worsening pain, stiffness, please return for follow-up  Please make an appointment to see me in one month for follow-up.  If you have any questions or concerns, please do not hesitate to call the office at [phone].  Sincerely,  Cordelia Poche, MD  Knee Injection Joint injections are shots. Your caregiver will place a needle into your knee joint. The needle is used to put medicine into the joint. These shots can be used to help treat different painful knee conditions such as osteoarthritis, bursitis, local flare-ups of rheumatoid arthritis, and pseudogout. Anti-inflammatory medicines such as corticosteroids and anesthetics are the most common medicines used for joint and soft tissue injections.  PROCEDURE  The skin over the kneecap will be cleaned with an antiseptic solution.  Your caregiver will inject a small amount of a local anesthetic (a medicine like Novocaine) just under the skin in the area that was cleaned.  After the area becomes numb, a second injection is done. This second injection usually includes an anesthetic and an anti-inflammatory medicine called a steroid or cortisone. The needle is carefully placed in between the kneecap and the knee, and the medicine is injected into the joint space.  After the injection is done, the needle is removed. Your caregiver may place a bandage over the injection site. The whole procedure takes no more than a couple of minutes. BEFORE THE PROCEDURE  Wash all of the skin around the entire knee area. Try to remove any loose, scaling skin. There is no other specific preparation necessary unless advised otherwise by your caregiver. LET YOUR CAREGIVER KNOW ABOUT:    Allergies.  Medications taken including herbs, eye drops, over the counter medications, and creams.  Use of steroids (by mouth or creams).  Possible pregnancy, if applicable.  Previous problems with anesthetics or Novocaine.  History of blood clots (thrombophlebitis).  History of bleeding or blood problems.  Previous surgery.  Other health problems. RISKS AND COMPLICATIONS Side effects from cortisone shots are rare. They include:   Slight bruising of the skin.  Shrinkage of the normal fatty tissue under the skin where the shot was given.  Increase in pain after the shot.  Infection.  Weakening of tendons or tendon rupture.  Allergic reaction to the medicine.  Diabetics may have a temporary increase in their blood sugar after a shot.  Cortisone can temporarily weaken the immune system. While receiving these shots, you should not get certain vaccines. Also, avoid contact with anyone who has chickenpox or measles. Especially if you have never had these diseases or have not been previously immunized. Your immune system may not be strong enough to fight off the infection while the cortisone is in your system. AFTER THE PROCEDURE   You can go home after the procedure.  You may need to put ice on the joint 15-20 minutes every 3 or 4 hours until the pain goes away.  You may need to put an elastic bandage on the joint. HOME CARE INSTRUCTIONS   Only take over-the-counter or prescription medicines for pain, discomfort, or fever as directed by your caregiver.  You should avoid stressing the joint. Unless advised otherwise, avoid activities that put a lot of  pressure on a knee joint, such as:  Jogging.  Bicycling.  Recreational climbing.  Hiking.  Laying down and elevating the leg/knee above the level of your heart can help to minimize swelling. SEEK MEDICAL CARE IF:   You have repeated or worsening swelling.  There is drainage from the puncture area.  You  develop red streaking that extends above or below the site where the needle was inserted. SEEK IMMEDIATE MEDICAL CARE IF:   You develop a fever.  You have pain that gets worse even though you are taking pain medicine.  The area is red and warm, and you have trouble moving the joint. MAKE SURE YOU:   Understand these instructions.  Will watch your condition.  Will get help right away if you are not doing well or get worse. Document Released: 06/09/2006 Document Revised: 06/10/2011 Document Reviewed: 03/06/2007 [employer] Patient Information 2015 [place], [place]. This information is not intended to replace advice given to you by your health care provider. Make sure you discuss any questions you have with your health care provider.

## 2014-11-09 NOTE — Progress Notes (Signed)
    Subjective   Carrie [name] is a 64 y.o. female that presents for a same day visit  1. Knee pain, bilateral: This is a chronic issue. She has previous x-rays showing osteoarthritis. Steroid shots help her for about 6 months. No adverse reaction to knee injections. She is able to ambulate normally with pain.   ROS Per HPI  Social History  Substance Use Topics  . Smoking status: Never Smoker   . Smokeless tobacco: Never Used  . Alcohol Use: 12.6 oz/week    21 Cans of beer per week     Comment: 07/11/2014 "~ 3 , 12oz, beers/day"    Allergies  Allergen Reactions  . Other Other (See Comments)    Seasonal hay fever    Objective   BP 150/83 mmHg  Pulse 95  Temp(Src) 98.3 F (36.8 C) (Oral)  Ht 5' (1.524 m)  Wt 181 lb 1.6 oz (82.146 kg)  BMI 35.37 kg/m2  General: Well appearing, no distress Musculoskeletal:  Knee Exam:  Laterality: right Appearance:  Edema: no   Tenderness: no   Range of Motion: Passive Extension: normal Flexion:normal Active Extension: normal Flexion: normal Maneuvers: Patellar Compression: Negative Gait: normal  Laterality: left Appearance:  Edema: no   Tenderness: no   Range of Motion: Passive Extension: normal Flexion:normal Active Extension: normal Flexion: normal Maneuvers: Patellar Compression: Negative Gait: normal   Assessment and Plan   Meds ordered this encounter  Medications  . methylPREDNISolone acetate (DEPO-MEDROL) injection 40 mg    Sig:   . methylPREDNISolone acetate (DEPO-MEDROL) injection 40 mg    Sig:    INJECTION:  Patient was given informed consent, signed copy in the chart. Appropriate time out was taken. Area prepped and draped in usual sterile fashion. One cc of methylprednisolone 40 mg/ml plus three cc of 1% lidocaine without epinephrine was injected into the right knee joint using anterolateral approach. One cc of methylprednisolone 40 mg/ml plus three cc of 1% lidocaine without epinephrine was injected into  the left knee joint using anterolateral approach. The patient tolerated the procedure well. There were no complications. Post procedure instructions were given.   Bilateral knee injections  Return precautions

## 2014-11-29 ENCOUNTER — Encounter: Payer: Self-pay | Admitting: Family Medicine

## 2014-11-29 ENCOUNTER — Ambulatory Visit (INDEPENDENT_AMBULATORY_CARE_PROVIDER_SITE_OTHER): Payer: Commercial Managed Care - HMO | Admitting: Family Medicine

## 2014-11-29 VITALS — BP 143/86 | HR 85 | Temp 98.7°F | Ht 60.0 in | Wt 181.0 lb

## 2014-11-29 DIAGNOSIS — M545 Low back pain: Secondary | ICD-10-CM

## 2014-11-29 DIAGNOSIS — R252 Cramp and spasm: Secondary | ICD-10-CM

## 2014-11-29 DIAGNOSIS — I10 Essential (primary) hypertension: Secondary | ICD-10-CM

## 2014-11-29 LAB — COMPLETE METABOLIC PANEL WITH GFR
ALBUMIN: 4.2 g/dL (ref 3.6–5.1)
ALK PHOS: 59 U/L (ref 33–130)
ALT: 19 U/L (ref 6–29)
AST: 31 U/L (ref 10–35)
BUN: 9 mg/dL (ref 7–25)
CO2: 27 mmol/L (ref 20–31)
Calcium: 9.6 mg/dL (ref 8.6–10.4)
Chloride: 100 mmol/L (ref 98–110)
Creat: 0.79 mg/dL (ref 0.50–0.99)
GFR, EST NON AFRICAN AMERICAN: 80 mL/min (ref >=60)
GFR, Est African American: >89 mL/min (ref >=60)
GLUCOSE: 84 mg/dL (ref 65–99)
POTASSIUM: 4 mmol/L (ref 3.5–5.3)
SODIUM: 135 mmol/L (ref 135–146)
Total Bilirubin: 0.4 mg/dL (ref 0.2–1.2)
Total Protein: 7.8 g/dL (ref 6.1–8.1)

## 2014-11-29 LAB — MAGNESIUM: Magnesium: 2.1 mg/dL (ref 1.5–2.5)

## 2014-11-29 MED ORDER — ACETAMINOPHEN ER 650 MG PO TBCR: 650.0000 mg | EXTENDED_RELEASE_TABLET | Freq: Three times a day (TID) | ORAL | Status: DC | PRN

## 2014-11-29 NOTE — Patient Instructions (Signed)
Thank you for coming to see me today. It was a pleasure. Today we talked about:   Back pain: We will try Tylenol 650mg  three times per day  Muscle cramping: I will check your electrolytes today. You will need to increase your water intake and decrease your alcohol intake.   High blood pressure: your blood pressure is not controlled. We will discuss changes at your next visit  Please make an appointment to see me in one month for follow-up.  If you have any questions or concerns, please do not hesitate to call the office at [phone].  Sincerely,  Cordelia Poche, MD

## 2014-11-29 NOTE — Progress Notes (Signed)
    Subjective    Carrie [name] is a 64 y.o. female that presents for a follow-up visit for chronic issues.   1. Cramps: Mostly in right leg. They are occuring every other week and are very painful and usually last around 10 minutes. She has not taken anything for her cramping. She is still drinking a lot of beer and not much water.  2. Back pain: Chronic. She generally uses ibuprofen and Tylenol which helps.  3. Hypertension: Adherent with amlodipine 2.5mg . Blood pressure not controlled, but patient states it is because she just came from her brother's funeral  4. Excessive alcohol use: patient not ready to quit. Does not want any help with this matter.  Social History  Substance Use Topics  . Smoking status: Never Smoker   . Smokeless tobacco: Never Used  . Alcohol Use: 12.6 oz/week    21 Cans of beer per week     Comment: 07/11/2014 "~ 3 , 12oz, beers/day"    Allergies  Allergen Reactions  . Other Other (See Comments)    Seasonal hay fever    No orders of the defined types were placed in this encounter.    ROS  Per HPI   Objective   BP 143/86 mmHg  Pulse 85  Temp(Src) 98.7 F (37.1 C) (Oral)  Ht 5' (1.524 m)  Wt 181 lb (82.101 kg)  BMI 35.35 kg/m2  General: well appearing Extremities: Calves without tenderness or swelling  Assessment and Plan   Please refer to problem based charting of assessment and plan

## 2014-11-30 ENCOUNTER — Encounter: Payer: Self-pay | Admitting: Family Medicine

## 2014-12-05 DIAGNOSIS — R252 Cramp and spasm: Secondary | ICD-10-CM | POA: Insufficient documentation

## 2014-12-05 NOTE — Assessment & Plan Note (Signed)
Slightly elevated today. Patient states recent stressor as causing her blood pressure to be elevated. Will recheck at next visit.

## 2014-12-05 NOTE — Assessment & Plan Note (Signed)
Likely related to dehydration as patient drinks a lot of alcohol and minimal water. Cramps are infrequent. Will check CMP today along with magnesium, but suggested increased water intake and continued work on decreasing alcohol intake.

## 2014-12-05 NOTE — Assessment & Plan Note (Signed)
Will prescribe Tylenol 650mg  TID. Discussed important of not using this with continued alcohol use. Will need to monitor for hepatic injury while on this regimen.

## 2014-12-26 ENCOUNTER — Ambulatory Visit: Payer: Commercial Managed Care - HMO | Admitting: Family Medicine

## 2015-01-10 ENCOUNTER — Ambulatory Visit (INDEPENDENT_AMBULATORY_CARE_PROVIDER_SITE_OTHER): Payer: Commercial Managed Care - HMO | Admitting: Family Medicine

## 2015-01-10 ENCOUNTER — Other Ambulatory Visit: Payer: Self-pay | Admitting: Family Medicine

## 2015-01-10 ENCOUNTER — Encounter: Payer: Self-pay | Admitting: Family Medicine

## 2015-01-10 VITALS — BP 170/81 | HR 91 | Temp 97.8°F | Ht 60.0 in | Wt 180.0 lb

## 2015-01-10 DIAGNOSIS — R109 Unspecified abdominal pain: Secondary | ICD-10-CM | POA: Diagnosis not present

## 2015-01-10 DIAGNOSIS — L720 Epidermal cyst: Secondary | ICD-10-CM | POA: Diagnosis not present

## 2015-01-10 DIAGNOSIS — M25561 Pain in right knee: Secondary | ICD-10-CM | POA: Diagnosis not present

## 2015-01-10 DIAGNOSIS — R252 Cramp and spasm: Secondary | ICD-10-CM | POA: Diagnosis not present

## 2015-01-10 DIAGNOSIS — I10 Essential (primary) hypertension: Secondary | ICD-10-CM | POA: Diagnosis not present

## 2015-01-10 LAB — LIPASE: Lipase: 25 U/L (ref 7–60)

## 2015-01-10 MED ORDER — AMLODIPINE BESYLATE 10 MG PO TABS: 10.0000 mg | ORAL_TABLET | Freq: Every day | ORAL | Status: DC

## 2015-01-10 NOTE — Assessment & Plan Note (Signed)
Will increase to amlodipine 10mg  daily

## 2015-01-10 NOTE — Assessment & Plan Note (Signed)
Possibly related to rib inflammation, although unsure of what could be causing that. More likely is acute pancreatitis. - Rib x-ray - Lipase

## 2015-01-10 NOTE — Assessment & Plan Note (Signed)
Patient still drinking a lot of alcohol. Reiterated importance of minimizing aggravating factors of cramps. Will continue to encourage hydration

## 2015-01-10 NOTE — Patient Instructions (Signed)
Thank you for coming to see me today. It was a pleasure. Today we talked about:   Stomach pain: I will check your Lipase. I will also have you get an x-ray of your chest  Right leg pain: I will refer you to physical therapy. We may need to eventually work on a surgical option for you once you start drinking less  Hand/leg cramps: Try to drink more water and less beer to see if this helps  Blood pressure: I will increase you to amlodipine 10mg  daily  Back cyst: if this starts to bother you, I can refer you to have this removed  Please make an appointment to see me in 1 month for follow-up.  If you have any questions or concerns, please do not hesitate to call the office at [phone].  Sincerely,  Cordelia Poche, MD

## 2015-01-10 NOTE — Assessment & Plan Note (Signed)
This is a chronic issue. Pain management not adequate. Patient will not tolerate NSAIDs or Tramadol. Tylenol not helping. Will refer to physical therapy

## 2015-01-10 NOTE — Progress Notes (Signed)
    Subjective    Carrie [name] is a 64 y.o. female that presents for a follow-up visit for chronic issues.   1. Abdominal cramping: This is a chronic issue. This has been getting worse. Worse on the left but also on the right. Patient states she can feel the pain when she touches the area. Sometimes she has nausea and emesis. Pain is worse after she drinks beer. She last had the pain a few minutes before presenting to the office. No fevers.  2. Right leg pain: She has recently gotten a steroid injection but the pain has returned. She has been taking Tylenol 650mg  TID which has not helped much with the pain. Patient has considered surgery in the past but was told she has to stop drinking for at least a month.  3. Cramps: She has cramping in her hands and her feet. This is a chronic issue.   4. Hypertension: Patient is adherent with amlodipine 5mg  daily. She reports no chest pain or shortness of breath  5. Lesion on back: Patient states this lesion probably popped up about 3 months ago. The lesion is not painful but is itchy. There is no bleeding. She describes black and stick out of her skin. No history or family history of squamous cell or melanoma.  Social History  Substance Use Topics  . Smoking status: Never Smoker   . Smokeless tobacco: Never Used  . Alcohol Use: 12.6 oz/week    21 Cans of beer per week     Comment: 07/11/2014 "~ 3 , 12oz, beers/day"    Allergies  Allergen Reactions  . Other Other (See Comments)    Seasonal hay fever    No orders of the defined types were placed in this encounter.    ROS  Per HPI   Objective   BP 170/81 mmHg  Pulse 91  Temp(Src) 97.8 F (36.6 C) (Oral)  Ht 5' (1.524 m)  Wt 180 lb (81.647 kg)  BMI 35.15 kg/m2  General: Well appearing, no distress Gastrointestinal: Soft, tenderness along lower ribs bilaterally. No epigastric tenderness. No abdominal tenderness Skin: Small cyst on upper back with no erythema or drainage.  Non-tender.  Assessment and Plan    Right knee pain This is a chronic issue. Pain management not adequate. Patient will not tolerate NSAIDs or Tramadol. Tylenol not helping. Will refer to physical therapy  Muscle cramping Patient still drinking a lot of alcohol. Reiterated importance of minimizing aggravating factors of cramps. Will continue to encourage hydration  Left sided abdominal pain Possibly related to rib inflammation, although unsure of what could be causing that. More likely is acute pancreatitis. - Rib x-ray - Lipase  Essential hypertension, benign Will increase to amlodipine 10mg  daily  Epidermoid cyst of skin Asymptomatic. No active management

## 2015-01-10 NOTE — Assessment & Plan Note (Signed)
Asymptomatic. No active management

## 2015-01-11 ENCOUNTER — Other Ambulatory Visit: Payer: Self-pay | Admitting: *Deleted

## 2015-01-12 LAB — VITAMIN D 25 HYDROXY (VIT D DEFICIENCY, FRACTURES): VIT D 25 HYDROXY: 29 ng/mL — AB (ref 30–100)

## 2015-01-12 NOTE — Telephone Encounter (Signed)
Patient came in yesterday and had a lipase drawn. Checked with lab to see if this can be added on.  Can you please place a future order. Jazmin Hartsell,CMA

## 2015-01-12 NOTE — Telephone Encounter (Signed)
Patient needs recheck on vitamin D level. Will see if she needs replacement therapy after obtaining level

## 2015-01-12 NOTE — Telephone Encounter (Signed)
Vitamin D was able to be added to yesterday's labs so patient will not need to be stuck again. Monterrio Gerst,CMA

## 2015-01-20 ENCOUNTER — Ambulatory Visit: Payer: Commercial Managed Care - HMO | Attending: Family Medicine

## 2015-01-20 DIAGNOSIS — R6889 Other general symptoms and signs: Secondary | ICD-10-CM | POA: Insufficient documentation

## 2015-01-20 DIAGNOSIS — M6289 Other specified disorders of muscle: Secondary | ICD-10-CM | POA: Diagnosis not present

## 2015-01-20 DIAGNOSIS — M25561 Pain in right knee: Secondary | ICD-10-CM

## 2015-01-20 DIAGNOSIS — M545 Low back pain, unspecified: Secondary | ICD-10-CM

## 2015-01-20 DIAGNOSIS — R29898 Other symptoms and signs involving the musculoskeletal system: Secondary | ICD-10-CM

## 2015-01-20 NOTE — Therapy (Signed)
[place], [place], [address] Phone: [phone]   Fax:  [phone]  Physical Therapy Evaluation  Patient Details  Name: Carrie [name] MRN: [id number] Date of Birth: 08-14-1950 Referring Provider: Cordelia Poche, MD  Encounter Date: 01/20/2015      PT End of Session - 01/20/15 1142    Visit Number 1   Number of Visits 12   Date for PT Re-Evaluation 03/03/15   PT Start Time 1100   PT Stop Time 1142   PT Time Calculation (min) 42 min   Activity Tolerance Patient tolerated treatment well   Behavior During Therapy [facility] for tasks assessed/performed      Past Medical History  Diagnosis Date  . Hypokalemia 07/17/2011  . Hepatic steatosis 07/17/2011  . Breast cancer, right breast ([place]) 05/2009  . Hypertension   . Arthritis     "back, right hand" (07/11/2014)  . Chronic lower back pain   . Lower GI bleeding 07/11/2014 hospitalized    Past Surgical History  Procedure Laterality Date  . Breast biopsy Right 2011  . Breast lumpectomy Right 2011  . Dilation and curettage of uterus  07/2009  . Mastectomy complete / simple w/ sentinel node biopsy  05/2009    Archie Endo 05/23/2009  . Esophagogastroduodenoscopy N/A 07/12/2014    Procedure: ESOPHAGOGASTRODUODENOSCOPY (EGD);  Surgeon: Clarene Essex, MD;  Location: [facility] ENDOSCOPY;  Service: Endoscopy;  Laterality: N/A;    There were no vitals filed for this visit.  Visit Diagnosis:  Right knee pain - Plan: PT plan of care cert/re-cert  Weakness of both hips - Plan: PT plan of care cert/re-cert  Activity intolerance - Plan: PT plan of care cert/re-cert  Midline low back pain without sciatica - Plan: PT plan of care cert/re-cert      Subjective Assessment - 01/20/15 1115    Subjective She reports RT knee pain for years.  She reports knee injury 30-40 years ago. My back also hurts me. Back pain for 15  years or more   Limitations --  Knee aches and doesn't really limit.    How long can you sit comfortably? As needed   How long can you stand comfortably? 5-10 min due to knee pain feeling of giving away   How long can you walk comfortably? 100 feet then wants to sit due to knee and back pain   Diagnostic tests Xrays: OA   Patient Stated Goals Get rid of the pain.    Currently in Pain? Yes   Pain Score 0-No pain  at rest, pain cand increase 7/10,     Pain Location Knee   Pain Orientation Right   Pain Descriptors / Indicators Aching;Shooting   Pain Type Chronic pain   Pain Onset More than a month ago   Pain Frequency Intermittent  knee can be pain free sitting   Aggravating Factors  Weight bearing activity   Pain Relieving Factors sitting /rest   Multiple Pain Sites Yes   Pain Score 5   Pain Location Back   Pain Orientation Posterior;Mid   Pain Descriptors / Indicators Aching   Pain Type Chronic pain   Pain Onset More than a month ago   Pain Frequency Constant   Aggravating Factors  weight bearing activity   Pain Relieving Factors medication, sitting, lying            [facility] PT Assessment - 01/20/15 1106    Assessment   Medical Diagnosis RT knee pain  Referring Provider Cordelia Poche, MD   Onset Date/Surgical Date --  7-8 years ago   Next MD Visit in a  month   Prior Therapy No   Precautions   Precautions None   Restrictions   Weight Bearing Restrictions No   Balance Screen   Has the patient fallen in the past 6 months No   Has the patient had a decrease in activity level because of a fear of falling?  No   Is the patient reluctant to leave their home because of a fear of falling?  No   Home Environment   Living Environment Private residence   Living Arrangements Alone   Type of Home Apartment   Home Access Stairs to enter   Entrance Stairs-Number of Steps 7   Entrance Stairs-Rails Left  descending   Prior Function   Level of Independence Independent   Cognition   Overall Cognitive Status Within Functional Limits for tasks  assessed   ROM / Strength   AROM / PROM / Strength AROM;Strength   AROM   AROM Assessment Site Knee   Right/Left Knee Right;Left   Right Knee Extension 0   Right Knee Flexion 125   Left Knee Extension 0   Left Knee Flexion 125   Strength   Overall Strength Comments With all MMT on RT leg there was some inital hesitancey but gave full resitance after except hip extension 4-/5   Strength Assessment Site Knee   Right/Left Knee Right;Left   Right Knee Flexion 5/5   Right Knee Extension 5/5   Left Knee Flexion 5/5   Left Knee Extension 5/5   Flexibility   Soft Tissue Assessment /Muscle Length yes   Hamstrings 65 degrees bilaterally   Ambulation/Gait   Gait velocity .43 feet/sec   Gait Comments No limp but decr stride bilaterally   Balance   Balance Assessed Yes   Static Standing Balance   Static Standing - Balance Support Left upper extremity supported   Static Standing - Level of Assistance 4: Min assist   Static Standing Balance -  Activities  Single Leg Stance - Right Leg;Single Leg Stance - Left Leg   Static Standing - Comment/# of Minutes < 5 sec both RT and LT.                              PT Short Term Goals - 01/20/15 1148    PT SHORT TERM GOAL #1   Title Independent with inital HEP   Time 3   Period Weeks   Status New   PT SHORT TERM GOAL #2   Title She will report pain eased 25% in RT knee with normal home tasks   Time 3   Period Weeks   Status New           PT Long Term Goals - 01/20/15 1149    PT LONG TERM GOAL #1   Title She will be independent with all HEP issued as of last visit   Time 6   Period Weeks   Status New   PT LONG TERM GOAL #2   Title She will report 40-50% decreased RT knee pain.    Time 6   Period Weeks   Status New   PT LONG TERM GOAL #3   Title She will demo 4+/5 hip extenison strength to ease knee pain   Time 6   Period Weeks   Status New  PT LONG TERM GOAL #4   Title She will be able to  stand on one  leg for  10 second s to improve walking tolerance   Time 6   Period Weeks   Status New               Plan - 02/07/2015 1143    Clinical Impression Statement ms [place] reports chronic RT knee and LBP. Her strength is normal except hip extension bilaterally and balance on both legs is poor. Back pain limits as much as knee pain.If Dr Lonny Prude  orders treatment for LBP we will add the back to treatment. Due to chronicity of problem we will mostly emphasize a HEP for strength and stability and stretching.      Pt will benefit from skilled therapeutic intervention in order to improve on the following deficits Pain;Decreased activity tolerance;Decreased strength;Increased muscle spasms;Decreased balance;Decreased range of motion   Rehab Potential Good   PT Frequency 2x / week   PT Duration 6 weeks  And for her knees we will try some tale and [place] but mostly establish a program for strength and stretching probably over 4 visits   PT Treatment/Interventions Cryotherapy;Iontophoresis 4mg /ml Dexamethasone;Moist Heat;Ultrasound;Therapeutic exercise;Manual techniques;Taping;Dry needling;Patient/family education;Passive range of motion   PT Next Visit Plan Initiate HEP for hip and quad strength, taps RT knee   Consulted and Agree with Plan of Care Patient          G-Codes - February 07, 2015 1141    Functional Assessment Tool Used FO TO 65%   Functional Limitation Mobility: Walking and moving around   Mobility: Walking and Moving Around Current Status ([id number]) At least 60 percent but less than 80 percent impaired, limited or restricted   Mobility: Walking and Moving Around Goal Status [phone]) At least 40 percent but less than 60 percent impaired, limited or restricted       Problem List Patient Active Problem List   Diagnosis Date Noted  . Left sided abdominal pain 01/10/2015  . Epidermoid cyst of skin 01/10/2015  . Muscle cramping 12/05/2014  . Melena 07/11/2014  . UGI bleed 07/11/2014  . UGIB  (upper gastrointestinal bleed) 07/11/2014  . Low back pain 06/14/2014  . Preventative health care 03/15/2014  . Diabetes mellitus screening 02/22/2014  . Bilateral knee pain 02/08/2014  . Gastritis 09/17/2013  . Right knee pain 08/04/2013  . Left knee pain 08/04/2013  . Transaminitis 07/23/2013  . Essential hypertension, benign 07/23/2013  . GERD (gastroesophageal reflux disease) 07/23/2013  . Vitamin D deficiency 07/22/2013  . Breast cancer, left breast ([place]) 07/17/2011  . FIBROIDS, UTERUS 01/30/2007  . Alcohol abuse 01/30/2007    Darrel Hoover PT 02-07-2015, 11:57 AM  [facility] [address], [place], [address] Phone: [phone]   Fax:  [phone]  Name: KADIAN BARCELLOS MRN: [id number] Date of Birth: 1951/02/11

## 2015-01-27 ENCOUNTER — Ambulatory Visit: Payer: Commercial Managed Care - HMO | Admitting: Physical Therapy

## 2015-01-27 DIAGNOSIS — M545 Low back pain, unspecified: Secondary | ICD-10-CM

## 2015-01-27 DIAGNOSIS — R6889 Other general symptoms and signs: Secondary | ICD-10-CM | POA: Diagnosis not present

## 2015-01-27 DIAGNOSIS — M6289 Other specified disorders of muscle: Secondary | ICD-10-CM | POA: Diagnosis not present

## 2015-01-27 DIAGNOSIS — M25561 Pain in right knee: Secondary | ICD-10-CM

## 2015-01-27 DIAGNOSIS — R29898 Other symptoms and signs involving the musculoskeletal system: Secondary | ICD-10-CM

## 2015-01-27 NOTE — Patient Instructions (Signed)
   Copyright  VHI. All rights reserved.  HIP: Flexion / KNEE: Extension, Straight Leg Raise   Raise leg, keeping knee straight. Perform slowly. _10-15__ reps per set, _2__ sets per day, _7__ days per week  Bridge    Lie back, legs bent. Inhale, pressing hips up. Keeping ribs in, lengthen lower back. Exhale, rolling down along spine from top. Repeat _10___ times. Do ___2_ sessions per day.  http://pm.exer.us/55   Copyright  VHI. All rights reserved.   Hamstring Step 1    Straighten left knee. Use hands or strap to pull leg toward you until you feel a stretch on the back of your thigh. Hold _30__ seconds. Relax knee by returning foot to start. Repeat _3__ times.  Copyright  VHI. All rights reserved.

## 2015-01-27 NOTE — Therapy (Signed)
[place], [place], [address] Phone: [phone]   Fax:  [phone]  Physical Therapy Treatment  Patient Details  Name: Carrie [name] MRN: [id number] Date of Birth: 1951/03/04 Referring Provider: Cordelia Poche, MD  Encounter Date: 01/27/2015      PT End of Session - 01/27/15 1020    Visit Number 2   Number of Visits 12   Date for PT Re-Evaluation 03/03/15   PT Start Time 1017   PT Stop Time 1100   PT Time Calculation (min) 43 min      Past Medical History  Diagnosis Date  . Hypokalemia 07/17/2011  . Hepatic steatosis 07/17/2011  . Breast cancer, right breast ([place]) 05/2009  . Hypertension   . Arthritis     "back, right hand" (07/11/2014)  . Chronic lower back pain   . Lower GI bleeding 07/11/2014 hospitalized    Past Surgical History  Procedure Laterality Date  . Breast biopsy Right 2011  . Breast lumpectomy Right 2011  . Dilation and curettage of uterus  07/2009  . Mastectomy complete / simple w/ sentinel node biopsy  05/2009    Archie Endo 05/23/2009  . Esophagogastroduodenoscopy N/A 07/12/2014    Procedure: ESOPHAGOGASTRODUODENOSCOPY (EGD);  Surgeon: Clarene Essex, MD;  Location: [facility] ENDOSCOPY;  Service: Endoscopy;  Laterality: N/A;    There were no vitals filed for this visit.  Visit Diagnosis:  Right knee pain  Weakness of both hips  Activity intolerance  Midline low back pain without sciatica      Subjective Assessment - 01/27/15 1025    Currently in Pain? Yes   Pain Score 6    Pain Location Knee   Pain Orientation Right   Pain Descriptors / Indicators Tender   Aggravating Factors  weight bearing activity   Pain Relieving Factors sitting/rest            [facility] PT Assessment - 01/27/15 1034    Strength   Strength Assessment Site Hip   Right/Left Hip Right;Left   Right Hip Flexion 4/5   Right Hip Extension 4-/5   Right Hip ABduction 4+/5   Left Hip Flexion 4+/5   Left Hip  Extension 3/5   Left Hip ABduction 4+/5   Left Hip ADduction 4+/5                     [facility] Adult PT Treatment/Exercise - 01/27/15 0001    Knee/Hip Exercises: Stretches   Active Hamstring Stretch 3 reps;30 seconds   Knee/Hip Exercises: Aerobic   Nustep L3 5 minutes LE only   Knee/Hip Exercises: Seated   Long Arc Quad 10 reps   Long Arc Quad Limitations 5 sec hold   Knee/Hip Exercises: Supine   Quad Sets Right;10 reps   Bridges 10 reps   Straight Leg Raises 10 reps   Straight Leg Raises Limitations bilateral    Knee/Hip Exercises: Sidelying   Hip ABduction 10 reps   Hip ABduction Limitations bilateral   Knee/Hip Exercises: Prone   Hip Extension 10 reps   Hip Extension Limitations 8 reps on left -fatigue   Modalities   Modalities Iontophoresis;Ultrasound   Ultrasound   Ultrasound Location Medial right knee   Ultrasound Parameters  1.2 w/cm2 pulsed x 8 min   Ultrasound Goals Pain   Iontophoresis   Type of Iontophoresis Dexamethasone   Location Right medial knee   Dose 1.0 cc   Time 6 hour patch  PT Education - 01/27/15 1101    Education provided Yes   Education Details hamstring stretch, SLR, bridge   Person(s) Educated Patient   Methods Explanation;Handout   Comprehension Verbalized understanding          PT Short Term Goals - 01/20/15 1148    PT SHORT TERM GOAL #1   Title Independent with inital HEP   Time 3   Period Weeks   Status New   PT SHORT TERM GOAL #2   Title She will report pain eased 25% in RT knee with normal home tasks   Time 3   Period Weeks   Status New           PT Long Term Goals - 01/20/15 1149    PT LONG TERM GOAL #1   Title She will be independent with all HEP issued as of last visit   Time 6   Period Weeks   Status New   PT LONG TERM GOAL #2   Title She will report 40-50% decreased RT knee pain.    Time 6   Period Weeks   Status New   PT LONG TERM GOAL #3   Title She will demo 4+/5 hip  extenison strength to ease knee pain   Time 6   Period Weeks   Status New   PT LONG TERM GOAL #4   Title She will be able to  stand on one leg for  10 second s to improve walking tolerance   Time 6   Period Weeks   Status New               Plan - 01/27/15 1102    Clinical Impression Statement Right hip weakness especially right hip flexion and bilateral hip extension. Instructed patient in quad and hip strengthening. Updated HEP. Trial of ultrasound to right medial knee as well as iontophoresesi patch.    PT Next Visit Plan assess benefit of ionto and [place], review HEP and progress quad and hip strength, CHECK Goals        Problem List Patient Active Problem List   Diagnosis Date Noted  . Left sided abdominal pain 01/10/2015  . Epidermoid cyst of skin 01/10/2015  . Muscle cramping 12/05/2014  . Melena 07/11/2014  . UGI bleed 07/11/2014  . UGIB (upper gastrointestinal bleed) 07/11/2014  . Low back pain 06/14/2014  . Preventative health care 03/15/2014  . Diabetes mellitus screening 02/22/2014  . Bilateral knee pain 02/08/2014  . Gastritis 09/17/2013  . Right knee pain 08/04/2013  . Left knee pain 08/04/2013  . Transaminitis 07/23/2013  . Essential hypertension, benign 07/23/2013  . GERD (gastroesophageal reflux disease) 07/23/2013  . Vitamin D deficiency 07/22/2013  . Breast cancer, left breast ([place]) 07/17/2011  . FIBROIDS, UTERUS 01/30/2007  . Alcohol abuse 01/30/2007    Dorene Ar, PTA 01/27/2015, 11:07 AM  [facility] [address] [place], [place], [address] Phone: [phone]   Fax:  [phone]  Name: Carrie [name] MRN: [id number] Date of Birth: [birth year]

## 2015-01-31 ENCOUNTER — Ambulatory Visit (HOSPITAL_COMMUNITY)
Admission: RE | Admit: 2015-01-31 | Discharge: 2015-01-31 | Disposition: A | Discharge status: Home / Self Care | Payer: Commercial Managed Care - HMO | Source: Ambulatory Visit | Attending: Family Medicine | Admitting: Family Medicine

## 2015-01-31 ENCOUNTER — Ambulatory Visit: Payer: Commercial Managed Care - HMO | Attending: Family Medicine | Admitting: Physical Therapy

## 2015-01-31 DIAGNOSIS — M545 Low back pain, unspecified: Secondary | ICD-10-CM

## 2015-01-31 DIAGNOSIS — R6889 Other general symptoms and signs: Secondary | ICD-10-CM | POA: Diagnosis not present

## 2015-01-31 DIAGNOSIS — R0781 Pleurodynia: Secondary | ICD-10-CM | POA: Diagnosis not present

## 2015-01-31 DIAGNOSIS — M6289 Other specified disorders of muscle: Secondary | ICD-10-CM | POA: Insufficient documentation

## 2015-01-31 DIAGNOSIS — R109 Unspecified abdominal pain: Secondary | ICD-10-CM

## 2015-01-31 DIAGNOSIS — R29898 Other symptoms and signs involving the musculoskeletal system: Secondary | ICD-10-CM

## 2015-01-31 DIAGNOSIS — M25561 Pain in right knee: Secondary | ICD-10-CM | POA: Diagnosis not present

## 2015-01-31 NOTE — Therapy (Signed)
[place], [place], [address] Phone: [phone]   Fax:  [phone]  Physical Therapy Treatment  Patient Details  Name: Carrie [name] MRN: [id number] Date of Birth: 1950-05-07 Referring Provider: Cordelia Poche, MD  Encounter Date: 01/31/2015      PT End of Session - 01/31/15 1017    Visit Number 3   Number of Visits 12   Date for PT Re-Evaluation 03/03/15   PT Start Time 1015      Past Medical History  Diagnosis Date  . Hypokalemia 07/17/2011  . Hepatic steatosis 07/17/2011  . Breast cancer, right breast ([place]) 05/2009  . Hypertension   . Arthritis     "back, right hand" (07/11/2014)  . Chronic lower back pain   . Lower GI bleeding 07/11/2014 hospitalized    Past Surgical History  Procedure Laterality Date  . Breast biopsy Right 2011  . Breast lumpectomy Right 2011  . Dilation and curettage of uterus  07/2009  . Mastectomy complete / simple w/ sentinel node biopsy  05/2009    Archie Endo 05/23/2009  . Esophagogastroduodenoscopy N/A 07/12/2014    Procedure: ESOPHAGOGASTRODUODENOSCOPY (EGD);  Surgeon: Clarene Essex, MD;  Location: [facility] ENDOSCOPY;  Service: Endoscopy;  Laterality: N/A;    There were no vitals filed for this visit.  Visit Diagnosis:  Right knee pain  Weakness of both hips  Activity intolerance  Midline low back pain without sciatica      Subjective Assessment - 01/31/15 1020    Subjective No pain until yesterday.    Currently in Pain? Yes   Pain Score 6    Pain Location Knee   Pain Orientation Right   Aggravating Factors  walking   Pain Relieving Factors sitting and rest                         [facility] Adult PT Treatment/Exercise - 01/31/15 1029    Knee/Hip Exercises: Aerobic   Nustep L5 LE x 5 min    Knee/Hip Exercises: Machines for Strengthening   Cybex Leg Press 1 plate 10 x 2   Knee/Hip Exercises: Standing   Wall Squat 10 reps   Knee/Hip Exercises: Supine   Short Arc Quad Sets Right;20 reps   Short Arc Quad Sets Limitations 5#   Bridges 10 reps   Bridges with [employer] 10 reps   Bridges with [employer] 10 reps   Straight Leg Raises AROM;Strengthening;Right;10 reps   Straight Leg Raises Limitations 2# right  1 set   Knee/Hip Exercises: Prone   Hip Extension Both;10 reps   Hip Extension Limitations and 10 donkey kicks on each side   Ultrasound   Ultrasound Location medial right knee   Ultrasound Parameters 1.2 w/cm2 pulsed   Ultrasound Goals Pain   Iontophoresis   Type of Iontophoresis Dexamethasone   Location Right medial knee   Dose 1.0 cc   Time 6 hour patch                   PT Short Term Goals - 01/31/15 1019    PT SHORT TERM GOAL #1   Title Independent with inital HEP   Time 3   Period Weeks   Status On-going   PT SHORT TERM GOAL #2   Title She will report pain eased 25% in RT knee with normal home tasks   Time 3   Period Weeks   Status On-going  PT Long Term Goals - 01/31/15 1019    PT LONG TERM GOAL #1   Title She will be independent with all HEP issued as of last visit   Time 6   Period Weeks   Status On-going   PT LONG TERM GOAL #2   Title She will report 40-50% decreased RT knee pain.    Time 6   Period Weeks   Status On-going   PT LONG TERM GOAL #3   Title She will demo 4+/5 hip extenison strength to ease knee pain   Time 6   Period Weeks   Status On-going   PT LONG TERM GOAL #4   Title She will be able to  stand on one leg for  10 second s to improve walking tolerance   Time 6   Period Weeks   Status On-going               Plan - 01/31/15 1018    Clinical Impression Statement Pt reports no pain since last treatment until last night. Review of HEP and reprinted due to patient losing her copy. No increased pain with treatment today. Pt demonstrates improved strength and endurance with hip flexion exercises. She continues to demonstrate bilateral hip extension weakness  left > right.    PT Next Visit Plan cont  ionto and [place] PRN, review HEP and progress quad and hip strength, CHECK Goals        Problem List Patient Active Problem List   Diagnosis Date Noted  . Left sided abdominal pain 01/10/2015  . Epidermoid cyst of skin 01/10/2015  . Muscle cramping 12/05/2014  . Melena 07/11/2014  . UGI bleed 07/11/2014  . UGIB (upper gastrointestinal bleed) 07/11/2014  . Low back pain 06/14/2014  . Preventative health care 03/15/2014  . Diabetes mellitus screening 02/22/2014  . Bilateral knee pain 02/08/2014  . Gastritis 09/17/2013  . Right knee pain 08/04/2013  . Left knee pain 08/04/2013  . Transaminitis 07/23/2013  . Essential hypertension, benign 07/23/2013  . GERD (gastroesophageal reflux disease) 07/23/2013  . Vitamin D deficiency 07/22/2013  . Breast cancer, left breast ([place]) 07/17/2011  . FIBROIDS, UTERUS 01/30/2007  . Alcohol abuse 01/30/2007    Dorene Ar, PTA 01/31/2015, 10:53 AM  [facility] [address] [place], [place], [address] Phone: [phone]   Fax:  [phone]  Name: Carrie [name] MRN: [id number] Date of Birth: 1950-07-22

## 2015-02-03 ENCOUNTER — Ambulatory Visit: Payer: Commercial Managed Care - HMO | Admitting: Physical Therapy

## 2015-02-03 DIAGNOSIS — R6889 Other general symptoms and signs: Secondary | ICD-10-CM | POA: Diagnosis not present

## 2015-02-03 DIAGNOSIS — M6289 Other specified disorders of muscle: Secondary | ICD-10-CM | POA: Diagnosis not present

## 2015-02-03 DIAGNOSIS — R29898 Other symptoms and signs involving the musculoskeletal system: Secondary | ICD-10-CM

## 2015-02-03 DIAGNOSIS — M545 Low back pain, unspecified: Secondary | ICD-10-CM

## 2015-02-03 DIAGNOSIS — M25561 Pain in right knee: Secondary | ICD-10-CM

## 2015-02-03 NOTE — Patient Instructions (Signed)
  SINGLE LIMB STANCE   STAND NEAR STABLE OBJECT AND USE 2 FINGERS TO MAINTAIN BALANCE Stance: single leg on floor. Raise leg. Hold _30__ seconds. Repeat with other leg. _2__ reps per set, __2_ sets per day, __7_ days per week    Copyright  VHI. All rights reserved.

## 2015-02-03 NOTE — Therapy (Addendum)
[place], [place], [address] Phone: [phone]   Fax:  [phone]  Physical Therapy Treatment  Patient Details  Name: CARRELL PALMATIER MRN: [id number] Date of Birth: [birth year] Referring Provider: Cordelia Poche, MD  Encounter Date: 02/03/2015      PT End of Session - 02/03/15 1027    Visit Number 4   Number of Visits 12   Date for PT Re-Evaluation 03/03/15   PT Start Time 1017   PT Stop Time 1100   PT Time Calculation (min) 43 min      Past Medical History  Diagnosis Date  . Hypokalemia 07/17/2011  . Hepatic steatosis 07/17/2011  . Breast cancer, right breast ([place]) 05/2009  . Hypertension   . Arthritis     "back, right hand" (07/11/2014)  . Chronic lower back pain   . Lower GI bleeding 07/11/2014 hospitalized    Past Surgical History  Procedure Laterality Date  . Breast biopsy Right 2011  . Breast lumpectomy Right 2011  . Dilation and curettage of uterus  07/2009  . Mastectomy complete / simple w/ sentinel node biopsy  05/2009    Archie Endo 05/23/2009  . Esophagogastroduodenoscopy N/A 07/12/2014    Procedure: ESOPHAGOGASTRODUODENOSCOPY (EGD);  Surgeon: Clarene Essex, MD;  Location: [facility] ENDOSCOPY;  Service: Endoscopy;  Laterality: N/A;    There were no vitals filed for this visit.  Visit Diagnosis:  Right knee pain  Weakness of both hips  Activity intolerance  Midline low back pain without sciatica          [facility] PT Assessment - 02/03/15 1122    Observation/Other Assessments   Focus on Therapeutic Outcomes (FOTO)  56% limited improved from 65 % limited   Strength   Right Hip Extension 4+/5   Left Hip Extension 4-/5   Right Knee Flexion 5/5   Right Knee Extension 5/5   Left Knee Flexion 5/5   Left Knee Extension 5/5                     [facility] Adult PT Treatment/Exercise - 02/03/15 1022    Knee/Hip Exercises: Aerobic   Nustep L5 LE x 5 min    Knee/Hip Exercises: Machines for  Strengthening   Cybex Leg Press 1 plate 10 x2    Knee/Hip Exercises: Supine   Short Arc Quad Sets Right;20 reps   Short Arc Quad Sets Limitations 5#   Bridges 10 reps   Straight Leg Raises Right;15 reps   Knee/Hip Exercises: Prone   Hip Extension Both;10 reps   Ultrasound   Ultrasound Location medial right knee   Ultrasound Parameters pulsed 1.2 w/cm2    Ultrasound Goals Pain   Iontophoresis   Type of Iontophoresis Dexamethasone   Location Right medial knee   Dose 1.0 cc   Time 6 hour patch                 PT Education - 02/03/15 1136    Education provided Yes   Education Details SLS trials at [employer]) Educated Patient   Methods Explanation;Handout   Comprehension Verbalized understanding          PT Short Term Goals - 02/03/15 1028    PT SHORT TERM GOAL #1   Title Independent with inital HEP   Time 3   Period Weeks   Status Achieved   PT SHORT TERM GOAL #2   Title She will report pain eased 25% in RT knee  with normal home tasks   Time 3   Period Weeks   Status Not Met           PT Long Term Goals - 02/03/15 1032    PT LONG TERM GOAL #1   Title She will be independent with all HEP issued as of last visit   Time 6   Period Weeks   Status Achieved   PT LONG TERM GOAL #2   Title She will report 40-50% decreased RT knee pain.    Time 6   Period Weeks   Status Not Met   PT LONG TERM GOAL #3   Title She will demo 4+/5 hip extenison strength to ease knee pain   Time 6   Period Weeks   Status Partially Met   PT LONG TERM GOAL #4   Title She will be able to  stand on one leg for  10 second s to improve walking tolerance   Baseline 7 sec best bilateral   Time 6   Period Weeks   Status Not Met     G-Code:  Mobility   FOTO 56%       Goal CK  Discharge CK          Plan - 02/03/15 1126    Clinical Impression Statement Pt presents today with 5/10 pain. She reports her knee was aching after she removed the ionto patch. Previously  the treatment had helped for several days. Pt admits to only trying HEP once or twice. She feels her pain has not improved at all and is unhappy about the high copay. She would like to discharge today. Review of HEP with pt being independent. Added SLS near stable object to HEP. STG#1, LTG#1 MET, LTG# 3 partially met. Pt's         Problem List Patient Active Problem List   Diagnosis Date Noted  . Left sided abdominal pain 01/10/2015  . Epidermoid cyst of skin 01/10/2015  . Muscle cramping 12/05/2014  . Melena 07/11/2014  . UGI bleed 07/11/2014  . UGIB (upper gastrointestinal bleed) 07/11/2014  . Low back pain 06/14/2014  . Preventative health care 03/15/2014  . Diabetes mellitus screening 02/22/2014  . Bilateral knee pain 02/08/2014  . Gastritis 09/17/2013  . Right knee pain 08/04/2013  . Left knee pain 08/04/2013  . Transaminitis 07/23/2013  . Essential hypertension, benign 07/23/2013  . GERD (gastroesophageal reflux disease) 07/23/2013  . Vitamin D deficiency 07/22/2013  . Breast cancer, left breast ([place]) 07/17/2011  . FIBROIDS, UTERUS 01/30/2007  . Alcohol abuse 01/30/2007    Dorene Ar, PTA 02/03/2015, 11:37 AM  [facility] [address] [place], [place], [address] Phone: [phone]   Fax:  [phone]  Name: DELOROS BERETTA MRN: [id number] Date of Birth: [birth year]    PHYSICAL THERAPY DISCHARGE SUMMARY  Visits from Start of Care:  Current functional level related to goals / functional outcomes: See Above   Remaining deficits: Continues with pain and does not like her high copay so decided to discharge   Education / Equipment: HEP Plan: Patient agrees to discharge.  Patient goals were not met. Patient is being discharged due to lack of progress.  ?????   Darrel Hoover, PT   02/09/15   12:32 PM

## 2015-02-09 ENCOUNTER — Encounter: Payer: Self-pay | Admitting: Family Medicine

## 2015-02-09 ENCOUNTER — Ambulatory Visit (INDEPENDENT_AMBULATORY_CARE_PROVIDER_SITE_OTHER): Payer: Commercial Managed Care - HMO | Admitting: Family Medicine

## 2015-02-09 VITALS — BP 133/93 | HR 93 | Temp 98.7°F | Ht 60.0 in | Wt 178.5 lb

## 2015-02-09 DIAGNOSIS — Z23 Encounter for immunization: Secondary | ICD-10-CM | POA: Diagnosis not present

## 2015-02-09 DIAGNOSIS — I10 Essential (primary) hypertension: Secondary | ICD-10-CM

## 2015-02-09 DIAGNOSIS — F1010 Alcohol abuse, uncomplicated: Secondary | ICD-10-CM | POA: Diagnosis not present

## 2015-02-09 DIAGNOSIS — J069 Acute upper respiratory infection, unspecified: Secondary | ICD-10-CM | POA: Diagnosis not present

## 2015-02-09 DIAGNOSIS — E559 Vitamin D deficiency, unspecified: Secondary | ICD-10-CM

## 2015-02-09 MED ORDER — VITAMIN D 1000 UNITS PO TABS: 1000.0000 [IU] | ORAL_TABLET | Freq: Every day | ORAL | Status: DC

## 2015-02-09 NOTE — Patient Instructions (Signed)
Thank you for coming to see me today. It was a pleasure. Today we talked about:   High blood pressure: your blood pressure was slightly high. No changes today. You can buy a blood pressure monitor kit and check your blood pressure daily after taking your medication (ie, in the afternoon). Please take a log of your blood pressures.   Vitamin D deficiency: I will refill your vitamin D tablets  Cold: you can continue the cold and flu medication. This should get better in a few days but your cough will likely linger for a few weeks. If you start developing chest pain, coughing blood or shortness of breath, please follow-up  Please make an appointment to see me in 3 months for follow-up of hypertension and other issues.  If you have any questions or concerns, please do not hesitate to call the office at [phone].  Sincerely,  Cordelia Poche, MD

## 2015-02-09 NOTE — Assessment & Plan Note (Signed)
Patient is cutting down. I am very happy with her progress and she is taking this seriously. Discussed red flags for alcohol withdrawal. She has previously declined AA.

## 2015-02-09 NOTE — Assessment & Plan Note (Signed)
Conservative management. Red flags discussed

## 2015-02-09 NOTE — Assessment & Plan Note (Signed)
Slightly above goal today. No symptoms. Will reevaluate at next visit. Discussed getting a meter and checking at home daily.

## 2015-02-09 NOTE — Progress Notes (Signed)
    Subjective    Carrie [name] is a 64 y.o. female that presents for a follow-up visit for chronic issues.   1. Cold: Symptoms started 8 days ago. She reports productive coughing, rhinorrhea, sneezing. She reports no fevers, but has felt some chills and sweats. No associated arthralgias or myalgias. She has some rib pain from coughing. No hemoptysis, chest pain, dyspnea. No known contact with persons with tuberculosis. Her roommate is sick with similar symptoms. She is taking a "Cold and Flu" medication which is helping.  2. Hypertension: She is adherent with amlodipine 10mg  daily. She states that she has taken her medication today. No chest pain or dyspnea.  3. Vitamin D deficiency: Currently not taking vitamin D  4. Alcohol abuse: She is currently trying to cut down. She is not interested in AA but she has cut down to just two beers this week so far. She has not had any issues with withdrawal.   Social History  Substance Use Topics  . Smoking status: Never Smoker   . Smokeless tobacco: Never Used  . Alcohol Use: 12.6 oz/week    21 Cans of beer per week     Comment: 07/11/2014 "~ 3 , 12oz, beers/day"    Allergies  Allergen Reactions  . Other Other (See Comments)    Seasonal hay fever    No orders of the defined types were placed in this encounter.    ROS  Per HPI   Objective   BP 133/93 mmHg  Pulse 93  Temp(Src) 98.7 F (37.1 C) (Oral)  Ht 5' (1.524 m)  Wt 178 lb 8 oz (80.967 kg)  BMI 34.86 kg/m2  General: Well appearing, no distress HEENT: Pupils equal and reactive to light/accomodation. Extraocular movements intact bilaterally. Tympanic membranes normal bilaterally. Nares patent bilaterally with slightly edematous mucosa. Oropharnx clear and moist. No cervical adenopathy bilaterally Respiratory/Chest: Clear to auscultation bilaterally Cardiovascular: Regular rate and rhythm Neuro: Alert, oriented  Assessment and Plan    Vitamin D deficiency Refill Vitamin D  1000mg  daily  Acute upper respiratory infection Conservative management. Red flags discussed  Essential hypertension, benign Slightly above goal today. No symptoms. Will reevaluate at next visit. Discussed getting a meter and checking at home daily.

## 2015-02-09 NOTE — Assessment & Plan Note (Signed)
Refill Vitamin D 1000mg  daily

## 2015-03-13 ENCOUNTER — Ambulatory Visit (INDEPENDENT_AMBULATORY_CARE_PROVIDER_SITE_OTHER): Payer: Commercial Managed Care - HMO | Admitting: Family Medicine

## 2015-03-13 ENCOUNTER — Encounter: Payer: Self-pay | Admitting: Family Medicine

## 2015-03-13 VITALS — BP 136/84 | HR 85 | Temp 98.4°F | Ht 60.0 in | Wt 178.0 lb

## 2015-03-13 DIAGNOSIS — M25562 Pain in left knee: Secondary | ICD-10-CM | POA: Diagnosis not present

## 2015-03-13 DIAGNOSIS — I10 Essential (primary) hypertension: Secondary | ICD-10-CM

## 2015-03-13 DIAGNOSIS — M25561 Pain in right knee: Secondary | ICD-10-CM

## 2015-03-13 DIAGNOSIS — F1010 Alcohol abuse, uncomplicated: Secondary | ICD-10-CM

## 2015-03-13 MED ORDER — METHYLPREDNISOLONE ACETATE 40 MG/ML IJ SUSP: 40.0000 mg | Freq: Once | INTRAMUSCULAR | Status: DC

## 2015-03-13 NOTE — Assessment & Plan Note (Signed)
Right knee:  Informed consent obtained and placed in chart.  Time out performed.  Both Right and Left knee cleaned with iodine x 3 and wiped clear with alcohol swab.  Using 21 gauge needle 1 cc Depomedrol and 3 cc's 1% Lidocaine were injected via medial approach into joint space. Sterile bandage placed.  Patient tolerated procedure well.  No complications.    Left knee:  Informed consent obtained and placed in chart.  Time out performed.  Area cleaned with iodine x 3 and wiped clear with alcohol swab.  Using 21 gauge needle 1 cc Depomedrol and 3 cc's 1% Lidocaine were injected into joint space via medial approach.  Sterile bandage placed.  Patient tolerated procedure well.  No complications.    Not considering orthopedic referral yet as she is not currently interested in surgery

## 2015-03-13 NOTE — Addendum Note (Signed)
Addended by: Cordelia Poche A on: 03/13/2015 05:27 PM   Modules accepted: Miquel Dunn

## 2015-03-13 NOTE — Assessment & Plan Note (Signed)
Patient still drinking heavily. Counseled patient on quitting. She is gradually weaning but appears to plan on increasing consumption this holiday season. Advised against this.

## 2015-03-13 NOTE — Assessment & Plan Note (Signed)
Controlled. No changes to medication regimen 

## 2015-03-13 NOTE — Patient Instructions (Signed)
Thank you for coming to see me today. It was a pleasure. Today we talked about:   Knee pain: I gave you some injections. These should hopefully help you for a few months  Hypertension: I am rechecking your blood pressure before you leave  Alcohol: I discussed cutting down. Please try to limit yourself these holidays if you can as this will be in your health's best interest.  If you have any questions or concerns, please do not hesitate to call the office at [phone].  Sincerely,  Cordelia Poche, MD

## 2015-03-13 NOTE — Progress Notes (Signed)
    Subjective    Carrie [name] is a 64 y.o. female that presents for a follow-up visit for chronic issues.   1. Bilateral knee pain: This is a chronic issue. She has a history of osteoarthritis. She does not want to see an orthopedic surgeon because she does not currently want surgery. Pain is worst when going up stairs. Sometimes she feels like the knees will give out on her.  2. Hypertension: She is adherent with amlodipine 10mg  daily. No chest pain or dyspnea.  3. Alcohol abuse: Patient states that she is cutting down. She still requires an eye opener and states she had a beer before coming into the office. She also states that she loves beer. She states that the holidays are coming and therefor she will be drinking more beer.  Social History  Substance Use Topics  . Smoking status: Never Smoker   . Smokeless tobacco: Never Used  . Alcohol Use: 12.6 oz/week    21 Cans of beer per week     Comment: 07/11/2014 "~ 3 , 12oz, beers/day"    Allergies  Allergen Reactions  . Other Other (See Comments)    Seasonal hay fever    No orders of the defined types were placed in this encounter.    ROS  Per HPI   Objective   BP 136/84 mmHg  Pulse 85  Temp(Src) 98.4 F (36.9 C) (Oral)  Ht 5' (1.524 m)  Wt 178 lb (80.74 kg)  BMI 34.76 kg/m2  General: Well appearing, no distress Extremities: Bilateral knees with no joint line tenderness. Full ROM. Negative valgus/varus  Assessment and Plan    Bilateral knee pain Right knee:  Informed consent obtained and placed in chart.  Time out performed.  Both Right and Left knee cleaned with iodine x 3 and wiped clear with alcohol swab.  Using 21 gauge needle 1 cc Depomedrol and 3 cc's 1% Lidocaine were injected via medial approach into joint space. Sterile bandage placed.  Patient tolerated procedure well.  No complications.    Left knee:  Informed consent obtained and placed in chart.  Time out performed.  Area cleaned with iodine x 3 and wiped  clear with alcohol swab.  Using 21 gauge needle 1 cc Depomedrol and 3 cc's 1% Lidocaine were injected into joint space via medial approach.  Sterile bandage placed.  Patient tolerated procedure well.  No complications.    Not considering orthopedic referral yet as she is not currently interested in surgery  Essential hypertension, benign Controlled. No changes to medication regimen  Alcohol abuse Patient still drinking heavily. Counseled patient on quitting. She is gradually weaning but appears to plan on increasing consumption this holiday season. Advised against this.

## 2015-05-08 ENCOUNTER — Ambulatory Visit (INDEPENDENT_AMBULATORY_CARE_PROVIDER_SITE_OTHER): Payer: Commercial Managed Care - HMO | Admitting: Student

## 2015-05-08 ENCOUNTER — Encounter: Payer: Self-pay | Admitting: Student

## 2015-05-08 VITALS — BP 123/75 | HR 78 | Temp 98.1°F | Ht 60.0 in | Wt 178.4 lb

## 2015-05-08 DIAGNOSIS — M545 Low back pain, unspecified: Secondary | ICD-10-CM | POA: Insufficient documentation

## 2015-05-08 DIAGNOSIS — M546 Pain in thoracic spine: Secondary | ICD-10-CM | POA: Diagnosis not present

## 2015-05-08 DIAGNOSIS — M79674 Pain in right toe(s): Secondary | ICD-10-CM | POA: Insufficient documentation

## 2015-05-08 MED ORDER — IBUPROFEN 600 MG PO TABS: 600.0000 mg | ORAL_TABLET | Freq: Three times a day (TID) | ORAL | Status: DC | PRN

## 2015-05-08 MED ORDER — CYCLOBENZAPRINE HCL 5 MG PO TABS: 5.0000 mg | ORAL_TABLET | Freq: Three times a day (TID) | ORAL | Status: DC | PRN

## 2015-05-08 NOTE — Patient Instructions (Addendum)
It was great seeing you today! We have addressed the following issues today  1. Toe pain: I suggest trying ibuprofen 600 mg three times a day for the next 4 days, then as needed. Take it with meals.  2. Back spasm: sent a prescription for flexeril 5 mg three times a day for 5 days. This medicine may make you sleepy or drowsy. You can also try Aspercreme or heating pad for back pain. 3. You can return to clinic if the pain doesn't improve in a week or two.   If we did any lab work today, and the results require attention, either me or my nurse will get in touch with you. If everything is normal, you will get a letter in mail. If you don't hear from [place] in two weeks, please give [place] a call. Otherwise, I look forward to talking with you again at our next visit. If you have any questions or concerns before then, please call the clinic at [phone].  Please bring all your medications to every doctors visit   Sign up for My Chart to have easy access to your labs results, and communication with your Primary care physician.    Please check-out at the front desk before leaving the clinic.   Take Care,

## 2015-05-08 NOTE — Assessment & Plan Note (Signed)
No red flag. Paraspinal tenderness, more on the left than right. History of chronic back pain. -Gave flexeril 5 mg three times a day for days.  -Recommended trying Aspercreme and heating pads as well.

## 2015-05-08 NOTE — Progress Notes (Signed)
   Subjective:    Patient ID: Carrie [name], female    DOB: January 30, 1951, 65 y.o.   MRN: [id number]  HPI Right third toe pain: for two weeks. No history of trauma or fall. No swelling or erythema. No skin break. Pain is better today. Able to walk. She came to be seen because it is not going away.  Spasm: this is a chronic issue. It is diffuse over left upper quadrant and back. Has history of chronic back pain. No red flags (saddle paraesthesia, incontinence, fever or waking up at night due to pain)   Review of Systems Per HPI    Objective:   Physical Exam Filed Vitals:   05/08/15 0843  BP: 123/75  Pulse: 78  Temp: 98.1 F (36.7 C)  TempSrc: Oral  Height: 5' (1.524 m)  Weight: 178 lb 6.4 oz (80.922 kg)   Gen: appears well MSK:  RLE: third toe with full range of motion, some pain with plantar flexion, no pain with dorsal flexion, no palpable abnormality, no swelling or skin changes, good perfusion, sensation intact.  Back: some paraspinal tenderness over her back, left > right. No spinal tenderness.     Assessment & Plan:  Pain in toe of right foot Unclear etiology. Neurovascularly intact. No sign of infection or skin discoloration. This could be fracture or osteoarthritic pain but doubt the utility of x-ray as there is full range of motion with mild pain on plantar flexion.  -Pain management with tylenol. Initially gave Ibuprofen but realized that patient has history of GIB. Called and advised to stop taking ibuprofen, which could increase her risk of bleeding. -Avoid tight fitting shoes.    Back pain of thoracolumbar region No red flag. Paraspinal tenderness, more on the left than right. History of chronic back pain. -Gave flexeril 5 mg three times a day for days.  -Recommended trying Aspercreme and heating pads as well.

## 2015-05-08 NOTE — Assessment & Plan Note (Addendum)
Unclear etiology. Neurovascularly intact. No sign of infection or skin discoloration. This could be fracture or osteoarthritic pain but doubt the utility of x-ray as there is full range of motion with mild pain on plantar flexion.  -Pain management with tylenol. Initially gave Ibuprofen but realized that patient has history of GIB. Called and advised to stop taking ibuprofen, which could increase her risk of bleeding. -Avoid tight fitting shoes.

## 2015-05-29 ENCOUNTER — Other Ambulatory Visit: Payer: Self-pay | Admitting: Family Medicine

## 2015-05-29 DIAGNOSIS — M546 Pain in thoracic spine: Secondary | ICD-10-CM

## 2015-05-29 MED ORDER — CYCLOBENZAPRINE HCL 5 MG PO TABS: 5.0000 mg | ORAL_TABLET | Freq: Three times a day (TID) | ORAL | Status: DC | PRN

## 2015-05-29 NOTE — Telephone Encounter (Signed)
Would like to be prescribed muscle spasms medicine.  Was given some at her last visit with [facility]. Also needs refill on ibuprofen.

## 2015-05-29 NOTE — Telephone Encounter (Signed)
Will refill flexeril one time. Will not refill ibuprofen as I do not think this would be safe for patient as she has a history of upper GI bleed and continues to drink heavily.

## 2015-06-05 NOTE — Telephone Encounter (Signed)
Pt informed and agreeable. Josean Lycan Dawn  

## 2015-07-10 ENCOUNTER — Encounter: Payer: Self-pay | Admitting: Family Medicine

## 2015-07-10 ENCOUNTER — Ambulatory Visit (INDEPENDENT_AMBULATORY_CARE_PROVIDER_SITE_OTHER): Payer: Commercial Managed Care - HMO | Admitting: Family Medicine

## 2015-07-10 VITALS — BP 145/78 | HR 91 | Temp 97.7°F | Ht 60.0 in | Wt 180.3 lb

## 2015-07-10 DIAGNOSIS — M25561 Pain in right knee: Secondary | ICD-10-CM | POA: Diagnosis not present

## 2015-07-10 DIAGNOSIS — M25562 Pain in left knee: Secondary | ICD-10-CM

## 2015-07-10 DIAGNOSIS — D539 Nutritional anemia, unspecified: Secondary | ICD-10-CM

## 2015-07-10 DIAGNOSIS — F1010 Alcohol abuse, uncomplicated: Secondary | ICD-10-CM

## 2015-07-10 DIAGNOSIS — I10 Essential (primary) hypertension: Secondary | ICD-10-CM | POA: Diagnosis not present

## 2015-07-10 LAB — CBC WITH DIFFERENTIAL/PLATELET
BASOS PCT: 1 %
Basophils Absolute: 53 cells/uL (ref 0–200)
EOS ABS: 265 {cells}/uL (ref 15–500)
Eosinophils Relative: 5 %
HEMATOCRIT: 36.5 % (ref 35.0–45.0)
Hemoglobin: 11.9 g/dL (ref 11.7–15.5)
LYMPHS PCT: 43 %
Lymphs Abs: 2279 cells/uL (ref 850–3900)
MCH: 27.7 pg (ref 27.0–33.0)
MCHC: 32.6 g/dL (ref 32.0–36.0)
MCV: 85.1 fL (ref 80.0–100.0)
MONO ABS: 530 {cells}/uL (ref 200–950)
MONOS PCT: 10 %
MPV: 12.3 fL (ref 7.5–12.5)
NEUTROS PCT: 41 %
Neutro Abs: 2173 cells/uL (ref 1500–7800)
PLATELETS: 424 10*3/uL — AB (ref 140–400)
RBC: 4.29 MIL/uL (ref 3.80–5.10)
RDW: 17.9 % — AB (ref 11.0–15.0)
WBC: 5.3 10*3/uL (ref 3.8–10.8)

## 2015-07-10 LAB — FERRITIN: Ferritin: 20 ng/mL (ref 20–288)

## 2015-07-10 LAB — VITAMIN B12: Vitamin B-12: 430 pg/mL (ref 200–1100)

## 2015-07-10 LAB — FOLATE: Folate: 11.9 ng/mL (ref >5.4)

## 2015-07-10 MED ORDER — METHYLPREDNISOLONE ACETATE 40 MG/ML IJ SUSP
40.0000 mg | Freq: Once | INTRAMUSCULAR | Status: AC
  Administered 2015-07-10: 40 mg via INTRA_ARTICULAR

## 2015-07-10 MED ORDER — PANTOPRAZOLE SODIUM 40 MG PO TBEC: 40.0000 mg | DELAYED_RELEASE_TABLET | Freq: Every day | ORAL | Status: DC

## 2015-07-10 NOTE — Patient Instructions (Signed)
Thank you for coming to see me today. It was a pleasure. Today we talked about:   Knee pain: I have given you injections.  Alcohol abuse: We discussed quitting, which you are not ready to entertain. I am getting some lab work to check for vitamin deficiencies.  Please make an appointment to see me in 2-3 months for follow-up.  If you have any questions or concerns, please do not hesitate to call the office at [phone].  Sincerely,  Cordelia Poche, MD

## 2015-07-10 NOTE — Progress Notes (Signed)
    Subjective    Carrie [name] is a 65 y.o. female that presents for a follow-up visit for:   1. Bilateral knee pain: This is a chronic issue. She last received injections in December of 2016. She reports improvement in pain and function with her injections. She reports no falls but has decreased strength and increased pain.   2. Hypertension: She has not been adherent with her blood pressure medication as she has not had it for the past few weeks. She is supposed to be taking amlodipine 10mg  daily. No chest pain or dyspnea.  3. Alcohol dependence: She reports drinking about 8 beers per day. She reports cutting down slightly but has no intention of quitting drinking.   Social History  Substance Use Topics  . Smoking status: Never Smoker   . Smokeless tobacco: Never Used  . Alcohol Use: 12.6 oz/week    21 Cans of beer per week     Comment: 07/11/2014 "~ 3 , 12oz, beers/day"    Allergies  Allergen Reactions  . Other Other (See Comments)    Seasonal hay fever    No orders of the defined types were placed in this encounter.    ROS  Per HPI   Objective   BP 145/78 mmHg  Pulse 91  Temp(Src) 97.7 F (36.5 C) (Oral)  Ht 5' (1.524 m)  Wt 180 lb 4.8 oz (81.784 kg)  BMI 35.21 kg/m2  Vital signs reviewed  General: Well appearing, no distress Musculoskeletal: Bilateral knees with full range of motion. Mild medial joint tenderness of left knee.   Assessment and Plan    Bilateral knee pain Right knee:  Informed consent obtained and placed in chart.  Time out performed.  Both Right and Left knee cleaned with iodine x 3 and wiped clear with alcohol swab.  Using 21 gauge needle 1 cc Depomedrol and 3 cc's 1% Lidocaine were injected via medial approach into joint space. Sterile bandage placed.  Patient tolerated procedure well.  No complications.    Left knee:  Informed consent obtained and placed in chart.  Time out performed.  Area cleaned with iodine x 3 and wiped clear with alcohol  swab.  Using 21 gauge needle 1 cc Depomedrol and 3 cc's 1% Lidocaine were injected into joint space via medial approach.  Sterile bandage placed.  Patient tolerated procedure well.  No complications.    Alcohol abuse Patient continues to be in precontemplation stage. Continue to offer support. Will obtain CBC, Ferritin, Folate, Iron, B12 and B1.  Essential hypertension, benign Will refill amlodipine 10mg  daily. Recheck at next visit.

## 2015-07-11 LAB — IRON: Iron: 91 ug/dL (ref 45–160)

## 2015-07-12 MED ORDER — AMLODIPINE BESYLATE 10 MG PO TABS: 10.0000 mg | ORAL_TABLET | Freq: Every day | ORAL | Status: DC

## 2015-07-12 NOTE — Assessment & Plan Note (Addendum)
Patient continues to be in precontemplation stage. Continue to offer support. Will obtain CBC, Ferritin, Folate, Iron, B12 and B1.

## 2015-07-12 NOTE — Assessment & Plan Note (Signed)
Right knee:  Informed consent obtained and placed in chart.  Time out performed.  Both Right and Left knee cleaned with iodine x 3 and wiped clear with alcohol swab.  Using 21 gauge needle 1 cc Depomedrol and 3 cc's 1% Lidocaine were injected via medial approach into joint space. Sterile bandage placed.  Patient tolerated procedure well.  No complications.    Left knee:  Informed consent obtained and placed in chart.  Time out performed.  Area cleaned with iodine x 3 and wiped clear with alcohol swab.  Using 21 gauge needle 1 cc Depomedrol and 3 cc's 1% Lidocaine were injected into joint space via medial approach.  Sterile bandage placed.  Patient tolerated procedure well.  No complications.

## 2015-07-12 NOTE — Assessment & Plan Note (Signed)
Will refill amlodipine 10mg  daily. Recheck at next visit.

## 2015-07-15 LAB — VITAMIN B1: Vitamin B1 (Thiamine): <7 nmol/L — ABNORMAL LOW (ref 8–30)

## 2015-07-17 ENCOUNTER — Encounter: Payer: Self-pay | Admitting: Family Medicine

## 2015-07-25 NOTE — Addendum Note (Signed)
Addended by: Cordelia Poche A on: 07/25/2015 11:12 AM   Modules accepted: Miquel Dunn

## 2015-09-04 ENCOUNTER — Telehealth: Payer: Self-pay | Admitting: *Deleted

## 2015-09-04 ENCOUNTER — Ambulatory Visit (INDEPENDENT_AMBULATORY_CARE_PROVIDER_SITE_OTHER): Payer: Commercial Managed Care - HMO | Admitting: Family Medicine

## 2015-09-04 ENCOUNTER — Encounter: Payer: Self-pay | Admitting: Family Medicine

## 2015-09-04 VITALS — BP 140/78 | HR 98 | Temp 98.1°F

## 2015-09-04 DIAGNOSIS — H6980 Other specified disorders of Eustachian tube, unspecified ear: Secondary | ICD-10-CM | POA: Insufficient documentation

## 2015-09-04 DIAGNOSIS — H6981 Other specified disorders of Eustachian tube, right ear: Secondary | ICD-10-CM | POA: Diagnosis not present

## 2015-09-04 DIAGNOSIS — H6990 Unspecified Eustachian tube disorder, unspecified ear: Secondary | ICD-10-CM | POA: Insufficient documentation

## 2015-09-04 MED ORDER — FLUNISOLIDE 25 MCG/ACT (0.025%) NA SOLN: 2.0000 | Freq: Two times a day (BID) | NASAL | Status: DC

## 2015-09-04 NOTE — Telephone Encounter (Signed)
Prior Authorization received from [employer] for [employer]. Formulary placed in provider box for completion. Derl Barrow, RN

## 2015-09-04 NOTE — Patient Instructions (Signed)

## 2015-09-04 NOTE — Progress Notes (Signed)
   Subjective:   Carrie [name] is a 65 y.o. female with a history of allergic rhinitis here for ear pain  EAR PAIN  Location: r ear and radiating to throat, worse when she swallows Ear pain started: 2 weeks ago, worsening since Pain is: sharp, throbbing Severity: moderate Medications tried: none Recent ear trauma: no Prior ear surgeries: no Antibiotics in the last 30 days: no History of diabetes: no  Symptoms Ear discharge: no Fever: no Pain with chewing: wth swallowing Ringing in ears: no Dizziness: no Hearing loss: no Rashes or blisters around ear: no Weight loss: no  Review of Symptoms - see HPI PMH - Smoking status noted.    Physical: BP 140/78 mmHg  Pulse 98  Temp(Src) 98.1 F (36.7 C) (Oral)  Wt   Gen:  65 y.o. female in NAD HEENT: NCAT, MMM, anicteric sclerae, r canal with copious dark cerumen, once cleared serous middle ear effusion apparent CV: RRR, no MRG Resp: Non-labored, CTAB, no wheezes noted Abd: Soft, NTND, BS present, no guarding or organomegaly Ext: WWP, no edema Neuro: Alert and oriented, speech normal    Assessment & Plan:     Carrie [name] is a 65 y.o. female here for ear pain  Eustachian tube dysfunction R serous effusion in setting of untreated allergic rhinitis - start nasal steroid - f/u if not improved in 2 weeks    Carrie Roux, MD, MPH [facility] PGY-3 09/04/2015 3:36 PM

## 2015-09-04 NOTE — Assessment & Plan Note (Signed)
R serous effusion in setting of untreated allergic rhinitis - start nasal steroid - f/u if not improved in 2 weeks

## 2015-09-06 ENCOUNTER — Other Ambulatory Visit: Payer: Self-pay | Admitting: Family Medicine

## 2015-09-06 DIAGNOSIS — H6981 Other specified disorders of Eustachian tube, right ear: Secondary | ICD-10-CM

## 2015-09-06 MED ORDER — NASONEX 50 MCG/ACT NA SUSP: 2.0000 | Freq: Every day | NASAL | Status: DC

## 2015-09-08 ENCOUNTER — Ambulatory Visit: Payer: Commercial Managed Care - HMO | Admitting: Family Medicine

## 2015-10-24 ENCOUNTER — Ambulatory Visit: Payer: Commercial Managed Care - HMO | Admitting: Internal Medicine

## 2015-10-31 ENCOUNTER — Ambulatory Visit (INDEPENDENT_AMBULATORY_CARE_PROVIDER_SITE_OTHER): Payer: Commercial Managed Care - HMO | Admitting: *Deleted

## 2015-10-31 ENCOUNTER — Encounter: Payer: Self-pay | Admitting: *Deleted

## 2015-10-31 VITALS — BP 131/72 | HR 82 | Temp 98.0°F | Ht 61.0 in | Wt 191.4 lb

## 2015-10-31 DIAGNOSIS — Z0000 Encounter for general adult medical examination without abnormal findings: Secondary | ICD-10-CM | POA: Diagnosis not present

## 2015-10-31 NOTE — Patient Instructions (Addendum)
 Fall Prevention in the Home  Falls can cause injuries. They can happen to people of all ages. There are many things you can do to make your home safe and to help prevent falls.  WHAT CAN I DO ON THE OUTSIDE OF MY HOME?  Regularly fix the edges of walkways and driveways and fix any cracks.  Remove anything that might make you trip as you walk through a door, such as a raised step or threshold.  Trim any bushes or trees on the path to your home.  Use bright outdoor lighting.  Clear any walking paths of anything that might make someone trip, such as rocks or tools.  Regularly check to see if handrails are loose or broken. Make sure that both sides of any steps have handrails.  Any raised decks and porches should have guardrails on the edges.  Have any leaves, snow, or ice cleared regularly.  Use sand or salt on walking paths during winter.  Clean up any spills in your garage right away. This includes oil or grease spills. WHAT CAN I DO IN THE BATHROOM?   Use night lights.  Install grab bars by the toilet and in the tub and shower. Do not use towel bars as grab bars.  Use non-skid mats or decals in the tub or shower.  If you need to sit down in the shower, use a plastic, non-slip stool.  Keep the floor dry. Clean up any water that spills on the floor as soon as it happens.  Remove soap buildup in the tub or shower regularly.  Attach bath mats securely with double-sided non-slip rug tape.  Do not have throw rugs and other things on the floor that can make you trip. WHAT CAN I DO IN THE BEDROOM?  Use night lights.  Make sure that you have a light by your bed that is easy to reach.  Do not use any sheets or blankets that are too big for your bed. They should not hang down onto the floor.  Have a firm chair that has side arms. You can use this for support while you get dressed.  Do not have throw rugs and other things on the floor that can make you trip. WHAT CAN I DO  IN THE KITCHEN?  Clean up any spills right away.  Avoid walking on wet floors.  Keep items that you use a lot in easy-to-reach places.  If you need to reach something above you, use a strong step stool that has a grab bar.  Keep electrical cords out of the way.  Do not use floor polish or wax that makes floors slippery. If you must use wax, use non-skid floor wax.  Do not have throw rugs and other things on the floor that can make you trip. WHAT CAN I DO WITH MY STAIRS?  Do not leave any items on the stairs.  Make sure that there are handrails on both sides of the stairs and use them. Fix handrails that are broken or loose. Make sure that handrails are as long as the stairways.  Check any carpeting to make sure that it is firmly attached to the stairs. Fix any carpet that is loose or worn.  Avoid having throw rugs at the top or bottom of the stairs. If you do have throw rugs, attach them to the floor with carpet tape.  Make sure that you have a light switch at the top of the stairs and the bottom of the   stairs. If you do not have them, ask someone to add them for you. WHAT ELSE CAN I DO TO HELP PREVENT FALLS?  Wear shoes that:  Do not have high heels.  Have rubber bottoms.  Are comfortable and fit you well.  Are closed at the toe. Do not wear sandals.  If you use a stepladder:  Make sure that it is fully opened. Do not climb a closed stepladder.  Make sure that both sides of the stepladder are locked into place.  Ask someone to hold it for you, if possible.  Clearly mark and make sure that you can see:  Any grab bars or handrails.  First and last steps.  Where the edge of each step is.  Use tools that help you move around (mobility aids) if they are needed. These include:  Canes.  Walkers.  Scooters.  Crutches.  Turn on the lights when you go into a dark area. Replace any light bulbs as soon as they burn out.  Set up your furniture so you have a clear  path. Avoid moving your furniture around.  If any of your floors are uneven, fix them.  If there are any pets around you, be aware of where they are.  Review your medicines with your doctor. Some medicines can make you feel dizzy. This can increase your chance of falling. Ask your doctor what other things that you can do to help prevent falls.   This information is not intended to replace advice given to you by your health care provider. Make sure you discuss any questions you have with your health care provider.   Document Released: 01/12/2009 Document Revised: 08/02/2014 Document Reviewed: 04/22/2014 Elsevier Interactive Patient Education 2016 [employer]  Health Maintenance, Female Adopting a healthy lifestyle and getting preventive care can go a long way to promote health and wellness. Talk with your health care provider about what schedule of regular examinations is right for you. This is a good chance for you to check in with your provider about disease prevention and staying healthy. In between checkups, there are plenty of things you can do on your own. Experts have done a lot of research about which lifestyle changes and preventive measures are most likely to keep you healthy. Ask your health care provider for more information. WEIGHT AND DIET  Eat a healthy diet  Be sure to include plenty of vegetables, fruits, low-fat dairy products, and lean protein.  Do not eat a lot of foods high in solid fats, added sugars, or salt.  Get regular exercise. This is one of the most important things you can do for your health.  Most adults should exercise for at least 150 minutes each week. The exercise should increase your heart rate and make you sweat (moderate-intensity exercise).  Most adults should also do strengthening exercises at least twice a week. This is in addition to the moderate-intensity exercise.  Maintain a healthy weight  Body mass index (BMI) is a measurement that can  be used to identify possible weight problems. It estimates body fat based on height and weight. Your health care provider can help determine your BMI and help you achieve or maintain a healthy weight.  For females 20 years of age and older:   A BMI below 18.5 is considered underweight.  A BMI of 18.5 to 24.9 is normal.  A BMI of 25 to 29.9 is considered overweight.  A BMI of 30 and above is considered obese.  Watch levels   of cholesterol and blood lipids  You should start having your blood tested for lipids and cholesterol at 63 years of age, then have this test every 5 years.  You may need to have your cholesterol levels checked more often if:  Your lipid or cholesterol levels are high.  You are older than 63 years of age.  You are at high risk for heart disease.  CANCER SCREENING   Lung Cancer  Lung cancer screening is recommended for adults 53-62 years old who are at high risk for lung cancer because of a history of smoking.  A yearly low-dose CT scan of the lungs is recommended for people who:  Currently smoke.  Have quit within the past 15 years.  Have at least a 30-pack-year history of smoking. A pack year is smoking an average of one pack of cigarettes a day for 1 year.  Yearly screening should continue until it has been 15 years since you quit.  Yearly screening should stop if you develop a health problem that would prevent you from having lung cancer treatment.  Breast Cancer  Practice breast self-awareness. This means understanding how your breasts normally appear and feel.  It also means doing regular breast self-exams. Let your health care provider know about any changes, no matter how small.  If you are in your 20s or 30s, you should have a clinical breast exam (CBE) by a health care provider every 1-3 years as part of a regular health exam.  If you are 4 or older, have a CBE every year. Also consider having a breast X-ray (mammogram) every year.  If  you have a family history of breast cancer, talk to your health care provider about genetic screening.  If you are at high risk for breast cancer, talk to your health care provider about having an MRI and a mammogram every year.  Breast cancer gene (BRCA) assessment is recommended for women who have family members with BRCA-related cancers. BRCA-related cancers include:  Breast.  Ovarian.  Tubal.  Peritoneal cancers.  Results of the assessment will determine the need for genetic counseling and BRCA1 and BRCA2 testing. Cervical Cancer Your health care provider may recommend that you be screened regularly for cancer of the pelvic organs (ovaries, uterus, and vagina). This screening involves a pelvic examination, including checking for microscopic changes to the surface of your cervix (Pap test). You may be encouraged to have this screening done every 3 years, beginning at age 75.  For women ages 5-65, health care providers may recommend pelvic exams and Pap testing every 3 years, or they may recommend the Pap and pelvic exam, combined with testing for human papilloma virus (HPV), every 5 years. Some types of HPV increase your risk of cervical cancer. Testing for HPV may also be done on women of any age with unclear Pap test results.  Other health care providers may not recommend any screening for nonpregnant women who are considered low risk for pelvic cancer and who do not have symptoms. Ask your health care provider if a screening pelvic exam is right for you.  If you have had past treatment for cervical cancer or a condition that could lead to cancer, you need Pap tests and screening for cancer for at least 20 years after your treatment. If Pap tests have been discontinued, your risk factors (such as having a new sexual partner) need to be reassessed to determine if screening should resume. Some women have medical problems that increase the chance  of getting cervical cancer. In these cases,  your health care provider may recommend more frequent screening and Pap tests. Colorectal Cancer  This type of cancer can be detected and often prevented.  Routine colorectal cancer screening usually begins at 63 years of age and continues through 63 years of age.  Your health care provider may recommend screening at an earlier age if you have risk factors for colon cancer.  Your health care provider may also recommend using home test kits to check for hidden blood in the stool.  A small camera at the end of a tube can be used to examine your colon directly (sigmoidoscopy or colonoscopy). This is done to check for the earliest forms of colorectal cancer.  Routine screening usually begins at age 50.  Direct examination of the colon should be repeated every 5-10 years through 63 years of age. However, you may need to be screened more often if early forms of precancerous polyps or small growths are found. Skin Cancer  Check your skin from head to toe regularly.  Tell your health care provider about any new moles or changes in moles, especially if there is a change in a mole's shape or color.  Also tell your health care provider if you have a mole that is larger than the size of a pencil eraser.  Always use sunscreen. Apply sunscreen liberally and repeatedly throughout the day.  Protect yourself by wearing long sleeves, pants, a wide-brimmed hat, and sunglasses whenever you are outside. HEART DISEASE, DIABETES, AND HIGH BLOOD PRESSURE   High blood pressure causes heart disease and increases the risk of stroke. High blood pressure is more likely to develop in:  People who have blood pressure in the high end of the normal range (130-139/85-89 mm Hg).  People who are overweight or obese.  People who are African American.  If you are 18-39 years of age, have your blood pressure checked every 3-5 years. If you are 40 years of age or older, have your blood pressure checked every year. You  should have your blood pressure measured twice--once when you are at a hospital or clinic, and once when you are not at a hospital or clinic. Record the average of the two measurements. To check your blood pressure when you are not at a hospital or clinic, you can use:  An automated blood pressure machine at a pharmacy.  A home blood pressure monitor.  If you are between 55 years and 79 years old, ask your health care provider if you should take aspirin to prevent strokes.  Have regular diabetes screenings. This involves taking a blood sample to check your fasting blood sugar level.  If you are at a normal weight and have a low risk for diabetes, have this test once every three years after 63 years of age.  If you are overweight and have a high risk for diabetes, consider being tested at a younger age or more often. PREVENTING INFECTION  Hepatitis B  If you have a higher risk for hepatitis B, you should be screened for this virus. You are considered at high risk for hepatitis B if:  You were born in a country where hepatitis B is common. Ask your health care provider which countries are considered high risk.  Your parents were born in a high-risk country, and you have not been immunized against hepatitis B (hepatitis B vaccine).  You have HIV or AIDS.  You use needles to inject street drugs.  You   live with someone who has hepatitis B.  You have had sex with someone who has hepatitis B.  You get hemodialysis treatment.  You take certain medicines for conditions, including cancer, organ transplantation, and autoimmune conditions. Hepatitis C  Blood testing is recommended for:  Everyone born from 1945 through 1965.  Anyone with known risk factors for hepatitis C. Sexually transmitted infections (STIs)  You should be screened for sexually transmitted infections (STIs) including gonorrhea and chlamydia if:  You are sexually active and are younger than 63 years of age.  You  are older than 63 years of age and your health care provider tells you that you are at risk for this type of infection.  Your sexual activity has changed since you were last screened and you are at an increased risk for chlamydia or gonorrhea. Ask your health care provider if you are at risk.  If you do not have HIV, but are at risk, it may be recommended that you take a prescription medicine daily to prevent HIV infection. This is called pre-exposure prophylaxis (PrEP). You are considered at risk if:  You are sexually active and do not regularly use condoms or know the HIV status of your partner(s).  You take drugs by injection.  You are sexually active with a partner who has HIV. Talk with your health care provider about whether you are at high risk of being infected with HIV. If you choose to begin PrEP, you should first be tested for HIV. You should then be tested every 3 months for as long as you are taking PrEP.  PREGNANCY   If you are premenopausal and you may become pregnant, ask your health care provider about preconception counseling.  If you may become pregnant, take 400 to 800 micrograms (mcg) of folic acid every day.  If you want to prevent pregnancy, talk to your health care provider about birth control (contraception). OSTEOPOROSIS AND MENOPAUSE   Osteoporosis is a disease in which the bones lose minerals and strength with aging. This can result in serious bone fractures. Your risk for osteoporosis can be identified using a bone density scan.  If you are 65 years of age or older, or if you are at risk for osteoporosis and fractures, ask your health care provider if you should be screened.  Ask your health care provider whether you should take a calcium or vitamin D supplement to lower your risk for osteoporosis.  Menopause may have certain physical symptoms and risks.  Hormone replacement therapy may reduce some of these symptoms and risks. Talk to your health care  provider about whether hormone replacement therapy is right for you.  HOME CARE INSTRUCTIONS   Schedule regular health, dental, and eye exams.  Stay current with your immunizations.   Do not use any tobacco products including cigarettes, chewing tobacco, or electronic cigarettes.  If you are pregnant, do not drink alcohol.  If you are breastfeeding, limit how much and how often you drink alcohol.  Limit alcohol intake to no more than 1 drink per day for nonpregnant women. One drink equals 12 ounces of beer, 5 ounces of wine, or 1 ounces of hard liquor.  Do not use street drugs.  Do not share needles.  Ask your health care provider for help if you need support or information about quitting drugs.  Tell your health care provider if you often feel depressed.  Tell your health care provider if you have ever been abused or do not feel safe   at home.   This information is not intended to replace advice given to you by your health care provider. Make sure you discuss any questions you have with your health care provider.   Document Released: 10/01/2010 Document Revised: 04/08/2014 Document Reviewed: 02/17/2013 Elsevier Interactive Patient Education 2016 Elsevier Inc.  

## 2015-10-31 NOTE — Progress Notes (Signed)
Subjective:   Carrie [name] is a 65 y.o. female who presents for an Initial Medicare Annual Wellness Visit.   Cardiac Risk Factors include: hypertension;obesity (BMI >30kg/m2);sedentary lifestyle     Objective:    Today's Vitals   10/31/15 0912  BP: 131/72  Pulse: 82  Temp: 98 F (36.7 C)  SpO2: 100%  Weight: 191 lb 6.4 oz (86.8 kg)  Height: 5\' 1"  (1.549 m)  PainSc: 0-No pain   Body mass index is 36.16 kg/m.   Current Medications (verified) Outpatient Encounter Prescriptions as of 10/31/2015  Medication Sig  . amLODipine (NORVASC) 10 MG tablet Take 1 tablet (10 mg total) by mouth daily.  . pantoprazole (PROTONIX) 40 MG tablet Take 1 tablet (40 mg total) by mouth daily.  . cholecalciferol (VITAMIN D) 1000 UNITS tablet Take 1 tablet (1,000 Units total) by mouth daily. (Patient not taking: Reported on 10/31/2015)  . cyclobenzaprine (FLEXERIL) 5 MG tablet Take 1 tablet (5 mg total) by mouth 3 (three) times daily as needed for muscle spasms. (Patient not taking: Reported on 10/31/2015)  . NASONEX 50 MCG/ACT nasal spray Place 2 sprays into the nose daily. (Patient not taking: Reported on 10/31/2015)   No facility-administered encounter medications on file as of 10/31/2015.     Allergies (verified) Other   History: Past Medical History:  Diagnosis Date  . Arthritis    "back, right hand" (07/11/2014)  . Breast cancer, right breast ([place]) 05/2009  . Chronic lower back pain   . Hepatic steatosis 07/17/2011  . Hypertension   . Hypokalemia 07/17/2011  . Lower GI bleeding 07/11/2014 hospitalized   Past Surgical History:  Procedure Laterality Date  . BREAST BIOPSY Right 2011  . BREAST LUMPECTOMY Right 2011  . [place] OF UTERUS  07/2009  . ESOPHAGOGASTRODUODENOSCOPY N/A 07/12/2014   Procedure: ESOPHAGOGASTRODUODENOSCOPY (EGD);  Surgeon: Clarene Essex, MD;  Location: [facility];  Service: Endoscopy;  Laterality: N/A;  . MASTECTOMY COMPLETE / SIMPLE W/ SENTINEL NODE BIOPSY   05/2009   Archie Endo 05/23/2009   Family History  Problem Relation Age of Onset  . Cancer Other   . Diabetes Mother   . Hypertension Mother   . Diabetes Brother   . Hypertension Father   . Arthritis/Rheumatoid Father   . Cancer Maternal Grandfather   . Heart disease Brother    Social History   Occupational History  . Not on file.   Social History Main Topics  . Smoking status: Never Smoker  . Smokeless tobacco: Never Used  . Alcohol use 17.4 oz/week    24 Cans of beer, 5 Shots of liquor per week     Comment: 07/11/2014 "~ 3 , 12oz, beers/day"  . Drug use: No  . Sexual activity: Not Currently    Birth control/ protection: Post-menopausal    Tobacco Counseling Counseling given: Yes   Activities of Daily Living In your present state of health, do you have any difficulty performing the following activities: 10/31/2015  Hearing? N  Vision? N  Difficulty concentrating or making decisions? Y  Walking or climbing stairs? Y  Dressing or bathing? N  Doing errands, shopping? N  Preparing Food and eating ? N  Using the Toilet? N  In the past six months, have you accidently leaked urine? Y  Do you have problems with loss of bowel control? N  Managing your Medications? N  Managing your Finances? N  Housekeeping or managing your Housekeeping? N  Some recent data might be hidden  Home Safety:  My home has a working smoke alarm:  Yes           My home throw rugs have been fastened down to the floor or removed:  Removed I have non-slip mats in the bathtub and shower:  Yes         All my home's stairs have railings or bannisters: Yes, 7 outside steps with railings         My home's floors, stairs and hallways are free from clutter, wires and cords:  Yes        Immunizations and Health Maintenance Immunization History  Administered Date(s) Administered  . Influenza,inj,Quad PF,36+ Mos 03/15/2014, 02/09/2015  . Pneumococcal Polysaccharide-23 07/13/2014  . Td 02/05/1999  . Tdap  03/15/2014   Health Maintenance Due  Topic Date Due  . ZOSTAVAX  09/25/2012  . INFLUENZA VACCINE  10/31/2015  Patient will obtain zostavax at [facility]  Patient Care Team: Janora Norlander, DO as PCP - General (Family Medicine)  Indicate any recent Medical Services you may have received from other than [facility] providers in the past year (date may be approximate).     Assessment:   This is a routine wellness examination for Carrie [name].   Dietary issues and exercise activities discussed: Current Exercise Habits: The patient does not participate in regular exercise at present, Exercise limited by: orthopedic condition(s) (low back pain)  Goals    . Blood Pressure < 140/90    . Weight (lb) < 178 lb (80.7 kg)          7% weight loss       Depression Screen PHQ 2/9 Scores 10/31/2015 07/10/2015 05/08/2015 03/13/2015 02/09/2015 01/10/2015 01/10/2015  PHQ - 2 Score 0 0 0 0 0 0 0    Fall Risk Fall Risk  10/31/2015 05/08/2015 03/13/2015 07/20/2014 07/11/2014  Falls in the past year? No No No No No  Number falls in past yr: - - - - -  Risk Factor Category  - - - - -    Cognitive Function: Mini-Cog passed with score 3/5  TUG Test:  Done in 12 seconds. Patient used both hands to push out of chair and to sit back down.  Screening Tests Health Maintenance  Topic Date Due  . ZOSTAVAX  09/25/2012  . INFLUENZA VACCINE  10/31/2015  . MAMMOGRAM  09/11/2016  . PAP SMEAR  03/15/2017  . COLONOSCOPY  08/28/2022  . TETANUS/TDAP  03/15/2024  . Hepatitis C Screening  Completed  . HIV Screening  Completed  Patient will obtain zostavax vaccine at local pharmacy    Plan:     During the course of the visit, Patience was educated and counseled about the following appropriate screening and preventive services:   Vaccines to include Pneumoccal, Influenza, Td, Zostavax  Cardiovascular disease screening  Colorectal cancer screening  Bone density screening  Diabetes  screening  Mammography/PAP  Nutrition counseling  Patient Instructions (the written plan) were given to the patient.    Velora Heckler, RN   10/31/2015    I have reviewed this visit and discussed with Howell Rucks, RN, BSN, and agree with her documentation.   Ashly M. Lajuana Ripple, DO PGY-3, [facility]

## 2015-11-01 ENCOUNTER — Encounter: Payer: Self-pay | Admitting: *Deleted

## 2015-11-06 ENCOUNTER — Ambulatory Visit: Payer: Commercial Managed Care - HMO | Admitting: Family Medicine

## 2015-11-27 ENCOUNTER — Encounter: Payer: Self-pay | Admitting: Family Medicine

## 2015-11-27 ENCOUNTER — Ambulatory Visit (INDEPENDENT_AMBULATORY_CARE_PROVIDER_SITE_OTHER): Payer: Commercial Managed Care - HMO | Admitting: Family Medicine

## 2015-11-27 VITALS — BP 136/78 | HR 94 | Temp 97.7°F | Ht 61.0 in | Wt 190.4 lb

## 2015-11-27 DIAGNOSIS — M546 Pain in thoracic spine: Secondary | ICD-10-CM

## 2015-11-27 DIAGNOSIS — Z1239 Encounter for other screening for malignant neoplasm of breast: Secondary | ICD-10-CM | POA: Diagnosis not present

## 2015-11-27 MED ORDER — CYCLOBENZAPRINE HCL 5 MG PO TABS: 2.5000 mg | ORAL_TABLET | Freq: Three times a day (TID) | ORAL | 0 refills | Status: DC | PRN

## 2015-11-27 NOTE — Patient Instructions (Signed)
I have placed the order for your mammogram and a referral to orthopedics for back injections.     Back Pain, Adult Back pain is very common in adults.The cause of back pain is rarely dangerous and the pain often gets better over time.The cause of your back pain may not be known. Some common causes of back pain include:  Strain of the muscles or ligaments supporting the spine.  Wear and tear (degeneration) of the spinal disks.  Arthritis.  Direct injury to the back. For many people, back pain may return. Since back pain is rarely dangerous, most people can learn to manage this condition on their own. HOME CARE INSTRUCTIONS Watch your back pain for any changes. The following actions may help to lessen any discomfort you are feeling:  Remain active. It is stressful on your back to sit or stand in one place for long periods of time. Do not sit, drive, or stand in one place for more than 30 minutes at a time. Take short walks on even surfaces as soon as you are able.Try to increase the length of time you walk each day.  Exercise regularly as directed by your health care provider. Exercise helps your back heal faster. It also helps avoid future injury by keeping your muscles strong and flexible.  Do not stay in bed.Resting more than 1-2 days can delay your recovery.  Pay attention to your body when you bend and lift. The most comfortable positions are those that put less stress on your recovering back. Always use proper lifting techniques, including:  Bending your knees.  Keeping the load close to your body.  Avoiding twisting.  Find a comfortable position to sleep. Use a firm mattress and lie on your side with your knees slightly bent. If you lie on your back, put a pillow under your knees.  Avoid feeling anxious or stressed.Stress increases muscle tension and can worsen back pain.It is important to recognize when you are anxious or stressed and learn ways to manage it, such as with  exercise.  Take medicines only as directed by your health care provider. Over-the-counter medicines to reduce pain and inflammation are often the most helpful.Your health care provider may prescribe muscle relaxant drugs.These medicines help dull your pain so you can more quickly return to your normal activities and healthy exercise.  Apply ice to the injured area:  Put ice in a plastic bag.  Place a towel between your skin and the bag.  Leave the ice on for 20 minutes, 2-3 times a day for the first 2-3 days. After that, ice and heat may be alternated to reduce pain and spasms.  Maintain a healthy weight. Excess weight puts extra stress on your back and makes it difficult to maintain good posture. SEEK MEDICAL CARE IF:  You have pain that is not relieved with rest or medicine.  You have increasing pain going down into the legs or buttocks.  You have pain that does not improve in one week.  You have night pain.  You lose weight.  You have a fever or chills. SEEK IMMEDIATE MEDICAL CARE IF:   You develop new bowel or bladder control problems.  You have unusual weakness or numbness in your arms or legs.  You develop nausea or vomiting.  You develop abdominal pain.  You feel faint.   This information is not intended to replace advice given to you by your health care provider. Make sure you discuss any questions you have with your  health care provider.   Document Released: 03/18/2005 Document Revised: 04/08/2014 Document Reviewed: 07/20/2013 Elsevier Interactive Patient Education [employer].

## 2015-11-27 NOTE — Progress Notes (Addendum)
    Subjective: CC: back pain HPI: Patient is a 65 y.o. female presenting to clinic today for back pain. Concerns today include:  1. Back Pain Patient reports that pain began about 7 years ago.  Pain is a 7/10 when she is seated and a 10/10 with standing.  It does not radiate.  Standing/ walking worsens pain.  Sitting improves pain.  Patient has been taking muscle relaxer (her neighbor's medication) for pain with good relief.  Patient denies trauma or injury.  Has had back injection >5 years ago with ortho, which improved the pain for a long while.  Denies saddle anesthesia, urinary retention/incontinence, bowel incontinence, weakness, falls, sensation changes, pain elsewhere.  History Reviewed. Health Maintenance: Zostavax due  ROS: All other systems reviewed and are negative.  Objective: Office vital signs reviewed. BP 136/78   Pulse 94   Temp 97.7 F (36.5 C) (Oral)   Ht 5\' 1"  (1.549 m)   Wt 190 lb 6.4 oz (86.4 kg)   BMI 35.98 kg/m   Physical Examination:  General: Awake, alert, well nourished, NAD Extremities: WWP, No edema, cyanosis or clubbing; +2 pulses bilaterally MSK: antalgic/ stiff gait and station  Spine: decreased AROM in rotation, +pain w/ AROM in extension, painless AROM flexion and side bending, no midline TTP, +right sided paraspinal TTP, no palpable bony deformities Neuro: Strength and sensation grossly intact, patellar DTRs 1/4, normal heel to toe, normal heel walk, normal tip toe walk  Assessment/ Plan: 65 y.o. female   1. Bilateral thoracic back pain.  Discussed NOT taking her neighbor's medication.  No red flags. 07/2014 lumbar xray reviewed, DDD and spondylosis of the lumbosacral spine. Increased anterolisthesis  in L5-S1 compared to previous studies. - Home exercise handout reviewed for LBP - cyclobenzaprine (FLEXERIL) 5 MG tablet; Take 0.5-1 tablets (2.5-5 mg total) by mouth 3 (three) times daily as needed for muscle spasms.  Dispense: 30 tablet; Refill:  0 - Ambulatory referral to Orthopedic Surgery.  Hopefully, can offer back injections.  2. Screening for breast cancer - screening mammogram ordered  Follow up prn or in 1 year for annual exam.  Janora Norlander, DO PGY-3, [place]

## 2015-12-06 ENCOUNTER — Other Ambulatory Visit: Payer: Self-pay | Admitting: Family Medicine

## 2015-12-06 DIAGNOSIS — N63 Unspecified lump in unspecified breast: Secondary | ICD-10-CM

## 2015-12-18 ENCOUNTER — Ambulatory Visit
Admission: RE | Admit: 2015-12-18 | Discharge: 2015-12-18 | Disposition: A | Discharge status: Home / Self Care | Payer: Commercial Managed Care - HMO | Source: Ambulatory Visit | Attending: Family Medicine | Admitting: Family Medicine

## 2015-12-18 DIAGNOSIS — R922 Inconclusive mammogram: Secondary | ICD-10-CM | POA: Diagnosis not present

## 2015-12-18 DIAGNOSIS — N63 Unspecified lump in unspecified breast: Secondary | ICD-10-CM

## 2015-12-25 ENCOUNTER — Encounter: Payer: Self-pay | Admitting: Student

## 2015-12-25 ENCOUNTER — Ambulatory Visit (INDEPENDENT_AMBULATORY_CARE_PROVIDER_SITE_OTHER): Payer: Commercial Managed Care - HMO | Admitting: Student

## 2015-12-25 DIAGNOSIS — L02232 Carbuncle of back [any part, except buttock]: Secondary | ICD-10-CM

## 2015-12-25 DIAGNOSIS — I10 Essential (primary) hypertension: Secondary | ICD-10-CM | POA: Diagnosis not present

## 2015-12-25 DIAGNOSIS — L02222 Furuncle of back [any part, except buttock]: Secondary | ICD-10-CM

## 2015-12-25 NOTE — Patient Instructions (Signed)
It was great seeing you today! We have addressed the following issues today  1. Abscess: We have drained the abscess today. Come back for stitch removal in 5-6 days.     If we did any lab work today, and the results require attention, either me or my nurse will get in touch with you. If everything is normal, you will get a letter in mail. If you don't hear from [place] in two weeks, please give [place] a call. Otherwise, I look forward to talking with you again at our next visit. If you have any questions or concerns before then, please call the clinic at [phone].  Please bring all your medications to every doctors visit   Sign up for My Chart to have easy access to your labs results, and communication with your Primary care physician.    Please check-out at the front desk before leaving the clinic.   Take Care,

## 2015-12-25 NOTE — Progress Notes (Signed)
   Subjective:    Patient ID: Carrie [name] is a 65 y.o. old female.  HPI #Bump on her back: for one month. She had similar bump in the past that was removed. Pain 12/10 yesterday and 8/10 today. No pain medicine. No drainage. Denies fever or chills.   PMH: reviewed  Review of Systems Per HPI Objective:   Vitals:   12/25/15 1543  BP: (!) 143/96  Pulse: (!) 108  Temp: 97.7 F (36.5 C)  TempSrc: Oral  Weight: 189 lb 12.8 oz (86.1 kg)  Height: 5\' 1"  (1.549 m)    GEN: appears well, No apparent distress RESP: no increased work of breathing SKIN: A small cystic/nodular swelling over her mid thoracic area, about 1 inch in diameter, mobile, no overlying skin erythema. It is tender to palpation. A small horizontal scar just below the swelling likely site of previous incision NEURO: alert and oriented appropriately, no gross defecits  PSYCH: appropriate mood and affect   Procedure: Discussed with the patient about the risk and benefits of the procedure. The area is prepped and draped in sterile fashion. Anesthestized with 1% lidocaine using 27 gauge, 1.5 inch needle. Made a vertical incision about 2 cm long using a 15 gauge scalpel. Purulent material was drained. The incision site was closed by primary closure using 3 interrupted sutures using 4.0  nylon suture. There is no complication after the procedure. Hemostasis was attained. The wound covered by 4x4 cotton gauze. Patient tolerated the procedure well. Patient was advised to return in 5-6 days for stitch removal.  Assessment & Plan:  Boil, back Incision and drainage in the office. Patient tolerated the procedure well. Incision was closed with 3 stitches. Discussed return precautions. Patient to return for stitch removal in 5-6 days. Tylenol as needed for pain.  Essential hypertension, benign Noted that her blood pressure is 146/96 (mildly elevated). She is also tachycardic. These are all likely autonomic response to her pain.  Follow-up with PCP

## 2015-12-25 NOTE — Assessment & Plan Note (Signed)
Incision and drainage in the office. Patient tolerated the procedure well. Incision was closed with 3 stitches. Discussed return precautions. Patient to return for stitch removal in 5-6 days. Tylenol as needed for pain.

## 2015-12-25 NOTE — Assessment & Plan Note (Signed)
Noted that her blood pressure is 146/96 (mildly elevated). She is also tachycardic. These are all likely autonomic response to her pain. Follow-up with PCP

## 2016-01-01 ENCOUNTER — Ambulatory Visit: Payer: Commercial Managed Care - HMO | Admitting: *Deleted

## 2016-01-01 DIAGNOSIS — L02222 Furuncle of back [any part, except buttock]: Secondary | ICD-10-CM

## 2016-01-01 MED ORDER — IBUPROFEN 200 MG PO TABS: 200.0000 mg | ORAL_TABLET | Freq: Once | ORAL | Status: DC

## 2016-01-01 MED ORDER — IBUPROFEN 200 MG PO TABS
800.0000 mg | ORAL_TABLET | Freq: Once | ORAL | Status: AC
  Administered 2016-01-01: 800 mg via ORAL

## 2016-01-01 MED ORDER — DOXYCYCLINE HYCLATE 100 MG PO TABS: 100.0000 mg | ORAL_TABLET | Freq: Two times a day (BID) | ORAL | 0 refills | Status: DC

## 2016-01-01 MED ORDER — IBUPROFEN 800 MG PO TABS: 800.0000 mg | ORAL_TABLET | Freq: Three times a day (TID) | ORAL | 1 refills | Status: DC | PRN

## 2016-01-01 NOTE — Progress Notes (Signed)
Patient returned today for suture removal.  Had I & D performed on 9/25.  She's done well since then.  I'm preceptor for today and was called in to evaluate area.  3 sutures already clipped by Levert Feinstein.  Area with some induration and erythema plus warmth.  Wound had not completely healed and I was able to squeeze fair amount of first purulent fluid followed by several CC's of serosanguinous fluid from area of dehiscence at most inferior aspect of prior incision, which at its longest was 0.5 cm in length.    After expressing the fluid, I then packed the wound with iodoform packing and left it open.  She tolerated procedure well, though she was not anesthetized for packing and she was sore afterwards.  Plan to treat with 800 mg Ibuprofen analgesia here plus at home as needed.  Will also need to treat with at least 7 day course of abx covering MRSA.  Will send these in now.   FU in 2 days for re-eval.  May need to be repacked at that point versus repeat I & D if purulence continues to build.  Do not re-suture but allow to heal by secondary intention.

## 2016-01-03 ENCOUNTER — Ambulatory Visit (INDEPENDENT_AMBULATORY_CARE_PROVIDER_SITE_OTHER): Payer: Commercial Managed Care - HMO | Admitting: Student

## 2016-01-03 DIAGNOSIS — L02232 Carbuncle of back [any part, except buttock]: Secondary | ICD-10-CM

## 2016-01-03 DIAGNOSIS — L02222 Furuncle of back [any part, except buttock]: Secondary | ICD-10-CM

## 2016-01-03 NOTE — Progress Notes (Signed)
   Subjective:    Patient ID: Carrie [name] is a 65 y.o. old female.  HPI #Abscess: Status post I&D about a week ago with recollection of purulent material when she came back for follow-up about 2 days ago needing drainage, packing with gauze and antibiotic treatment with doxycycline. She reports itching but denies fever or pain. She is still taking the doxycycline.    PMH: reviewed  Review of Systems Per HPI Objective:   Vitals:   01/03/16 0947  BP: 133/70  Pulse: 96  Temp: 98.2 F (36.8 C)  TempSrc: Oral  Weight: 192 lb 6.4 oz (87.3 kg)  Height: 5\' 1"  (1.549 m)    GEN: appears well, no apparent distress. RESP: no increased work of breathing GI: Bowel sounds present and normal, soft, non-tender,non-distended SKIN: Wound healing properly. There is a remaining open incision site about 0.5 cm inferiorly. Very little serosanguineous to purulent fluid when squeezed. Patient felt pressure but no pain with this. Wasn't able to see the packing in the wound. Appears the packing has fallen  out with the dressing.      NEURO: alert and oriented appropriately, no gross defecits  PSYCH: appropriate mood and affect     Assessment & Plan:  Boil, back Healing well. No systemic symptoms or sign and symptoms of cellulitis. Gauze pack is already out. She still taking doxycycline. Recommended follow-up with the nurse in a week or early if needed.   Patient was precepted with Dr. Gwendlyn Deutscher.

## 2016-01-03 NOTE — Assessment & Plan Note (Signed)
Healing well. No systemic symptoms or sign and symptoms of cellulitis. Gauze pack is already out. She still taking doxycycline. Recommended follow-up with the nurse in a week or early if needed.

## 2016-01-03 NOTE — Patient Instructions (Signed)
It was great seeing you today! We have addressed the following issues today  1. Abscess/boil: The wound is healing well. It appears that gauze pack has already came out. I recommend follow-up with a nurse in a week or earlier if needed.    If we did any lab work today, and the results require attention, either me or my nurse will get in touch with you. If everything is normal, you will get a letter in mail. If you don't hear from [place] in two weeks, please give [place] a call. Otherwise, I look forward to talking with you again at our next visit. If you have any questions or concerns before then, please call the clinic at [phone].  Please bring all your medications to every doctors visit   Sign up for My Chart to have easy access to your labs results, and communication with your Primary care physician.    Please check-out at the front desk before leaving the clinic.   Take Care,

## 2016-01-08 ENCOUNTER — Other Ambulatory Visit: Payer: Self-pay | Admitting: *Deleted

## 2016-01-08 MED ORDER — PANTOPRAZOLE SODIUM 40 MG PO TBEC: 40.0000 mg | DELAYED_RELEASE_TABLET | Freq: Every day | ORAL | 3 refills | Status: DC

## 2016-01-10 ENCOUNTER — Ambulatory Visit (INDEPENDENT_AMBULATORY_CARE_PROVIDER_SITE_OTHER): Payer: Commercial Managed Care - HMO | Admitting: *Deleted

## 2016-01-10 DIAGNOSIS — L02222 Furuncle of back [any part, except buttock]: Secondary | ICD-10-CM

## 2016-01-10 DIAGNOSIS — L02232 Carbuncle of back [any part, except buttock]: Secondary | ICD-10-CM

## 2016-01-10 DIAGNOSIS — Z5189 Encounter for other specified aftercare: Secondary | ICD-10-CM

## 2016-01-10 NOTE — Progress Notes (Signed)
I am precepting today, was asked to examine patient's back during nurse visit for wound recheck.  Patient underwent I&D of back abscess on 9/25 here at [facility] by Dr. Cyndia Skeeters. Was closed with stitches.  Followed up on 10/2 for suture removal, at which time purulence, warmth, and induration noted along with continued purulent drainage. Area was then packed with iodoform gauze. Treated with weeklong course of doxycycline.  Again followed up on 10/4 with Dr. Cyndia Skeeters for wound check, everything looked good then. Wound closing at that time, but still open with mild drainage. Packing had already fallen out.  Today wound appears completely healed. Area is still indurated but no fluctuance appreciable at all. No erythema, warmth, or tenderness. No drainage. Suspect this was a sebaceous cyst that became infected, and as such it is at risk for reoccurence with infection. Advised if it becomes bigger, more tender, or red for patient to return, otherwise she may follow up as needed.  Chrisandra Netters, MD [place]

## 2016-01-10 NOTE — Progress Notes (Signed)
   Patient in nurse clinic for wound to center of back. Patient recently had a cyst that was lanced and drained.  Area is healed well, no drainage, redness, or swelling.  The area has knot, but slightly tender to touch.  Patient stated that the wound is very itchy.  Patient denies fever.  Patient is still taking the antibiotics, however she reported that this past weekend was a holiday for her, so she did not take the medication.  Advised pt to complete the full of the antibiotics.  Precept with Dr. Ardelia Mems, patient does have a knot in place; patient to return if area increase in redness, swelling or fever. Patient verbalized understanding.  Derl Barrow, RN

## 2016-01-15 ENCOUNTER — Ambulatory Visit (INDEPENDENT_AMBULATORY_CARE_PROVIDER_SITE_OTHER): Payer: Commercial Managed Care - HMO | Admitting: Orthopaedic Surgery

## 2016-01-15 DIAGNOSIS — M545 Low back pain: Secondary | ICD-10-CM | POA: Diagnosis not present

## 2016-01-16 ENCOUNTER — Ambulatory Visit (INDEPENDENT_AMBULATORY_CARE_PROVIDER_SITE_OTHER): Payer: Self-pay | Admitting: Orthopaedic Surgery

## 2016-01-22 ENCOUNTER — Ambulatory Visit: Payer: Commercial Managed Care - HMO | Attending: Orthopaedic Surgery

## 2016-01-22 DIAGNOSIS — R293 Abnormal posture: Secondary | ICD-10-CM | POA: Diagnosis not present

## 2016-01-22 DIAGNOSIS — R262 Difficulty in walking, not elsewhere classified: Secondary | ICD-10-CM | POA: Diagnosis not present

## 2016-01-22 DIAGNOSIS — M4317 Spondylolisthesis, lumbosacral region: Secondary | ICD-10-CM | POA: Insufficient documentation

## 2016-01-22 DIAGNOSIS — M62830 Muscle spasm of back: Secondary | ICD-10-CM | POA: Diagnosis not present

## 2016-01-22 DIAGNOSIS — M6281 Muscle weakness (generalized): Secondary | ICD-10-CM | POA: Diagnosis not present

## 2016-01-22 DIAGNOSIS — M2560 Stiffness of unspecified joint, not elsewhere classified: Secondary | ICD-10-CM | POA: Diagnosis not present

## 2016-01-22 NOTE — Patient Instructions (Signed)
Issued HEP from cabinet hamstring and knee to chest stretch 2x/day 2-3 reps 30-60 sec Rt and LT   And glute squeezed for post pelvic tilt and deep breathing 5-10 reps x 2x/day

## 2016-01-22 NOTE — Therapy (Signed)
[place], [place], [address] Phone: [phone]   Fax:  [phone]  Physical Therapy Evaluation  Patient Details  Name: Carrie [name] MRN: [id number] Date of Birth: 1950/05/01 Referring Provider: Rodell Perna, MD  Encounter Date: 01/22/2016      PT End of Session - 01/22/16 1030    Visit Number 1   Number of Visits 12   Date for PT Re-Evaluation 03/04/16   Authorization Type [employer] MCR   PT Start Time 0850   PT Stop Time 0930   PT Time Calculation (min) 40 min   Activity Tolerance Patient tolerated treatment well   Behavior During Therapy [facility] for tasks assessed/performed      Past Medical History:  Diagnosis Date  . Arthritis    "back, right hand" (07/11/2014)  . Breast cancer, right breast ([place]) 05/2009  . Chronic lower back pain   . Hepatic steatosis 07/17/2011  . Hypertension   . Hypokalemia 07/17/2011  . Lower GI bleeding 07/11/2014 hospitalized    Past Surgical History:  Procedure Laterality Date  . BREAST BIOPSY Right 2011  . BREAST LUMPECTOMY Right 2011  . [place] OF UTERUS  07/2009  . ESOPHAGOGASTRODUODENOSCOPY N/A 07/12/2014   Procedure: ESOPHAGOGASTRODUODENOSCOPY (EGD);  Surgeon: Clarene Essex, MD;  Location: [facility] ENDOSCOPY;  Service: Endoscopy;  Laterality: N/A;  . MASTECTOMY COMPLETE / SIMPLE W/ SENTINEL NODE BIOPSY  05/2009   Archie Endo 05/23/2009    There were no vitals filed for this visit.       Subjective Assessment - 01/22/16 0856    Subjective She reports her back pain started with raising a window. She has been taking meds for her pain.   She reports worsening in past year.   MD said OA and shifted discs x 2.    Limitations Walking;Standing;House hold activities  stairs,    How long can you sit comfortably? As needed   How long can you stand comfortably? 5 min   How long can you walk comfortably? 200 feet   Diagnostic tests Xray:   Patient Stated Goals She wants  her back to be better.       Currently in Pain? Yes   Pain Score 5    Pain Location Back   Pain Orientation Posterior;Left;Right  Lt more than RT   Pain Descriptors / Indicators --  About to break.    Pain Type Chronic pain   Pain Radiating Towards LT thigh aching pain.    Pain Onset More than a month ago   Pain Frequency Intermittent   Aggravating Factors  See abovve   Pain Relieving Factors sitting ,    Multiple Pain Sites --  Knee pain also.             [facility] PT Assessment - 01/22/16 0854      Assessment   Medical Diagnosis chronic LBP ,spondylolisthesis L5S1   Referring Provider Rodell Perna, MD   Onset Date/Surgical Date --  10 years   Next MD Visit 02/16/16   Prior Therapy No     Precautions   Precautions None     Restrictions   Weight Bearing Restrictions No     Balance Screen   Has the patient fallen in the past 6 months No   Has the patient had a decrease in activity level because of a fear of falling?  No   Is the patient reluctant to leave their home because of a fear of falling?  No     Prior Function   Level of Independence Independent  with pain   Vocation On disability     Cognition   Overall Cognitive Status Within Functional Limits for tasks assessed     ROM / Strength   AROM / PROM / Strength AROM;Strength     AROM   AROM Assessment Site Lumbar   Lumbar Flexion 90   Lumbar Extension 12   Lumbar - Right Side Bend 18   Lumbar - Left Side Bend 15     Strength   Overall Strength Comments LE WNL     Flexibility   Soft Tissue Assessment /Muscle Length yes     Ambulation/Gait   Gait Comments Decreased stride, flexed trunk slight                           PT Education - 01/22/16 0926    Education provided Yes   Education Details POc HEP   Person(s) Educated Patient   Methods Explanation;Demonstration;Tactile cues;Verbal cues   Comprehension Returned demonstration;Verbalized understanding          PT Short  Term Goals - 01/22/16 1141      PT SHORT TERM GOAL #1   Title Independent with inital HEP   Time 3   Period Weeks   Status New     PT SHORT TERM GOAL #2   Title She will report pain eased 25% in lower back  with normal home tasks   Time 3   Period Weeks   Status New           PT Long Term Goals - 01/22/16 1141      PT LONG TERM GOAL #1   Title She will be independent with all HEP issued as of last visit   Time 6   Period Weeks   Status New     PT LONG TERM GOAL #2   Title She will report 40-50% decreased lower back  pain.    Time 6   Period Weeks   Status New     PT LONG TERM GOAL #3   Title She will be avle to walk 500 feet or more before pain increases   Time 6   Period Weeks   Status New     PT LONG TERM GOAL #4   Title she will be able to stand for > 10 min before pain incr   Time 6   Period Weeks   Status New               Plan - 01/22/16 1103    Clinical Impression Statement Ms . Palazzi presents for  moderate complexity eval. Her symptoms into her leg are more recent. She has stiffness of spine and poor core strength, abnormal posture. She may improve with PT but with the chronic nature of problem this is unclear as to how much.   Rehab Potential Good   PT Frequency 2x / week   PT Duration 6 weeks   PT Treatment/Interventions Cryotherapy;Electrical Stimulation;Ultrasound;Patient/family education;Passive range of motion;Manual techniques;Taping;Therapeutic exercise;Dry needling   PT Next Visit Plan Streching and core stability program flexion based, modalities as needed   PT Home Exercise Plan glute squeeze with post pelvic tilt and stretches Knee to chest and hamstrings    Consulted and Agree with Plan of Care Patient      Patient will benefit from skilled therapeutic intervention in order to improve the following  deficits and impairments:  Decreased range of motion, Difficulty walking, Pain, Postural dysfunction, Increased muscle spasms,  Decreased strength  Visit Diagnosis: Spondylolisthesis at L5-S1 level  Abnormal posture  Joint stiffness of spine  Muscle spasm of back  Difficulty in walking, not elsewhere classified  Muscle weakness (generalized)      G-Codes - 17-Feb-2016 1145    Functional Assessment Tool Used clinical judgement   Functional Limitation Mobility: Walking and moving around   Mobility: Walking and Moving Around Current Status [phone]) At least 60 percent but less than 80 percent impaired, limited or restricted   Mobility: Walking and Moving Around Goal Status [phone]) At least 40 percent but less than 60 percent impaired, limited or restricted       Problem List Patient Active Problem List   Diagnosis Date Noted  . Boil, back 12/25/2015  . Eustachian tube dysfunction 09/04/2015  . Pain in toe of right foot 05/08/2015  . Back pain of thoracolumbar region 05/08/2015  . Left sided abdominal pain 01/10/2015  . Epidermoid cyst of skin 01/10/2015  . Muscle cramping 12/05/2014  . Low back pain 06/14/2014  . Preventative health care 03/15/2014  . Diabetes mellitus screening 02/22/2014  . Bilateral knee pain 02/08/2014  . Right knee pain 08/04/2013  . Left knee pain 08/04/2013  . Transaminitis 07/23/2013  . Essential hypertension, benign 07/23/2013  . GERD (gastroesophageal reflux disease) 07/23/2013  . Vitamin D deficiency 07/22/2013  . Breast cancer, left breast ([place]) 07/17/2011  . FIBROIDS, UTERUS 01/30/2007  . Alcohol abuse 01/30/2007    Darrel Hoover PT 02-17-2016, 11:45 AM  [facility] [address] [place], [place], [address] Phone: [phone]   Fax:  [phone]  Name: TASHICA EYERLY MRN: [id number] Date of Birth: 28-Jul-1950

## 2016-01-25 ENCOUNTER — Ambulatory Visit (INDEPENDENT_AMBULATORY_CARE_PROVIDER_SITE_OTHER): Payer: Commercial Managed Care - HMO | Admitting: Family Medicine

## 2016-01-25 ENCOUNTER — Encounter: Payer: Self-pay | Admitting: Family Medicine

## 2016-01-25 VITALS — BP 132/67 | HR 87 | Temp 97.5°F | Ht 61.0 in | Wt 193.6 lb

## 2016-01-25 DIAGNOSIS — M25562 Pain in left knee: Secondary | ICD-10-CM

## 2016-01-25 DIAGNOSIS — M25561 Pain in right knee: Secondary | ICD-10-CM | POA: Diagnosis not present

## 2016-01-25 DIAGNOSIS — G8929 Other chronic pain: Secondary | ICD-10-CM

## 2016-01-25 MED ORDER — METHYLPREDNISOLONE ACETATE 40 MG/ML IJ SUSP
40.0000 mg | Freq: Once | INTRAMUSCULAR | Status: AC
  Administered 2016-01-25: 40 mg via INTRA_ARTICULAR

## 2016-01-25 NOTE — Patient Instructions (Signed)
Knee Injection, Care After Refer to this sheet in the next few weeks. These instructions provide you with information about caring for yourself after your procedure. Your health care provider may also give you more specific instructions. Your treatment has been planned according to current medical practices, but problems sometimes occur. Call your health care provider if you have any problems or questions after your procedure. WHAT TO EXPECT AFTER THE PROCEDURE After your procedure, it is common to have:  Soreness.  Warmth.  Swelling. You may have more pain, swelling, and warmth than you did before the injection. This reaction may last for about one day.  HOME CARE INSTRUCTIONS Bathing  If you were given a bandage (dressing), keep it dry until your health care provider says it can be removed. Ask your health care provider when you can start showering or taking a bath. Managing Pain, Stiffness, and Swelling  If directed, apply ice to the injection area:  Put ice in a plastic bag.  Place a towel between your skin and the bag.  Leave the ice on for 20 minutes, 2-3 times per day.  Do not apply heat to your knee.  Raise the injection area above the level of your heart while you are sitting or lying down. Activity  Avoid strenuous activities for as long as directed by your health care provider. Ask your health care provider when you can return to your normal activities. General Instructions  Take medicines only as directed by your health care provider.  Do not take aspirin or other over-the-counter medicines unless your health care provider says you can.  Check your injection site every day for signs of infection. Watch for:  Redness, swelling, or pain.  Fluid, blood, or pus.  Follow your health care provider's instructions about dressing changes and removal. SEEK MEDICAL CARE IF:  You have symptoms at your injection site that last longer than two days after your  procedure.  You have redness, swelling, or pain in your injection area.  You have fluid, blood, or pus coming from your injection site.  You have warmth in your injection area.  You have a fever.  Your pain is not controlled with medicine. SEEK IMMEDIATE MEDICAL CARE IF:  Your knee turns very red.  Your knee becomes very swollen.  Your knee pain is severe.   This information is not intended to replace advice given to you by your health care provider. Make sure you discuss any questions you have with your health care provider.   Document Released: 04/08/2014 Document Reviewed: 04/08/2014 Elsevier Interactive Patient Education 2016 [employer] Inc.  

## 2016-01-25 NOTE — Assessment & Plan Note (Addendum)
Last corticosteroid injection 07/2015.  Bilateral corticosteroid injection performed today.  Patient tolerated procedure well.  Home care instructions provided.  Discussed that this is not an ideal treatment long term.  Would recommend imaging and referral to orthopedics for further evaluation and options.  Referral placed.  May continue NSAIDs prn.  Could consider topical agents as well.  Patient was not interested in Voltaren gel at this time.

## 2016-01-25 NOTE — Progress Notes (Signed)
    Subjective: CC: knee pain HPI: Carrie [name] is a 65 y.o. female presenting to clinic today for office visit. Concerns today include:  1. Knee pain Patient notes that she has had a long standing history of bilateral knee pain.  R>L.  She reports that she has occ swelling in knees.  She has used OTC Motrin 600-800mg  daily with little relief.  She reports that she has had some relief from a corticosteroid injection about 4 months ago and would like to repeat this.  She reports that she has had 2 total lifetime corticosteroid injections.  She is wondering if she needs to start getting visco-supplementation, though she is reluctant to see an orthopedist about this as she has financial constraints.  No fevers, chills, heat, erythema, weakness, numbness or tingling in knees.  Social History Reviewed: non smoker. FamHx and MedHx reviewed.  Please see EMR. Health Maintenance: declines flu  ROS: Per HPI  Objective: Office vital signs reviewed. BP 132/67   Pulse 87   Temp 97.5 F (36.4 C) (Oral)   Ht 5\' 1"  (1.549 m)   Wt 193 lb 9.6 oz (87.8 kg)   SpO2 100%   BMI 36.58 kg/m   Physical Examination:  General: Awake, alert, well nourished, No acute distress Extremities: warm, well perfused, No edema, cyanosis or clubbing; +2 pulses bilaterally MSK: Normal gait and station, no effusions, erythema, swelling, ecchymosis appreciated on either knee.  Negative appley grind, negative joint laxity, no joint line TTP, +crepitus, normal AROM. Skin: dry, intact, no rashes or lesions Neuro: Strength and sensation grossly intact  INJECTION: LEFT knee Patient was given informed consent, signed copy in the chart. Appropriate time out was taken. Area prepped and draped in usual sterile fashion. 1 cc of methylprednisolone 40 mg/ml plus  2 cc of 1% lidocaine without epinephrine was injected into the left knee using a(n) anterio-lateral approach. The patient tolerated the procedure well. There were no  complications. Post procedure instructions were given.  INJECTION: RIGHT knee Patient was given informed consent, signed copy in the chart. Appropriate time out was taken. Area prepped and draped in usual sterile fashion. 1 cc of methylprednisolone 40 mg/ml plus  2 cc of 1% lidocaine without epinephrine was injected into the right knee using a(n) anterio-medial approach. The patient tolerated the procedure well. There were no complications. Post procedure instructions were given.  Assessment/ Plan: 65 y.o. female   Bilateral knee pain Last corticosteroid injection 07/2015.  Bilateral corticosteroid injection performed today.  Patient tolerated procedure well.  Home care instructions provided.  Discussed that this is not an ideal treatment long term.  Would recommend imaging and referral to orthopedics for further evaluation and options.  Referral placed.  May continue NSAIDs prn.  Could consider topical agents as well.  Patient was not interested in Voltaren gel at this time.  Janora Norlander, DO PGY-3, [facility]

## 2016-01-26 ENCOUNTER — Ambulatory Visit: Payer: Commercial Managed Care - HMO

## 2016-01-29 ENCOUNTER — Ambulatory Visit: Payer: Commercial Managed Care - HMO

## 2016-01-31 ENCOUNTER — Ambulatory Visit: Payer: Commercial Managed Care - HMO | Attending: Orthopaedic Surgery | Admitting: Physical Therapy

## 2016-01-31 DIAGNOSIS — M6281 Muscle weakness (generalized): Secondary | ICD-10-CM | POA: Insufficient documentation

## 2016-01-31 DIAGNOSIS — M4317 Spondylolisthesis, lumbosacral region: Secondary | ICD-10-CM | POA: Diagnosis not present

## 2016-01-31 DIAGNOSIS — M62830 Muscle spasm of back: Secondary | ICD-10-CM | POA: Diagnosis not present

## 2016-01-31 DIAGNOSIS — R262 Difficulty in walking, not elsewhere classified: Secondary | ICD-10-CM | POA: Insufficient documentation

## 2016-01-31 DIAGNOSIS — M545 Low back pain: Secondary | ICD-10-CM | POA: Insufficient documentation

## 2016-01-31 DIAGNOSIS — M2560 Stiffness of unspecified joint, not elsewhere classified: Secondary | ICD-10-CM | POA: Insufficient documentation

## 2016-01-31 DIAGNOSIS — R6889 Other general symptoms and signs: Secondary | ICD-10-CM | POA: Diagnosis not present

## 2016-01-31 DIAGNOSIS — R293 Abnormal posture: Secondary | ICD-10-CM

## 2016-01-31 DIAGNOSIS — M25561 Pain in right knee: Secondary | ICD-10-CM | POA: Diagnosis not present

## 2016-01-31 NOTE — Therapy (Signed)
[place], [place], [address] Phone: [phone]   Fax:  [phone]  Physical Therapy Treatment  Patient Details  Name: Carrie [name] MRN: [id number] Date of Birth: [birth year] Referring Provider: Rodell Perna, MD  Encounter Date: 01/31/2016      PT End of Session - 01/31/16 0850    Visit Number 2   Number of Visits 12   Date for PT Re-Evaluation 03/04/16   Authorization Type [employer] MCR   PT Start Time 0845   PT Stop Time 0933   PT Time Calculation (min) 48 min      Past Medical History:  Diagnosis Date  . Arthritis    "back, right hand" (07/11/2014)  . Breast cancer, right breast ([place]) 05/2009  . Chronic lower back pain   . Hepatic steatosis 07/17/2011  . Hypertension   . Hypokalemia 07/17/2011  . Lower GI bleeding 07/11/2014 hospitalized    Past Surgical History:  Procedure Laterality Date  . BREAST BIOPSY Right 2011  . BREAST LUMPECTOMY Right 2011  . [place] OF UTERUS  07/2009  . ESOPHAGOGASTRODUODENOSCOPY N/A 07/12/2014   Procedure: ESOPHAGOGASTRODUODENOSCOPY (EGD);  Surgeon: Clarene Essex, MD;  Location: [facility] ENDOSCOPY;  Service: Endoscopy;  Laterality: N/A;  . MASTECTOMY COMPLETE / SIMPLE W/ SENTINEL NODE BIOPSY  05/2009   Archie Endo 05/23/2009    There were no vitals filed for this visit.      Subjective Assessment - 01/31/16 0849    Subjective I tried the stretch but my sheet is too big to use.    Currently in Pain? Yes   Pain Score 7    Pain Location Back   Aggravating Factors  walking standing, household chores   Pain Relieving Factors sitting   Multiple Pain Sites Yes   Pain Score 7   Pain Location Knee   Pain Orientation Left   Pain Descriptors / Indicators Aching                         [facility] Adult PT Treatment/Exercise - 01/31/16 0001      Lumbar Exercises: Stretches   Active Hamstring Stretch 3 reps;30 seconds   Active Hamstring Stretch Limitations  shown long sitting as another option   Single Knee to Chest Stretch 30 seconds;3 reps   Lower Trunk Rotation 10 seconds     Lumbar Exercises: Aerobic   Stationary Bike Nustep L3 UE/LE x 5 minutes     Lumbar Exercises: Supine   Glut Set 10 reps;5 seconds   Glut Set Limitations with posterior pelvic tilt., verbal cues and demonstration required   Clam 10 reps   Clam Limitations with glute squueze   Other Supine Lumbar Exercises Posterior Pelvic Tilt with ball squeeze 5 sec x 10 .      Modalities   Modalities Electrical Stimulation;Moist Heat     Moist Heat Therapy   Number Minutes Moist Heat 15 Minutes   Moist Heat Location Lumbar Spine     Electrical Stimulation   Electrical Stimulation Location Lumbar    Electrical Stimulation Action IFC x 15 munutes   Electrical Stimulation Parameters 20 ma   Electrical Stimulation Goals Pain                PT Education - 01/31/16 0913    Education provided Yes   Education Details HEP    Person(s) Educated Patient   Methods Explanation;Handout   Comprehension Verbalized understanding  PT Short Term Goals - 01/22/16 1141      PT SHORT TERM GOAL #1   Title Independent with inital HEP   Time 3   Period Weeks   Status New     PT SHORT TERM GOAL #2   Title She will report pain eased 25% in lower back  with normal home tasks   Time 3   Period Weeks   Status New           PT Long Term Goals - 01/22/16 1141      PT LONG TERM GOAL #1   Title She will be independent with all HEP issued as of last visit   Time 6   Period Weeks   Status New     PT LONG TERM GOAL #2   Title She will report 40-50% decreased lower back  pain.    Time 6   Period Weeks   Status New     PT LONG TERM GOAL #3   Title She will be avle to walk 500 feet or more before pain increases   Time 6   Period Weeks   Status New     PT LONG TERM GOAL #4   Title she will be able to stand for > 10 min before pain incr   Time 6   Period  Weeks   Status New               Plan - 01/31/16 [id number]    Clinical Impression Statement Pt with questions about hamstring stretch. She does not have small enough sheets to use as a strap. Instructed pt in other ways to stretch hamstrings or she can use a dog leash or a belt. Pt perfromed HEP with min cues. Added lower trunk rotation to HEP. Trial of IFC/HMP for pain.    PT Next Visit Plan Streching and core stability program flexion based, modalities as needed   PT Home Exercise Plan glute squeeze with post pelvic tilt and stretches Knee to chest and hamstrings, lower trunk rotation   Consulted and Agree with Plan of Care Patient      Patient will benefit from skilled therapeutic intervention in order to improve the following deficits and impairments:  Decreased range of motion, Difficulty walking, Pain, Postural dysfunction, Increased muscle spasms, Decreased strength  Visit Diagnosis: Spondylolisthesis at L5-S1 level  Abnormal posture  Joint stiffness of spine  Muscle spasm of back  Difficulty in walking, not elsewhere classified  Muscle weakness (generalized)     Problem List Patient Active Problem List   Diagnosis Date Noted  . Boil, back 12/25/2015  . Eustachian tube dysfunction 09/04/2015  . Pain in toe of right foot 05/08/2015  . Back pain of thoracolumbar region 05/08/2015  . Left sided abdominal pain 01/10/2015  . Epidermoid cyst of skin 01/10/2015  . Muscle cramping 12/05/2014  . Low back pain 06/14/2014  . Preventative health care 03/15/2014  . Diabetes mellitus screening 02/22/2014  . Bilateral knee pain 02/08/2014  . Right knee pain 08/04/2013  . Left knee pain 08/04/2013  . Transaminitis 07/23/2013  . Essential hypertension, benign 07/23/2013  . GERD (gastroesophageal reflux disease) 07/23/2013  . Vitamin D deficiency 07/22/2013  . Breast cancer, left breast ([place]) 07/17/2011  . FIBROIDS, UTERUS 01/30/2007  . Alcohol abuse 01/30/2007     Dorene Ar, PTA 01/31/2016, 9:35 AM  [place], [place], [address] Phone: [phone]   Fax:  [phone]  Name: Carrie [name] MRN: [id number] Date of Birth: June 16, 1950

## 2016-01-31 NOTE — Patient Instructions (Signed)
  Lower Trunk Rotation Stretch   Keeping back flat and feet together, rotate knees to left side. Hold ___10_ seconds. Repeat __3-5_ times per set. . Do __2__ sessions per day.

## 2016-02-01 ENCOUNTER — Ambulatory Visit: Payer: Commercial Managed Care - HMO

## 2016-02-05 ENCOUNTER — Ambulatory Visit: Payer: Commercial Managed Care - HMO | Admitting: Physical Therapy

## 2016-02-05 DIAGNOSIS — M6281 Muscle weakness (generalized): Secondary | ICD-10-CM

## 2016-02-05 DIAGNOSIS — M62830 Muscle spasm of back: Secondary | ICD-10-CM | POA: Diagnosis not present

## 2016-02-05 DIAGNOSIS — R262 Difficulty in walking, not elsewhere classified: Secondary | ICD-10-CM | POA: Diagnosis not present

## 2016-02-05 DIAGNOSIS — R6889 Other general symptoms and signs: Secondary | ICD-10-CM

## 2016-02-05 DIAGNOSIS — M25561 Pain in right knee: Secondary | ICD-10-CM

## 2016-02-05 DIAGNOSIS — M4317 Spondylolisthesis, lumbosacral region: Secondary | ICD-10-CM

## 2016-02-05 DIAGNOSIS — M545 Low back pain, unspecified: Secondary | ICD-10-CM

## 2016-02-05 DIAGNOSIS — R293 Abnormal posture: Secondary | ICD-10-CM

## 2016-02-05 DIAGNOSIS — M2560 Stiffness of unspecified joint, not elsewhere classified: Secondary | ICD-10-CM

## 2016-02-05 NOTE — Therapy (Signed)
[place], [place], [address] Phone: [phone]   Fax:  [phone]  Physical Therapy Treatment  Patient Details  Name: Carrie [name] MRN: [id number] Date of Birth: January 26, 1951 Referring Provider: Rodell Perna, MD  Encounter Date: 02/05/2016      PT End of Session - 02/05/16 0944    Visit Number 3   Number of Visits 12   Date for PT Re-Evaluation 03/04/16   Authorization Type [employer] MCR   PT Start Time 0940   PT Stop Time 1025   PT Time Calculation (min) 45 min      Past Medical History:  Diagnosis Date  . Arthritis    "back, right hand" (07/11/2014)  . Breast cancer, right breast ([place]) 05/2009  . Chronic lower back pain   . Hepatic steatosis 07/17/2011  . Hypertension   . Hypokalemia 07/17/2011  . Lower GI bleeding 07/11/2014 hospitalized    Past Surgical History:  Procedure Laterality Date  . BREAST BIOPSY Right 2011  . BREAST LUMPECTOMY Right 2011  . [place] OF UTERUS  07/2009  . ESOPHAGOGASTRODUODENOSCOPY N/A 07/12/2014   Procedure: ESOPHAGOGASTRODUODENOSCOPY (EGD);  Surgeon: Clarene Essex, MD;  Location[facility] ENDOSCOPY;  Service: Endoscopy;  Laterality: N/A;  . MASTECTOMY COMPLETE / SIMPLE W/ SENTINEL NODE BIOPSY  05/2009   Archie Endo 05/23/2009    There were no vitals filed for this visit.      Subjective Assessment - 02/05/16 0942    Subjective I hurt last night because I made a wrong turn in bed.    Currently in Pain? Yes   Pain Score 5    Pain Location Knee   Pain Orientation Lower;Left   Pain Descriptors / Indicators Dull   Aggravating Factors  turning wrong in bed   Pain Relieving Factors sitting   Pain Score 7   Pain Location Knee   Pain Orientation Right   Pain Descriptors / Indicators Aching   Pain Type Chronic pain   Aggravating Factors  walking   Pain Relieving Factors laying down                         [facility] Adult PT Treatment/Exercise - 02/05/16  0001      Lumbar Exercises: Stretches   Active Hamstring Stretch 3 reps;30 seconds   Active Hamstring Stretch Limitations shown long sitting as another option   Single Knee to Chest Stretch 30 seconds;3 reps   Lower Trunk Rotation 10 seconds     Lumbar Exercises: Aerobic   Stationary Bike Nustep L5 UE/LE x 5 minutes     Lumbar Exercises: Supine   Glut Set 10 reps;5 seconds   Glut Set Limitations with posterior pelvic tilt., verbal cues and demonstration required   Clam Limitations attempted green band with posterior pelvic tilt, pt c/o right anterior hip cramp  so disc.    Bent Knee Raise 10 reps   Bent Knee Raise Limitations with posterior pelvic tilt, pt began to c/o tight anterior hip cramp   Bridge Limitations with posterior pelvic tilt x 3 c/o hamstring cramp left and right side pain so disc.      Knee/Hip Exercises: Stretches   Hip Flexor Stretch Right;1 rep;60 seconds   Hip Flexor Stretch Limitations due to right anterior hip cramp     Moist Heat Therapy   Number Minutes Moist Heat 15 Minutes   Moist Heat Location Lumbar Spine     Electrical Stimulation  [place] Action IFCx 15 min   Electrical Stimulation Parameters 20 ma   Electrical Stimulation Goals Pain                  PT Short Term Goals - 01/22/16 1141      PT SHORT TERM GOAL #1   Title Independent with inital HEP   Time 3   Period Weeks   Status New     PT SHORT TERM GOAL #2   Title She will report pain eased 25% in lower back  with normal home tasks   Time 3   Period Weeks   Status New           PT Long Term Goals - 01/22/16 1141      PT LONG TERM GOAL #1   Title She will be independent with all HEP issued as of last visit   Time 6   Period Weeks   Status New     PT LONG TERM GOAL #2   Title She will report 40-50% decreased lower back  pain.    Time 6   Period Weeks   Status New     PT LONG TERM GOAL #3   Title She  will be avle to walk 500 feet or more before pain increases   Time 6   Period Weeks   Status New     PT LONG TERM GOAL #4   Title she will be able to stand for > 10 min before pain incr   Time 6   Period Weeks   Status New               Plan - 02/05/16 0946    Clinical Impression Statement Pt reports IFC helpful for several days after last treatment. She reports increased pain continues with standing tasks like washing dishes and cooking which requires frequent rest breaks. Today with therex she c/o anterior hip cramps and hamstring cramps requring frequent rest breaks.    PT Next Visit Plan Streching and core stability program flexion based, modalities as needed; assess pelvis and hips, manual?    PT Home Exercise Plan glute squeeze with post pelvic tilt and stretches Knee to chest and hamstrings, lower trunk rotation      Patient will benefit from skilled therapeutic intervention in order to improve the following deficits and impairments:  Decreased range of motion, Difficulty walking, Pain, Postural dysfunction, Increased muscle spasms, Decreased strength  Visit Diagnosis: Spondylolisthesis at L5-S1 level  Abnormal posture  Joint stiffness of spine  Muscle spasm of back  Difficulty in walking, not elsewhere classified  Muscle weakness (generalized)  Right knee pain, unspecified chronicity  Activity intolerance  Midline low back pain without sciatica, unspecified chronicity     Problem List Patient Active Problem List   Diagnosis Date Noted  . Boil, back 12/25/2015  . Eustachian tube dysfunction 09/04/2015  . Pain in toe of right foot 05/08/2015  . Back pain of thoracolumbar region 05/08/2015  . Left sided abdominal pain 01/10/2015  . Epidermoid cyst of skin 01/10/2015  . Muscle cramping 12/05/2014  . Low back pain 06/14/2014  . Preventative health care 03/15/2014  . Diabetes mellitus screening 02/22/2014  . Bilateral knee pain 02/08/2014  . Right  knee pain 08/04/2013  . Left knee pain 08/04/2013  . Transaminitis 07/23/2013  . Essential hypertension, benign 07/23/2013  . GERD (gastroesophageal reflux disease) 07/23/2013  . Vitamin D deficiency 07/22/2013  .  Breast cancer, left breast ([place]) 07/17/2011  . FIBROIDS, UTERUS 01/30/2007  . Alcohol abuse 01/30/2007    Dorene Ar, PTA 02/05/2016, 10:19 AM  [facility] [address] [place], [place], [address] Phone: [phone]   Fax:  [phone]  Name: Carrie [name] MRN: [id number] Date of Birth: September 03, 1950

## 2016-02-07 ENCOUNTER — Ambulatory Visit: Payer: Commercial Managed Care - HMO | Admitting: Physical Therapy

## 2016-02-07 ENCOUNTER — Ambulatory Visit: Payer: Commercial Managed Care - HMO

## 2016-02-07 DIAGNOSIS — R262 Difficulty in walking, not elsewhere classified: Secondary | ICD-10-CM

## 2016-02-07 DIAGNOSIS — M6281 Muscle weakness (generalized): Secondary | ICD-10-CM

## 2016-02-07 DIAGNOSIS — M25561 Pain in right knee: Secondary | ICD-10-CM | POA: Diagnosis not present

## 2016-02-07 DIAGNOSIS — M2560 Stiffness of unspecified joint, not elsewhere classified: Secondary | ICD-10-CM

## 2016-02-07 DIAGNOSIS — M4317 Spondylolisthesis, lumbosacral region: Secondary | ICD-10-CM

## 2016-02-07 DIAGNOSIS — M62830 Muscle spasm of back: Secondary | ICD-10-CM

## 2016-02-07 DIAGNOSIS — M545 Low back pain: Secondary | ICD-10-CM | POA: Diagnosis not present

## 2016-02-07 DIAGNOSIS — R6889 Other general symptoms and signs: Secondary | ICD-10-CM | POA: Diagnosis not present

## 2016-02-07 DIAGNOSIS — R293 Abnormal posture: Secondary | ICD-10-CM

## 2016-02-07 NOTE — Therapy (Signed)
[place], [place], [address] Phone: [phone]   Fax:  [phone]  Physical Therapy Treatment  Patient Details  Name: Carrie [name] MRN: [id number] Date of Birth: May 30, 1950 Referring Provider: Rodell Perna, MD  Encounter Date: 02/07/2016      PT End of Session - 02/07/16 0935    Visit Number 4   Number of Visits 12   Date for PT Re-Evaluation 03/04/16   Authorization Type [employer] MCR   PT Start Time 0933   PT Stop Time 1030   PT Time Calculation (min) 57 min      Past Medical History:  Diagnosis Date  . Arthritis    "back, right hand" (07/11/2014)  . Breast cancer, right breast ([place]) 05/2009  . Chronic lower back pain   . Hepatic steatosis 07/17/2011  . Hypertension   . Hypokalemia 07/17/2011  . Lower GI bleeding 07/11/2014 hospitalized    Past Surgical History:  Procedure Laterality Date  . BREAST BIOPSY Right 2011  . BREAST LUMPECTOMY Right 2011  . [place] OF UTERUS  07/2009  . ESOPHAGOGASTRODUODENOSCOPY N/A 07/12/2014   Procedure: ESOPHAGOGASTRODUODENOSCOPY (EGD);  Surgeon: Clarene Essex, MD;  Location: [facility] ENDOSCOPY;  Service: Endoscopy;  Laterality: N/A;  . MASTECTOMY COMPLETE / SIMPLE W/ SENTINEL NODE BIOPSY  05/2009   Archie Endo 05/23/2009    There were no vitals filed for this visit.      Subjective Assessment - 02/07/16 0935    Currently in Pain? Yes   Pain Score 5    Pain Location Back   Pain Orientation Right;Lower   Pain Score 7   Pain Location Knee   Pain Orientation Right                         [facility] Adult PT Treatment/Exercise - 02/07/16 0001      Lumbar Exercises: Stretches   Passive Hamstring Stretch 1 rep;60 seconds   Piriformis Stretch 2 reps;30 seconds   Piriformis Stretch Limitations and figure 4      Lumbar Exercises: Aerobic   Stationary Bike Nustep L2 UE/LE x 5 minutes  decreased knee pain after Nustep      Lumbar Exercises: Supine    Glut Set 10 reps;5 seconds   Glut Set Limitations with posterior pelvic tilt., verbal cues and demonstration required   Clam 10 reps   Clam Limitations with glute squeeze and posterior pelvic tilt    Bridge Limitations with posterior pelvic tilt x 10   Other Supine Lumbar Exercises Posterior Pelvic Tilt with ball squeeze 5 sec x 10 .      Moist Heat Therapy   Number Minutes Moist Heat 15 Minutes   Moist Heat Location Lumbar Spine     Electrical Stimulation   Electrical Stimulation Location lumbar   Electrical Stimulation Action IFC x 15 min   Electrical Stimulation Parameters 22   Electrical Stimulation Goals Pain     Manual Therapy   Manual Therapy Joint mobilization   Joint Mobilization long axis distraction and grade II, III A/P hip mobs bilateral followed by passive hip rom                 PT Education - 02/07/16 1016    Education provided Yes   Education Details HEP   Person(s) Educated Patient   Methods Explanation;Handout   Comprehension Verbalized understanding          PT Short Term Goals - 01/22/16  [place] #1   Title Independent with inital HEP   Time 3   Period Weeks   Status New     PT SHORT TERM GOAL #2   Title She will report pain eased 25% in lower back  with normal home tasks   Time 3   Period Weeks   Status New           PT Long Term Goals - 01/22/16 1141      PT LONG TERM GOAL #1   Title She will be independent with all HEP issued as of last visit   Time 6   Period Weeks   Status New     PT LONG TERM GOAL #2   Title She will report 40-50% decreased lower back  pain.    Time 6   Period Weeks   Status New     PT LONG TERM GOAL #3   Title She will be avle to walk 500 feet or more before pain increases   Time 6   Period Weeks   Status New     PT LONG TERM GOAL #4   Title she will be able to stand for > 10 min before pain incr   Time 6   Period Weeks   Status New               Plan -  02/07/16 1016    Clinical Impression Statement Decreased knee pain afteer Nustep. 5/10 LBP on left side today. Performed bilateral hip mobilizations and distraction with pt reports 0/10 pain upon staniding afterward. Decreased c/o spasms in hip and hamstrings with therex today. Pt demonstrates poor body mechanics and requires constant cues for correct body mechanoics with bed mobility stooping activities. After IFC and HMP pt repots no knee pain or hip pain.    Clinical Impairments Affecting Rehab Potential O   PT Next Visit Plan Streching and core stability program flexion based, modalities as needed; continue left hip mobs and stretching   PT Home Exercise Plan glute squeeze with post pelvic tilt and stretches Knee to chest and hamstrings, lower trunk rotation, piriformis and figure 4 stretch      Patient will benefit from skilled therapeutic intervention in order to improve the following deficits and impairments:  Decreased range of motion, Difficulty walking, Pain, Postural dysfunction, Increased muscle spasms, Decreased strength  Visit Diagnosis: Spondylolisthesis at L5-S1 level  Abnormal posture  Joint stiffness of spine  Muscle spasm of back  Difficulty in walking, not elsewhere classified  Muscle weakness (generalized)     Problem List Patient Active Problem List   Diagnosis Date Noted  . Boil, back 12/25/2015  . Eustachian tube dysfunction 09/04/2015  . Pain in toe of right foot 05/08/2015  . Back pain of thoracolumbar region 05/08/2015  . Left sided abdominal pain 01/10/2015  . Epidermoid cyst of skin 01/10/2015  . Muscle cramping 12/05/2014  . Low back pain 06/14/2014  . Preventative health care 03/15/2014  . Diabetes mellitus screening 02/22/2014  . Bilateral knee pain 02/08/2014  . Right knee pain 08/04/2013  . Left knee pain 08/04/2013  . Transaminitis 07/23/2013  . Essential hypertension, benign 07/23/2013  . GERD (gastroesophageal reflux disease) 07/23/2013   . Vitamin D deficiency 07/22/2013  . Breast cancer, left breast ([place]) 07/17/2011  . FIBROIDS, UTERUS 01/30/2007  . Alcohol abuse 01/30/2007    Dorene Ar, PTA 02/07/2016, 10:37 AM  [facility]  [place], [place], [address] Phone: [phone]   Fax:  [phone]  Name: ADALIAH MARANA MRN: [id number] Date of Birth: [birth year]

## 2016-02-12 ENCOUNTER — Ambulatory Visit: Payer: Commercial Managed Care - HMO | Admitting: Physical Therapy

## 2016-02-12 DIAGNOSIS — R262 Difficulty in walking, not elsewhere classified: Secondary | ICD-10-CM

## 2016-02-12 DIAGNOSIS — R293 Abnormal posture: Secondary | ICD-10-CM

## 2016-02-12 DIAGNOSIS — M4317 Spondylolisthesis, lumbosacral region: Secondary | ICD-10-CM

## 2016-02-12 DIAGNOSIS — M2560 Stiffness of unspecified joint, not elsewhere classified: Secondary | ICD-10-CM | POA: Diagnosis not present

## 2016-02-12 DIAGNOSIS — M6281 Muscle weakness (generalized): Secondary | ICD-10-CM | POA: Diagnosis not present

## 2016-02-12 DIAGNOSIS — M62830 Muscle spasm of back: Secondary | ICD-10-CM

## 2016-02-12 DIAGNOSIS — M545 Low back pain: Secondary | ICD-10-CM | POA: Diagnosis not present

## 2016-02-12 DIAGNOSIS — R6889 Other general symptoms and signs: Secondary | ICD-10-CM | POA: Diagnosis not present

## 2016-02-12 DIAGNOSIS — M25561 Pain in right knee: Secondary | ICD-10-CM | POA: Diagnosis not present

## 2016-02-12 NOTE — Therapy (Addendum)
[place], [place], [address] Phone: [phone]   Fax:  [phone]  Physical Therapy Treatment/ Discharge  Patient Details  Name: Carrie [name] MRN: [id number] Date of Birth: Jan 05, 1951 Referring Provider: Rodell Perna, MD  Encounter Date: 02/12/2016      PT End of Session - 02/12/16 0936    Visit Number 5   Number of Visits 12   Date for PT Re-Evaluation 03/04/16   Authorization Type [employer] MCR   PT Start Time 0935   PT Stop Time 1030   PT Time Calculation (min) 55 min      Past Medical History:  Diagnosis Date  . Arthritis    "back, right hand" (07/11/2014)  . Breast cancer, right breast ([place]) 05/2009  . Chronic lower back pain   . Hepatic steatosis 07/17/2011  . Hypertension   . Hypokalemia 07/17/2011  . Lower GI bleeding 07/11/2014 hospitalized    Past Surgical History:  Procedure Laterality Date  . BREAST BIOPSY Right 2011  . BREAST LUMPECTOMY Right 2011  . [place] OF UTERUS  07/2009  . ESOPHAGOGASTRODUODENOSCOPY N/A 07/12/2014   Procedure: ESOPHAGOGASTRODUODENOSCOPY (EGD);  Surgeon: Clarene Essex, MD;  Location: [facility] ENDOSCOPY;  Service: Endoscopy;  Laterality: N/A;  . MASTECTOMY COMPLETE / SIMPLE W/ SENTINEL NODE BIOPSY  05/2009   Archie Endo 05/23/2009    There were no vitals filed for this visit.      Subjective Assessment - 02/12/16 0951    Subjective It hurts me to come up here.    Currently in Pain? Yes   Pain Score 5    Pain Location Back   Pain Orientation Right;Left;Lower   Pain Descriptors / Indicators Aching   Aggravating Factors  standing, walking    Pain Relieving Factors sitting                         [facility] Adult PT Treatment/Exercise - 02/12/16 0001      Lumbar Exercises: Stretches   Active Hamstring Stretch 3 reps;30 seconds   Single Knee to Chest Stretch 30 seconds;3 reps   Lower Trunk Rotation 10 seconds   Piriformis Stretch 2 reps;30  seconds   Piriformis Stretch Limitations and figure 4      Lumbar Exercises: Aerobic   Stationary Bike Nustep L2 UE/LE x 7 minutes  decreased knee pain after Nustep      Lumbar Exercises: Seated   Long Arc Quad on [place] 10 reps   LAQ on [place] Limitations on dyna disc, with ab set   Other Seated Lumbar Exercises alternating heel raises while sitting on ball- cramp in right hip , disc.      Lumbar Exercises: Supine   Glut Set 10 reps;5 seconds   Glut Set Limitations with posterior pelvic tilt., verbal cues and demonstration required     Moist Heat Therapy   Number Minutes Moist Heat 15 Minutes   Moist Heat Location Lumbar Spine     Electrical Stimulation   Electrical Stimulation Location lumbar    Electrical Stimulation Action IFC x15 minutes   Electrical Stimulation Parameters 20   Electrical Stimulation Goals Pain                  PT Short Term Goals - 02/12/16 0937      PT SHORT TERM GOAL #1   Title Independent with inital HEP   Baseline does not perform consistently    Time 3  Period Weeks   Status On-going     PT SHORT TERM GOAL #2   Title She will report pain eased 25% in lower back  with normal home tasks   Baseline 0%    Time 3   Period Weeks   Status On-going           PT Long Term Goals - 02/12/16 0957      PT LONG TERM GOAL #1   Title She will be independent with all HEP issued as of last visit   Time 6   Period Weeks   Status On-going     PT LONG TERM GOAL #2   Title She will report 40-50% decreased lower back  pain.    Baseline 0% improvement    Time 6   Period Weeks   Status On-going     PT LONG TERM GOAL #3   Title She will be able to walk 500 feet or more before pain increases   Baseline 250 ft    Time 6   Period Weeks   Status On-going     PT LONG TERM GOAL #4   Title she will be able to stand for > 10 min before pain incr   Baseline 10 minutes max   Time 6   Period Weeks   Status Partially Met                Plan - 02/12/16 0946    Clinical Impression Statement Increased pain  from 5/10 to 6/10 after 250 ft gait  in clinic. Pt reports short term relief with IFC however 0% overall improvement. She can tolerate 10 minutes max in stanidng prior to needing to sit due to inreased pain. LTG#4 partially Met.  She is not consistent with HEP. She sees MD this week for follow up and may cancel her future PT appointments.    PT Next Visit Plan See what MD said; Stretching and core stability program flexion based, modalities as needed; continue left hip mobs if helpful and stretching   PT Home Exercise Plan glute squeeze with post pelvic tilt and stretches Knee to chest and hamstrings, lower trunk rotation, piriformis and figure 4 stretch   Consulted and Agree with Plan of Care Patient      Patient will benefit from skilled therapeutic intervention in order to improve the following deficits and impairments:  Decreased range of motion, Difficulty walking, Pain, Postural dysfunction, Increased muscle spasms, Decreased strength  Visit Diagnosis: Spondylolisthesis at L5-S1 level  Abnormal posture  Joint stiffness of spine  Muscle spasm of back  Difficulty in walking, not elsewhere classified  Muscle weakness (generalized)     Problem List Patient Active Problem List   Diagnosis Date Noted  . Boil, back 12/25/2015  . Eustachian tube dysfunction 09/04/2015  . Pain in toe of right foot 05/08/2015  . Back pain of thoracolumbar region 05/08/2015  . Left sided abdominal pain 01/10/2015  . Epidermoid cyst of skin 01/10/2015  . Muscle cramping 12/05/2014  . Low back pain 06/14/2014  . Preventative health care 03/15/2014  . Diabetes mellitus screening 02/22/2014  . Bilateral knee pain 02/08/2014  . Right knee pain 08/04/2013  . Left knee pain 08/04/2013  . Transaminitis 07/23/2013  . Essential hypertension, benign 07/23/2013  . GERD (gastroesophageal reflux disease) 07/23/2013  . Vitamin D  deficiency 07/22/2013  . Breast cancer, left breast ([place]) 07/17/2011  . FIBROIDS, UTERUS 01/30/2007  . Alcohol abuse 01/30/2007    Hessie Diener  Sallee Provencal, PTA 02/12/2016, 10:56 AM  [facility] [address] [place], [place], [address] Phone: [phone]   Fax:  [phone]  Name: MALAISHA SILLIMAN MRN: [id number] Date of Birth: 11-08-1950   PHYSICAL THERAPY DISCHARGE SUMMARY  Visits from Start of Care: 5  Current functional level related to goals / functional outcomes: See above. She appears to received epidural injection last month.     Remaining deficits: Unknown   Education / Equipment: HEP Plan:                                                    Patient goals were not met. Patient is being discharged due to not returning since the last visit.  ?????    Lillette Boxer Chasse   PT  05/17/16   7:01 AM

## 2016-02-14 ENCOUNTER — Encounter (INDEPENDENT_AMBULATORY_CARE_PROVIDER_SITE_OTHER): Payer: Self-pay | Admitting: Orthopaedic Surgery

## 2016-02-14 ENCOUNTER — Ambulatory Visit: Payer: Commercial Managed Care - HMO | Admitting: Physical Therapy

## 2016-02-14 ENCOUNTER — Ambulatory Visit (INDEPENDENT_AMBULATORY_CARE_PROVIDER_SITE_OTHER): Payer: Commercial Managed Care - HMO | Admitting: Orthopaedic Surgery

## 2016-02-14 VITALS — BP 137/93 | HR 101 | Ht 60.0 in | Wt 191.0 lb

## 2016-02-14 DIAGNOSIS — M48061 Spinal stenosis, lumbar region without neurogenic claudication: Secondary | ICD-10-CM | POA: Insufficient documentation

## 2016-02-14 DIAGNOSIS — M48062 Spinal stenosis, lumbar region with neurogenic claudication: Secondary | ICD-10-CM

## 2016-02-14 DIAGNOSIS — M545 Low back pain, unspecified: Secondary | ICD-10-CM

## 2016-02-14 DIAGNOSIS — G8929 Other chronic pain: Secondary | ICD-10-CM

## 2016-02-14 NOTE — Progress Notes (Signed)
Office Visit Note   Patient: Carrie [name]           Date of Birth: 1950/10/25           MRN: [id number] Visit Date: 02/14/2016              Requested by: Janora Norlander, DO [address], [place] [address] PCP: Ronnie Doss, DO   Assessment & Plan: Visit Diagnoses:  1. Chronic left-sided low back pain without sciatica   2. Spinal stenosis of lumbar region with neurogenic claudication     Plan: I recommend proceeding with a new MRI since she's had progression of her symptoms of spinal stenosis. Patient states her pain when she is up and ambulating as a 6 or 7 out of 10. She gets relief with supine position. Chest pain every morning she gets up out of 5 level. By the end of the day her pain is worse. Office follow-up after MRI lumbar for review. Follow-Up Instructions: No Follow-up on file.   Orders:  Orders Placed This Encounter  Procedures  . MR Lumbar Spine w/o contrast   No orders of the defined types were placed in this encounter.     Procedures: No procedures performed   Clinical Data: No additional findings.   Subjective: Chief Complaint  Patient presents with  . Lower Back - Pain    Patient returns for four week follow up low back pain.  She has been going to physical therapy with no relief.  She states that the pain is going down into left thigh.  She takes Tylenol for the pain.    Review of Systems  Constitutional: Negative for chills and diaphoresis.  HENT: Negative for ear discharge, ear pain and nosebleeds.   Eyes: Negative for discharge and visual disturbance.  Respiratory: Negative for cough, choking and shortness of breath.   Cardiovascular: Negative for chest pain and palpitations.  Gastrointestinal: Negative for abdominal distention and abdominal pain.  Endocrine: Negative for cold intolerance and heat intolerance.  Genitourinary: Negative for flank pain and  hematuria.  Skin: Negative for rash and wound.  Neurological: Negative for seizures and speech difficulty.  Hematological: Negative for adenopathy. Does not bruise/bleed easily.  Psychiatric/Behavioral: Negative for agitation and suicidal ideas.     Objective: Vital Signs: BP (!) 137/93   Pulse (!) 101   Ht 5' (1.524 m)   Wt 191 lb (86.6 kg)   BMI 37.30 kg/m   Physical Exam  Constitutional: She is oriented to person, place, and time. She appears well-developed.  HENT:  Head: Normocephalic.  Right Ear: External ear normal.  Left Ear: External ear normal.  Eyes: Pupils are equal, round, and reactive to light.  Neck: No tracheal deviation present. No thyromegaly present.  Cardiovascular: Normal rate.   Pulmonary/Chest: Effort normal.  Abdominal: Soft.  Neurological: She is alert and oriented to person, place, and time.  Skin: Skin is warm and dry.  Psychiatric: She has a normal mood and affect. Her behavior is normal.    Ortho Exam patient some pain and discomfort were straight leg raising at 90 she has tenderness of both greater trochanters. Negative Faber test and pain with hip internal and external rotation these reach full extension. Tib EHL is strong she is able heel and toe walk. She is a mature without limp. Negative Trendelenburg gait. Specialty Comments:  No specialty comments available.  Imaging: No results found.   PMFS History: Patient Active Problem List   Diagnosis  Date Noted  . Spinal stenosis of lumbar region with neurogenic claudication 02/14/2016  . Boil, back 12/25/2015  . Eustachian tube dysfunction 09/04/2015  . Pain in toe of right foot 05/08/2015  . Back pain of thoracolumbar region 05/08/2015  . Left sided abdominal pain 01/10/2015  . Epidermoid cyst of skin 01/10/2015  . Muscle cramping 12/05/2014  . Low back pain 06/14/2014  . Preventative health care 03/15/2014  . Diabetes mellitus screening 02/22/2014  . Bilateral knee pain 02/08/2014  .  Right knee pain 08/04/2013  . Left knee pain 08/04/2013  . Transaminitis 07/23/2013  . Essential hypertension, benign 07/23/2013  . GERD (gastroesophageal reflux disease) 07/23/2013  . Vitamin D deficiency 07/22/2013  . Breast cancer, left breast ([place]) 07/17/2011  . FIBROIDS, UTERUS 01/30/2007  . Alcohol abuse 01/30/2007   Past Medical History:  Diagnosis Date  . Arthritis    "back, right hand" (07/11/2014)  . Breast cancer, right breast ([place]) 05/2009  . Chronic lower back pain   . Hepatic steatosis 07/17/2011  . Hypertension   . Hypokalemia 07/17/2011  . Lower GI bleeding 07/11/2014 hospitalized    Family History  Problem Relation Age of Onset  . Cancer Other   . Diabetes Mother   . Hypertension Mother   . Diabetes Brother   . Hypertension Father   . Arthritis/Rheumatoid Father   . Cancer Maternal Grandfather   . Heart disease Brother     Past Surgical History:  Procedure Laterality Date  . BREAST BIOPSY Right 2011  . BREAST LUMPECTOMY Right 2011  . [place] OF UTERUS  07/2009  . ESOPHAGOGASTRODUODENOSCOPY N/A 07/12/2014   Procedure: ESOPHAGOGASTRODUODENOSCOPY (EGD);  Surgeon: Clarene Essex, MD;  Location: [facility] ENDOSCOPY;  Service: Endoscopy;  Laterality: N/A;  . MASTECTOMY COMPLETE / SIMPLE W/ SENTINEL NODE BIOPSY  05/2009   Archie Endo 05/23/2009   Social History   Occupational History  . Not on file.   Social History Main Topics  . Smoking status: Never Smoker  . Smokeless tobacco: Never Used  . Alcohol use 17.4 oz/week    24 Cans of beer, 5 Shots of liquor per week     Comment: 07/11/2014 "~ 3 , 12oz, beers/day"  . Drug use: No  . Sexual activity: Not Currently    Birth control/ protection: Post-menopausal

## 2016-02-16 ENCOUNTER — Ambulatory Visit: Payer: Commercial Managed Care - HMO

## 2016-02-19 ENCOUNTER — Ambulatory Visit: Payer: Commercial Managed Care - HMO | Admitting: Physical Therapy

## 2016-02-21 ENCOUNTER — Ambulatory Visit: Payer: Commercial Managed Care - HMO

## 2016-02-29 ENCOUNTER — Ambulatory Visit
Admission: RE | Admit: 2016-02-29 | Discharge: 2016-02-29 | Disposition: A | Discharge status: Home / Self Care | Payer: Commercial Managed Care - HMO | Source: Ambulatory Visit | Attending: Orthopaedic Surgery | Admitting: Orthopaedic Surgery

## 2016-02-29 DIAGNOSIS — M48061 Spinal stenosis, lumbar region without neurogenic claudication: Secondary | ICD-10-CM | POA: Diagnosis not present

## 2016-02-29 DIAGNOSIS — M48062 Spinal stenosis, lumbar region with neurogenic claudication: Secondary | ICD-10-CM

## 2016-03-06 ENCOUNTER — Telehealth (INDEPENDENT_AMBULATORY_CARE_PROVIDER_SITE_OTHER): Payer: Self-pay | Admitting: Orthopaedic Surgery

## 2016-03-06 ENCOUNTER — Ambulatory Visit (INDEPENDENT_AMBULATORY_CARE_PROVIDER_SITE_OTHER): Payer: Commercial Managed Care - HMO | Admitting: Orthopaedic Surgery

## 2016-03-06 NOTE — Telephone Encounter (Signed)
Patient called advised she missed her 10:00am appointment today. Patient asked if she can be called with the results of her MRI. The number to contact her is [phone]

## 2016-03-06 NOTE — Telephone Encounter (Signed)
Please advise. Patient was supposed to be here at 10:00 to review MRI.

## 2016-03-06 NOTE — Telephone Encounter (Signed)
OK to read her IMPRESSION on MRI .  Has stenosis and degen of facet joints.

## 2016-03-13 NOTE — Telephone Encounter (Signed)
Patient is coming on 03/14/16 to review

## 2016-03-14 ENCOUNTER — Encounter (INDEPENDENT_AMBULATORY_CARE_PROVIDER_SITE_OTHER): Payer: Self-pay | Admitting: Orthopaedic Surgery

## 2016-03-14 ENCOUNTER — Ambulatory Visit (INDEPENDENT_AMBULATORY_CARE_PROVIDER_SITE_OTHER): Payer: Commercial Managed Care - HMO | Admitting: Orthopaedic Surgery

## 2016-03-14 VITALS — BP 154/88 | HR 88 | Ht 60.0 in | Wt 189.0 lb

## 2016-03-14 DIAGNOSIS — M48062 Spinal stenosis, lumbar region with neurogenic claudication: Secondary | ICD-10-CM | POA: Diagnosis not present

## 2016-03-14 DIAGNOSIS — M545 Low back pain, unspecified: Secondary | ICD-10-CM

## 2016-03-14 DIAGNOSIS — G8929 Other chronic pain: Secondary | ICD-10-CM | POA: Diagnosis not present

## 2016-03-14 NOTE — Progress Notes (Signed)
Office Visit Note   Patient: Carrie [name]           Date of Birth: December 14, 1950           MRN: [id number] Visit Date: 03/14/2016              Requested by: Janora Norlander, DO [address], [place] [address] PCP: Ronnie Doss, DO   Assessment & Plan: Visit Diagnoses:  1. Chronic left-sided low back pain without sciatica   2. Spinal stenosis of lumbar region with neurogenic claudication     Plan: Set up for a epidural L5-S1 the left just lateral recess stenosis and some foraminal narrowing. Plain radiograph shows a little bit more anterolisthesis and the MRI with her in the supine position. She has some narrowing L3-4 which is mildly progressed since previous MRI 2014. Let Dr. Ernestina Patches assessor and then decide but likely is L5-S1 the left is giving her her current symptoms. She has some increased plane with standing and walking consistent with claudication. She'll follow-up with me in 6 weeks after the epidural. We will obtain lateral flexion extension lumbar x-rays in standing position on return.  Follow-Up Instructions: No Follow-up on file.   Orders:  No orders of the defined types were placed in this encounter.  No orders of the defined types were placed in this encounter.     Procedures: No procedures performed   Clinical Data: No additional findings.   Subjective: Chief Complaint  Patient presents with  . Lower Back - Pain    MRI review    Carrie [name] is here to review MRI of lumbar spine.  She states that she has he sam symptoms as last visit, no changes.  Reasons been through physical therapy without improvement. MRI scan has been obtained and she's here for review. She is here with Carrie [name] who went to school with her and has been [place] for many years and added additional history.  Review of Systems 14 for review of systems updated and unchanged from November 2017 office visit. History positive for previous breast  cancer   Objective: Vital Signs: BP (!) 154/88 (BP Location: Left Arm, Patient Position: Sitting)   Pulse 88   Ht 5' (1.524 m)   Wt 189 lb (85.7 kg)   BMI 36.91 kg/m   Physical Exam  Constitutional: She is oriented to person, place, and time. She appears well-developed.  HENT:  Head: Normocephalic.  Right Ear: External ear normal.  Left Ear: External ear normal.  Eyes: Pupils are equal, round, and reactive to light.  Neck: No tracheal deviation present. No thyromegaly present.  Cardiovascular: Normal rate.   Pulmonary/Chest: Effort normal.  Abdominal: Soft.  Musculoskeletal:  Up with hip range of motion knee was straight leg raising 90. Normal heel toe gait normal [place] motor weakness. Tenderness of the trochanters greater on the left than right. There is positive sciatic notch tenderness. Intact reflexes.  Neurological: She is alert and oriented to person, place, and time.  Skin: Skin is warm and dry. Capillary refill takes less than 2 seconds.  Psychiatric: She has a normal mood and affect. Her behavior is normal.    Ortho Exam and ankle jerk are intact no rash or exposed skin no venous stasis changes. These reach full extension good flexion. No knee effusion. Upper extremities wrist finger show no evidence of osteoarthritis.  Specialty Comments:  No specialty comments available.  Imaging: No results found.   PMFS History: Patient Active  Problem List   Diagnosis Date Noted  . Spinal stenosis of lumbar region with neurogenic claudication 02/14/2016  . Boil, back 12/25/2015  . Eustachian tube dysfunction 09/04/2015  . Pain in toe of right foot 05/08/2015  . Back pain of thoracolumbar region 05/08/2015  . Left sided abdominal pain 01/10/2015  . Epidermoid cyst of skin 01/10/2015  . Muscle cramping 12/05/2014  . Low back pain 06/14/2014  . Preventative health care 03/15/2014  . Diabetes mellitus screening 02/22/2014  . Bilateral knee pain 02/08/2014  . Right knee  pain 08/04/2013  . Left knee pain 08/04/2013  . Transaminitis 07/23/2013  . Essential hypertension, benign 07/23/2013  . GERD (gastroesophageal reflux disease) 07/23/2013  . Vitamin D deficiency 07/22/2013  . Breast cancer, left breast ([place]) 07/17/2011  . FIBROIDS, UTERUS 01/30/2007  . Alcohol abuse 01/30/2007   Past Medical History:  Diagnosis Date  . Arthritis    "back, right hand" (07/11/2014)  . Breast cancer, right breast ([place]) 05/2009  . Chronic lower back pain   . Hepatic steatosis 07/17/2011  . Hypertension   . Hypokalemia 07/17/2011  . Lower GI bleeding 07/11/2014 hospitalized    Family History  Problem Relation Age of Onset  . Cancer Other   . Diabetes Mother   . Hypertension Mother   . Diabetes Brother   . Hypertension Father   . Arthritis/Rheumatoid Father   . Cancer Maternal Grandfather   . Heart disease Brother     Past Surgical History:  Procedure Laterality Date  . BREAST BIOPSY Right 2011  . BREAST LUMPECTOMY Right 2011  . [place] OF UTERUS  07/2009  . ESOPHAGOGASTRODUODENOSCOPY N/A 07/12/2014   Procedure: ESOPHAGOGASTRODUODENOSCOPY (EGD);  Surgeon: Clarene Essex, MD;  Location: [facility] ENDOSCOPY;  Service: Endoscopy;  Laterality: N/A;  . MASTECTOMY COMPLETE / SIMPLE W/ SENTINEL NODE BIOPSY  05/2009   Archie Endo 05/23/2009   Social History   Occupational History  . Not on file.   Social History Main Topics  . Smoking status: Never Smoker  . Smokeless tobacco: Never Used  . Alcohol use 17.4 oz/week    24 Cans of beer, 5 Shots of liquor per week     Comment: 07/11/2014 "~ 3 , 12oz, beers/day"  . Drug use: No  . Sexual activity: Not Currently    Birth control/ protection: Post-menopausal

## 2016-04-03 ENCOUNTER — Ambulatory Visit (INDEPENDENT_AMBULATORY_CARE_PROVIDER_SITE_OTHER): Payer: Commercial Managed Care - HMO | Admitting: Orthopaedic Surgery

## 2016-04-08 ENCOUNTER — Encounter (INDEPENDENT_AMBULATORY_CARE_PROVIDER_SITE_OTHER): Payer: Self-pay | Admitting: Physical Medicine and Rehabilitation

## 2016-04-08 ENCOUNTER — Ambulatory Visit (INDEPENDENT_AMBULATORY_CARE_PROVIDER_SITE_OTHER): Payer: Commercial Managed Care - HMO | Admitting: Physical Medicine and Rehabilitation

## 2016-04-08 VITALS — BP 132/84 | HR 93 | Temp 98.5°F

## 2016-04-08 DIAGNOSIS — M5416 Radiculopathy, lumbar region: Secondary | ICD-10-CM

## 2016-04-08 MED ORDER — LIDOCAINE HCL (PF) 1 % IJ SOLN: 0.3300 mL | Freq: Once | INTRAMUSCULAR | Status: DC

## 2016-04-08 MED ORDER — METHYLPREDNISOLONE ACETATE 80 MG/ML IJ SUSP
80.0000 mg | Freq: Once | INTRAMUSCULAR | Status: AC
  Administered 2016-04-08: 80 mg

## 2016-04-08 NOTE — Procedures (Signed)
Lumbar Epidural Steroid Injection - Interlaminar Approach with Fluoroscopic Guidance  Patient: Carrie [name]      Date of Birth: [birth year] MRN: [id number] PCP: Ronnie Doss, DO      Visit Date: 04/08/2016   Universal Protocol:    Date/Time: 04/08/1808:57 AM  Consent Given By: the patient  Position: PRONE  Additional Comments: Vital signs were monitored before and after the procedure. Patient was prepped and draped in the usual sterile fashion. The correct patient, procedure, and site was verified.   Injection Procedure Details:  Procedure Site One Meds Administered:  Meds ordered this encounter  Medications  . lidocaine (PF) (XYLOCAINE) 1 % injection 0.3 mL  . methylPREDNISolone acetate (DEPO-MEDROL) injection 80 mg     Laterality: Left  Location/Site:  L5-S1  Needle size: 20 G  Needle type: Tuohy  Needle Placement: Paramedian epidural  Findings:  -Contrast Used: 1 mL iohexol 180 mg iodine/mL   -Comments: Excellent flow of contrast into the epidural space.  Procedure Details: Using a paramedian approach from the side mentioned above, the region overlying the inferior lamina was localized under fluoroscopic visualization and the soft tissues overlying this structure were infiltrated with 4 ml. of 1% Lidocaine without Epinephrine. The Tuohy needle was inserted into the epidural space using a paramedian approach.   The epidural space was localized using loss of resistance along with lateral and bi-planar fluoroscopic views.  After negative aspirate for air, blood, and CSF, a 2 ml. volume of Isovue-250 was injected into the epidural space and the flow of contrast was observed. Radiographs were obtained for documentation purposes.    The injectate was administered into the level noted above.   Additional Comments:  The patient tolerated the procedure well Dressing: Band-Aid    Post-procedure details: Patient was observed during the procedure. Post-procedure  instructions were reviewed.  Patient left the clinic in stable condition.

## 2016-04-08 NOTE — Progress Notes (Signed)
NIMAH ILL - 66 y.o. female MRN [id number]  Date of birth: 07/13/1950  Office Visit Note: Visit Date: 04/08/2016 PCP: Ronnie Doss, DO Referred by: Janora Norlander, DO  Subjective: Chief Complaint  Patient presents with  . Lower Back - Pain   HPI: Carrie [name] is a 66 year old female with chronic worsening pain across lower back for several years. Worse recently. Denies leg pain. Pain worse with walking and standing. Relief with sitting. She is limited more left than right. Dr. Lorin Mercy requested left L5-S1 interlaminar epidural steroid injection. Reviewing all the images appears like she has somewhat of a transitional segment with a lumbarized S1 segment. Depending on the relief she gets would consider facet joint blocks.    ROS Otherwise per HPI.  Assessment & Plan: Visit Diagnoses:  1. Lumbar radiculopathy     Plan: Findings:  Left L5-S1 interlaminar epidural steroid injection.    Meds & Orders:  Meds ordered this encounter  Medications  . lidocaine (PF) (XYLOCAINE) 1 % injection 0.3 mL  . methylPREDNISolone acetate (DEPO-MEDROL) injection 80 mg    Orders Placed This Encounter  Procedures  . Epidural Steroid injection    Follow-up: Return if symptoms worsen or fail to improve, after 2 weeks, for Scheduled follow-up with Dr. Lorin Mercy and consider facet joint blocks.   Procedures: No procedures performed  Lumbar Epidural Steroid Injection - Interlaminar Approach with Fluoroscopic Guidance  Patient: Carrie [name]      Date of Birth: Aug 09, 1950 MRN: [id number] PCP: Ronnie Doss, DO      Visit Date: 04/08/2016   Universal Protocol:    Date/Time: 04/08/1808:57 AM  Consent Given By: the patient  Position: PRONE  Additional Comments: Vital signs were monitored before and after the procedure. Patient was prepped and draped in the usual sterile fashion. The correct patient, procedure, and site was verified.   Injection Procedure Details:  Procedure Site  One Meds Administered:  Meds ordered this encounter  Medications  . lidocaine (PF) (XYLOCAINE) 1 % injection 0.3 mL  . methylPREDNISolone acetate (DEPO-MEDROL) injection 80 mg     Laterality: Left  Location/Site:  L5-S1  Needle size: 20 G  Needle type: Tuohy  Needle Placement: Paramedian epidural  Findings:  -Contrast Used: 1 mL iohexol 180 mg iodine/mL   -Comments: Excellent flow of contrast into the epidural space.  Procedure Details: Using a paramedian approach from the side mentioned above, the region overlying the inferior lamina was localized under fluoroscopic visualization and the soft tissues overlying this structure were infiltrated with 4 ml. of 1% Lidocaine without Epinephrine. The Tuohy needle was inserted into the epidural space using a paramedian approach.   The epidural space was localized using loss of resistance along with lateral and bi-planar fluoroscopic views.  After negative aspirate for air, blood, and CSF, a 2 ml. volume of Isovue-250 was injected into the epidural space and the flow of contrast was observed. Radiographs were obtained for documentation purposes.    The injectate was administered into the level noted above.   Additional Comments:  The patient tolerated the procedure well Dressing: Band-Aid    Post-procedure details: Patient was observed during the procedure. Post-procedure instructions were reviewed.  Patient left the clinic in stable condition.    Clinical History: Lumbar spine MRI 02/29/2016 IMPRESSION: L3-4: Bilateral facet arthropathy with fluid-filled joints. Disc degeneration with circumferential protrusion of the disc. Multifactorial stenosis that could worsen with standing or flexion, as anterolisthesis could occur at this level based on the facet  morphology.   L4-5: Facet osteoarthritis and disc bulge but without significant stenosis.   L5-S1: Bilateral facet arthropathy with 4 mm of anterolisthesis. Foraminal  stenosis left worse than right. This appearance could worsen with standing or flexion.  She reports that she has never smoked. She has never used smokeless tobacco. No results for input(s): HGBA1C, LABURIC in the last 8760 hours.  Objective:  VS:  HT:    WT:   BMI:     BP:132/84  HR:93bpm  TEMP:98.5 F (36.9 C)(Oral)  RESP:96 % Physical Exam  Musculoskeletal:  Patient ambulates without aid with good distal strength.    Ortho Exam Imaging: No results found.  Past Medical/Family/Surgical/Social History: Medications & Allergies reviewed per EMR Patient Active Problem List   Diagnosis Date Noted  . Spinal stenosis of lumbar region with neurogenic claudication 02/14/2016  . Boil, back 12/25/2015  . Eustachian tube dysfunction 09/04/2015  . Pain in toe of right foot 05/08/2015  . Back pain of thoracolumbar region 05/08/2015  . Left sided abdominal pain 01/10/2015  . Epidermoid cyst of skin 01/10/2015  . Muscle cramping 12/05/2014  . Low back pain 06/14/2014  . Preventative health care 03/15/2014  . Diabetes mellitus screening 02/22/2014  . Bilateral knee pain 02/08/2014  . Right knee pain 08/04/2013  . Left knee pain 08/04/2013  . Transaminitis 07/23/2013  . Essential hypertension, benign 07/23/2013  . GERD (gastroesophageal reflux disease) 07/23/2013  . Vitamin D deficiency 07/22/2013  . Breast cancer, left breast ([place]) 07/17/2011  . FIBROIDS, UTERUS 01/30/2007  . Alcohol abuse 01/30/2007   Past Medical History:  Diagnosis Date  . Arthritis    "back, right hand" (07/11/2014)  . Breast cancer, right breast ([address]) 05/2009  . Chronic lower back pain   . Hepatic steatosis 07/17/2011  . Hypertension   . Hypokalemia 07/17/2011  . Lower GI bleeding 07/11/2014 hospitalized   Family History  Problem Relation Age of Onset  . Cancer Other   . Diabetes Mother   . Hypertension Mother   . Diabetes Brother   . Hypertension Father   . Arthritis/Rheumatoid Father   . Cancer  Maternal Grandfather   . Heart disease Brother    Past Surgical History:  Procedure Laterality Date  . BREAST BIOPSY Right 2011  . BREAST LUMPECTOMY Right 2011  . [place] OF UTERUS  07/2009  . ESOPHAGOGASTRODUODENOSCOPY N/A 07/12/2014   Procedure: ESOPHAGOGASTRODUODENOSCOPY (EGD);  Surgeon: Clarene Essex, MD;  Location: [facility] END[facility]COPY;  Service: Endoscopy;  Laterality: N/A;  . MASTECTOMY COMPLETE / SIMPLE W/ SENTINEL NODE BIOPSY  05/2009   Archie Endo 05/23/2009   Social History   Occupational History  . Not on file.   Social History Main Topics  . Smoking status: Never Smoker  . Smokeless tobacco: Never Used  . Alcohol use 17.4 oz/week    24 Cans of beer, 5 Shots of liquor per week     Comment: 07/11/2014 "~ 3 , 12oz, beers/day"  . Drug use: No  . Sexual activity: Not Currently    Birth control/ protection: Post-menopausal

## 2016-04-08 NOTE — Patient Instructions (Signed)

## 2016-05-14 ENCOUNTER — Ambulatory Visit: Payer: Commercial Managed Care - HMO | Admitting: Family Medicine

## 2016-06-07 ENCOUNTER — Ambulatory Visit (INDEPENDENT_AMBULATORY_CARE_PROVIDER_SITE_OTHER): Payer: Commercial Managed Care - HMO | Admitting: Family Medicine

## 2016-06-07 ENCOUNTER — Telehealth: Payer: Self-pay | Admitting: *Deleted

## 2016-06-07 ENCOUNTER — Other Ambulatory Visit: Payer: Self-pay | Admitting: Family Medicine

## 2016-06-07 ENCOUNTER — Encounter: Payer: Self-pay | Admitting: Family Medicine

## 2016-06-07 VITALS — BP 112/68 | HR 82 | Temp 97.8°F | Ht 60.0 in | Wt 192.6 lb

## 2016-06-07 DIAGNOSIS — G8929 Other chronic pain: Secondary | ICD-10-CM | POA: Diagnosis not present

## 2016-06-07 DIAGNOSIS — M25561 Pain in right knee: Secondary | ICD-10-CM | POA: Diagnosis not present

## 2016-06-07 DIAGNOSIS — R253 Fasciculation: Secondary | ICD-10-CM | POA: Diagnosis not present

## 2016-06-07 DIAGNOSIS — I10 Essential (primary) hypertension: Secondary | ICD-10-CM

## 2016-06-07 DIAGNOSIS — J302 Other seasonal allergic rhinitis: Secondary | ICD-10-CM

## 2016-06-07 DIAGNOSIS — M25562 Pain in left knee: Secondary | ICD-10-CM

## 2016-06-07 DIAGNOSIS — K219 Gastro-esophageal reflux disease without esophagitis: Secondary | ICD-10-CM

## 2016-06-07 LAB — COMPLETE METABOLIC PANEL WITH GFR
ALBUMIN: 4.3 g/dL (ref 3.6–5.1)
ALK PHOS: 60 U/L (ref 33–130)
ALT: 24 U/L (ref 6–29)
AST: 39 U/L — ABNORMAL HIGH (ref 10–35)
BILIRUBIN TOTAL: 0.3 mg/dL (ref 0.2–1.2)
BUN: 9 mg/dL (ref 7–25)
CO2: 27 mmol/L (ref 20–31)
Calcium: 9.4 mg/dL (ref 8.6–10.4)
Chloride: 101 mmol/L (ref 98–110)
Creat: 0.72 mg/dL (ref 0.50–0.99)
GFR, EST NON AFRICAN AMERICAN: 89 mL/min (ref >=60)
GLUCOSE: 91 mg/dL (ref 65–99)
POTASSIUM: 3.9 mmol/L (ref 3.5–5.3)
SODIUM: 140 mmol/L (ref 135–146)
Total Protein: 7.8 g/dL (ref 6.1–8.1)

## 2016-06-07 LAB — LIPID PANEL
CHOL/HDL RATIO: 1.9 ratio (ref <5.0)
Cholesterol: 213 mg/dL — ABNORMAL HIGH (ref <200)
HDL: 112 mg/dL (ref >50)
LDL Cholesterol: 65 mg/dL (ref <100)
TRIGLYCERIDES: 180 mg/dL — AB (ref <150)
VLDL: 36 mg/dL — ABNORMAL HIGH (ref <30)

## 2016-06-07 MED ORDER — NASONEX 50 MCG/ACT NA SUSP: 2.0000 | Freq: Every day | NASAL | 12 refills | Status: DC

## 2016-06-07 MED ORDER — LORATADINE 10 MG PO TABS: 10.0000 mg | ORAL_TABLET | Freq: Every day | ORAL | 11 refills | Status: DC

## 2016-06-07 MED ORDER — PANTOPRAZOLE SODIUM 20 MG PO TBEC: 20.0000 mg | DELAYED_RELEASE_TABLET | Freq: Every day | ORAL | 2 refills | Status: DC

## 2016-06-07 MED ORDER — FLUTICASONE PROPIONATE 50 MCG/ACT NA SUSP: 2.0000 | Freq: Every day | NASAL | 6 refills | Status: DC

## 2016-06-07 NOTE — Assessment & Plan Note (Signed)
Right knee with small healing excoriation.  Does not appear infected.  No palpable bony abnormalities.  Supportive care.

## 2016-06-07 NOTE — Progress Notes (Signed)
Subjective: CC: knee and arm pain HPI:Carrie [name] is a 66 y.o. female presenting to clinic today for same day appointment. PCP: Ronnie Doss, DO Concerns today include:  1. Knee pain Patient last seen in 12/2015 for bilateral knee pain.  She had bilateral knee injections performed at that time.  We had discussed referral to ortho for viscosupplementation.  We discussed that long term, frequent steroid injections of her knees would not be a viable solution.  Patient reports that she fell on her right knee a couple of days ago.  She reports she was walking around in the dark in her apartment and missed the bed.  She reports left shoulder pain.  She describes the   2. Cramps Patient reports that she gets "spasms/ cramps" all over.  She reports she gets them in her legs, abdomen.  She reports that these cramps occur up to 2-3 times daily.  She reports that muscle relaxer does not relieve symptoms.  She reports poor intake of leafy greens/ fruits.  3. Allergies Patient reports sneezing, occ cough, post nasal drip.  Nasonex works well to control symptoms.  4. Reflux Patient reports that she is out of her PPI.  She has been using TUMS with some relief but symptoms return.  She reports reflux, bloating.  Denies melena, hematochezia, emesis, nausea.  She notes that she never followed up with Dr Estanislado Emms as recommended after EGD in 2016.    Allergies  Allergen Reactions  . Other Other (See Comments)    Seasonal hay fever    Social Hx reviewed: active regular ETOH intake. MedHx, current medications and allergies reviewed.  Please see EMR. ROS: Per HPI  Objective: Office vital signs reviewed. BP 112/68   Pulse 82   Temp 97.8 F (36.6 C) (Oral)   Ht 5' (1.524 m)   Wt 192 lb 9.6 oz (87.4 kg)   SpO2 97%   BMI 37.61 kg/m   Physical Examination:  General: Awake, alert, overweight, No acute distress HEENT: Normal    Nose: nasal turbinates moist, no nasal discharge    Throat: moist mucus  membranes, no erythema, no tonsillar exudate.  Airway is patent Cardio: regular rate and rhythm, S1S2 heard, no murmurs appreciated Pulm: clear to auscultation bilaterally, no wheezes, rhonchi or rales; normal work of breathing on room air GI: soft, non-tender, non-distended, bowel sounds present x4, no hepatomegaly, no splenomegaly, no masses MSK: normal gait and normal station, FROM, right knee with healing excoriation, mild effusion. No joint line TTP, no ligamentous laxity. Crepitus appreciated bilaterally.  Assessment/ Plan: 66 y.o. female   Muscle twitching.  ?Electrolyte deficiency.  Patient is an alcoholic.  She is at increased risk of vitamin deficiency.  ?psychogenic vs akathisia  - COMPLETE METABOLIC PANEL WITH GFR  Essential hypertension, benign, controlled. - COMPLETE METABOLIC PANEL WITH GFR - Lipid panel  Chronic seasonal allergic rhinitis, unspecified trigger - NASONEX 50 MCG/ACT nasal spray; Place 2 sprays into the nose daily.  Dispense: 17 g; Refill: 12 - Claritin 10mg  daily  GERD (gastroesophageal reflux disease) Discussed risks of long term use of PPI, including infection, osteoporosis.  Patient accepts these risks and does not wish to discontinue PPI.  She is amenable to decreasing dose, which I have done today.  Protonix 20mg  x4 RF sent to pharmacy.  Recommend that she follow up with GI to have repeat EGD, as this was recommended in past but she has not followed up.  Bilateral knee pain Right knee with small  healing excoriation.  Does not appear infected.  No palpable bony abnormalities.  Supportive care.  Janora Norlander, DO PGY-3, [facility]

## 2016-06-07 NOTE — Assessment & Plan Note (Signed)
Discussed risks of long term use of PPI, including infection, osteoporosis.  Patient accepts these risks and does not wish to discontinue PPI.  She is amenable to decreasing dose, which I have done today.  Protonix 20mg  x4 RF sent to pharmacy.  Recommend that she follow up with GI to have repeat EGD, as this was recommended in past but she has not followed up.

## 2016-06-07 NOTE — Telephone Encounter (Signed)
Prior Authorization received from [employer] for Nasonex. Formulary preferred from [facility]: Fluticasone, Flunisolide, Azelastine.  Please advise. Derl Barrow, RN

## 2016-06-07 NOTE — Patient Instructions (Signed)
I have sent in your Nasonex and Claritin for allergies.  I have also refilled your Protonix but decreased the dose for the reasons we discussed.  I will contact you will the results of your labs.  If anything is abnormal, I will call you.  Otherwise, expect a copy to be mailed to you.

## 2016-06-07 NOTE — Telephone Encounter (Signed)
Order changed to [facility] and sent to pharmacy.

## 2016-06-10 ENCOUNTER — Other Ambulatory Visit: Payer: Self-pay | Admitting: Family Medicine

## 2016-06-10 ENCOUNTER — Encounter: Payer: Self-pay | Admitting: Family Medicine

## 2016-06-24 ENCOUNTER — Encounter: Payer: Self-pay | Admitting: Family Medicine

## 2016-06-24 ENCOUNTER — Ambulatory Visit (INDEPENDENT_AMBULATORY_CARE_PROVIDER_SITE_OTHER): Payer: Medicare HMO | Admitting: Family Medicine

## 2016-06-24 VITALS — BP 101/70 | HR 89 | Temp 97.4°F | Ht 60.0 in | Wt 193.0 lb

## 2016-06-24 DIAGNOSIS — H9312 Tinnitus, left ear: Secondary | ICD-10-CM | POA: Diagnosis not present

## 2016-06-24 DIAGNOSIS — G8929 Other chronic pain: Secondary | ICD-10-CM

## 2016-06-24 DIAGNOSIS — K219 Gastro-esophageal reflux disease without esophagitis: Secondary | ICD-10-CM | POA: Diagnosis not present

## 2016-06-24 DIAGNOSIS — M25561 Pain in right knee: Secondary | ICD-10-CM | POA: Diagnosis not present

## 2016-06-24 DIAGNOSIS — M25562 Pain in left knee: Secondary | ICD-10-CM

## 2016-06-24 DIAGNOSIS — F1010 Alcohol abuse, uncomplicated: Secondary | ICD-10-CM | POA: Diagnosis not present

## 2016-06-24 DIAGNOSIS — Z0000 Encounter for general adult medical examination without abnormal findings: Secondary | ICD-10-CM | POA: Diagnosis not present

## 2016-06-24 DIAGNOSIS — L304 Erythema intertrigo: Secondary | ICD-10-CM

## 2016-06-24 MED ORDER — NYSTATIN 100000 UNIT/GM EX CREA: 1.0000 "application " | TOPICAL_CREAM | Freq: Two times a day (BID) | CUTANEOUS | 0 refills | Status: DC

## 2016-06-24 NOTE — Patient Instructions (Addendum)
Based on your smoking habit, age, high blood pressure, you are recommended to start a cholesterol medication.  You wanted to think about this for now, so we will revisit this topic next visit.  We discussed your alcohol consumption.  The amount you drink puts you at increased risk for health problems.  I recommend that you consider rehabilitation.  If you become interested in quitting alcohol, we can discuss this and I can provide resources.   Alcohol Use Disorder Alcohol use disorder is when your drinking disrupts your daily life. When you have this condition, you drink too much alcohol and you cannot control your drinking. Alcohol use disorder can cause serious problems with your physical health. It can affect your brain, heart, liver, pancreas, immune system, stomach, and intestines. Alcohol use disorder can increase your risk for certain cancers and cause problems with your mental health, such as depression, anxiety, psychosis, delirium, and dementia. People with this disorder risk hurting themselves and others. What are the causes? This condition is caused by drinking too much alcohol over time. It is not caused by drinking too much alcohol only one or two times. Some people with this condition drink alcohol to cope with or escape from negative life events. Others drink to relieve pain or symptoms of mental illness. What increases the risk? You are more likely to develop this condition if:  You have a family history of alcohol use disorder.  Your culture encourages drinking to the point of intoxication, or makes alcohol easy to get.  You had a mood or conduct disorder in childhood.  You have been a victim of abuse.  You are an adolescent and:  You have poor grades or difficulties in school.  Your caregivers do not talk to you about saying no to alcohol, or supervise your activities.  You are impulsive or you have trouble with self-control. What are the signs or symptoms? Symptoms of  this condition include:  Drinkingmore than you want to.  Drinking for longer than you want to.  Trying several times to drink less or to control your drinking.  Spending a lot of time getting alcohol, drinking, or recovering from drinking.  Craving alcohol.  Having problems at work, at school, or at home due to drinking.  Having problems in relationships due to drinking.  Drinking when it is dangerous to drink, such as before driving a car.  Continuing to drink even though you know you might have a physical or mental problem related to drinking.  Needing more and more alcohol to get the same effect you want from the alcohol (building up tolerance).  Having symptoms of withdrawal when you stop drinking. Symptoms of withdrawal include:  Fatigue.  Nightmares.  Trouble sleeping.  Depression.  Anxiety.  Fever.  Seizures.  Severe confusion.  Feeling or seeing things that are not there (hallucinations).  Tremors.  Rapid heart rate.  Rapid breathing.  High blood pressure.  Drinking to avoid symptoms of withdrawal. How is this diagnosed? This condition is diagnosed with an assessment. Your health care provider may start the assessment by asking three or four questions about your drinking. Your health care provider may perform a physical exam or do lab tests to see if you have physical problems resulting from alcohol use. She or he may refer you to a mental health professional for evaluation. How is this treated? Some people with alcohol use disorder are able to reduce their alcohol use to low-risk levels. Others need to completely quit drinking  alcohol. When necessary, mental health professionals with specialized training in substance use treatment can help. Your health care provider can help you decide how severe your alcohol use disorder is and what type of treatment you need. The following forms of treatment are available:  Detoxification. Detoxification involves  quitting drinking and using prescription medicines within the first week to help lessen withdrawal symptoms. This treatment is important for people who have had withdrawal symptoms before and for heavy drinkers who are likely to have withdrawal symptoms. Alcohol withdrawal can be dangerous, and in severe cases, it can cause death. Detoxification may be provided in a home, community, or primary care setting, or in a hospital or substance use treatment facility.  Counseling. This treatment is also called talk therapy. It is provided by substance use treatment counselors. A counselor can address the reasons you use alcohol and suggest ways to keep you from drinking again or to prevent problem drinking. The goals of talk therapy are to:  Find healthy activities and ways for you to cope with stress.  Identify and avoid the things that trigger your alcohol use.  Help you learn how to handle cravings.  Medicines.Medicines can help treat alcohol use disorder by:  Decreasing alcohol cravings.  Decreasing the positive feeling you have when you drink alcohol.  Causing an uncomfortable physical reaction when you drink alcohol (aversion therapy).  Support groups. Support groups are led by people who have quit drinking. They provide emotional support, advice, and guidance. These forms of treatment are often combined. Some people with this condition benefit from a combination of treatments provided by specialized substance use treatment centers. Follow these instructions at home:  Take over-the-counter and prescription medicines only as told by your health care provider.  Check with your health care provider before starting any new medicines.  Ask friends and family members not to offer you alcohol.  Avoid situations where alcohol is served, including gatherings where others are drinking alcohol.  Create a plan for what to do when you are tempted to use alcohol.  Find hobbies or activities that you  enjoy that do not include alcohol.  Keep all follow-up visits as told by your health care provider. This is important. How is this prevented?  If you drink, limit alcohol intake to no more than 1 drink a day for nonpregnant women and 2 drinks a day for men. One drink equals 12 oz of beer, 5 oz of wine, or 1 oz of hard liquor.  If you have a mental health condition, get treatment and support.  Do not give alcohol to adolescents.  If you are an adolescent:  Do not drink alcohol.  Do not be afraid to say no if someone offers you alcohol. Speak up about why you do not want to drink. You can be a positive role model for your friends and set a good example for those around you by not drinking alcohol.  If your friends drink, spend time with others who do not drink alcohol. Make new friends who do not use alcohol.  Find healthy ways to manage stress and emotions, such as meditation or deep breathing, exercise, spending time in nature, listening to music, or talking with a trusted friend or family member. Contact a health care provider if:  You are not able to take your medicines as told.  Your symptoms get worse.  You return to drinking alcohol (relapse) and your symptoms get worse. Get help right away if:  You have thoughts about  hurting yourself or others. If you ever feel like you may hurt yourself or others, or have thoughts about taking your own life, get help right away. You can go to your nearest emergency department or call:  Your local emergency services (911 in the [place]  A suicide crisis helpline, such as the [facility] at 903 508 9047. This is open 24 hours a day. Summary  Alcohol use disorder is when your drinking disrupts your daily life. When you have this condition, you drink too much alcohol and you cannot control your drinking.  Treatment may include detoxification, counseling, medicine, and support groups.  Ask friends and family  members not to offer you alcohol. Avoid situations where alcohol is served.  Get help right away if you have thoughts about hurting yourself or others. This information is not intended to replace advice given to you by your health care provider. Make sure you discuss any questions you have with your health care provider. Document Released: 04/25/2004 Document Revised: 12/14/2015 Document Reviewed: 12/14/2015 Elsevier Interactive Patient Education  2017 [employer].

## 2016-06-24 NOTE — Progress Notes (Signed)
Carrie [name] is a 66 y.o. female presents to office today for annual physical exam examination.  Concerns today include:  1. GERD: well controlled with decreased dose PPI 2. Right knee pain: patient notes worsening Right knee pain.  Most notable with going up and down steps.  She describes knee pain as achy.  She denies falls recently.  She reports popping of knee.  3. Left breast: noticed some lumps underneath left breast a few months ago.  No nipple discharge or pain.  She reports lumps are about the same size.   4. Ringing of ear: Left.  Patient notes that this has been going on for 20+ years.  She last saw ENT about 20 years ago and was told there was nothing to be done about this.  She reports ringing is constant.  No loss of hearing.  No dizziness.  Last colonoscopy: 08/2012, benign Last mammogram: 12/2015, Birads 2 Last pap smear: 03/2014, normal HPV neg  Past Medical History:  Diagnosis Date  . Arthritis    "back, right hand" (07/11/2014)  . Breast cancer, right breast ([place]) 05/2009  . Chronic lower back pain   . Hepatic steatosis 07/17/2011  . Hypertension   . Hypokalemia 07/17/2011  . Lower GI bleeding 07/11/2014 hospitalized   Social History   Social History  . Marital status: Single    Spouse name: N/A  . Number of children: N/A  . Years of education: N/A   Occupational History  . Not on file.   Social History Main Topics  . Smoking status: Never Smoker  . Smokeless tobacco: Never Used  . Alcohol use 17.4 oz/week    24 Cans of beer, 5 Shots of liquor per week     Comment: 07/11/2014 "~ 3 , 12oz, beers/day"  . Drug use: No  . Sexual activity: Not Currently    Birth control/ protection: Post-menopausal   Other Topics Concern  . Not on file   Social History Narrative  . No narrative on file   Past Surgical History:  Procedure Laterality Date  . BREAST BIOPSY Right 2011  . BREAST LUMPECTOMY Right 2011  . [place] OF UTERUS  07/2009  .  ESOPHAGOGASTRODUODENOSCOPY N/A 07/12/2014   Procedure: ESOPHAGOGASTRODUODENOSCOPY (EGD);  Surgeon: Clarene Essex, MD;  Location: [facility] ENDOSCOPY;  Service: Endoscopy;  Laterality: N/A;  . MASTECTOMY COMPLETE / SIMPLE W/ SENTINEL NODE BIOPSY  05/2009   Archie Endo 05/23/2009   Family History  Problem Relation Age of Onset  . Cancer Other   . Diabetes Mother   . Hypertension Mother   . Diabetes Brother   . Hypertension Father   . Arthritis/Rheumatoid Father   . Cancer Maternal Grandfather   . Heart disease Brother    ROS: Review of Systems Constitutional: negative Eyes: negative Ears, nose, mouth, throat, and face: negative Respiratory: positive for dyspnea with heavy physical activity Cardiovascular: negative Gastrointestinal: positive for diarrhea when abstains from ETOH Genitourinary:negative Integument/breast: positive for h/o breast cancer in right, notes small bumps under left breast Hematologic/lymphatic: negative Musculoskeletal:positive for arthralgias, back pain and stiff joints Neurological: negative Behavioral/Psych: negative Endocrine: negative Allergic/Immunologic: positive for allergies    Physical exam BP 101/70 (BP Location: Left Arm, Patient Position: Sitting, Cuff Size: Normal)   Pulse 89   Temp 97.4 F (36.3 C) (Oral)   Ht 5' (1.524 m)   Wt 193 lb (87.5 kg)   SpO2 97%   BMI 37.69 kg/m  General appearance: alert, cooperative, appears stated age and  no distress Head: Normocephalic, without obvious abnormality, atraumatic Eyes: negative findings: lids and lashes normal, conjunctivae and sclerae normal, corneas clear and pupils equal, round, reactive to light and accomodation Ears: normal TM's and external ear canals both ears Nose: Nares normal. Septum midline. Mucosa normal. No drainage or sinus tenderness. Throat: lips, mucosa, and tongue normal; teeth and gums normal Neck: no adenopathy, supple, symmetrical, trachea midline and thyroid not enlarged, symmetric, no  tenderness/mass/nodules Back: symmetric, no curvature. ROM normal. No CVA tenderness. Lungs: clear to auscultation bilaterally Breasts: Inspection negative, No nipple retraction or dimpling, No nipple discharge or bleeding, No axillary or supraclavicular adenopathy, right breast with surgically absent nipple and well healed incision.  left breast as above.  see skin for breast bumps Heart: regular rate and rhythm, S1, S2 normal, no murmur, click, rub or gallop Abdomen: soft, non-tender; bowel sounds normal; no masses,  no organomegaly Extremities: extremities normal, atraumatic, no cyanosis or edema; right knee without edema, effusion.  +crepitus. Negative ligamentous laxity. Pulses: 2+ and symmetric Skin: intertrigo under left breast Lymph nodes: Cervical, supraclavicular, and axillary nodes normal. Neurologic: Grossly normal   Assessment/ Plan: Carrie [name] here for annual physical exam.   1. Annual physical exam - histories updated and reviewed.  2. Chronic pain of both knees, may benefit from viscosupplementation - Ambulatory referral to Orthopedic Surgery  3. Gastroesophageal reflux disease, esophagitis presence not specified - doing well on Protonix 20mg  daily - Follow up with Dr Watt Climes  4. Alcohol abuse - Counseling performed today. - Patient contemplative - Will continue to offer assistance  5. Tinnitus of left ear, no acute abnormalities seen on exam today - Ambulatory referral to ENT  6. Intertrigo - nystatin cream (MYCOSTATIN); Apply 1 application topically 2 (two) times daily.  Dispense: 30 g; Refill: 0  Patient to consider statin initiation.  She continues to smoke nontobacco products.  Ashly M. Lajuana Ripple, DO PGY-3, [facility]

## 2016-07-18 DIAGNOSIS — H903 Sensorineural hearing loss, bilateral: Secondary | ICD-10-CM | POA: Diagnosis not present

## 2016-07-18 DIAGNOSIS — H9313 Tinnitus, bilateral: Secondary | ICD-10-CM | POA: Insufficient documentation

## 2016-07-23 ENCOUNTER — Encounter (INDEPENDENT_AMBULATORY_CARE_PROVIDER_SITE_OTHER): Payer: Self-pay | Admitting: Orthopaedic Surgery

## 2016-07-23 ENCOUNTER — Ambulatory Visit (INDEPENDENT_AMBULATORY_CARE_PROVIDER_SITE_OTHER): Payer: Medicare HMO

## 2016-07-23 ENCOUNTER — Encounter (INDEPENDENT_AMBULATORY_CARE_PROVIDER_SITE_OTHER): Payer: Self-pay | Admitting: Orthopedic Surgery

## 2016-07-23 ENCOUNTER — Ambulatory Visit (INDEPENDENT_AMBULATORY_CARE_PROVIDER_SITE_OTHER): Payer: Medicare HMO | Admitting: Orthopaedic Surgery

## 2016-07-23 VITALS — BP 141/94 | HR 85 | Ht 61.0 in | Wt 183.0 lb

## 2016-07-23 DIAGNOSIS — G8929 Other chronic pain: Secondary | ICD-10-CM | POA: Diagnosis not present

## 2016-07-23 DIAGNOSIS — M25562 Pain in left knee: Secondary | ICD-10-CM

## 2016-07-23 DIAGNOSIS — M25512 Pain in left shoulder: Secondary | ICD-10-CM

## 2016-07-23 DIAGNOSIS — M25561 Pain in right knee: Secondary | ICD-10-CM | POA: Diagnosis not present

## 2016-07-23 DIAGNOSIS — M170 Bilateral primary osteoarthritis of knee: Secondary | ICD-10-CM

## 2016-07-23 NOTE — Progress Notes (Signed)
Office Visit Note   Patient: Carrie [name]           Date of Birth: 02-23-1951           MRN: [id number] Visit Date: 07/23/2016              Requested by: Janora Norlander, DO [address], [place] [address] PCP: Ronnie Doss, DO   Assessment & Plan: Visit Diagnoses:  1. Chronic pain of both knees   2. Chronic left shoulder pain   3.     Lumbar spondylosis with mild spinal stenosis-stable  Plan: We'll obtain an arthritis panel due to her multiple joint involvement with arthritic changes most consistent with osteoarthritis on plain radiograph. She has end-stage right knee osteoarthritis and has used multiple injections used a cane in the past exercise program and has pain with daily activities. Patient states she is ready to proceed with the total knee arthroplasty. We looked at the models discussed surgical technique postoperative rehabilitation. Discussed risks of surgery home therapy, outpatient therapy. Discussed length of time out of work and reviewed the radiographs with her.  Follow-Up Instructions: Patient will be scheduled for right total knee arthroplasty.  Orders:  Orders Placed This Encounter  Procedures  . XR KNEE 3 VIEW LEFT  . XR KNEE 3 VIEW RIGHT  . XR Shoulder Left   No orders of the defined types were placed in this encounter.     Procedures: No procedures performed   Clinical Data: No additional findings.   Subjective: Chief Complaint  Patient presents with  . Left Knee - Pain  . Right Knee - Pain  . Left Shoulder - Pain    HPI patient's had progressive knee pain for several years. She said intra-articular injections last years been on multiple anti-inflammatories was recently full strength ibuprofen prescription. Pain bothers her at night she has trouble sleeping as great difficulty walking up and down stairs. Her knee pops buckle sometimes feels weak she's used a cane intermittently and states she needs something done and the  injections no longer work. X-rays show complete loss of medial joint space with erosion of varus deformity tricompartmental degenerative arthritis.  Review of Systems 14 point review of systems updated. Patient has history of eustachian tube dysfunction. She has spinal stenosis with neurogenic claudication with mild narrowing and slight listhesis L3-4 L4-5 L5-S1. Positive for GERD, hypertension. Vitamin D deficiency. Bilateral severe knee osteoarthritis worse on the right knee than left knee.   Objective: Vital Signs: BP (!) 141/94   Pulse 85   Ht 5\' 1"  (1.549 m)   Wt 183 lb (83 kg)   BMI 34.58 kg/m   Physical Exam  Constitutional: She is oriented to person, place, and time. She appears well-developed.  HENT:  Head: Normocephalic.  Right Ear: External ear normal.  Left Ear: External ear normal.  Eyes: Pupils are equal, round, and reactive to light.  Neck: No tracheal deviation present. No thyromegaly present.  Cardiovascular: Normal rate.   Pulmonary/Chest: Effort normal.  Abdominal: Soft.  Musculoskeletal:  No synovitis of the wrist or fingers. Elbows reach full extension and no impingement of the shoulders. She is able get her left shoulder up overhead and pain. Negative drop arm test. Crepitus with range of motion left shoulder none on the right shoulder. Negative subluxation. Peripheral nerves upper extremity ulnar nerve at the elbow median nerve form and wrist are normal. Upper extremity reflexes are 2+. Patient has 2+ knee effusion on the  right. Negative on the left. Mild varus deformity and standing position. Distal pulses are intact. Collateral ligaments are stable but there is some medial laxity of the right knee. Anterior cruciate ligament PCL exam is normal. Significant crepitus with patellofemoral loading and she is unable to extend with pressure applied to the patella and a full extension due to pain and catching. Negative the popliteal Baker's cyst. Has versus minimally tender  tib-fib joint is normal. No plantar foot lesions ankle range of motion is normal.  Neurological: She is alert and oriented to person, place, and time.  Skin: Skin is warm and dry.  Psychiatric: She has a normal mood and affect. Her behavior is normal.    Ortho Exam  Specialty Comments:  No specialty comments available.  Imaging: No results found.   PMFS History: Patient Active Problem List   Diagnosis Date Noted  . Spinal stenosis of lumbar region with neurogenic claudication 02/14/2016  . Eustachian tube dysfunction 09/04/2015  . Back pain of thoracolumbar region 05/08/2015  . Epidermoid cyst of skin 01/10/2015  . Muscle cramping 12/05/2014  . Low back pain 06/14/2014  . Preventative health care 03/15/2014  . Diabetes mellitus screening 02/22/2014  . Bilateral knee pain 02/08/2014  . Transaminitis 07/23/2013  . Essential hypertension, benign 07/23/2013  . GERD (gastroesophageal reflux disease) 07/23/2013  . Vitamin D deficiency 07/22/2013  . Breast cancer, left breast ([place]) 07/17/2011  . FIBROIDS, UTERUS 01/30/2007  . Alcohol abuse 01/30/2007   Past Medical History:  Diagnosis Date  . Arthritis    "back, right hand" (07/11/2014)  . Breast cancer, right breast ([place]) 05/2009  . Chronic lower back pain   . Hepatic steatosis 07/17/2011  . Hypertension   . Hypokalemia 07/17/2011  . Lower GI bleeding 07/11/2014 hospitalized    Family History  Problem Relation Age of Onset  . Cancer Other   . Diabetes Mother   . Hypertension Mother   . Diabetes Brother   . Hypertension Father   . Arthritis/Rheumatoid Father   . Cancer Father   . Cancer Maternal Grandfather   . Heart disease Brother     Past Surgical History:  Procedure Laterality Date  . BREAST BIOPSY Right 2011  . BREAST LUMPECTOMY Right 2011  . [place] OF UTERUS  07/2009  . ESOPHAGOGASTRODUODENOSCOPY N/A 07/12/2014   Procedure: ESOPHAGOGASTRODUODENOSCOPY (EGD);  Surgeon: Clarene Essex, MD;  Location:  [facility] ENDOSCOPY;  Service: Endoscopy;  Laterality: N/A;  . MASTECTOMY COMPLETE / SIMPLE W/ SENTINEL NODE BIOPSY  05/2009   Archie Endo 05/23/2009   Social History   Occupational History  . Not on file.   Social History Main Topics  . Smoking status: Never Smoker  . Smokeless tobacco: Never Used  . Alcohol use 8.4 oz/week    5 Shots of liquor, 9 Cans of beer per week     Comment: daily since her 54s  . Drug use: No  . Sexual activity: Not Currently    Birth control/ protection: Post-menopausal

## 2016-07-24 LAB — ANA: Anti Nuclear Antibody(ANA): NEGATIVE

## 2016-07-24 LAB — SEDIMENTATION RATE: Sed Rate: 13 mm/hr (ref 0–30)

## 2016-07-24 LAB — RHEUMATOID FACTOR: Rheumatoid fact SerPl-aCnc: <14 IU/mL (ref <14)

## 2016-07-24 LAB — URIC ACID: Uric Acid, Serum: 5.7 mg/dL (ref 2.5–7.0)

## 2016-07-26 ENCOUNTER — Other Ambulatory Visit: Payer: Self-pay | Admitting: *Deleted

## 2016-07-26 DIAGNOSIS — I10 Essential (primary) hypertension: Secondary | ICD-10-CM

## 2016-07-26 MED ORDER — AMLODIPINE BESYLATE 10 MG PO TABS: 10.0000 mg | ORAL_TABLET | Freq: Every day | ORAL | 5 refills | Status: DC

## 2016-08-05 ENCOUNTER — Ambulatory Visit: Payer: Medicare HMO | Admitting: Student

## 2016-08-14 ENCOUNTER — Encounter: Payer: Self-pay | Admitting: Internal Medicine

## 2016-08-14 ENCOUNTER — Ambulatory Visit (INDEPENDENT_AMBULATORY_CARE_PROVIDER_SITE_OTHER): Payer: Medicare HMO | Admitting: Internal Medicine

## 2016-08-14 ENCOUNTER — Other Ambulatory Visit (INDEPENDENT_AMBULATORY_CARE_PROVIDER_SITE_OTHER): Payer: Self-pay | Admitting: Orthopaedic Surgery

## 2016-08-14 VITALS — BP 118/80 | HR 85 | Temp 98.1°F | Ht 60.0 in | Wt 184.8 lb

## 2016-08-14 DIAGNOSIS — M1711 Unilateral primary osteoarthritis, right knee: Secondary | ICD-10-CM

## 2016-08-14 DIAGNOSIS — R252 Cramp and spasm: Secondary | ICD-10-CM | POA: Diagnosis not present

## 2016-08-14 NOTE — Patient Instructions (Signed)
We are getting some labs today to see if we can figure out a cause for your muscle cramping. I will call you with the results. Please follow up with your PCP for further management.

## 2016-08-14 NOTE — Progress Notes (Signed)
   Subjective:    CAROLYNA YERIAN - 66 y.o. female MRN [id number]  Date of birth: 07/30/1950  HPI  MCKAY BRANDT is here for muscle spasms. Patient reports muscle spasms/cramping for chronically for several months. She reports getting them all over body: feet, legs, abdomen, arms are affected. They can occur in multiple areas at once or localized to one area. She gets them 2-3 times daily. She has tried Potassium without relief but reports this prescription medication was expired. She denies weakness, numbness, or tingling with the cramps. She is an alcoholic and drinks an average of 6 beers per day. She reports poor intake of leafy, green vegetables. Does not take MVI. No recent rashes or fevers.    -  reports that she has never smoked. She has never used smokeless tobacco. - Review of Systems: Per HPI. - Past Medical History: Patient Active Problem List   Diagnosis Date Noted  . Spinal stenosis of lumbar region with neurogenic claudication 02/14/2016  . Eustachian tube dysfunction 09/04/2015  . Back pain of thoracolumbar region 05/08/2015  . Epidermoid cyst of skin 01/10/2015  . Muscle cramping 12/05/2014  . Low back pain 06/14/2014  . Preventative health care 03/15/2014  . Diabetes mellitus screening 02/22/2014  . Bilateral knee pain 02/08/2014  . Transaminitis 07/23/2013  . Essential hypertension, benign 07/23/2013  . GERD (gastroesophageal reflux disease) 07/23/2013  . Vitamin D deficiency 07/22/2013  . Breast cancer, left breast ([place]) 07/17/2011  . FIBROIDS, UTERUS 01/30/2007  . Alcohol abuse 01/30/2007   - Medications: reviewed and updated   Objective:   Physical Exam BP 118/80 (BP Location: Left Arm, Patient Position: Sitting, Cuff Size: Normal)   Pulse 85   Temp 98.1 F (36.7 C) (Oral)   Ht 5' (1.524 m)   Wt 184 lb 12.8 oz (83.8 kg)   SpO2 99%   BMI 36.09 kg/m  Gen: NAD, alert, cooperative with exam, well-appearing MSK: Full ROM of all extremities. No focal tenderness  over muscles.  Neuro: CN II-XII grossly intact. Strength 5/5 in all extremities. Sensation grossly intact in all extremities.    Assessment & Plan:   1. Muscle cramping From chart review, this has been occurring for >2 years. Patient is at risk for vitamin deficiencies due to alcohol abuse. May be related to electrolyte abnormality or dehydration. No neurological red flags on exam or history. No focal tenderness that would benefit from trigger point injection. Potentially psychogenic in nature. Would not treat patient with muscle relaxer given may compound fall risk with frequent alcohol use. Will check some labs for further evaluation. Patient to follow up with PCP for further management.  - CK - ANA, IFA (with reflex) - Comprehensive metabolic panel - CBC - Folate - Vitamin B12   Phill Myron, D.O. 08/14/2016, 11:46 AM PGY-2, [place]

## 2016-08-14 NOTE — Assessment & Plan Note (Signed)
From chart review, this has been occurring for >2 years. Patient is at risk for vitamin deficiencies due to alcohol abuse. May be related to electrolyte abnormality or dehydration. No neurological red flags on exam or history. No focal tenderness that would benefit from trigger point injection. Potentially psychogenic in nature. Would not treat patient with muscle relaxer given may compound fall risk with frequent alcohol use. Will check some labs for further evaluation. Patient to follow up with PCP for further management.  - CK - ANA, IFA (with reflex) - Comprehensive metabolic panel - CBC - Folate - Vitamin B12

## 2016-08-16 LAB — CBC
HEMATOCRIT: 33.7 % — AB (ref 34.0–46.6)
Hemoglobin: 11.1 g/dL (ref 11.1–15.9)
MCH: 28 pg (ref 26.6–33.0)
MCHC: 32.9 g/dL (ref 31.5–35.7)
MCV: 85 fL (ref 79–97)
PLATELETS: 312 10*3/uL (ref 150–379)
RBC: 3.96 x10E6/uL (ref 3.77–5.28)
RDW: 17.3 % — ABNORMAL HIGH (ref 12.3–15.4)
WBC: 5.5 10*3/uL (ref 3.4–10.8)

## 2016-08-16 LAB — COMPREHENSIVE METABOLIC PANEL
ALT: 31 IU/L (ref 0–32)
AST: 42 IU/L — AB (ref 0–40)
Albumin/Globulin Ratio: 1.3 (ref 1.2–2.2)
Albumin: 4.4 g/dL (ref 3.6–4.8)
Alkaline Phosphatase: 50 IU/L (ref 39–117)
BUN/Creatinine Ratio: 13 (ref 12–28)
BUN: 12 mg/dL (ref 8–27)
Bilirubin Total: 0.4 mg/dL (ref 0.0–1.2)
CO2: 23 mmol/L (ref 18–29)
CREATININE: 0.91 mg/dL (ref 0.57–1.00)
Calcium: 10 mg/dL (ref 8.7–10.3)
Chloride: 93 mmol/L — ABNORMAL LOW (ref 96–106)
GFR calc Af Amer: 78 mL/min/{1.73_m2} (ref >59)
GFR calc non Af Amer: 67 mL/min/{1.73_m2} (ref >59)
GLUCOSE: 92 mg/dL (ref 65–99)
Globulin, Total: 3.3 g/dL (ref 1.5–4.5)
Potassium: 3.8 mmol/L (ref 3.5–5.2)
SODIUM: 134 mmol/L (ref 134–144)
Total Protein: 7.7 g/dL (ref 6.0–8.5)

## 2016-08-16 LAB — CK: CK TOTAL: 205 U/L — AB (ref 24–173)

## 2016-08-16 LAB — ANTINUCLEAR ANTIBODIES, IFA: ANA TITER 1: NEGATIVE

## 2016-08-16 LAB — VITAMIN B12: Vitamin B-12: 337 pg/mL (ref 232–1245)

## 2016-08-16 LAB — FOLATE: FOLATE: 14.4 ng/mL (ref >3.0)

## 2016-08-20 ENCOUNTER — Encounter (INDEPENDENT_AMBULATORY_CARE_PROVIDER_SITE_OTHER): Payer: Self-pay | Admitting: Orthopaedic Surgery

## 2016-08-20 ENCOUNTER — Ambulatory Visit (INDEPENDENT_AMBULATORY_CARE_PROVIDER_SITE_OTHER): Payer: Medicare HMO

## 2016-08-20 ENCOUNTER — Ambulatory Visit (INDEPENDENT_AMBULATORY_CARE_PROVIDER_SITE_OTHER): Payer: Medicare HMO | Admitting: Orthopaedic Surgery

## 2016-08-20 VITALS — BP 141/91 | HR 87 | Ht 61.0 in | Wt 184.0 lb

## 2016-08-20 DIAGNOSIS — M79671 Pain in right foot: Secondary | ICD-10-CM

## 2016-08-20 DIAGNOSIS — M1711 Unilateral primary osteoarthritis, right knee: Secondary | ICD-10-CM | POA: Diagnosis not present

## 2016-08-20 DIAGNOSIS — M7542 Impingement syndrome of left shoulder: Secondary | ICD-10-CM | POA: Diagnosis not present

## 2016-08-20 NOTE — Progress Notes (Signed)
Office Visit Note   Patient: Carrie [name]           Date of Birth: [birth year]           MRN: [id number] Visit Date: 08/20/2016              Requested by: Janora Norlander, DO [address], [place] [address] PCP: Janora Norlander, DO   Assessment & Plan: Visit Diagnoses:  1. Pain in right foot   2. Impingement syndrome of left shoulder   3. Unilateral primary osteoarthritis, right knee     Plan: Left shoulder subacromial injection performed with good relief pain. Patient scheduled for right total knee arthroplasty on 10/18/2016. Tendinitis right foot diagnosis discussed and treatment plan outlined.  Follow-Up Instructions: Return in about 1 month (around 09/20/2016).   Orders:  Orders Placed This Encounter  Procedures  . Large Joint Injection/Arthrocentesis  . XR Foot Complete Right   No orders of the defined types were placed in this encounter.     Procedures: Large Joint Inj Date/Time: 08/20/2016 4:05 PM Performed by: [name] Authorized by: [name]   Consent Given by:  Patient Indications:  Pain Location:  Shoulder Site:  L subacromial bursa Needle Size:  22 G Needle Length:  1.5 inches Ultrasound Guidance: No   Fluoroscopic Guidance: No   Arthrogram: No   Medications:  1 mL lidocaine 1 %; 40 mg methylPREDNISolone acetate 40 MG/ML; 4 mL bupivacaine 0.25 % Aspiration Attempted: No   Patient tolerance:  Patient tolerated the procedure well with no immediate complications     Clinical Data: No additional findings.   Subjective: Chief Complaint  Patient presents with  . Left Shoulder - Pain  . Right Foot - Pain    HPI patient scheduled for total knee arthroplasty 10/18/2016 has had increased problems with right medial ankle and foot pain and also left shoulder pain. Left shoulder is been difficult for her to get her arm up overhead wash her hair get dressed. She does not recall a specific injury. She hasn't done any extra  walking to bother her ankle the point strictly with posterior tibial tendon where she symptomatic. Mature with a slightly knee flexed gait due to her knee osteoarthritis with medial compartment where and significant patellofemoral wear. Lab results from last month showed sedimentation rate 13. Acid 5.7 and ANA negative.  Review of Systems 14 point review of systems updated and unchanged other than history of present illness from 07/23/2016 office visit   Objective: Vital Signs: BP (!) 141/91   Pulse 87   Ht 5\' 1"  (1.549 m)   Wt 184 lb (83.5 kg)   BMI 34.77 kg/m   Physical Exam  Constitutional: She is oriented to person, place, and time. She appears well-developed.  HENT:  Head: Normocephalic.  Right Ear: External ear normal.  Left Ear: External ear normal.  Eyes: Pupils are equal, round, and reactive to light.  Neck: No tracheal deviation present. No thyromegaly present.  Cardiovascular: Normal rate.   Pulmonary/Chest: Effort normal.  Abdominal: Soft.  Musculoskeletal:  Positive impingement left shoulder. Right foot is tender along the medial instep and palpation over the posterior tibial tendon pain with resisted posterior tibial function Achilles tendon is normal. Patient stably on follow-up but has pain pointing directly posterior tibial tendon. Positive left shoulder impingement negative right shoulder impingement. Lung the biceps is mildly tender.  Neurological: She is alert and oriented to person, place, and time.  Skin: Skin is warm and dry.  Psychiatric: She has a normal mood and affect. Her behavior is normal.    Ortho Exam  Specialty Comments:  No specialty comments available.  Imaging: No results found.   PMFS History: Patient Active Problem List   Diagnosis Date Noted  . Pain in right foot 09/01/2016  . Impingement syndrome of left shoulder 09/01/2016  . Unilateral primary osteoarthritis, right knee 09/01/2016  . Spinal stenosis of lumbar region with  neurogenic claudication 02/14/2016  . Eustachian tube dysfunction 09/04/2015  . Back pain of thoracolumbar region 05/08/2015  . Epidermoid cyst of skin 01/10/2015  . Muscle cramping 12/05/2014  . Low back pain 06/14/2014  . Preventative health care 03/15/2014  . Diabetes mellitus screening 02/22/2014  . Bilateral knee pain 02/08/2014  . Transaminitis 07/23/2013  . Essential hypertension, benign 07/23/2013  . GERD (gastroesophageal reflux disease) 07/23/2013  . Vitamin D deficiency 07/22/2013  . Breast cancer, left breast ([place]) 07/17/2011  . FIBROIDS, UTERUS 01/30/2007  . Alcohol abuse 01/30/2007   Past Medical History:  Diagnosis Date  . Arthritis    "back, right hand" (07/11/2014)  . Breast cancer, right breast ([place]) 05/2009  . Chronic lower back pain   . Hepatic steatosis 07/17/2011  . Hypertension   . Hypokalemia 07/17/2011  . Lower GI bleeding 07/11/2014 hospitalized    Family History  Problem Relation Age of Onset  . Cancer Other   . Diabetes Mother   . Hypertension Mother   . Diabetes Brother   . Hypertension Father   . Arthritis/Rheumatoid Father   . Cancer Father   . Cancer Maternal Grandfather   . Heart disease Brother     Past Surgical History:  Procedure Laterality Date  . BREAST BIOPSY Right 2011  . BREAST LUMPECTOMY Right 2011  . [place] OF UTERUS  07/2009  . ESOPHAGOGASTRODUODENOSCOPY N/A 07/12/2014   Procedure: ESOPHAGOGASTRODUODENOSCOPY (EGD);  Surgeon: Clarene Essex, MD;  [facility] END[facility]COPY;  Service: Endoscopy;  Laterality: N/A;  . MASTECTOMY COMPLETE / SIMPLE W/ SENTINEL NODE BIOPSY  05/2009   Archie Endo 05/23/2009   Social History   Occupational History  . Not on file.   Social History Main Topics  . Smoking status: Never Smoker  . Smokeless tobacco: Never Used  . Alcohol use 50.4 oz/week    42 Cans of beer, 42 Shots of liquor per week     Comment: daily since her 15s  . Drug use: No  . Sexual activity: Not Currently    Birth  control/ protection: Post-menopausal

## 2016-08-21 ENCOUNTER — Ambulatory Visit: Payer: Medicare HMO | Admitting: Family Medicine

## 2016-08-22 ENCOUNTER — Ambulatory Visit (INDEPENDENT_AMBULATORY_CARE_PROVIDER_SITE_OTHER): Payer: Medicare HMO | Admitting: Internal Medicine

## 2016-08-22 ENCOUNTER — Encounter: Payer: Self-pay | Admitting: Internal Medicine

## 2016-08-22 DIAGNOSIS — R252 Cramp and spasm: Secondary | ICD-10-CM

## 2016-08-22 NOTE — Patient Instructions (Signed)
Please make sure you are drinking plenty of fluids besides alcohol. I would also recommend picking up a daily vitamin for women.  I hope you have a great trip!  -Dr. Brett Albino

## 2016-08-22 NOTE — Progress Notes (Signed)
   [facility] Phone: [phone]  Subjective:  Carrie [name] is a 66 year old female presenting to clinic with muscle spasms. The spasms are located in the feet, toes, legs, and hands. The spasms come and go. She notices that the spasms are worse with drinking alcohol. She is drinking a 6 pack of beer and 1/2 pint of vodka per day. She states she does not drink any other liquids besides alcohol. She never drinks any water, juice, soda, tea. She states she had muscle spasms 2 years ago, but they got better on their own. The muscle spasms worsened again 6 months ago.  She was seen in clinic on 08/14/16 for muscle spasms. She had some labs done, including CBC, CMP, CK, ANA, IFA, folate, and B12. Labs were unremarkable except for a mildly elevated CK to 205. She was advised to start a multivitamin and follow-up with her PCP.  ROS: See HPI for pertinent positives and negatives  Past Medical History- HTN, GERD, hx breast cancer,   Family history reviewed for today's visit. No changes.  Social history- patient is a never smoker. Drinks 6 pack of beer and 1/2 pint of vodka per day.  Objective: BP 124/64   Pulse 74   Temp 98 F (36.7 C) (Oral)   Ht 5' (1.524 m)   Wt 182 lb (82.6 kg)   SpO2 97%   BMI 35.54 kg/m  Gen: NAD, alert, cooperative with exam Msk: No tenderness, edema, erythema of arms, legs, hands, and feet. No pain with passive stretching of the legs. Homan's sign negative.  Assessment/Plan: Muscle Cramping: Patient with diffuse muscle spasms of her hands, feet, toes, and legs for the last 6 months. She is currently drinking a 6 pack of beer and 1/2 pint of vodka per day. She states she drinks no other fluids during the day besides alcohol. Think her muscle spasms are likely related to dehydration. Likely worsened by recent warm weather. Had labs checked at last clinic visit 1 week ago and they were unremarkable. - Discussed in detail the importance of drinking water or  some other liquid throughout the day to rehydrate her. She states she will not drink water, but she is willing to drink gatorade - I suggested that she alternate an alcoholic drink with a gatorade throughout the day. - Advised patient to cut back on alcohol, patient states she is not interested in doing this - Reiterated that patient should take a daily vitamin - Follow-up with PCP as needed   Hyman Bible, MD PGY-2

## 2016-08-22 NOTE — Assessment & Plan Note (Signed)
Patient with diffuse muscle spasms of her hands, feet, toes, and legs for the last 6 months. She is currently drinking a 6 pack of beer and 1/2 pint of vodka per day. She states she drinks no other fluids during the day besides alcohol. Think her muscle spasms are likely related to dehydration. Likely worsened by recent warm weather. Had labs checked at last clinic visit 1 week ago and they were unremarkable. - Discussed in detail the importance of drinking water or some other liquid throughout the day to rehydrate her. She states she will not drink water, but she is willing to drink gatorade - I suggested that she alternate an alcoholic drink with a gatorade throughout the day. - Advised patient to cut back on alcohol, patient states she is not interested in doing this - Reiterated that patient should take a daily vitamin - Follow-up with PCP as needed

## 2016-08-23 ENCOUNTER — Other Ambulatory Visit (INDEPENDENT_AMBULATORY_CARE_PROVIDER_SITE_OTHER): Payer: Self-pay | Admitting: Orthopaedic Surgery

## 2016-09-01 DIAGNOSIS — M7542 Impingement syndrome of left shoulder: Secondary | ICD-10-CM | POA: Insufficient documentation

## 2016-09-01 DIAGNOSIS — M79671 Pain in right foot: Secondary | ICD-10-CM | POA: Insufficient documentation

## 2016-09-01 DIAGNOSIS — M1711 Unilateral primary osteoarthritis, right knee: Secondary | ICD-10-CM | POA: Insufficient documentation

## 2016-09-01 MED ORDER — BUPIVACAINE HCL 0.25 % IJ SOLN
4.0000 mL | INTRAMUSCULAR | Status: AC | PRN
  Administered 2016-08-20: 4 mL via INTRA_ARTICULAR

## 2016-09-01 MED ORDER — METHYLPREDNISOLONE ACETATE 40 MG/ML IJ SUSP
40.0000 mg | INTRAMUSCULAR | Status: AC | PRN
  Administered 2016-08-20: 40 mg via INTRA_ARTICULAR

## 2016-09-01 MED ORDER — LIDOCAINE HCL 1 % IJ SOLN
1.0000 mL | INTRAMUSCULAR | Status: AC | PRN
  Administered 2016-08-20: 1 mL

## 2016-09-24 ENCOUNTER — Other Ambulatory Visit: Payer: Self-pay | Admitting: Family Medicine

## 2016-09-24 DIAGNOSIS — I10 Essential (primary) hypertension: Secondary | ICD-10-CM

## 2016-10-04 ENCOUNTER — Other Ambulatory Visit (HOSPITAL_COMMUNITY): Payer: Self-pay

## 2016-10-04 NOTE — Pre-Procedure Instructions (Signed)
Carrie [name]  10/04/2016      [facility], [place] [address] Phone: [phone] Fax: [phone]   Your procedure is scheduled on Friday, October 18, 2016 at 7:30 AM.   Report to [facility] Entrance "A" Admitting Office at 5:30 AM.   Call this number if you have problems the morning of surgery: [phone]   Questions prior to day of surgery, please call [phone] between 8 & 4 PM.   Remember:  Do not eat food or drink liquids after midnight Thursday, 10/17/16.  Take these medicines the morning of surgery with A SIP OF WATER: Amlodipine (Norvasc), Loratadine (Claritin), Pantoprazole (Protonix), Flonase - if needed  Stop NSAIDS (Ibuprofen, Aleve, etc) 5 days prior to surgery. Do not use Aspirin products 5 days prior to surgery.   Do not wear jewelry, make-up or nail polish.  Do not wear lotions, powders, perfumes or deodorant.  Do not shave 48 hours prior to surgery.   Do not bring valuables to the hospital.  [facility] is not responsible for any belongings or valuables.  Contacts, dentures or bridgework may not be worn into surgery.  Leave your suitcase in the car.  After surgery it may be brought to your room.  For patients admitted to the hospital, discharge time will be determined by your treatment team.  Special instructions:  [facility] - Preparing for Surgery  Before surgery, you can play an important role.  Because skin is not sterile, your skin needs to be as free of germs as possible.  You can reduce the number of germs on you skin by washing with CHG (chlorahexidine gluconate) soap before surgery.  CHG is an antiseptic cleaner which kills germs and bonds with the skin to continue killing germs even after washing.  Please DO NOT use if you have an allergy to CHG or antibacterial soaps.  If your skin becomes reddened/irritated stop using the CHG and inform your  nurse when you arrive at Short Stay.  Do not shave (including legs and underarms) for at least 48 hours prior to the first CHG shower.  You may shave your face.  Please follow these instructions carefully:   1.  Shower with CHG Soap the night before surgery and the                    morning of Surgery.  2.  If you choose to wash your hair, wash your hair first as usual with your       normal shampoo.  3.  After you shampoo, rinse your hair and body thoroughly to remove the shampoo.  4.  Use CHG as you would any other liquid soap.  You can apply chg directly       to the skin and wash gently with scrungie or a clean washcloth.  5.  Apply the CHG Soap to your body ONLY FROM THE NECK DOWN.        Do not use on open wounds or open sores.  Avoid contact with your eyes, ears, mouth and genitals (private parts).  Wash genitals (private parts) with your normal soap.  6.  Wash thoroughly, paying special attention to the area where your surgery        will be performed.  7.  Thoroughly rinse your body with warm water from the neck down.  8.  DO NOT shower/wash with  your normal soap after using and rinsing off       the CHG Soap.  9.  Pat yourself dry with a clean towel.            10.  Wear clean pajamas.            11.  Place clean sheets on your bed the night of your first shower and do not        sleep with pets.  Day of Surgery  Do not apply any lotions/deodorants the morning of surgery.  Please wear clean clothes to the hospital.   Please read over the fact sheets that you were given.

## 2016-10-07 ENCOUNTER — Telehealth (INDEPENDENT_AMBULATORY_CARE_PROVIDER_SITE_OTHER): Payer: Self-pay | Admitting: Orthopaedic Surgery

## 2016-10-07 ENCOUNTER — Ambulatory Visit (HOSPITAL_COMMUNITY)
Admission: RE | Admit: 2016-10-07 | Discharge: 2016-10-07 | Disposition: A | Discharge status: Home / Self Care | Payer: Medicare HMO | Source: Ambulatory Visit | Attending: Orthopaedic Surgery | Admitting: Orthopaedic Surgery

## 2016-10-07 ENCOUNTER — Encounter (HOSPITAL_COMMUNITY): Payer: Self-pay

## 2016-10-07 ENCOUNTER — Encounter (HOSPITAL_COMMUNITY)
Admission: RE | Admit: 2016-10-07 | Discharge: 2016-10-07 | Disposition: A | Discharge status: Home / Self Care | Payer: Medicare HMO | Source: Ambulatory Visit | Attending: Orthopaedic Surgery | Admitting: Orthopaedic Surgery

## 2016-10-07 DIAGNOSIS — M179 Osteoarthritis of knee, unspecified: Secondary | ICD-10-CM

## 2016-10-07 DIAGNOSIS — Z01818 Encounter for other preprocedural examination: Secondary | ICD-10-CM | POA: Diagnosis not present

## 2016-10-07 DIAGNOSIS — M170 Bilateral primary osteoarthritis of knee: Secondary | ICD-10-CM | POA: Insufficient documentation

## 2016-10-07 DIAGNOSIS — Z8679 Personal history of other diseases of the circulatory system: Secondary | ICD-10-CM | POA: Diagnosis not present

## 2016-10-07 DIAGNOSIS — R9431 Abnormal electrocardiogram [ECG] [EKG]: Secondary | ICD-10-CM | POA: Insufficient documentation

## 2016-10-07 DIAGNOSIS — M1710 Unilateral primary osteoarthritis, unspecified knee: Secondary | ICD-10-CM

## 2016-10-07 HISTORY — DX: Gastro-esophageal reflux disease without esophagitis: K21.9

## 2016-10-07 LAB — CBC
HCT: 34.3 % — ABNORMAL LOW (ref 36.0–46.0)
Hemoglobin: 11.5 g/dL — ABNORMAL LOW (ref 12.0–15.0)
MCH: 29.2 pg (ref 26.0–34.0)
MCHC: 33.5 g/dL (ref 30.0–36.0)
MCV: 87.1 fL (ref 78.0–100.0)
PLATELETS: 331 10*3/uL (ref 150–400)
RBC: 3.94 MIL/uL (ref 3.87–5.11)
RDW: 16 % — AB (ref 11.5–15.5)
WBC: 4.4 10*3/uL (ref 4.0–10.5)

## 2016-10-07 LAB — COMPREHENSIVE METABOLIC PANEL
ALT: 33 U/L (ref 14–54)
AST: 40 U/L (ref 15–41)
Albumin: 3.8 g/dL (ref 3.5–5.0)
Alkaline Phosphatase: 54 U/L (ref 38–126)
Anion gap: 10 (ref 5–15)
BUN: 7 mg/dL (ref 6–20)
CO2: 24 mmol/L (ref 22–32)
CREATININE: 0.77 mg/dL (ref 0.44–1.00)
Calcium: 9.3 mg/dL (ref 8.9–10.3)
Chloride: 102 mmol/L (ref 101–111)
GFR calc Af Amer: >60 mL/min (ref >60)
GFR calc non Af Amer: >60 mL/min (ref >60)
GLUCOSE: 104 mg/dL — AB (ref 65–99)
Potassium: 3.7 mmol/L (ref 3.5–5.1)
Sodium: 136 mmol/L (ref 135–145)
Total Bilirubin: 0.5 mg/dL (ref 0.3–1.2)
Total Protein: 7.6 g/dL (ref 6.5–8.1)

## 2016-10-07 LAB — PROTIME-INR
INR: 0.95
Prothrombin Time: 12.6 seconds (ref 11.4–15.2)

## 2016-10-07 LAB — SURGICAL PCR SCREEN
MRSA, PCR: NEGATIVE
Staphylococcus aureus: NEGATIVE

## 2016-10-07 NOTE — Telephone Encounter (Signed)
Returned call to patient left message for call back [phone]

## 2016-10-07 NOTE — Telephone Encounter (Signed)
Can you answer this question for patient? Is that something that has already been decided?

## 2016-10-07 NOTE — Telephone Encounter (Signed)
COULD YOU PLEASE CALL PT WHEN YOU GET A CHANCE, SHE WOULD LIKE TO BE EXPLAINED REGARDING WHAT REHAB SHE WILL BE GOING TO POST SURGERY.  [facility] YOU!  [phone]

## 2016-10-07 NOTE — Progress Notes (Addendum)
Unable to void at PAT appointment, even after given water to drink.  Instructed her to bring in urine on the day of surgery. Voices understanding.

## 2016-10-07 NOTE — Progress Notes (Addendum)
PCP is Dr. Sherene Sires at [facility] Denies ever seeing a cardiologist. Denies ever having a stress test, Echo, Or card cath. Denies any chest pain, fever,or cough. Bp today was 145/98 states she ran out of her bp meds and plans to pick them up today. Rechecked Bp 175/79

## 2016-10-08 NOTE — Progress Notes (Addendum)
Anesthesia Chart Review:  Pt is a 66 year old female scheduled for R total knee arthroplasty on 10/18/2016 with Rodell Perna, M.D.  - Receives primary care from the [facility] family practice [facility].  PMH includes: HTN, hepatic steatosis, GI bleed (2016), breast cancer, GERD. Heavy alcohol use (6 pack of beer and 1/2 pint of vodka per day). Never smoker. BMI 35.  Medications include: Amlodipine, Protonix.  BP (!) 175/79 Comment: will inform Trina, RN  Pulse 74   Temp 36.4 C (Oral)   Resp 18   Ht 5\' 1"  (1.549 m)   Wt 186 lb 1.1 oz (84.4 kg)   SpO2 100%   BMI 35.16 kg/m    - Pt reports being out of her BP med but has plans to get refilled and restart.   Preoperative labs reviewed.   - Pt unable to provide urine sample, will obtain UA DOS  CXR 10/07/16: No evidence for acute cardiopulmonary abnormality  EKG 10/07/16: NSR. Nonspecific T wave abnormality  I notified Cheryl in Dr. Lorin Mercy' office of elevated BP and heavy alcohol consumption.   If BP acceptable DOS, I anticipate pt can proceed as scheduled.   Willeen Cass, FNP-BC [facility] Short Stay Surgical Center/Anesthesiology Phone: [phone] 10/08/2016 3:58 PM

## 2016-10-11 ENCOUNTER — Telehealth (INDEPENDENT_AMBULATORY_CARE_PROVIDER_SITE_OTHER): Payer: Self-pay | Admitting: Radiology

## 2016-10-11 MED ORDER — TRAMADOL HCL 50 MG PO TABS: 50.0000 mg | ORAL_TABLET | Freq: Two times a day (BID) | ORAL | 0 refills | Status: DC | PRN

## 2016-10-11 NOTE — Telephone Encounter (Signed)
Called to pharmacy. I left voicemail for patient advising. °

## 2016-10-11 NOTE — Telephone Encounter (Signed)
Patient left voicemail stating her surgery is scheduled for next week. She has been advised not to take tylenol or ibuprofen from now until then. She would like to know if there is something that you can prescribe her for pain until surgery. Her leg is hurting her really badly.   Please advise.

## 2016-10-11 NOTE — Telephone Encounter (Signed)
I spoke with Carrie [name].  The person she had lined up to care for her after surgery is not going to be available. She would like discharge to SNF post op.  We discussed, we are also in the middle of getting her a new surgery date and once that is complete we can look at her post op plan in more detail.

## 2016-10-11 NOTE — Addendum Note (Signed)
Addended by: Meyer Cory on: 10/11/2016 12:48 PM   Modules accepted: Orders

## 2016-10-11 NOTE — Telephone Encounter (Signed)
Send in ultram 1 po bid prn # 20.  Thanks

## 2016-10-23 ENCOUNTER — Encounter (HOSPITAL_COMMUNITY): Payer: Self-pay | Admitting: Certified Registered Nurse Anesthetist

## 2016-10-23 ENCOUNTER — Inpatient Hospital Stay (HOSPITAL_COMMUNITY)
Admission: RE | Admit: 2016-10-23 | Discharge: 2016-10-25 | DRG: 470 | Disposition: A | Discharge status: Skilled Nursing Facility | Payer: Medicare HMO | Source: Ambulatory Visit | Attending: Orthopaedic Surgery | Admitting: Orthopaedic Surgery

## 2016-10-23 ENCOUNTER — Inpatient Hospital Stay (HOSPITAL_COMMUNITY): Payer: Medicare HMO | Admitting: Emergency Medicine

## 2016-10-23 ENCOUNTER — Inpatient Hospital Stay (HOSPITAL_COMMUNITY): Payer: Medicare HMO | Admitting: Certified Registered Nurse Anesthetist

## 2016-10-23 ENCOUNTER — Encounter (HOSPITAL_COMMUNITY)
Admission: RE | Disposition: A | Discharge status: Skilled Nursing Facility | Payer: Self-pay | Source: Ambulatory Visit | Attending: Orthopaedic Surgery

## 2016-10-23 ENCOUNTER — Inpatient Hospital Stay (HOSPITAL_COMMUNITY): Payer: Medicare HMO

## 2016-10-23 DIAGNOSIS — M1711 Unilateral primary osteoarthritis, right knee: Secondary | ICD-10-CM | POA: Diagnosis present

## 2016-10-23 DIAGNOSIS — G8918 Other acute postprocedural pain: Secondary | ICD-10-CM | POA: Diagnosis not present

## 2016-10-23 DIAGNOSIS — K760 Fatty (change of) liver, not elsewhere classified: Secondary | ICD-10-CM | POA: Diagnosis not present

## 2016-10-23 DIAGNOSIS — G8911 Acute pain due to trauma: Secondary | ICD-10-CM | POA: Diagnosis not present

## 2016-10-23 DIAGNOSIS — K219 Gastro-esophageal reflux disease without esophagitis: Secondary | ICD-10-CM | POA: Diagnosis present

## 2016-10-23 DIAGNOSIS — Z471 Aftercare following joint replacement surgery: Secondary | ICD-10-CM | POA: Diagnosis not present

## 2016-10-23 DIAGNOSIS — S8002XA Contusion of left knee, initial encounter: Secondary | ICD-10-CM | POA: Diagnosis not present

## 2016-10-23 DIAGNOSIS — Z9010 Acquired absence of unspecified breast and nipple: Secondary | ICD-10-CM

## 2016-10-23 DIAGNOSIS — Z853 Personal history of malignant neoplasm of breast: Secondary | ICD-10-CM | POA: Diagnosis not present

## 2016-10-23 DIAGNOSIS — I10 Essential (primary) hypertension: Secondary | ICD-10-CM | POA: Diagnosis not present

## 2016-10-23 DIAGNOSIS — F1010 Alcohol abuse, uncomplicated: Secondary | ICD-10-CM | POA: Diagnosis present

## 2016-10-23 DIAGNOSIS — Z8249 Family history of ischemic heart disease and other diseases of the circulatory system: Secondary | ICD-10-CM | POA: Diagnosis not present

## 2016-10-23 DIAGNOSIS — Z96651 Presence of right artificial knee joint: Secondary | ICD-10-CM | POA: Diagnosis not present

## 2016-10-23 DIAGNOSIS — Z09 Encounter for follow-up examination after completed treatment for conditions other than malignant neoplasm: Secondary | ICD-10-CM

## 2016-10-23 DIAGNOSIS — M1990 Unspecified osteoarthritis, unspecified site: Secondary | ICD-10-CM | POA: Diagnosis not present

## 2016-10-23 HISTORY — PX: TOTAL KNEE ARTHROPLASTY: SHX125

## 2016-10-23 LAB — URINALYSIS, ROUTINE W REFLEX MICROSCOPIC
Bilirubin Urine: NEGATIVE
GLUCOSE, UA: NEGATIVE mg/dL
HGB URINE DIPSTICK: NEGATIVE
Ketones, ur: NEGATIVE mg/dL
Leukocytes, UA: NEGATIVE
Nitrite: NEGATIVE
Protein, ur: NEGATIVE mg/dL
SPECIFIC GRAVITY, URINE: 1.005 (ref 1.005–1.030)
pH: 7 (ref 5.0–8.0)

## 2016-10-23 LAB — COMPREHENSIVE METABOLIC PANEL
ALBUMIN: 4 g/dL (ref 3.5–5.0)
ALK PHOS: 49 U/L (ref 38–126)
ALT: 27 U/L (ref 14–54)
ANION GAP: 10 (ref 5–15)
AST: 51 U/L — ABNORMAL HIGH (ref 15–41)
BILIRUBIN TOTAL: 0.9 mg/dL (ref 0.3–1.2)
CALCIUM: 9 mg/dL (ref 8.9–10.3)
CO2: 22 mmol/L (ref 22–32)
Chloride: 102 mmol/L (ref 101–111)
Creatinine, Ser: 0.85 mg/dL (ref 0.44–1.00)
GFR calc Af Amer: >60 mL/min (ref >60)
GLUCOSE: 109 mg/dL — AB (ref 65–99)
Potassium: 3.6 mmol/L (ref 3.5–5.1)
Sodium: 134 mmol/L — ABNORMAL LOW (ref 135–145)
TOTAL PROTEIN: 7.9 g/dL (ref 6.5–8.1)

## 2016-10-23 LAB — CBC
HEMATOCRIT: 36.4 % (ref 36.0–46.0)
HEMOGLOBIN: 12.1 g/dL (ref 12.0–15.0)
MCH: 28.4 pg (ref 26.0–34.0)
MCHC: 33.2 g/dL (ref 30.0–36.0)
MCV: 85.4 fL (ref 78.0–100.0)
Platelets: 386 10*3/uL (ref 150–400)
RBC: 4.26 MIL/uL (ref 3.87–5.11)
RDW: 16.3 % — ABNORMAL HIGH (ref 11.5–15.5)
WBC: 6 10*3/uL (ref 4.0–10.5)

## 2016-10-23 LAB — APTT: APTT: 31 s (ref 24–36)

## 2016-10-23 LAB — PROTIME-INR
INR: 0.97
Prothrombin Time: 12.9 seconds (ref 11.4–15.2)

## 2016-10-23 SURGERY — ARTHROPLASTY, KNEE, TOTAL
Anesthesia: Spinal | Laterality: Right

## 2016-10-23 MED ORDER — SODIUM CHLORIDE 0.9 % IR SOLN
Status: DC | PRN
  Administered 2016-10-23: 3000 mL

## 2016-10-23 MED ORDER — HYDROMORPHONE HCL 1 MG/ML IJ SOLN
0.5000 mg | INTRAMUSCULAR | Status: DC | PRN
  Administered 2016-10-23: 1 mg via INTRAVENOUS
  Filled 2016-10-23: qty 1

## 2016-10-23 MED ORDER — PROPOFOL 10 MG/ML IV BOLUS
INTRAVENOUS | Status: AC
  Filled 2016-10-23: qty 20

## 2016-10-23 MED ORDER — FOLIC ACID 1 MG PO TABS
1.0000 mg | ORAL_TABLET | Freq: Every day | ORAL | Status: DC
  Administered 2016-10-24 – 2016-10-25 (×2): 1 mg via ORAL
  Filled 2016-10-23 (×2): qty 1

## 2016-10-23 MED ORDER — LACTATED RINGERS IV SOLN
INTRAVENOUS | Status: DC
  Administered 2016-10-23 (×2): via INTRAVENOUS

## 2016-10-23 MED ORDER — BUPIVACAINE-EPINEPHRINE (PF) 0.5% -1:200000 IJ SOLN
INTRAMUSCULAR | Status: DC | PRN
  Administered 2016-10-23: 30 mL via PERINEURAL

## 2016-10-23 MED ORDER — CEFAZOLIN SODIUM-DEXTROSE 1-4 GM/50ML-% IV SOLN
1.0000 g | Freq: Three times a day (TID) | INTRAVENOUS | Status: AC
  Administered 2016-10-23 – 2016-10-24 (×2): 1 g via INTRAVENOUS
  Filled 2016-10-23 (×2): qty 50

## 2016-10-23 MED ORDER — AMLODIPINE BESYLATE 10 MG PO TABS
10.0000 mg | ORAL_TABLET | Freq: Every day | ORAL | Status: DC
  Administered 2016-10-24 – 2016-10-25 (×2): 10 mg via ORAL
  Filled 2016-10-23 (×2): qty 1

## 2016-10-23 MED ORDER — PHENYLEPHRINE 40 MCG/ML (10ML) SYRINGE FOR IV PUSH (FOR BLOOD PRESSURE SUPPORT)
PREFILLED_SYRINGE | INTRAVENOUS | Status: AC
  Filled 2016-10-23: qty 10

## 2016-10-23 MED ORDER — LORATADINE 10 MG PO TABS
10.0000 mg | ORAL_TABLET | Freq: Every day | ORAL | Status: DC
  Administered 2016-10-24 – 2016-10-25 (×2): 10 mg via ORAL
  Filled 2016-10-23 (×2): qty 1

## 2016-10-23 MED ORDER — THIAMINE HCL 100 MG/ML IJ SOLN
100.0000 mg | Freq: Every day | INTRAMUSCULAR | Status: DC
  Filled 2016-10-23: qty 2

## 2016-10-23 MED ORDER — POLYETHYLENE GLYCOL 3350 17 G PO PACK: 17.0000 g | PACK | Freq: Every day | ORAL | Status: DC | PRN

## 2016-10-23 MED ORDER — MIDAZOLAM HCL 5 MG/5ML IJ SOLN
INTRAMUSCULAR | Status: DC | PRN
  Administered 2016-10-23: 2 mg via INTRAVENOUS

## 2016-10-23 MED ORDER — PANTOPRAZOLE SODIUM 20 MG PO TBEC
20.0000 mg | DELAYED_RELEASE_TABLET | Freq: Every day | ORAL | Status: DC
  Administered 2016-10-24 – 2016-10-25 (×2): 20 mg via ORAL
  Filled 2016-10-23 (×2): qty 1

## 2016-10-23 MED ORDER — FLUTICASONE PROPIONATE 50 MCG/ACT NA SUSP
2.0000 | Freq: Every day | NASAL | Status: DC | PRN
  Filled 2016-10-23: qty 16

## 2016-10-23 MED ORDER — MIDAZOLAM HCL 2 MG/2ML IJ SOLN
INTRAMUSCULAR | Status: AC
  Filled 2016-10-23: qty 2

## 2016-10-23 MED ORDER — PROPOFOL 10 MG/ML IV BOLUS
INTRAVENOUS | Status: DC | PRN
  Administered 2016-10-23: 20 mg via INTRAVENOUS

## 2016-10-23 MED ORDER — OXYCODONE HCL 5 MG PO TABS
5.0000 mg | ORAL_TABLET | ORAL | Status: DC | PRN
  Administered 2016-10-23 – 2016-10-25 (×7): 10 mg via ORAL
  Filled 2016-10-23 (×6): qty 2

## 2016-10-23 MED ORDER — BUPIVACAINE HCL (PF) 0.25 % IJ SOLN
INTRAMUSCULAR | Status: AC
  Filled 2016-10-23: qty 30

## 2016-10-23 MED ORDER — DOCUSATE SODIUM 100 MG PO CAPS
100.0000 mg | ORAL_CAPSULE | Freq: Two times a day (BID) | ORAL | Status: DC
  Administered 2016-10-24 – 2016-10-25 (×3): 100 mg via ORAL
  Filled 2016-10-23 (×4): qty 1

## 2016-10-23 MED ORDER — ONDANSETRON HCL 4 MG/2ML IJ SOLN: 4.0000 mg | Freq: Four times a day (QID) | INTRAMUSCULAR | Status: DC | PRN

## 2016-10-23 MED ORDER — FENTANYL CITRATE (PF) 100 MCG/2ML IJ SOLN
50.0000 ug | Freq: Once | INTRAMUSCULAR | Status: AC
  Administered 2016-10-23: 50 ug via INTRAVENOUS
  Filled 2016-10-23: qty 1

## 2016-10-23 MED ORDER — BUPIVACAINE IN DEXTROSE 0.75-8.25 % IT SOLN
INTRATHECAL | Status: DC | PRN
  Administered 2016-10-23: 2 mL via INTRATHECAL

## 2016-10-23 MED ORDER — METOCLOPRAMIDE HCL 5 MG/ML IJ SOLN: 5.0000 mg | Freq: Three times a day (TID) | INTRAMUSCULAR | Status: DC | PRN

## 2016-10-23 MED ORDER — 0.9 % SODIUM CHLORIDE (POUR BTL) OPTIME
TOPICAL | Status: DC | PRN
  Administered 2016-10-23: 1000 mL

## 2016-10-23 MED ORDER — MENTHOL 3 MG MT LOZG: 1.0000 | LOZENGE | OROMUCOSAL | Status: DC | PRN

## 2016-10-23 MED ORDER — PHENYLEPHRINE HCL 10 MG/ML IJ SOLN
INTRAMUSCULAR | Status: DC | PRN
  Administered 2016-10-23 (×3): 80 ug via INTRAVENOUS

## 2016-10-23 MED ORDER — GABAPENTIN 300 MG PO CAPS
300.0000 mg | ORAL_CAPSULE | Freq: Once | ORAL | Status: AC
  Administered 2016-10-23: 300 mg via ORAL
  Filled 2016-10-23: qty 1

## 2016-10-23 MED ORDER — METHOCARBAMOL 500 MG PO TABS
500.0000 mg | ORAL_TABLET | Freq: Four times a day (QID) | ORAL | Status: DC | PRN
  Administered 2016-10-23 – 2016-10-25 (×5): 500 mg via ORAL
  Filled 2016-10-23 (×6): qty 1

## 2016-10-23 MED ORDER — ASPIRIN EC 325 MG PO TBEC
325.0000 mg | DELAYED_RELEASE_TABLET | Freq: Every day | ORAL | Status: DC
  Administered 2016-10-24 – 2016-10-25 (×2): 325 mg via ORAL
  Filled 2016-10-23 (×2): qty 1

## 2016-10-23 MED ORDER — LORAZEPAM 1 MG PO TABS: 1.0000 mg | ORAL_TABLET | Freq: Four times a day (QID) | ORAL | Status: DC | PRN

## 2016-10-23 MED ORDER — PROPOFOL 500 MG/50ML IV EMUL
INTRAVENOUS | Status: DC | PRN
  Administered 2016-10-23: 75 ug/kg/min via INTRAVENOUS

## 2016-10-23 MED ORDER — OXYCODONE HCL 5 MG PO TABS
ORAL_TABLET | ORAL | Status: AC
  Administered 2016-10-23: 10 mg via ORAL
  Filled 2016-10-23: qty 2

## 2016-10-23 MED ORDER — BUPIVACAINE HCL (PF) 0.25 % IJ SOLN
INTRAMUSCULAR | Status: DC | PRN
  Administered 2016-10-23: 20 mL

## 2016-10-23 MED ORDER — CEFAZOLIN SODIUM-DEXTROSE 2-4 GM/100ML-% IV SOLN
2.0000 g | INTRAVENOUS | Status: AC
  Administered 2016-10-23: 2 g via INTRAVENOUS
  Filled 2016-10-23: qty 100

## 2016-10-23 MED ORDER — ACETAMINOPHEN 500 MG PO TABS
1000.0000 mg | ORAL_TABLET | Freq: Once | ORAL | Status: AC
  Administered 2016-10-23: 1000 mg via ORAL
  Filled 2016-10-23: qty 2

## 2016-10-23 MED ORDER — DEXTROSE 5 % IV SOLN
500.0000 mg | Freq: Four times a day (QID) | INTRAVENOUS | Status: DC | PRN
  Filled 2016-10-23: qty 5

## 2016-10-23 MED ORDER — VITAMIN B-1 100 MG PO TABS
100.0000 mg | ORAL_TABLET | Freq: Every day | ORAL | Status: DC
  Administered 2016-10-24 – 2016-10-25 (×2): 100 mg via ORAL
  Filled 2016-10-23 (×2): qty 1

## 2016-10-23 MED ORDER — CHLORHEXIDINE GLUCONATE 4 % EX LIQD: 60.0000 mL | Freq: Once | CUTANEOUS | Status: DC

## 2016-10-23 MED ORDER — DEXAMETHASONE SODIUM PHOSPHATE 10 MG/ML IJ SOLN
INTRAMUSCULAR | Status: DC | PRN
  Administered 2016-10-23: 10 mg via INTRAVENOUS

## 2016-10-23 MED ORDER — SODIUM CHLORIDE 0.9 % IV SOLN
INTRAVENOUS | Status: DC
  Administered 2016-10-23: 22:00:00 via INTRAVENOUS

## 2016-10-23 MED ORDER — ONDANSETRON HCL 4 MG/2ML IJ SOLN: 4.0000 mg | Freq: Once | INTRAMUSCULAR | Status: DC | PRN

## 2016-10-23 MED ORDER — MIDAZOLAM HCL 2 MG/2ML IJ SOLN
INTRAMUSCULAR | Status: AC
  Administered 2016-10-23: 2 mg via INTRAVENOUS
  Filled 2016-10-23: qty 2

## 2016-10-23 MED ORDER — FENTANYL CITRATE (PF) 100 MCG/2ML IJ SOLN
INTRAMUSCULAR | Status: AC
  Administered 2016-10-23: 25 ug via INTRAVENOUS
  Filled 2016-10-23: qty 2

## 2016-10-23 MED ORDER — METOCLOPRAMIDE HCL 5 MG PO TABS: 5.0000 mg | ORAL_TABLET | Freq: Three times a day (TID) | ORAL | Status: DC | PRN

## 2016-10-23 MED ORDER — LORAZEPAM 2 MG/ML IJ SOLN: 0.0000 mg | Freq: Two times a day (BID) | INTRAMUSCULAR | Status: DC

## 2016-10-23 MED ORDER — FENTANYL CITRATE (PF) 100 MCG/2ML IJ SOLN
25.0000 ug | INTRAMUSCULAR | Status: DC | PRN
  Administered 2016-10-23: 50 ug via INTRAVENOUS
  Administered 2016-10-23 (×2): 25 ug via INTRAVENOUS

## 2016-10-23 MED ORDER — FENTANYL CITRATE (PF) 100 MCG/2ML IJ SOLN
INTRAMUSCULAR | Status: AC
  Administered 2016-10-23: 50 ug via INTRAVENOUS
  Filled 2016-10-23: qty 2

## 2016-10-23 MED ORDER — LORAZEPAM 2 MG/ML IJ SOLN
0.0000 mg | Freq: Four times a day (QID) | INTRAMUSCULAR | Status: AC
  Administered 2016-10-23 – 2016-10-24 (×3): 2 mg via INTRAVENOUS
  Filled 2016-10-23 (×3): qty 1

## 2016-10-23 MED ORDER — ACETAMINOPHEN 650 MG RE SUPP: 650.0000 mg | Freq: Four times a day (QID) | RECTAL | Status: DC | PRN

## 2016-10-23 MED ORDER — LORAZEPAM 2 MG/ML IJ SOLN: 1.0000 mg | Freq: Four times a day (QID) | INTRAMUSCULAR | Status: DC | PRN

## 2016-10-23 MED ORDER — BUPIVACAINE LIPOSOME 1.3 % IJ SUSP
INTRAMUSCULAR | Status: DC | PRN
  Administered 2016-10-23: 20 mL

## 2016-10-23 MED ORDER — ONDANSETRON HCL 4 MG/2ML IJ SOLN
INTRAMUSCULAR | Status: AC
  Filled 2016-10-23: qty 2

## 2016-10-23 MED ORDER — ONDANSETRON HCL 4 MG/2ML IJ SOLN
INTRAMUSCULAR | Status: DC | PRN
  Administered 2016-10-23: 4 mg via INTRAVENOUS

## 2016-10-23 MED ORDER — BUPIVACAINE LIPOSOME 1.3 % IJ SUSP
20.0000 mL | Freq: Once | INTRAMUSCULAR | Status: DC
  Filled 2016-10-23: qty 20

## 2016-10-23 MED ORDER — ONDANSETRON HCL 4 MG PO TABS: 4.0000 mg | ORAL_TABLET | Freq: Four times a day (QID) | ORAL | Status: DC | PRN

## 2016-10-23 MED ORDER — ACETAMINOPHEN 325 MG PO TABS
650.0000 mg | ORAL_TABLET | Freq: Four times a day (QID) | ORAL | Status: DC | PRN
  Administered 2016-10-25 (×2): 650 mg via ORAL
  Filled 2016-10-23 (×2): qty 2

## 2016-10-23 MED ORDER — MIDAZOLAM HCL 2 MG/2ML IJ SOLN
2.0000 mg | Freq: Once | INTRAMUSCULAR | Status: AC
  Administered 2016-10-23: 2 mg via INTRAVENOUS
  Filled 2016-10-23: qty 2

## 2016-10-23 MED ORDER — METHOCARBAMOL 500 MG PO TABS
ORAL_TABLET | ORAL | Status: AC
  Administered 2016-10-23: 500 mg via ORAL
  Filled 2016-10-23: qty 1

## 2016-10-23 MED ORDER — ADULT MULTIVITAMIN W/MINERALS CH
1.0000 | ORAL_TABLET | Freq: Every day | ORAL | Status: DC
  Administered 2016-10-24 – 2016-10-25 (×2): 1 via ORAL
  Filled 2016-10-23 (×2): qty 1

## 2016-10-23 MED ORDER — PHENOL 1.4 % MT LIQD
1.0000 | OROMUCOSAL | Status: DC | PRN
Start: 2016-10-23 — End: 2016-10-25

## 2016-10-23 MED ORDER — PHENYLEPHRINE HCL 10 MG/ML IJ SOLN
INTRAVENOUS | Status: DC | PRN
  Administered 2016-10-23: 20 ug/min via INTRAVENOUS

## 2016-10-23 MED ORDER — CELECOXIB 200 MG PO CAPS
200.0000 mg | ORAL_CAPSULE | Freq: Once | ORAL | Status: AC
  Administered 2016-10-23: 200 mg via ORAL
  Filled 2016-10-23: qty 1

## 2016-10-23 MED ORDER — FENTANYL CITRATE (PF) 250 MCG/5ML IJ SOLN
INTRAMUSCULAR | Status: AC
  Filled 2016-10-23: qty 5

## 2016-10-23 SURGICAL SUPPLY — 76 items
APL SKNCLS STERI-STRIP NONHPOA (GAUZE/BANDAGES/DRESSINGS) ×1
BANDAGE ACE 4X5 VEL STRL LF (GAUZE/BANDAGES/DRESSINGS) ×3 IMPLANT
BANDAGE ACE 6X5 VEL STRL LF (GAUZE/BANDAGES/DRESSINGS) ×2 IMPLANT
BANDAGE ESMARK 6X9 LF (GAUZE/BANDAGES/DRESSINGS) ×1 IMPLANT
BENZOIN TINCTURE PRP APPL 2/3 (GAUZE/BANDAGES/DRESSINGS) ×3 IMPLANT
BLADE SAGITTAL 25.0X1.19X90 (BLADE) ×2 IMPLANT
BLADE SAGITTAL 25.0X1.19X90MM (BLADE) ×1
BLADE SAW SGTL 13X75X1.27 (BLADE) ×3 IMPLANT
BNDG CMPR 9X6 STRL LF SNTH (GAUZE/BANDAGES/DRESSINGS) ×1
BNDG CMPR MED 10X6 ELC LF (GAUZE/BANDAGES/DRESSINGS) ×1
BNDG ELASTIC 6X10 VLCR STRL LF (GAUZE/BANDAGES/DRESSINGS) ×3 IMPLANT
BNDG ESMARK 6X9 LF (GAUZE/BANDAGES/DRESSINGS) ×3
BOWL SMART MIX CTS (DISPOSABLE) ×3 IMPLANT
CAPT KNEE TOTAL 3 ATTUNE ×2 IMPLANT
CEMENT HV SMART SET (Cement) ×6 IMPLANT
CLOSURE STERI-STRIP 1/2X4 (GAUZE/BANDAGES/DRESSINGS) ×1
CLOSURE WOUND 1/2 X4 (GAUZE/BANDAGES/DRESSINGS) ×2
CLSR STERI-STRIP ANTIMIC 1/2X4 (GAUZE/BANDAGES/DRESSINGS) ×1 IMPLANT
COVER SURGICAL LIGHT HANDLE (MISCELLANEOUS) ×3 IMPLANT
CUFF TOURNIQUET SINGLE 34IN LL (TOURNIQUET CUFF) ×3 IMPLANT
CUFF TOURNIQUET SINGLE 44IN (TOURNIQUET CUFF) IMPLANT
DRAPE ORTHO SPLIT 77X108 STRL (DRAPES) ×6
DRAPE SURG ORHT 6 SPLT 77X108 (DRAPES) ×2 IMPLANT
DRAPE U-SHAPE 47X51 STRL (DRAPES) ×3 IMPLANT
DRSG PAD ABDOMINAL 8X10 ST (GAUZE/BANDAGES/DRESSINGS) ×3 IMPLANT
DURAPREP 26ML APPLICATOR (WOUND CARE) ×4 IMPLANT
ELECT REM PT RETURN 9FT ADLT (ELECTROSURGICAL) ×3
ELECTRODE REM PT RTRN 9FT ADLT (ELECTROSURGICAL) ×1 IMPLANT
EVACUATOR 1/8 PVC DRAIN (DRAIN) IMPLANT
FACESHIELD WRAPAROUND (MASK) ×6 IMPLANT
FACESHIELD WRAPAROUND OR TEAM (MASK) ×2 IMPLANT
GAUZE SPONGE 4X4 12PLY STRL (GAUZE/BANDAGES/DRESSINGS) ×3 IMPLANT
GAUZE SPONGE 4X4 12PLY STRL LF (GAUZE/BANDAGES/DRESSINGS) ×2 IMPLANT
GAUZE XEROFORM 5X9 LF (GAUZE/BANDAGES/DRESSINGS) ×3 IMPLANT
GLOVE BIOGEL PI IND STRL 8 (GLOVE) ×2 IMPLANT
GLOVE BIOGEL PI INDICATOR 8 (GLOVE) ×4
GLOVE ORTHO TXT STRL SZ7.5 (GLOVE) ×6 IMPLANT
GOWN STRL REUS W/ TWL LRG LVL3 (GOWN DISPOSABLE) ×1 IMPLANT
GOWN STRL REUS W/ TWL XL LVL3 (GOWN DISPOSABLE) ×1 IMPLANT
GOWN STRL REUS W/TWL 2XL LVL3 (GOWN DISPOSABLE) ×3 IMPLANT
GOWN STRL REUS W/TWL LRG LVL3 (GOWN DISPOSABLE) ×6
GOWN STRL REUS W/TWL XL LVL3 (GOWN DISPOSABLE) ×3
HANDPIECE INTERPULSE COAX TIP (DISPOSABLE) ×3
IMMOBILIZER KNEE 22 UNIV (SOFTGOODS) ×3 IMPLANT
KIT BASIN OR (CUSTOM PROCEDURE TRAY) ×3 IMPLANT
KIT ROOM TURNOVER OR (KITS) ×3 IMPLANT
MANIFOLD NEPTUNE II (INSTRUMENTS) ×3 IMPLANT
MARKER SKIN DUAL TIP RULER LAB (MISCELLANEOUS) ×3 IMPLANT
NDL 18GX1X1/2 (RX/OR ONLY) (NEEDLE) ×1 IMPLANT
NDL HYPO 25GX1X1/2 BEV (NEEDLE) ×1 IMPLANT
NEEDLE 18GX1X1/2 (RX/OR ONLY) (NEEDLE) ×3 IMPLANT
NEEDLE HYPO 25GX1X1/2 BEV (NEEDLE) IMPLANT
NS IRRIG 1000ML POUR BTL (IV SOLUTION) ×3 IMPLANT
PACK TOTAL JOINT (CUSTOM PROCEDURE TRAY) ×3 IMPLANT
PAD ABD 8X10 STRL (GAUZE/BANDAGES/DRESSINGS) ×2 IMPLANT
PAD ARMBOARD 7.5X6 YLW CONV (MISCELLANEOUS) ×8 IMPLANT
PAD CAST 4YDX4 CTTN HI CHSV (CAST SUPPLIES) ×1 IMPLANT
PADDING CAST COTTON 4X4 STRL (CAST SUPPLIES) ×3
PADDING CAST COTTON 6X4 STRL (CAST SUPPLIES) ×3 IMPLANT
SET HNDPC FAN SPRY TIP SCT (DISPOSABLE) ×1 IMPLANT
STAPLER VISISTAT 35W (STAPLE) ×3 IMPLANT
STRIP CLOSURE SKIN 1/2X4 (GAUZE/BANDAGES/DRESSINGS) ×4 IMPLANT
SUCTION FRAZIER HANDLE 10FR (MISCELLANEOUS) ×2
SUCTION TUBE FRAZIER 10FR DISP (MISCELLANEOUS) ×1 IMPLANT
SUT VIC AB 0 CT1 27 (SUTURE) ×3
SUT VIC AB 0 CT1 27XBRD ANBCTR (SUTURE) ×1 IMPLANT
SUT VIC AB 1 CTX 36 (SUTURE) ×6
SUT VIC AB 1 CTX36XBRD ANBCTR (SUTURE) ×2 IMPLANT
SUT VIC AB 2-0 CT1 27 (SUTURE) ×6
SUT VIC AB 2-0 CT1 TAPERPNT 27 (SUTURE) ×2 IMPLANT
SUT VIC AB 3-0 X1 27 (SUTURE) ×3 IMPLANT
SYR 50ML LL SCALE MARK (SYRINGE) ×3 IMPLANT
SYR CONTROL 10ML LL (SYRINGE) ×3 IMPLANT
TOWEL OR 17X24 6PK STRL BLUE (TOWEL DISPOSABLE) ×3 IMPLANT
TOWEL OR 17X26 10 PK STRL BLUE (TOWEL DISPOSABLE) ×3 IMPLANT
TRAY CATH 16FR W/PLASTIC CATH (SET/KITS/TRAYS/PACK) ×2 IMPLANT

## 2016-10-23 NOTE — Interval H&P Note (Signed)
History and Physical Interval Note:  10/23/2016 1:08 PM  Carrie [name]  has presented today for surgery, with the diagnosis of Right Knee Osteoarthritis   The various methods of treatment have been discussed with the patient and family. After consideration of risks, benefits and other options for treatment, the patient has consented to  Procedure(s): RIGHT TOTAL KNEE ARTHROPLASTY (Right) as a surgical intervention .  The patient's history has been reviewed, patient examined, no change in status, stable for surgery.  I have reviewed the patient's chart and labs.  Questions were answered to the patient's satisfaction.     [name]

## 2016-10-23 NOTE — Op Note (Signed)
Preop diagnosis: Right knee osteoarthritis, primary.  Postop diagnosis: Primary right knee osteoarthritis  Procedure: Cemented right total knee arthroplasty  Surgeon: Rodell Perna M.D.  Assistant: Benjiman Core PA-C medically necessary and present for the entire procedure  Anesthesia: spinal ,    plus expiril  and Marcaine  Tourniquet time 1 hour 3 minutes 350  Procedure after standard prepping draping preoperative Ancef prophylaxis timeout procedure with proximal thigh tourniquet lateral post heel bump the standard exposure was made with the medial parapatellar incision with midline anterior incision. Patello-was everted 10 mm resected intramedullary guide placed on the femur. Depuy attending rotating platform prosthesis was selected size for #3 on the femur which gave excellent fit. 10 mm resected off the femur the 9-10 on the tibia and 5 mm spacer gave full extension. There is more medial erosive where with the grade 4, ratio changes exposed subchondral bone in the medial collateral partial stripping of the deep fibers only. Menisci were resected anterior cruciate ligament PCL was resected. Box cut was made on the femur after chamfer cuts trial sizers inserted full extension 46mm patella. Cement was vacuum mixed tibia cemented first followed by femur insert insertion of the rotating platform and then the patellar button held with clamp. All excessive cement was removed tourniquet was deflated hemostasis obtained standard layer closure after infiltration of Marcaine and extra one-to-one mixture for postoperative analgesia. Patient tolerated the procedure well.

## 2016-10-23 NOTE — H&P (Signed)
TOTAL KNEE ADMISSION H&P  Patient is being admitted for right total knee arthroplasty.  Subjective:  Chief Complaint:right knee pain.  HPI: Carrie [name], 66 y.o. female, has a history of pain and functional disability in the right knee due to arthritis and has failed non-surgical conservative treatments for greater than 12 weeks to includeNSAID's and/or analgesics, corticosteriod injections, use of assistive devices and activity modification.  Onset of symptoms was gradual, starting 10 years ago with gradually worsening course since that time.  Patient currently rates pain in the right knee(s) at 10 out of 10 with activity. Patient has night pain, worsening of pain with activity and weight bearing, crepitus and joint swelling.  Patient has evidence of subchondral sclerosis, periarticular osteophytes and joint space narrowing by imaging studies.  There is no active infection.  Patient Active Problem List   Diagnosis Date Noted  . Pain in right foot 09/01/2016  . Impingement syndrome of left shoulder 09/01/2016  . Unilateral primary osteoarthritis, right knee 09/01/2016  . Spinal stenosis of lumbar region with neurogenic claudication 02/14/2016  . Eustachian tube dysfunction 09/04/2015  . Back pain of thoracolumbar region 05/08/2015  . Epidermoid cyst of skin 01/10/2015  . Muscle cramping 12/05/2014  . Low back pain 06/14/2014  . Preventative health care 03/15/2014  . Diabetes mellitus screening 02/22/2014  . Bilateral knee pain 02/08/2014  . Transaminitis 07/23/2013  . Essential hypertension, benign 07/23/2013  . GERD (gastroesophageal reflux disease) 07/23/2013  . Vitamin D deficiency 07/22/2013  . Breast cancer, left breast ([facility]) 07/17/2011  . FIBROIDS, UTERUS 01/30/2007  . Alcohol abuse 01/30/2007   Past Medical History:  Diagnosis Date  . Arthritis    "back, right hand" (07/11/2014)  . Breast cancer, right breast ([facility]) 05/2009  . Chronic lower back pain   . GERD  (gastroesophageal reflux disease)   . Hepatic steatosis 07/17/2011  . Hypertension   . Hypokalemia 07/17/2011  . Lower GI bleeding 07/11/2014 hospitalized    Past Surgical History:  Procedure Laterality Date  . BREAST BIOPSY Right 2011  . BREAST LUMPECTOMY Right 2011  . COLONOSCOPY    . ESOPHAGOGASTRODUODENOSCOPY N/A 07/12/2014   Procedure: ESOPHAGOGASTRODUODENOSCOPY (EGD);  Surgeon: Clarene Essex, MD;  Location: [facility] ENDOSCOPY;  Service: Endoscopy;  Laterality: N/A;  . FINGER SURGERY     middle finger on left hand  . MASTECTOMY COMPLETE / SIMPLE W/ SENTINEL NODE BIOPSY  05/2009   Archie Endo 05/23/2009    Facility-Administered Medications Prior to Admission  Medication Dose Route Frequency Provider Last Rate Last Dose  . lidocaine (PF) (XYLOCAINE) 1 % injection 0.3 mL  0.3 mL Other Once Magnus Sinning, MD       Prescriptions Prior to Admission  Medication Sig Dispense Refill Last Dose  . amLODipine (NORVASC) 10 MG tablet take 1 tablet by mouth once daily 90 tablet 1 10/23/2016 at 0800  . calcium carbonate (OSCAL) 1500 (600 Ca) MG TABS tablet Take 600 mg by mouth daily.   10/22/2016 at Unknown time  . ibuprofen (ADVIL,MOTRIN) 200 MG tablet Take 800 mg by mouth every 8 (eight) hours as needed (for pain.).   Past Month at Unknown time  . loratadine (CLARITIN) 10 MG tablet Take 1 tablet (10 mg total) by mouth daily. 30 tablet 11 Past Week at Unknown time  . pantoprazole (PROTONIX) 20 MG tablet Take 1 tablet (20 mg total) by mouth daily. 30 tablet 2 10/23/2016 at 0800  . fluticasone (FLONASE) 50 MCG/ACT nasal spray Place 2 sprays into both nostrils daily. (  Patient taking differently: Place 2 sprays into both nostrils daily as needed (for pain.). ) 16 g 6 More than a month at Unknown time  . nystatin cream (MYCOSTATIN) Apply 1 application topically 2 (two) times daily. (Patient not taking: Reported on 10/01/2016) 30 g 0 Not Taking at Unknown time  . traMADol (ULTRAM) 50 MG tablet Take 1 tablet (50 mg total)  by mouth 2 (two) times daily as needed for moderate pain. 20 tablet 0 More than a month at Unknown time   Allergies  Allergen Reactions  . Other Other (See Comments)    Seasonal hay fever    Social History  Substance Use Topics  . Smoking status: Never Smoker  . Smokeless tobacco: Never Used  . Alcohol use 50.4 oz/week    42 Cans of beer, 42 Shots of liquor per week     Comment: daily since her 19s several a day     Family History  Problem Relation Age of Onset  . Cancer Other   . Diabetes Mother   . Hypertension Mother   . Diabetes Brother   . Hypertension Father   . Arthritis/Rheumatoid Father   . Cancer Father   . Cancer Maternal Grandfather   . Heart disease Brother      Review of Systems  Constitutional: Negative.   HENT: Negative.   Respiratory: Negative.   Cardiovascular: Negative.   Gastrointestinal: Negative.   Genitourinary: Negative.   Musculoskeletal: Positive for joint pain.  Skin: Negative.   Psychiatric/Behavioral: Negative.     Objective:  Physical Exam  Constitutional: She is oriented to person, place, and time. No distress.  HENT:  Head: Normocephalic and atraumatic.  Eyes: Pupils are equal, round, and reactive to light.  Neck: Normal range of motion.  Respiratory: No respiratory distress.  GI: She exhibits no distension.  Musculoskeletal: She exhibits tenderness.  Neurological: She is alert and oriented to person, place, and time.  Skin: Skin is warm and dry.  Psychiatric: She has a normal mood and affect.    Vital signs in last 24 hours: Temp:  [97.5 F (36.4 C)] 97.5 F (36.4 C) (07/25 1013) Pulse Rate:  [77-82] 77 (07/25 1125) Resp:  [11-18] 12 (07/25 1125) BP: (120-128)/(69-83) 120/69 (07/25 1125) SpO2:  [100 %] 100 % (07/25 1125) Weight:  [186 lb 1.1 oz (84.4 kg)] 186 lb 1.1 oz (84.4 kg) (07/25 1013)  Labs:   Estimated body mass index is 35.16 kg/m as calculated from the following:   Height as of this encounter: 5\' 1"   (1.549 m).   Weight as of this encounter: 186 lb 1.1 oz (84.4 kg).   Imaging Review Plain radiographs demonstrate moderate degenerative joint disease of the right knee(s). The overall alignment ismild varus. The bone quality appears to be good for age and reported activity level.  Assessment/Plan:  End stage arthritis, right knee   The patient history, physical examination, clinical judgment of the provider and imaging studies are consistent with end stage degenerative joint disease of the right knee(s) and total knee arthroplasty is deemed medically necessary. The treatment options including medical management, injection therapy arthroscopy and arthroplasty were discussed at length. The risks and benefits of total knee arthroplasty were presented and reviewed. The risks due to aseptic loosening, infection, stiffness, patella tracking problems, thromboembolic complications and other imponderables were discussed. The patient acknowledged the explanation, agreed to proceed with the plan and consent was signed. Patient is being admitted for inpatient treatment for surgery, pain control, PT, OT,  prophylactic antibiotics, VTE prophylaxis, progressive ambulation and ADL's and discharge planning. The patient is planning to be discharged home with home health services

## 2016-10-23 NOTE — Anesthesia Procedure Notes (Signed)
Spinal  Patient location during procedure: OR Start time: 10/23/2016 1:20 PM End time: 10/23/2016 1:22 PM Staffing Anesthesiologist: Catalina Gravel Performed: anesthesiologist  Preanesthetic Checklist Completed: patient identified, surgical consent, pre-op evaluation, timeout performed, IV checked, risks and benefits discussed and monitors and equipment checked Spinal Block Patient position: sitting Prep: site prepped and draped and DuraPrep Patient monitoring: continuous pulse ox and blood pressure Approach: midline Location: L3-4 Needle Needle type: Pencan  Needle gauge: 24 G Assessment Sensory level: T10 Additional Notes Functioning IV was confirmed and monitors were applied. Sterile prep and drape, including hand hygiene, mask and sterile gloves were used. The patient was positioned and the spine was prepped. The skin was anesthetized with lidocaine.  Free flow of clear CSF was obtained prior to injecting local anesthetic into the CSF.  The spinal needle aspirated freely following injection.  The needle was carefully withdrawn.  The patient tolerated the procedure well. Consent was obtained prior to procedure with all questions answered and concerns addressed. Risks including but not limited to bleeding, infection, nerve damage, paralysis, failed block, inadequate analgesia, allergic reaction, high spinal, itching and headache were discussed and the patient wished to proceed.   Hoy Morn, MD

## 2016-10-23 NOTE — Anesthesia Procedure Notes (Signed)
Anesthesia Regional Block: Adductor canal block   Pre-Anesthetic Checklist: ,, timeout performed, Correct Patient, Correct Site, Correct Laterality, Correct Procedure, Correct Position, site marked, Risks and benefits discussed,  Surgical consent,  Pre-op evaluation,  At surgeon's request and post-op pain management  Laterality: Right  Prep: chloraprep       Needles:  Injection technique: Single-shot  Needle Type: Echogenic Needle     Needle Length: 9cm  Needle Gauge: 21     Additional Needles:   Procedures: ultrasound guided,,,,,,,,  Narrative:  Start time: 10/23/2016 11:15 AM End time: 10/23/2016 11:20 AM Injection made incrementally with aspirations every 5 mL.  Performed by: Personally  Anesthesiologist: Catalina Gravel  Additional Notes: No pain on injection. No increased resistance to injection. Injection made in 5cc increments.  Good needle visualization.  Patient tolerated procedure well.

## 2016-10-23 NOTE — Anesthesia Preprocedure Evaluation (Addendum)
Anesthesia Evaluation  Patient identified by MRN, date of birth, ID band Patient awake    Reviewed: Allergy & Precautions, NPO status , Patient's Chart, lab work & pertinent test results  Airway Mallampati: II  TM Distance: >3 FB Neck ROM: Full    Dental  (+) Teeth Intact, Dental Advisory Given, Missing, Chipped,    Pulmonary neg pulmonary ROS,    Pulmonary exam normal breath sounds clear to auscultation       Cardiovascular hypertension, Pt. on medications Normal cardiovascular exam Rhythm:Regular Rate:Normal     Neuro/Psych negative neurological ROS  negative psych ROS   GI/Hepatic GERD  Medicated and Controlled,(+)     substance abuse  alcohol use,   Endo/Other  negative endocrine ROSObesity   Renal/GU negative Renal ROS     Musculoskeletal  (+) Arthritis , Osteoarthritis,    Abdominal   Peds  Hematology negative hematology ROS (+)   Anesthesia Other Findings Day of surgery medications reviewed with the patient.  Reproductive/Obstetrics                            Anesthesia Physical Anesthesia Plan  ASA: III  Anesthesia Plan: Spinal   Post-op Pain Management:  Regional for Post-op pain   Induction:   PONV Risk Score and Plan: 2 and Ondansetron and Dexamethasone  Airway Management Planned:   Additional Equipment:   Intra-op Plan:   Post-operative Plan:   Informed Consent: I have reviewed the patients History and Physical, chart, labs and discussed the procedure including the risks, benefits and alternatives for the proposed anesthesia with the patient or authorized representative who has indicated his/her understanding and acceptance.   Dental advisory given  Plan Discussed with: CRNA, Anesthesiologist and Surgeon  Anesthesia Plan Comments: (Discussed risks and benefits of and differences between spinal and general. Discussed risks of spinal including headache,  backache, failure, bleeding, infection, and nerve damage. Patient consents to spinal. Questions answered. Coagulation studies and platelet count acceptable.)       Anesthesia Quick Evaluation

## 2016-10-23 NOTE — Anesthesia Procedure Notes (Signed)
Procedure Name: MAC Date/Time: 10/23/2016 1:23 PM Performed by: Candis Shine Pre-anesthesia Checklist: Patient identified, Emergency Drugs available, Suction available, Patient being monitored and Timeout performed Patient Re-evaluated:Patient Re-evaluated prior to induction Oxygen Delivery Method: Simple face mask Dental Injury: Teeth and Oropharynx as per pre-operative assessment

## 2016-10-23 NOTE — Progress Notes (Signed)
Orthopedic Tech Progress Note Patient Details:  Carrie [name] 1950-08-04 [id number]  CPM Right Knee CPM Right Knee: On Right Knee Flexion (Degrees): 90 Right Knee Extension (Degrees): 0   Braulio Bosch 10/23/2016, 4:45 PM

## 2016-10-23 NOTE — Anesthesia Postprocedure Evaluation (Signed)
Anesthesia Post Note  Patient: Carrie [name]  Procedure(s) Performed: Procedure(s) (LRB): RIGHT TOTAL KNEE ARTHROPLASTY (Right)     Patient location during evaluation: PACU Anesthesia Type: Spinal Level of consciousness: oriented and awake and alert Pain management: pain level controlled Vital Signs Assessment: post-procedure vital signs reviewed and stable Respiratory status: spontaneous breathing and respiratory function stable Cardiovascular status: blood pressure returned to baseline and stable Postop Assessment: no headache, no backache and spinal receding Anesthetic complications: no    Last Vitals:  Vitals:   10/23/16 1554 10/23/16 1609  BP: 94/61 110/71  Pulse: 67 65  Resp: 17 16  Temp:      Last Pain:  Vitals:   10/23/16 1610  TempSrc:   PainSc: 0-No pain                 Catalina Gravel

## 2016-10-23 NOTE — Transfer of Care (Signed)
Immediate Anesthesia Transfer of Care Note  Patient: Carrie [name]  Procedure(s) Performed: Procedure(s): RIGHT TOTAL KNEE ARTHROPLASTY (Right)  Patient Location: PACU  Anesthesia Type:Spinal and MAC combined with regional for post-op pain  Level of Consciousness: awake, alert  and oriented  Airway & Oxygen Therapy: Patient Spontanous Breathing and Patient connected to face mask oxygen  Post-op Assessment: Report given to RN and Post -op Vital signs reviewed and stable  Post vital signs: Reviewed and stable  Last Vitals:  Vitals:   10/23/16 1235 10/23/16 1540  BP: 127/71   Pulse: 73   Resp: 18   Temp:  (P) 36.7 C    Last Pain:  Vitals:   10/23/16 1540  TempSrc:   PainSc: (P) 0-No pain         Complications: No apparent anesthesia complications

## 2016-10-24 ENCOUNTER — Encounter (HOSPITAL_COMMUNITY): Payer: Self-pay | Admitting: General Practice

## 2016-10-24 LAB — BASIC METABOLIC PANEL
ANION GAP: 9 (ref 5–15)
BUN: 7 mg/dL (ref 6–20)
CHLORIDE: 100 mmol/L — AB (ref 101–111)
CO2: 22 mmol/L (ref 22–32)
Calcium: 8.6 mg/dL — ABNORMAL LOW (ref 8.9–10.3)
Creatinine, Ser: 0.97 mg/dL (ref 0.44–1.00)
GFR calc Af Amer: >60 mL/min (ref >60)
GLUCOSE: 175 mg/dL — AB (ref 65–99)
POTASSIUM: 4 mmol/L (ref 3.5–5.1)
Sodium: 131 mmol/L — ABNORMAL LOW (ref 135–145)

## 2016-10-24 LAB — CBC
HEMATOCRIT: 31 % — AB (ref 36.0–46.0)
HEMOGLOBIN: 10.1 g/dL — AB (ref 12.0–15.0)
MCH: 28.5 pg (ref 26.0–34.0)
MCHC: 32.6 g/dL (ref 30.0–36.0)
MCV: 87.3 fL (ref 78.0–100.0)
PLATELETS: 368 10*3/uL (ref 150–400)
RBC: 3.55 MIL/uL — AB (ref 3.87–5.11)
RDW: 16.3 % — ABNORMAL HIGH (ref 11.5–15.5)
WBC: 13.5 10*3/uL — AB (ref 4.0–10.5)

## 2016-10-24 MED ORDER — IPRATROPIUM-ALBUTEROL 0.5-2.5 (3) MG/3ML IN SOLN
RESPIRATORY_TRACT | Status: AC
  Filled 2016-10-24: qty 3

## 2016-10-24 NOTE — Evaluation (Signed)
Physical Therapy Evaluation Patient Details Name: Carrie [name] MRN: [id number] DOB: [birth year] Today's Date: 10/24/2016   History of Present Illness  66 y.o. female with h/o LBP, breast cancer s/p mastectomy, alcohol abuse admitted for R TKA.   Clinical Impression  Pt is s/p TKA resulting in the deficits listed below (see PT Problem List). Min A to pivot from bed to recliner with RW. Ambulation deferred 2* active bloody drainage at posterior R knee, RN notified. Will attempt ambulation later today.  Pt will benefit from skilled PT to increase their independence and safety with mobility to allow discharge to the venue listed below.      Follow Up Recommendations Supervision for mobility/OOB;SNF;Home health PT (SNF vs HHPT -pt stated she has no help available at home)    Equipment Recommendations  Rolling walker with 5" wheels    Recommendations for Other Services       Precautions / Restrictions Precautions Precautions: Fall;Knee Required Braces or Orthoses: Knee Immobilizer - Right Restrictions Weight Bearing Restrictions: No Other Position/Activity Restrictions: WBAT      Mobility  Bed Mobility Overal bed mobility: Needs Assistance Bed Mobility: Supine to Sit     Supine to sit: Supervision     General bed mobility comments: supervision for safety, pt somewhat impulsive  Transfers Overall transfer level: Needs assistance Equipment used: Rolling walker (2 wheeled) Transfers: Sit to/from [facility] Sit to Stand: Min assist Stand pivot transfers: Min assist       General transfer comment: VCs hand placement, min A to rise/steady; min A to pivot to recliner with RW, ambulation deferred due to significant bloody drainage posterior R knee/calf, dressings saturated, RN notified  Ambulation/Gait                Stairs            Wheelchair Mobility    Modified Rankin (Stroke Patients Only)       Balance Overall balance assessment:  Modified Independent                                           Pertinent Vitals/Pain Pain Assessment: 0-10 Pain Score: 3  Pain Location: R knee Pain Descriptors / Indicators: Sore Pain Intervention(s): Limited activity within patient's tolerance;Monitored during session;Premedicated before session;Ice applied    Home Living Family/patient expects to be discharged to:: Private residence Living Arrangements: Alone     Home Access: Level entry     Home Layout: One level Home Equipment: Shower seat      Prior Function Level of Independence: Independent               Hand Dominance        Extremity/Trunk Assessment   Upper Extremity Assessment Upper Extremity Assessment: Defer to OT evaluation    Lower Extremity Assessment Lower Extremity Assessment: RLE deficits/detail RLE Deficits / Details: SLR 3/5, knee flexion 40* AAROM, ankle WNL, sensation intact to light touch       Communication   Communication: No difficulties  Cognition Arousal/Alertness: Awake/alert Behavior During Therapy: WFL for tasks assessed/performed Overall Cognitive Status: Within Functional Limits for tasks assessed  Oriented x 4, somewhat impulsive and at times slow to respond to questions  General Comments      Exercises Total Joint Exercises Ankle Circles/Pumps: AROM;Both;10 reps;Supine Quad Sets: AROM;Right;5 reps;Supine Heel Slides: AAROM;Right;10 reps;Supine Straight Leg Raises: AROM;AAROM;Right;5 reps;Supine Goniometric ROM: R knee 0-40* AAROM   Assessment/Plan    PT Assessment Patient needs continued PT services  PT Problem List Decreased strength;Decreased range of motion;Decreased activity tolerance;Decreased mobility;Pain;Decreased knowledge of use of DME       PT Treatment Interventions DME instruction;Gait training;Functional mobility training;Therapeutic exercise;Therapeutic  activities;Patient/family education    PT Goals (Current goals can be found in the Care Plan section)  Acute Rehab PT Goals Patient Stated Goal: to walk PT Goal Formulation: With patient Time For Goal Achievement: 11/07/16 Potential to Achieve Goals: Good    Frequency 7X/week   Barriers to discharge Decreased caregiver support      Co-evaluation               AM-PAC PT "6 Clicks" Daily Activity  Outcome Measure Difficulty turning over in bed (including adjusting bedclothes, sheets and blankets)?: A Little Difficulty moving from lying on back to sitting on the side of the bed? : A Little Difficulty sitting down on and standing up from a chair with arms (e.g., wheelchair, bedside commode, etc,.)?: Total Help needed moving to and from a bed to chair (including a wheelchair)?: A Little Help needed walking in hospital room?: A Little Help needed climbing 3-5 steps with a railing? : A Lot 6 Click Score: 15    End of Session Equipment Utilized During Treatment: Gait belt Activity Tolerance: Patient tolerated treatment well Patient left: in chair;with call bell/phone within reach;with nursing/sitter in room Nurse Communication: Mobility status PT Visit Diagnosis: Difficulty in walking, not elsewhere classified (R26.2);Pain Pain - Right/Left: Right Pain - part of body: Knee    Time: 4944-9675 PT Time Calculation (min) (ACUTE ONLY): 16 min   Charges:   PT Evaluation $PT Eval Moderate Complexity: 1 Procedure     PT G Codes:          Carrie [name] 10/24/2016, 10:00 AM [phone]

## 2016-10-24 NOTE — [place] FL2 (Signed)
[place] MEDICAID FL2 LEVEL OF CARE SCREENING TOOL     IDENTIFICATION  Patient Name: Carrie [name] Birthdate: Aug 08, 1950 Sex: female Admission Date (Current Location): 10/23/2016  [facility] [place] Number:  Herbalist and Address:  [facility], [place] [address] [place], [place] [address]      Provider Number: [id number]  Attending Physician Name and Address:  [name], MD  Relative Name and Phone Number:       Current Level of Care: Hospital Recommended Level of Care: [place] Prior Approval Number:    Date Approved/Denied: 10/24/16 PASRR Number: [id number]  Discharge Plan: SNF    Current Diagnoses: Patient Active Problem List   Diagnosis Date Noted  . Arthritis of right knee 10/23/2016  . Pain in right foot 09/01/2016  . Impingement syndrome of left shoulder 09/01/2016  . Unilateral primary osteoarthritis, right knee 09/01/2016  . Spinal stenosis of lumbar region with neurogenic claudication 02/14/2016  . Eustachian tube dysfunction 09/04/2015  . Back pain of thoracolumbar region 05/08/2015  . Epidermoid cyst of skin 01/10/2015  . Muscle cramping 12/05/2014  . Low back pain 06/14/2014  . Preventative health care 03/15/2014  . Diabetes mellitus screening 02/22/2014  . Bilateral knee pain 02/08/2014  . Transaminitis 07/23/2013  . Essential hypertension, benign 07/23/2013  . GERD (gastroesophageal reflux disease) 07/23/2013  . Vitamin D deficiency 07/22/2013  . Breast cancer, left breast ([place]) 07/17/2011  . FIBROIDS, UTERUS 01/30/2007  . Alcohol abuse 01/30/2007    Orientation RESPIRATION BLADDER Height & Weight     Self, Time, Situation, Place  Normal Continent Weight: 186 lb 1.1 oz (84.4 kg) Height:  5\' 1"  (154.9 cm)  BEHAVIORAL SYMPTOMS/MOOD NEUROLOGICAL BOWEL NUTRITION STATUS      Continent Diet (See DC Summary)  AMBULATORY STATUS COMMUNICATION OF NEEDS Skin   Limited Assist Verbally Surgical  wounds (Right Knee Closed Incision, Compression Wrap)                       Personal Care Assistance Level of Assistance  Bathing, Feeding, Dressing Bathing Assistance: Limited assistance Feeding assistance: Independent Dressing Assistance: Limited assistance     Functional Limitations Info             SPECIAL CARE FACTORS FREQUENCY  PT (By licensed PT), OT (By licensed OT)     PT Frequency: 7xweek OT Frequency: 2xweek            Contractures      Additional Factors Info  Code Status, Allergies Code Status Info: Full Code Allergies Info: seasonal hay fever           Current Medications (10/24/2016):  This is the current hospital active medication list Current Facility-Administered Medications  Medication Dose Route Frequency Provider Last Rate Last Dose  . 0.9 %  sodium chloride infusion   Intravenous Continuous Lanae Crumbly, PA-C 85 mL/hr at 10/23/16 2216    . acetaminophen (TYLENOL) tablet 650 mg  650 mg Oral Q6H PRN Lanae Crumbly, PA-C       Or  . acetaminophen (TYLENOL) suppository 650 mg  650 mg Rectal Q6H PRN Lanae Crumbly, PA-C      . amLODipine (NORVASC) tablet 10 mg  10 mg Oral Daily Benjiman Core M, PA-C   10 mg at 10/24/16 1006  . aspirin EC tablet 325 mg  325 mg Oral Q breakfast Lanae Crumbly, PA-C   325 mg at 10/24/16 1006  .  bupivacaine liposome (EXPAREL) 1.3 % injection 266 mg  20 mL Infiltration Once [name], MD      . docusate sodium (COLACE) capsule 100 mg  100 mg Oral BID Lanae Crumbly, PA-C   100 mg at 10/24/16 1006  . fluticasone (FLONASE) 50 MCG/ACT nasal spray 2 spray  2 spray Each Nare Daily PRN Lanae Crumbly, PA-C      . folic acid (FOLVITE) tablet 1 mg  1 mg Oral Daily [name], MD   1 mg at 10/24/16 1006  . HYDROmorphone (DILAUDID) injection 0.5-1 mg  0.5-1 mg Intravenous Q3H PRN Lanae Crumbly, PA-C   1 mg at 10/23/16 2201  . ipratropium-albuterol (DUONEB) 0.5-2.5 (3) MG/3ML nebulizer solution           . lactated  ringers infusion   Intravenous Continuous Catalina Gravel, MD 10 mL/hr at 10/23/16 1023    . loratadine (CLARITIN) tablet 10 mg  10 mg Oral Daily Lanae Crumbly, PA-C   10 mg at 10/24/16 1006  . LORazepam (ATIVAN) injection 0-4 mg  0-4 mg Intravenous Q6H [name], MD   2 mg at 10/24/16 4098   Followed by  . [START ON 10/25/2016] LORazepam (ATIVAN) injection 0-4 mg  0-4 mg Intravenous Q12H [name], MD      . LORazepam (ATIVAN) tablet 1 mg  1 mg Oral Q6H PRN [name], MD       Or  . LORazepam (ATIVAN) injection 1 mg  1 mg Intravenous Q6H PRN [name], MD      . menthol-cetylpyridinium (CEPACOL) lozenge 3 mg  1 lozenge Oral PRN Lanae Crumbly, PA-C       Or  . phenol (CHLORASEPTIC) mouth spray 1 spray  1 spray Mouth/Throat PRN Lanae Crumbly, PA-C      . methocarbamol (ROBAXIN) tablet 500 mg  500 mg Oral Q6H PRN Lanae Crumbly, PA-C   500 mg at 10/24/16 1191   Or  . methocarbamol (ROBAXIN) 500 mg in dextrose 5 % 50 mL IVPB  500 mg Intravenous Q6H PRN Lanae Crumbly, PA-C      . metoCLOPramide (REGLAN) tablet 5-10 mg  5-10 mg Oral Q8H PRN Lanae Crumbly, PA-C       Or  . metoCLOPramide (REGLAN) injection 5-10 mg  5-10 mg Intravenous Q8H PRN Benjiman Core M, PA-C      . multivitamin with minerals tablet 1 tablet  1 tablet Oral Daily [name], MD   1 tablet at 10/24/16 1006  . ondansetron (ZOFRAN) tablet 4 mg  4 mg Oral Q6H PRN Lanae Crumbly, PA-C       Or  . ondansetron [facility]) injection 4 mg  4 mg Intravenous Q6H PRN Lanae Crumbly, PA-C      . oxyCODONE (Oxy IR/ROXICODONE) immediate release tablet 5-10 mg  5-10 mg Oral Q4H PRN Lanae Crumbly, PA-C   10 mg at 10/24/16 4782  . pantoprazole (PROTONIX) EC tablet 20 mg  20 mg Oral Daily Benjiman Core M, PA-C      . polyethylene glycol (MIRALAX / GLYCOLAX) packet 17 g  17 g Oral Daily PRN Benjiman Core M, PA-C      . thiamine (VITAMIN B-1) tablet 100 mg  100 mg Oral Daily [name], MD   100 mg at 10/24/16 1006      Discharge Medications: Please see discharge summary for a list of discharge medications.  Relevant Imaging Results:  Relevant Lab Results:   Additional Information SS#:[id number] [place], LCSW

## 2016-10-24 NOTE — Progress Notes (Signed)
   Subjective: 1 Day Post-Op Procedure(s) (LRB): RIGHT TOTAL KNEE ARTHROPLASTY (Right) Patient reports pain as mild and moderate.    Objective: Vital signs in last 24 hours: Temp:  [97.5 F (36.4 C)-98.6 F (37 C)] 97.9 F (36.6 C) (07/26 0543) Pulse Rate:  [64-91] 91 (07/26 0543) Resp:  [11-24] 18 (07/26 0543) BP: (94-149)/(61-90) 134/82 (07/26 0543) SpO2:  [96 %-100 %] 100 % (07/26 0543) Weight:  [186 lb 1.1 oz (84.4 kg)] 186 lb 1.1 oz (84.4 kg) (07/25 1013)  Intake/Output from previous day: 07/25 0701 - 07/26 0700 In: 1452.3 [I.V.:1402.3; IV Piggyback:50] Out: 150 [Urine:125; Blood:25] Intake/Output this shift: No intake/output data recorded.   Recent Labs  10/23/16 1000 10/24/16 0330  HGB 12.1 10.1*    Recent Labs  10/23/16 1000 10/24/16 0330  WBC 6.0 13.5*  RBC 4.26 3.55*  HCT 36.4 31.0*  PLT 386 368    Recent Labs  10/23/16 1000 10/24/16 0330  NA 134* 131*  K 3.6 4.0  CL 102 100*  CO2 22 22  BUN <5* 7  CREATININE 0.85 0.97  GLUCOSE 109* 175*  CALCIUM 9.0 8.6*    Recent Labs  10/23/16 1000  INR 0.97    Neurologically intact Dg Knee Right Port  Result Date: 10/23/2016 CLINICAL DATA:  Postop right total knee arthroplasty. EXAM: PORTABLE RIGHT KNEE - 1-2 VIEW COMPARISON:  07/23/2016 FINDINGS: Sequelae of recent total knee arthroplasty are identified. The prosthetic components appear normally located. There is no evidence of acute fracture or dislocation. There is a large joint effusion. Postoperative gas is present in the knee joint and surrounding soft tissues with diffuse soft tissue swelling noted. Skin staples are in place. IMPRESSION: Recent right total knee arthroplasty without evidence of acute complication. Electronically Signed   By: Logan Bores M.D.   On: 10/23/2016 16:18    Assessment/Plan: 1 Day Post-Op Procedure(s) (LRB): RIGHT TOTAL KNEE ARTHROPLASTY (Right) Up with therapy  [name] 10/24/2016, 7:54 AM

## 2016-10-24 NOTE — Evaluation (Signed)
Occupational Therapy Evaluation Patient Details Name: Carrie [name] MRN: [id number] DOB: January 11, 1951 Today's Date: 10/24/2016    History of Present Illness 66 y.o. female with h/o LBP, breast cancer s/p mastectomy, alcohol abuse admitted for R TKA.    Clinical Impression   Pt admitted with the above diagnoses and presents with below problem list. Pt will benefit from continued acute OT to address the below listed deficits and maximize independence with basic ADLs prior to d/c to venue below. PTA pt was independent with ADLs. Pt is currently min A with LB ADLs and functional mobility/transfers. Pt reports no assistance available at d/c. Recommending SNF for further rehab to improve functional independence prior to returning home.       Follow Up Recommendations  SNF;Supervision/Assistance - 24 hour    Equipment Recommendations  3 in 1 bedside commode    Recommendations for Other Services       Precautions / Restrictions Precautions Precautions: Fall;Knee Required Braces or Orthoses: Knee Immobilizer - Right Restrictions Weight Bearing Restrictions: No Other Position/Activity Restrictions: WBAT      Mobility Bed Mobility Overal bed mobility: Needs Assistance Bed Mobility: Supine to Sit     Supine to sit: Supervision     General bed mobility comments: up in chair  Transfers Overall transfer level: Needs assistance Equipment used: Rolling walker (2 wheeled) Transfers: Sit to/from [facility] Sit to Stand: Min assist Stand pivot transfers: Min assist       General transfer comment: verbal cues for hand placement. Assist to steady. from recliner<>BSC. After pericare in standing pt sat back down for rest break for a few minutes before returning to recliner.     Balance Overall balance assessment: Needs assistance         Standing balance support: Bilateral upper extremity supported Standing balance-Leahy Scale: Fair Standing balance comment: min  guard static stand. stood with min A during pericare.                            ADL either performed or assessed with clinical judgement   ADL Overall ADL's : Needs assistance/impaired Eating/Feeding: Set up;Sitting   Grooming: Set up;Sitting   Upper Body Bathing: Set up;Sitting   Lower Body Bathing: Minimal assistance;Sit to/from stand   Upper Body Dressing : Set up;Sitting   Lower Body Dressing: Minimal assistance;Sit to/from stand   Toilet Transfer: Minimal assistance;Stand-pivot;BSC;RW   Toileting- Clothing Manipulation and Hygiene: Minimal assistance;Sit to/from stand Toileting - Clothing Manipulation Details (indicate cue type and reason): therapist assisted to steady balance while pt completed pericare Tub/ Shower Transfer: Minimal assistance;Stand-pivot;Shower seat;3 in 1;Rolling walker   Functional mobility during ADLs: Minimal assistance;Rolling walker (pivotal steps) General ADL Comments: Pt completed SPT recliner<>BSC and pericare as detailed above. Pt very lethargic, suspect due to meds.      Vision         Perception     Praxis      Pertinent Vitals/Pain Pain Assessment: Faces Pain Score: 3  Faces Pain Scale: Hurts a little bit Pain Location: R knee Pain Descriptors / Indicators: Sore Pain Intervention(s): Monitored during session;Premedicated before session     Hand Dominance     Extremity/Trunk Assessment Upper Extremity Assessment Upper Extremity Assessment: Overall WFL for tasks assessed   Lower Extremity Assessment Lower Extremity Assessment: Defer to PT evaluation RLE Deficits / Details: SLR 3/5, knee flexion 40* AAROM, ankle WNL, sensation intact to light touch  Communication Communication Communication: No difficulties   Cognition Arousal/Alertness: Lethargic;Suspect due to medications Behavior During Therapy: [facility] for tasks assessed/performed;Flat affect Overall Cognitive Status: Difficult to assess                                      General Comments       Exercises Total Joint Exercises Ankle Circles/Pumps: AROM;Both;10 reps;Supine Quad Sets: AROM;Right;5 reps;Supine Heel Slides: AAROM;Right;10 reps;Supine Straight Leg Raises: AROM;AAROM;Right;5 reps;Supine Goniometric ROM: R knee 0-40* AAROM   Shoulder Instructions      Home Living Family/patient expects to be discharged to:: Private residence Living Arrangements: Alone     Home Access: Level entry     Home Layout: One level     Bathroom Shower/Tub: Tub/shower unit         Home Equipment: Shower seat   Additional Comments: Pt reports she does not have any available help at d/c.       Prior Functioning/Environment Level of Independence: Independent                 OT Problem List: Impaired balance (sitting and/or standing);Decreased knowledge of use of DME or AE;Decreased knowledge of precautions;Pain      OT Treatment/Interventions: Self-care/ADL training;DME and/or AE instruction;Therapeutic activities;Patient/family education;Balance training    OT Goals(Current goals can be found in the care plan section) Acute Rehab OT Goals Patient Stated Goal: to walk OT Goal Formulation: With patient Time For Goal Achievement: 10/31/16 Potential to Achieve Goals: Good ADL Goals Pt Will Perform Lower Body Bathing: with modified independence;with adaptive equipment;sit to/from stand Pt Will Perform Lower Body Dressing: with modified independence;with adaptive equipment;sit to/from stand Pt Will Transfer to Toilet: with modified independence;ambulating Pt Will Perform Toileting - Clothing Manipulation and hygiene: with modified independence;sit to/from stand Pt Will Perform Tub/Shower Transfer: Tub transfer;with supervision;ambulating;shower seat;rolling walker  OT Frequency: Min 2X/week   Barriers to D/C:            Co-evaluation              AM-PAC PT "6 Clicks" Daily Activity     Outcome  Measure Help from another person eating meals?: None Help from another person taking care of personal grooming?: None Help from another person toileting, which includes using toliet, bedpan, or urinal?: A Little Help from another person bathing (including washing, rinsing, drying)?: A Lot Help from another person to put on and taking off regular upper body clothing?: None Help from another person to put on and taking off regular lower body clothing?: A Little 6 Click Score: 20   End of Session Equipment Utilized During Treatment: Gait belt;Rolling walker;Right knee immobilizer  Activity Tolerance: Patient limited by lethargy Patient left: in chair;with call bell/phone within reach  OT Visit Diagnosis: Other abnormalities of gait and mobility (R26.89);Pain Pain - Right/Left: Right Pain - part of body: Knee                Time: 9924-2683 OT Time Calculation (min): 18 min Charges:  OT General Charges $OT Visit: 1 Procedure OT Evaluation $OT Eval Low Complexity: 1 Procedure G-Codes:       Hortencia Pilar 10/24/2016, 1:11 PM

## 2016-10-24 NOTE — Clinical Social Work Note (Signed)
Clinical Social Work Assessment  Patient Details  Name: Carrie [name] MRN: [id number] Date of Birth: 1950-09-03  Date of referral:  10/24/16               Reason for consult:  Facility Placement                Permission sought to share information with:  Chartered certified accountant granted to share information::  Yes, Verbal Permission Granted  Name::     Arboriculturist::  SNF  Relationship::  friend  Contact Information:     Housing/Transportation Living arrangements for the past 2 months:  Single Family Home Source of Information:  Friend/Neighbor, Patient Patient Interpreter Needed:  None Criminal Activity/Legal Involvement Pertinent to Current Situation/Hospitalization:  No - Comment as needed Significant Relationships:  Adult Children, Friend Lives with:  Self Do you feel safe going back to the place where you live?  No Need for family participation in patient care:  No (Coment)  Care giving concerns:  Patient resided home alone prior to hospitalization. Records document daily alcohol use. Pt is not safe to return home at this time and will need short term rehab. Pt agreeable to same.  Social Worker assessment / plan:  CSW met with patient and friend Carrie [name] at bedside to discuss clinical teams recommendation for short term rehab at DC.  CSW explained her role and SNF options/placement. CSW obtained verbal permission to send out bed offers.  CSW discussed insurance authorization process.  FL2 completed. Passr obtained. Offers sent.  Employment status:  Disabled (Comment on whether or not currently receiving Disability), Retired Nurse, adult PT Recommendations:  [place] / Referral to community resources:  [place]  Patient/Family's Response to care:  Patient reports no issues with care at this time. Pt appreciative of CSW assistance with this process.  Patient/Family's Understanding of and  Emotional Response to Diagnosis, Current Treatment, and Prognosis:  Patient has good understanding of diagnosis,current treatment and prognosis. Pt hopeful that her impairment will be improved with short term rehab. No issues or concerns reported at this time.  Emotional Assessment Appearance:  Appears stated age Attitude/Demeanor/Rapport:  Lethargic Affect (typically observed):  Accepting, Appropriate Orientation:  Oriented to Self, Oriented to Place, Oriented to  Time, Oriented to Situation Alcohol / Substance use:  Alcohol Use Psych involvement (Current and /or in the community):  No (Comment)  Discharge Needs  Concerns to be addressed:  Care Coordination Readmission within the last 30 days:  No Current discharge risk:  Dependent with Mobility, Physical Impairment, Lives alone, Abuse Barriers to Discharge:  No Barriers Identified   Normajean Baxter, LCSW 10/24/2016, 2:41 PM

## 2016-10-24 NOTE — Progress Notes (Signed)
Physical Therapy Treatment Patient Details Name: Carrie [name] MRN: [id number] DOB: [birth year] Today's Date: 10/24/2016    History of Present Illness 66 y.o. female with h/o LBP, breast cancer s/p mastectomy, alcohol abuse admitted for R TKA.     PT Comments    Pt lethargic, difficult to arouse, frequently falls asleep during PT session.  She hasn't had pain meds since 6:23am. Pt ambulated 76' with RW, then became dizzy so was assisted to recliner. Some UE/LE tremors noted, RN aware. R TKA exercises performed. Pt's stated goal is to DC home so she can drink beer.   Follow Up Recommendations  Supervision for mobility/OOB;SNF;Home health PT (SNF vs HHPT -pt stated she has no help available at home)     Equipment Recommendations  Rolling walker with 5" wheels    Recommendations for Other Services       Precautions / Restrictions Precautions Precautions: Fall;Knee Required Braces or Orthoses: Knee Immobilizer - Right Restrictions Weight Bearing Restrictions: No Other Position/Activity Restrictions: WBAT    Mobility  Bed Mobility Overal bed mobility: Needs Assistance Bed Mobility: Supine to Sit     Supine to sit: Min assist     General bed mobility comments: for RLE  Transfers Overall transfer level: Needs assistance Equipment used: Rolling walker (2 wheeled) Transfers: Sit to/from Stand Sit to Stand: Min assist Stand pivot transfers: Min assist       General transfer comment: verbal cues for hand placement  Ambulation/Gait Ambulation/Gait assistance: Min guard Ambulation Distance (Feet): 28 Feet Assistive device: Rolling walker (2 wheeled) Gait Pattern/deviations: Step-to pattern;Wide base of support     General Gait Details: distance limited by onset of dizziness, VCs for sequencing   Stairs            Wheelchair Mobility    Modified Rankin (Stroke Patients Only)       Balance Overall balance assessment: Needs assistance   Sitting  balance-Leahy Scale: Good     Standing balance support: Bilateral upper extremity supported Standing balance-Leahy Scale: Fair Standing balance comment: min guard static stand. stood with min A during pericare.                             Cognition Arousal/Alertness: Lethargic Behavior During Therapy: WFL for tasks assessed/performed;Flat affect Overall Cognitive Status: Within Functional Limits for tasks assessed                                 General Comments: difficult to arouse, frequently closes eyes;falls asleep during PT session; oriented x 4      Exercises Total Joint Exercises Ankle Circles/Pumps: AROM;Both;10 reps;Supine Short Arc Quad: AAROM;AROM;Right;10 reps;Supine Heel Slides: AAROM;Right;10 reps;Supine Hip ABduction/ADduction: AROM;AAROM;Right;10 reps;Supine Straight Leg Raises: AROM;AAROM;Right;Supine;10 reps Goniometric ROM: 0-40* AAROM R knee    General Comments        Pertinent Vitals/Pain Pain Assessment: Faces Pain Score: 2  Faces Pain Scale: Hurts a little bit Pain Location: R knee Pain Descriptors / Indicators: Sore Pain Intervention(s): Limited activity within patient's tolerance;Monitored during session;Ice applied (no pain meds given 2* lethargy)    Home Living Family/patient expects to be discharged to:: Private residence Living Arrangements: Alone     Home Access: Level entry   Home Layout: One level Home Equipment: Shower seat Additional Comments: Pt reports she does not have any available help at d/c.     Prior Function Level  of Independence: Independent          PT Goals (current goals can now be found in the care plan section) Acute Rehab PT Goals Patient Stated Goal: to drink beer PT Goal Formulation: With patient Time For Goal Achievement: 11/07/16 Potential to Achieve Goals: Good Progress towards PT goals: Progressing toward goals    Frequency    7X/week      PT Plan       Co-evaluation              AM-PAC PT "6 Clicks" Daily Activity  Outcome Measure  Difficulty turning over in bed (including adjusting bedclothes, sheets and blankets)?: A Little Difficulty moving from lying on back to sitting on the side of the bed? : A Little Difficulty sitting down on and standing up from a chair with arms (e.g., wheelchair, bedside commode, etc,.)?: A Little Help needed moving to and from a bed to chair (including a wheelchair)?: A Little Help needed walking in hospital room?: A Little Help needed climbing 3-5 steps with a railing? : A Lot 6 Click Score: 17    End of Session Equipment Utilized During Treatment: Gait belt Activity Tolerance: Patient tolerated treatment well Patient left: in chair;with call bell/phone within reach;with chair alarm set Nurse Communication: Mobility status PT Visit Diagnosis: Difficulty in walking, not elsewhere classified (R26.2);Pain Pain - Right/Left: Right Pain - part of body: Knee     Time: 2426-8341 PT Time Calculation (min) (ACUTE ONLY): 19 min  Charges:  $Gait Training: 8-22 mins                    G Codes:          Philomena Doheny 10/24/2016, 2:11 PM [phone]

## 2016-10-24 NOTE — Social Work (Signed)
CSW discussed bed offers with patient  and friend Valetta Fuller [phone]. Patient has selected [facility] for short term rehab.  CSW faxed off clinicals for review. CSW will f/u as [employer] fast streamer pending.  Elissa Hefty, LCSW Clinical Social Worker [phone]

## 2016-10-25 LAB — CBC
HEMATOCRIT: 25.2 % — AB (ref 36.0–46.0)
HEMOGLOBIN: 8.3 g/dL — AB (ref 12.0–15.0)
MCH: 28.3 pg (ref 26.0–34.0)
MCHC: 32.9 g/dL (ref 30.0–36.0)
MCV: 86 fL (ref 78.0–100.0)
Platelets: 285 10*3/uL (ref 150–400)
RBC: 2.93 MIL/uL — ABNORMAL LOW (ref 3.87–5.11)
RDW: 16.4 % — ABNORMAL HIGH (ref 11.5–15.5)
WBC: 9.1 10*3/uL (ref 4.0–10.5)

## 2016-10-25 MED ORDER — OXYCODONE-ACETAMINOPHEN 5-325 MG PO TABS: 1.0000 | ORAL_TABLET | Freq: Four times a day (QID) | ORAL | 0 refills | Status: DC | PRN

## 2016-10-25 MED ORDER — METHOCARBAMOL 500 MG PO TABS: 500.0000 mg | ORAL_TABLET | Freq: Four times a day (QID) | ORAL | 0 refills | Status: DC | PRN

## 2016-10-25 MED ORDER — ASPIRIN 325 MG PO TBEC: 325.0000 mg | DELAYED_RELEASE_TABLET | Freq: Every day | ORAL | 0 refills | Status: DC

## 2016-10-25 NOTE — Care Management Note (Signed)
Case Management Note  Patient Details  Name: Carrie [name] MRN: [id number] Date of Birth: 01-May-1950  Subjective/Objective:        Right TKA            Action/Plan: Chart reviewed. CSW following for SNF placement. Scheduled dc today to SNF.    PCP  Sherene Sires MD   Expected Discharge Date:                  Expected Discharge Plan:  [place]  In-House Referral:  Clinical Social Work  Discharge planning Services  CM Consult  Post Acute Care Choice:  NA Choice offered to:  NA  DME Arranged:  N/A DME Agency:  NA  HH Arranged:  NA HH Agency:  NA  Status of Service:  Completed, signed off  If discussed at [place] of Stay Meetings, dates discussed:    Additional Comments:  Erenest Rasher, RN 10/25/2016, 11:41 AM

## 2016-10-25 NOTE — Discharge Summary (Signed)
Patient ID: Carrie [name] MRN: [id number] DOB/AGE: Jan 11, 1951 66 y.o.  Admit date: 10/23/2016 Discharge date: 10/25/2016  Admission Diagnoses:  Active Problems:   Arthritis of right knee   Discharge Diagnoses:  Active Problems:   Arthritis of right knee  status post Procedure(s): RIGHT TOTAL KNEE ARTHROPLASTY  Past Medical History:  Diagnosis Date  . Arthritis    "back, right hand" (07/11/2014)  . Breast cancer, right breast ([place]) 05/2009  . Chronic lower back pain   . GERD (gastroesophageal reflux disease)   . Hepatic steatosis 07/17/2011  . Hypertension   . Hypokalemia 07/17/2011  . Lower GI bleeding 07/11/2014 hospitalized    Surgeries: Procedure(s): RIGHT TOTAL KNEE ARTHROPLASTY on 10/23/2016   Consultants:   Discharged Condition: Improved  Hospital Course: Carrie [name] is an 66 y.o. female who was admitted 10/23/2016 for operative treatment of knee arthritis. Patient failed conservative treatments (please see the history and physical for the specifics) and had severe unremitting pain that affects sleep, daily activities and work/hobbies. After pre-op clearance, the patient was taken to the operating room on 10/23/2016 and underwent  Procedure(s): RIGHT TOTAL KNEE ARTHROPLASTY.    Patient was given perioperative antibiotics: Anti-infectives    Start     Dose/Rate Route Frequency Ordered Stop   10/23/16 2200  ceFAZolin (ANCEF) IVPB 1 g/50 mL premix     1 g 100 mL/hr over 30 Minutes Intravenous Every 8 hours 10/23/16 1917 10/24/16 0653   10/23/16 1008  ceFAZolin (ANCEF) IVPB 2g/100 mL premix     2 g 200 mL/hr over 30 Minutes Intravenous On call to O.R. 10/23/16 1008 10/23/16 1325       Patient was given sequential compression devices and early ambulation to prevent DVT.   Patient benefited maximally from hospital stay and there were no complications. At the time of discharge, the patient was urinating/moving their bowels without difficulty, tolerating a regular diet,  pain is controlled with oral pain medications and they have been cleared by PT/OT.   Recent vital signs: Patient Vitals for the past 24 hrs:  BP Temp Temp src Pulse Resp SpO2  10/25/16 0529 128/77 98.4 F (36.9 C) Oral 80 18 98 %  10/24/16 2026 (!) 142/85 98.5 F (36.9 C) Oral 91 18 100 %  10/24/16 1500 (!) 141/82 97.6 F (36.4 C) Oral 81 18 99 %  10/24/16 1230 138/78 - - 90 - -     Recent laboratory studies:  Recent Labs  10/23/16 1000 10/24/16 0330 10/25/16 0407  WBC 6.0 13.5* 9.1  HGB 12.1 10.1* 8.3*  HCT 36.4 31.0* 25.2*  PLT 386 368 285  NA 134* 131*  --   K 3.6 4.0  --   CL 102 100*  --   CO2 22 22  --   BUN <5* 7  --   CREATININE 0.85 0.97  --   GLUCOSE 109* 175*  --   INR 0.97  --   --   CALCIUM 9.0 8.6*  --      Discharge Medications:   Allergies as of 10/25/2016      Reactions   Other Other (See Comments)   Seasonal hay fever      Medication List    STOP taking these medications   ibuprofen 200 MG tablet Commonly known as:  ADVIL,MOTRIN   traMADol 50 MG tablet Commonly known as:  ULTRAM     TAKE these medications   amLODipine 10 MG tablet Commonly known as:  NORVASC  take 1 tablet by mouth once daily   aspirin 325 MG EC tablet Take 1 tablet (325 mg total) by mouth daily with breakfast.   calcium carbonate 1500 (600 Ca) MG Tabs tablet Commonly known as:  OSCAL Take 600 mg by mouth daily.   fluticasone 50 MCG/ACT nasal spray Commonly known as:  FLONASE Place 2 sprays into both nostrils daily. What changed:  when to take this  reasons to take this   loratadine 10 MG tablet Commonly known as:  CLARITIN Take 1 tablet (10 mg total) by mouth daily.   methocarbamol 500 MG tablet Commonly known as:  ROBAXIN Take 1 tablet (500 mg total) by mouth every 6 (six) hours as needed for muscle spasms.   nystatin cream Commonly known as:  MYCOSTATIN Apply 1 application topically 2 (two) times daily.   oxyCODONE-acetaminophen 5-325 MG  tablet Commonly known as:  PERCOCET/ROXICET Take 1 tablet by mouth every 6 (six) hours as needed for severe pain.   pantoprazole 20 MG tablet Commonly known as:  PROTONIX Take 1 tablet (20 mg total) by mouth daily.       Diagnostic Studies: Dg Chest 2 View  Result Date: 10/07/2016 CLINICAL DATA:  Pre op for a right knee replacement No recent chest complaints Hx of HTN- on meds, ex-smoker EXAM: CHEST  2 VIEW COMPARISON:  01/31/2015 FINDINGS: Heart size is normal. Lungs are free of focal consolidations and pleural effusions. No pulmonary edema. Degenerative changes are seen in thoracic spine. IMPRESSION: No evidence for acute cardiopulmonary abnormality. Electronically Signed   By: Nolon Nations M.D.   On: 10/07/2016 09:12   Dg Knee Right Port  Result Date: 10/23/2016 CLINICAL DATA:  Postop right total knee arthroplasty. EXAM: PORTABLE RIGHT KNEE - 1-2 VIEW COMPARISON:  07/23/2016 FINDINGS: Sequelae of recent total knee arthroplasty are identified. The prosthetic components appear normally located. There is no evidence of acute fracture or dislocation. There is a large joint effusion. Postoperative gas is present in the knee joint and surrounding soft tissues with diffuse soft tissue swelling noted. Skin staples are in place. IMPRESSION: Recent right total knee arthroplasty without evidence of acute complication. Electronically Signed   By: Logan Bores M.D.   On: 10/23/2016 16:18       Contact information for follow-up providers    [name], MD. Schedule an appointment as soon as possible for a visit.   Specialty:  Orthopedic Surgery Why:  need return office visit 2 weeks postop Contact information: [place] [address] [phone]            Contact information for after-discharge care    Destination    [facility] Follow up.   Specialty:  [place] information: [place] [phone]                  Discharge Plan:  discharge to snf  Disposition:     Signed: Benjiman Core PA-C for Dr Rodell Perna [facility] orthopedics [phone] 10/25/2016, 10:25 AM

## 2016-10-25 NOTE — Progress Notes (Signed)
Patient discharged to [facility] discharge packet faxed by MSW. Patient transported by [facility] services paperwork sent with [facility] staff. Family at bedside and aware of transfer.  Geovonni Meyerhoff, Tivis Ringer, RN

## 2016-10-25 NOTE — Social Work (Addendum)
CSW called [facility] and was advised that they do not have clinicals for patient. CSW has fax confirmation for 7/26 despite same. CSW refaxed clinicals again today.  CSW met with patient at bedside and advised her that she may have to go some where else for short term rehab as we do not have auth for [facility]. She agreed to same.  CSW called heather at [facility] and she confirmed that she cannot take an LOG.  CSW was declined by: [facility], [facility], [facility], [facility], [facility].  Theressa from [facility] will f/u. And Gerald Stabs from [facility].  CSW discussed case with clinical supervisor and was approved for 5 day LOG.  CSW following up.  Elissa Hefty, LCSW Clinical Social Worker [phone]

## 2016-10-25 NOTE — Progress Notes (Signed)
Discharge to: [facility] Anticipated discharge date: 10/25/16 Transportation by: [facility]  Report #: [phone], Room [address] ([address])  CSW signing off.  Laveda Abbe LCSW [phone]

## 2016-10-25 NOTE — Progress Notes (Signed)
Physical Therapy Treatment Patient Details Name: Carrie [name] MRN: [id number] DOB: 26-Nov-1950 Today's Date: 10/25/2016    History of Present Illness 66 y.o. female with h/o LBP, breast cancer s/p mastectomy, alcohol abuse admitted for R TKA.     PT Comments    Patient tolerated therex well this session and a little more alert than am session. Pt needs cues to follow through with tasks. Pt encouraged to work on supine therex 2 more times today. Current plan remains appropriate.    Follow Up Recommendations  SNF;Supervision for mobility/OOB     Equipment Recommendations  Other (comment) (TBD next venue)    Recommendations for Other Services       Precautions / Restrictions Precautions Precautions: Fall;Knee Precaution Comments: reviewed precautions/positioning with pt  Restrictions Weight Bearing Restrictions: Yes RLE Weight Bearing: Weight bearing as tolerated    Mobility  Bed Mobility Overal bed mobility: Modified Independent Bed Mobility: Supine to Sit           General bed mobility comments: increased time and effort  Transfers Overall transfer level: Needs assistance Equipment used: Rolling walker (2 wheeled) Transfers: Sit to/from Stand Sit to Stand: Min assist         General transfer comment: assist to power up into standing and to steady upon stand  Ambulation/Gait Ambulation/Gait assistance: Min assist;Mod assist;+2 safety/equipment (chair follow) Ambulation Distance (Feet): 62 Feet Assistive device: Rolling walker (2 wheeled) Gait Pattern/deviations: Step-through pattern;Decreased stance time - right;Decreased step length - left;Decreased step length - right;Decreased weight shift to right;Antalgic Gait velocity: decreased   General Gait Details: pt with LOB X3 and unsteady with turning; cues for sequencing, proximity of RW, and R heel strike   Stairs            Wheelchair Mobility    Modified Rankin (Stroke Patients Only)        Balance Overall balance assessment: Needs assistance Sitting-balance support: No upper extremity supported;Feet supported Sitting balance-Leahy Scale: Good     Standing balance support: Bilateral upper extremity supported Standing balance-Leahy Scale: Fair                              Cognition Arousal/Alertness: Awake/alert Behavior During Therapy: WFL for tasks assessed/performed;Flat affect Overall Cognitive Status: Within Functional Limits for tasks assessed                                 General Comments: pt is not very talkative; falls asleep frequently during session      Exercises Total Joint Exercises Ankle Circles/Pumps: AROM;Both;15 reps Quad Sets: AROM;Right;10 reps Short Arc Quad: AROM;Right;10 reps Heel Slides: AROM;Right;10 reps Hip ABduction/ADduction: AROM;Right;10 reps Straight Leg Raises: AROM;Right;10 reps Long Arc Quad: AROM;Right;10 reps Goniometric ROM: 0-90    General Comments        Pertinent Vitals/Pain Pain Assessment: Faces Faces Pain Scale: Hurts little more Pain Location: R knee Pain Descriptors / Indicators: Sore;Grimacing;Guarding Pain Intervention(s): Monitored during session;Repositioned    Home Living                      Prior Function            PT Goals (current goals can now be found in the care plan section) Progress towards PT goals: Progressing toward goals    Frequency    7X/week      PT  Plan Current plan remains appropriate    Co-evaluation              AM-PAC PT "6 Clicks" Daily Activity  Outcome Measure  Difficulty turning over in bed (including adjusting bedclothes, sheets and blankets)?: A Little Difficulty moving from lying on back to sitting on the side of the bed? : A Little Difficulty sitting down on and standing up from a chair with arms (e.g., wheelchair, bedside commode, etc,.)?: A Little Help needed moving to and from a bed to chair (including a  wheelchair)?: A Little Help needed walking in hospital room?: A Little Help needed climbing 3-5 steps with a railing? : A Lot 6 Click Score: 17    End of Session Equipment Utilized During Treatment: Gait belt Activity Tolerance: Patient tolerated treatment well Patient left: with call bell/phone within reach;in bed;with bed alarm set Nurse Communication: Mobility status PT Visit Diagnosis: Difficulty in walking, not elsewhere classified (R26.2);Pain Pain - Right/Left: Right Pain - part of body: Knee     Time: 2248-2500 PT Time Calculation (min) (ACUTE ONLY): 26 min  Charges:   $Therapeutic Exercise: 23-37 mins                    G Codes:       Earney Navy, PTA Pager: [phone]     Darliss Cheney 10/25/2016, 3:11 PM

## 2016-10-25 NOTE — Progress Notes (Signed)
Physical Therapy Treatment Patient Details Name: Carrie [name] MRN: [id number] DOB: [birth year] Today's Date: 10/25/2016    History of Present Illness 66 y.o. female with h/o LBP, breast cancer s/p mastectomy, alcohol abuse admitted for R TKA.     PT Comments    Patient is making gradual progress toward mobility goals. Pt with improved ROM. Pt unsteady with gait and continues to fall asleep easily during session. Continue to progress as tolerated with anticipated d/c to SNF for further skilled PT services.     Follow Up Recommendations  SNF;Supervision for mobility/OOB     Equipment Recommendations  Other (comment) (TBD next venue)    Recommendations for Other Services       Precautions / Restrictions Precautions Precautions: Fall;Knee Precaution Comments: reviewed precautions/positioning with pt  Restrictions Weight Bearing Restrictions: No RLE Weight Bearing: Weight bearing as tolerated    Mobility  Bed Mobility Overal bed mobility: Modified Independent Bed Mobility: Supine to Sit           General bed mobility comments: increased time and effort  Transfers Overall transfer level: Needs assistance Equipment used: Rolling walker (2 wheeled) Transfers: Sit to/from Stand Sit to Stand: Min assist         General transfer comment: assist to power up into standing and to steady upon stand  Ambulation/Gait Ambulation/Gait assistance: Min assist;Mod assist;+2 safety/equipment (chair follow) Ambulation Distance (Feet): 62 Feet Assistive device: Rolling walker (2 wheeled) Gait Pattern/deviations: Step-through pattern;Decreased stance time - right;Decreased step length - left;Decreased step length - right;Decreased weight shift to right;Antalgic Gait velocity: decreased   General Gait Details: pt with LOB X3 and unsteady with turning; cues for sequencing, proximity of RW, and R heel strike   Stairs            Wheelchair Mobility    Modified Rankin (Stroke  Patients Only)       Balance Overall balance assessment: Needs assistance Sitting-balance support: No upper extremity supported;Feet supported Sitting balance-Leahy Scale: Good     Standing balance support: Bilateral upper extremity supported Standing balance-Leahy Scale: Fair                              Cognition Arousal/Alertness: Lethargic Behavior During Therapy: WFL for tasks assessed/performed;Flat affect Overall Cognitive Status: Within Functional Limits for tasks assessed                                 General Comments: pt is not very talkative; falls asleep frequently during session      Exercises Total Joint Exercises Long Arc Quad: AROM;Right;10 reps Goniometric ROM: 0-90    General Comments        Pertinent Vitals/Pain Pain Assessment: Faces Faces Pain Scale: Hurts a little bit Pain Location: R knee Pain Descriptors / Indicators: Sore Pain Intervention(s): Monitored during session;Repositioned    Home Living                      Prior Function            PT Goals (current goals can now be found in the care plan section) Progress towards PT goals: Progressing toward goals    Frequency    7X/week      PT Plan Current plan remains appropriate    Co-evaluation  AM-PAC PT "6 Clicks" Daily Activity  Outcome Measure  Difficulty turning over in bed (including adjusting bedclothes, sheets and blankets)?: A Little Difficulty moving from lying on back to sitting on the side of the bed? : A Little Difficulty sitting down on and standing up from a chair with arms (e.g., wheelchair, bedside commode, etc,.)?: A Little Help needed moving to and from a bed to chair (including a wheelchair)?: A Little Help needed walking in hospital room?: A Little Help needed climbing 3-5 steps with a railing? : A Lot 6 Click Score: 17    End of Session Equipment Utilized During Treatment: Gait belt Activity  Tolerance: Patient tolerated treatment well;Patient limited by lethargy (falling asleep during therex) Patient left: in chair;with call bell/phone within reach;with chair alarm set Nurse Communication: Mobility status PT Visit Diagnosis: Difficulty in walking, not elsewhere classified (R26.2);Pain Pain - Right/Left: Right Pain - part of body: Knee     Time: 1032-1050 PT Time Calculation (min) (ACUTE ONLY): 18 min  Charges:  $Gait Training: 8-22 mins                    G Codes:       Earney Navy, PTA Pager: [phone]     Darliss Cheney 10/25/2016, 1:22 PM

## 2016-10-26 DIAGNOSIS — Z96651 Presence of right artificial knee joint: Secondary | ICD-10-CM | POA: Diagnosis not present

## 2016-10-26 DIAGNOSIS — R278 Other lack of coordination: Secondary | ICD-10-CM | POA: Diagnosis not present

## 2016-10-26 DIAGNOSIS — F1010 Alcohol abuse, uncomplicated: Secondary | ICD-10-CM | POA: Diagnosis not present

## 2016-10-26 DIAGNOSIS — Z7189 Other specified counseling: Secondary | ICD-10-CM | POA: Diagnosis not present

## 2016-10-26 DIAGNOSIS — M2689 Other dentofacial anomalies: Secondary | ICD-10-CM | POA: Diagnosis not present

## 2016-10-26 DIAGNOSIS — M1711 Unilateral primary osteoarthritis, right knee: Secondary | ICD-10-CM | POA: Diagnosis not present

## 2016-10-26 DIAGNOSIS — M6281 Muscle weakness (generalized): Secondary | ICD-10-CM | POA: Diagnosis not present

## 2016-10-26 DIAGNOSIS — R2689 Other abnormalities of gait and mobility: Secondary | ICD-10-CM | POA: Diagnosis not present

## 2016-10-26 DIAGNOSIS — I10 Essential (primary) hypertension: Secondary | ICD-10-CM | POA: Diagnosis not present

## 2016-10-26 DIAGNOSIS — R5381 Other malaise: Secondary | ICD-10-CM | POA: Diagnosis not present

## 2016-10-26 DIAGNOSIS — T8484XD Pain due to internal orthopedic prosthetic devices, implants and grafts, subsequent encounter: Secondary | ICD-10-CM | POA: Diagnosis not present

## 2016-10-30 ENCOUNTER — Inpatient Hospital Stay (INDEPENDENT_AMBULATORY_CARE_PROVIDER_SITE_OTHER): Payer: Commercial Managed Care - HMO | Admitting: Orthopaedic Surgery

## 2016-10-30 DIAGNOSIS — I10 Essential (primary) hypertension: Secondary | ICD-10-CM | POA: Diagnosis not present

## 2016-10-30 DIAGNOSIS — Z7189 Other specified counseling: Secondary | ICD-10-CM | POA: Diagnosis not present

## 2016-10-30 DIAGNOSIS — F1010 Alcohol abuse, uncomplicated: Secondary | ICD-10-CM | POA: Diagnosis not present

## 2016-10-30 DIAGNOSIS — M1711 Unilateral primary osteoarthritis, right knee: Secondary | ICD-10-CM | POA: Diagnosis not present

## 2016-11-01 DIAGNOSIS — F1010 Alcohol abuse, uncomplicated: Secondary | ICD-10-CM | POA: Diagnosis not present

## 2016-11-01 DIAGNOSIS — Z7189 Other specified counseling: Secondary | ICD-10-CM | POA: Diagnosis not present

## 2016-11-01 DIAGNOSIS — M1711 Unilateral primary osteoarthritis, right knee: Secondary | ICD-10-CM | POA: Diagnosis not present

## 2016-11-01 DIAGNOSIS — I10 Essential (primary) hypertension: Secondary | ICD-10-CM | POA: Diagnosis not present

## 2016-11-04 DIAGNOSIS — R5381 Other malaise: Secondary | ICD-10-CM | POA: Diagnosis not present

## 2016-11-04 DIAGNOSIS — I10 Essential (primary) hypertension: Secondary | ICD-10-CM | POA: Diagnosis not present

## 2016-11-04 DIAGNOSIS — M1711 Unilateral primary osteoarthritis, right knee: Secondary | ICD-10-CM | POA: Diagnosis not present

## 2016-11-05 ENCOUNTER — Encounter (INDEPENDENT_AMBULATORY_CARE_PROVIDER_SITE_OTHER): Payer: Self-pay | Admitting: Orthopaedic Surgery

## 2016-11-05 ENCOUNTER — Ambulatory Visit (INDEPENDENT_AMBULATORY_CARE_PROVIDER_SITE_OTHER): Payer: Medicare HMO

## 2016-11-05 ENCOUNTER — Ambulatory Visit (INDEPENDENT_AMBULATORY_CARE_PROVIDER_SITE_OTHER): Payer: Medicare HMO | Admitting: Orthopaedic Surgery

## 2016-11-05 VITALS — BP 126/81 | Ht 61.0 in | Wt 186.0 lb

## 2016-11-05 DIAGNOSIS — Z96651 Presence of right artificial knee joint: Secondary | ICD-10-CM

## 2016-11-05 DIAGNOSIS — M25561 Pain in right knee: Secondary | ICD-10-CM

## 2016-11-05 DIAGNOSIS — G8929 Other chronic pain: Secondary | ICD-10-CM

## 2016-11-05 NOTE — Progress Notes (Signed)
Post-Op Visit Note   Patient: Carrie [name]           Date of Birth: [birth year]           MRN: [id number] Visit Date: 11/05/2016 PCP: Sherene Sires, DO   Assessment & Plan:  Chief Complaint:  Chief Complaint  Patient presents with  . Right Knee - Routine Post Op    10/23/16 right total knee replacement    Visit Diagnoses:  1. Chronic pain of right knee   2. Status post total right knee replacement     Plan: Patient follow up in 4 weeks for recheck. Continue rehabilitation. Anticipate discharge home from the rehabilitation center within the next couple of days. Needs home health PT.    Follow-Up Instructions: Return in about 4 weeks (around 12/03/2016).   Orders:  Orders Placed This Encounter  Procedures  . XR Knee 1-2 Views Right   No orders of the defined types were placed in this encounter.   Imaging: No results found.  PMFS History: Patient Active Problem List   Diagnosis Date Noted  . Arthritis of right knee 10/23/2016  . Pain in right foot 09/01/2016  . Impingement syndrome of left shoulder 09/01/2016  . Unilateral primary osteoarthritis, right knee 09/01/2016  . Spinal stenosis of lumbar region with neurogenic claudication 02/14/2016  . Eustachian tube dysfunction 09/04/2015  . Back pain of thoracolumbar region 05/08/2015  . Epidermoid cyst of skin 01/10/2015  . Muscle cramping 12/05/2014  . Low back pain 06/14/2014  . Preventative health care 03/15/2014  . Diabetes mellitus screening 02/22/2014  . Bilateral knee pain 02/08/2014  . Transaminitis 07/23/2013  . Essential hypertension, benign 07/23/2013  . GERD (gastroesophageal reflux disease) 07/23/2013  . Vitamin D deficiency 07/22/2013  . Breast cancer, left breast ([place]) 07/17/2011  . FIBROIDS, UTERUS 01/30/2007  . Alcohol abuse 01/30/2007   Past Medical History:  Diagnosis Date  . Arthritis    "back, right hand" (07/11/2014)  . Breast cancer, right breast ([place]) 05/2009  . Chronic lower back pain     . GERD (gastroesophageal reflux disease)   . Hepatic steatosis 07/17/2011  . Hypertension   . Hypokalemia 07/17/2011  . Lower GI bleeding 07/11/2014 hospitalized    Family History  Problem Relation Age of Onset  . Cancer Other   . Diabetes Mother   . Hypertension Mother   . Diabetes Brother   . Hypertension Father   . Arthritis/Rheumatoid Father   . Cancer Father   . Cancer Maternal Grandfather   . Heart disease Brother     Past Surgical History:  Procedure Laterality Date  . BREAST BIOPSY Right 2011  . BREAST LUMPECTOMY Right 2011  . COLONOSCOPY    . ESOPHAGOGASTRODUODENOSCOPY N/A 07/12/2014   Procedure: ESOPHAGOGASTRODUODENOSCOPY (EGD);  Surgeon: Clarene Essex, MD;  Location: [facility] ENDOSCOPY;  Service: Endoscopy;  Laterality: N/A;  . FINGER SURGERY     middle finger on left hand  . MASTECTOMY COMPLETE / SIMPLE W/ SENTINEL NODE BIOPSY  05/2009   Archie Endo 05/23/2009  . TOTAL KNEE ARTHROPLASTY Right 10/23/2016  . TOTAL KNEE ARTHROPLASTY Right 10/23/2016   Procedure: RIGHT TOTAL KNEE ARTHROPLASTY;  Surgeon: [name], MD;  Location: [place];  Service: Orthopedics;  Laterality: Right;   Social History   Occupational History  . Not on file.   Social History Main Topics  . Smoking status: Never Smoker  . Smokeless tobacco: Never Used  . Alcohol use 50.4 oz/week    42 Cans of  beer, 42 Shots of liquor per week     Comment: daily since her 20s several a day   . Drug use: Yes    Frequency: 7.0 times per week    Types: Marijuana  . Sexual activity: Not Currently    Birth control/ protection: Post-menopausal   Exam Today Staples removed and Steri-Strips applied.. Incision well without signs of infection. Knee range of motion about 0 110. Calf nontender

## 2016-11-12 ENCOUNTER — Telehealth (INDEPENDENT_AMBULATORY_CARE_PROVIDER_SITE_OTHER): Payer: Self-pay | Admitting: Orthopaedic Surgery

## 2016-11-12 DIAGNOSIS — M48062 Spinal stenosis, lumbar region with neurogenic claudication: Secondary | ICD-10-CM | POA: Diagnosis not present

## 2016-11-12 DIAGNOSIS — I10 Essential (primary) hypertension: Secondary | ICD-10-CM | POA: Diagnosis not present

## 2016-11-12 DIAGNOSIS — Z79891 Long term (current) use of opiate analgesic: Secondary | ICD-10-CM | POA: Diagnosis not present

## 2016-11-12 DIAGNOSIS — Z96651 Presence of right artificial knee joint: Secondary | ICD-10-CM | POA: Diagnosis not present

## 2016-11-12 DIAGNOSIS — M545 Low back pain: Secondary | ICD-10-CM | POA: Diagnosis not present

## 2016-11-12 DIAGNOSIS — Z9181 History of falling: Secondary | ICD-10-CM | POA: Diagnosis not present

## 2016-11-12 DIAGNOSIS — M1990 Unspecified osteoarthritis, unspecified site: Secondary | ICD-10-CM | POA: Diagnosis not present

## 2016-11-12 DIAGNOSIS — Z471 Aftercare following joint replacement surgery: Secondary | ICD-10-CM | POA: Diagnosis not present

## 2016-11-12 NOTE — Telephone Encounter (Signed)
Ok thanks 

## 2016-11-12 NOTE — Telephone Encounter (Signed)
Monique from [facility] called asking for a verbal okay on 2 times a week for 4 weeks, strengthening, gate balance and home safety. Also wanted a RX for a rollator. CB # [phone]

## 2016-11-12 NOTE — Telephone Encounter (Signed)
Please advise 

## 2016-11-13 ENCOUNTER — Telehealth (INDEPENDENT_AMBULATORY_CARE_PROVIDER_SITE_OTHER): Payer: Self-pay

## 2016-11-13 NOTE — Telephone Encounter (Signed)
Monique with [facility] called wanting to know if Rx for Rollator had been faxed to [facility] at [phone].  Advised her of message concerning okay for verbal orders per Dr. Lorin Mercy.CB# is [phone].  Please advise.

## 2016-11-14 NOTE — Telephone Encounter (Signed)
Faxed per request.

## 2016-11-14 NOTE — Telephone Encounter (Signed)
Faxed to [facility] per request

## 2016-11-15 ENCOUNTER — Telehealth (INDEPENDENT_AMBULATORY_CARE_PROVIDER_SITE_OTHER): Payer: Self-pay | Admitting: Orthopaedic Surgery

## 2016-11-15 ENCOUNTER — Telehealth (INDEPENDENT_AMBULATORY_CARE_PROVIDER_SITE_OTHER): Payer: Self-pay

## 2016-11-15 DIAGNOSIS — M545 Low back pain: Secondary | ICD-10-CM | POA: Diagnosis not present

## 2016-11-15 DIAGNOSIS — Z471 Aftercare following joint replacement surgery: Secondary | ICD-10-CM | POA: Diagnosis not present

## 2016-11-15 DIAGNOSIS — I10 Essential (primary) hypertension: Secondary | ICD-10-CM | POA: Diagnosis not present

## 2016-11-15 DIAGNOSIS — Z9181 History of falling: Secondary | ICD-10-CM | POA: Diagnosis not present

## 2016-11-15 DIAGNOSIS — M48062 Spinal stenosis, lumbar region with neurogenic claudication: Secondary | ICD-10-CM | POA: Diagnosis not present

## 2016-11-15 DIAGNOSIS — M1990 Unspecified osteoarthritis, unspecified site: Secondary | ICD-10-CM | POA: Diagnosis not present

## 2016-11-15 DIAGNOSIS — Z79891 Long term (current) use of opiate analgesic: Secondary | ICD-10-CM | POA: Diagnosis not present

## 2016-11-15 DIAGNOSIS — Z96651 Presence of right artificial knee joint: Secondary | ICD-10-CM | POA: Diagnosis not present

## 2016-11-15 MED ORDER — METHOCARBAMOL 500 MG PO TABS: 500.0000 mg | ORAL_TABLET | Freq: Four times a day (QID) | ORAL | 0 refills | Status: DC | PRN

## 2016-11-15 NOTE — Telephone Encounter (Signed)
Patient returned call asked for a call back   The number to contact patient is [phone]

## 2016-11-15 NOTE — Telephone Encounter (Signed)
Michelle (OT) with   [facility] called advised completed (OT) eval this morning. Patient does not need (OT) at this time. The number to contact Sharyn Lull is [phone]

## 2016-11-15 NOTE — Telephone Encounter (Signed)
Pt is requesting for a refill on her muscle relaxer.  She uses [place].

## 2016-11-15 NOTE — Telephone Encounter (Signed)
I left voicemail returning patient's call. Asked for return call.

## 2016-11-15 NOTE — Telephone Encounter (Signed)
Script sent to pharmacy. I called patient and advised.

## 2016-11-15 NOTE — Telephone Encounter (Signed)
fyi

## 2016-11-15 NOTE — Telephone Encounter (Addendum)
Patient would like a call from you concerning a Rx.  Cb# is [phone].  Please advise. Thank You.

## 2016-11-15 NOTE — Telephone Encounter (Signed)
Duplicate message. 

## 2016-11-15 NOTE — Telephone Encounter (Signed)
Ok thanks 

## 2016-11-19 DIAGNOSIS — M48062 Spinal stenosis, lumbar region with neurogenic claudication: Secondary | ICD-10-CM | POA: Diagnosis not present

## 2016-11-19 DIAGNOSIS — Z79891 Long term (current) use of opiate analgesic: Secondary | ICD-10-CM | POA: Diagnosis not present

## 2016-11-19 DIAGNOSIS — M1990 Unspecified osteoarthritis, unspecified site: Secondary | ICD-10-CM | POA: Diagnosis not present

## 2016-11-19 DIAGNOSIS — Z9181 History of falling: Secondary | ICD-10-CM | POA: Diagnosis not present

## 2016-11-19 DIAGNOSIS — I10 Essential (primary) hypertension: Secondary | ICD-10-CM | POA: Diagnosis not present

## 2016-11-19 DIAGNOSIS — M545 Low back pain: Secondary | ICD-10-CM | POA: Diagnosis not present

## 2016-11-19 DIAGNOSIS — Z96651 Presence of right artificial knee joint: Secondary | ICD-10-CM | POA: Diagnosis not present

## 2016-11-19 DIAGNOSIS — Z471 Aftercare following joint replacement surgery: Secondary | ICD-10-CM | POA: Diagnosis not present

## 2016-11-20 ENCOUNTER — Ambulatory Visit (INDEPENDENT_AMBULATORY_CARE_PROVIDER_SITE_OTHER): Payer: Medicare HMO | Admitting: Family Medicine

## 2016-11-20 ENCOUNTER — Encounter: Payer: Self-pay | Admitting: Family Medicine

## 2016-11-20 ENCOUNTER — Ambulatory Visit (HOSPITAL_COMMUNITY)
Admission: RE | Admit: 2016-11-20 | Discharge: 2016-11-20 | Disposition: A | Discharge status: Home / Self Care | Payer: Medicare HMO | Source: Ambulatory Visit | Attending: Family Medicine | Admitting: Family Medicine

## 2016-11-20 VITALS — BP 132/64 | HR 85 | Temp 98.2°F | Ht 61.0 in | Wt 180.0 lb

## 2016-11-20 DIAGNOSIS — F1210 Cannabis abuse, uncomplicated: Secondary | ICD-10-CM | POA: Diagnosis not present

## 2016-11-20 DIAGNOSIS — F1010 Alcohol abuse, uncomplicated: Secondary | ICD-10-CM | POA: Diagnosis not present

## 2016-11-20 DIAGNOSIS — Z96651 Presence of right artificial knee joint: Secondary | ICD-10-CM | POA: Diagnosis not present

## 2016-11-20 DIAGNOSIS — M7989 Other specified soft tissue disorders: Secondary | ICD-10-CM

## 2016-11-20 DIAGNOSIS — R000 Tachycardia, unspecified: Secondary | ICD-10-CM

## 2016-11-20 DIAGNOSIS — Z96659 Presence of unspecified artificial knee joint: Secondary | ICD-10-CM | POA: Diagnosis not present

## 2016-11-20 NOTE — Assessment & Plan Note (Signed)
Still avgs 4-5 glass of wine a night, we discussed health benefits of cutting back but she is not interested at this time

## 2016-11-20 NOTE — Assessment & Plan Note (Addendum)
Right sided assymetric leg swelling in distal leg s/p right knee replacement one month ago, patient was not taking their anticoagulation  STAT dvt ultrasound ordered and patient walked across immediately to hospital.  DVT ultrasound was negative.

## 2016-11-20 NOTE — Progress Notes (Signed)
**  Preliminary report by tech**  Right lower extremity venous duplex complete. There is no evidence of deep or superficial vein thrombosis involving the right lower extremity. All visualized vessels appear patent and compressible. There is no evidence of a Baker's cyst on the right. Results were given to Dr. Criss Rosales  11/20/16 4:09 PM Carrie [name] RVT

## 2016-11-20 NOTE — Progress Notes (Signed)
    Subjective:  Carrie [name] is a 66 y.o. female who presents to the [facility] today with a chief complaint of leg swelling s/p knee surgery 4wks ago.Marland Kitchen   HPI: Ms. Glasser had R knee replacement with no major complications 4wks ago.   She has been meeting with therapy but not doing her exercises.   She says she has been walking fine sice the surgery and had only minor pain.   She is however complaining of swelling from the knee down to her foot which yesterday she says was so swollen she couldn't dosiflex.  Her knee has been swollen and warm.   She was not taking her prescribed aspirin because she thinks it makes her "jittery", she has only sporadically been using the oxycodone.  She has not had fevers, chest pain, stroke symptoms or SOB.  She says she cannot use ice on her knee because it hurts.    Objective:  Physical Exam: BP 132/64   Pulse 85   Temp 98.2 F (36.8 C) (Oral)   Ht 5\' 1"  (1.549 m)   Wt 180 lb (81.6 kg)   SpO2 99%   BMI 34.01 kg/m   Gen: NAD, resting comfortably CV: RRR with no murmurs appreciated Pulm: NWOB, CTAB with no crackles, wheezes, or rhonchi GI: Normal bowel sounds present. Soft, Nontender, Nondistended. MSK: no cyanosis, or clubbing noted.. R knee is assymetrically swollen and warm to touch although the wound was not erythemotous and there was no sign of purulence, swelling extended to foot, distal pulses/sensation/movement intact Skin: warm, dry Neuro: grossly normal, moves all extremities Psych: Normal affect and thought content  No results found for this or any previous visit (from the past 72 hour(s)).   Assessment/Plan:  Leg swelling Right sided assymetric leg swelling in distal leg s/p right knee replacement one month ago, patient was not taking their anticoagulation  STAT dvt ultrasound ordered and patient walked across immediately to hospital.  DVT ultrasound was negative.  Alcohol abuse Still avgs 4-5 glass of wine a night, we discussed health  benefits of cutting back but she is not interested at this time   Sherene Sires, [place] - PGY1 11/20/2016 4:56 PM

## 2016-11-20 NOTE — Patient Instructions (Signed)
Carrie [name],  You came in today to followup after surgery and you had a complaint of swelling in your right leg and knee.   Given these complaints after a surgery and since you weren't able to take the prescribed aspirin we are sending you to the hospital to get an ultrasound of your veins to make sure you don't have a clot.  This test should tell if there is a clot in your leg but once you are home it is important to know signs of clots.   Rapid swelling in a leg/arm, sudden trouble breathing or chest pain, facial droop on one side or sudden weakness on one side. Please call 911 if those happen.  thanks

## 2016-11-21 DIAGNOSIS — M545 Low back pain: Secondary | ICD-10-CM | POA: Diagnosis not present

## 2016-11-21 DIAGNOSIS — I10 Essential (primary) hypertension: Secondary | ICD-10-CM | POA: Diagnosis not present

## 2016-11-21 DIAGNOSIS — Z96651 Presence of right artificial knee joint: Secondary | ICD-10-CM | POA: Diagnosis not present

## 2016-11-21 DIAGNOSIS — Z79891 Long term (current) use of opiate analgesic: Secondary | ICD-10-CM | POA: Diagnosis not present

## 2016-11-21 DIAGNOSIS — M1990 Unspecified osteoarthritis, unspecified site: Secondary | ICD-10-CM | POA: Diagnosis not present

## 2016-11-21 DIAGNOSIS — M48062 Spinal stenosis, lumbar region with neurogenic claudication: Secondary | ICD-10-CM | POA: Diagnosis not present

## 2016-11-21 DIAGNOSIS — Z471 Aftercare following joint replacement surgery: Secondary | ICD-10-CM | POA: Diagnosis not present

## 2016-11-21 DIAGNOSIS — Z9181 History of falling: Secondary | ICD-10-CM | POA: Diagnosis not present

## 2016-11-26 DIAGNOSIS — I10 Essential (primary) hypertension: Secondary | ICD-10-CM | POA: Diagnosis not present

## 2016-11-26 DIAGNOSIS — Z79891 Long term (current) use of opiate analgesic: Secondary | ICD-10-CM | POA: Diagnosis not present

## 2016-11-26 DIAGNOSIS — M1990 Unspecified osteoarthritis, unspecified site: Secondary | ICD-10-CM | POA: Diagnosis not present

## 2016-11-26 DIAGNOSIS — Z96651 Presence of right artificial knee joint: Secondary | ICD-10-CM | POA: Diagnosis not present

## 2016-11-26 DIAGNOSIS — Z471 Aftercare following joint replacement surgery: Secondary | ICD-10-CM | POA: Diagnosis not present

## 2016-11-26 DIAGNOSIS — M545 Low back pain: Secondary | ICD-10-CM | POA: Diagnosis not present

## 2016-11-26 DIAGNOSIS — M48062 Spinal stenosis, lumbar region with neurogenic claudication: Secondary | ICD-10-CM | POA: Diagnosis not present

## 2016-11-26 DIAGNOSIS — Z9181 History of falling: Secondary | ICD-10-CM | POA: Diagnosis not present

## 2016-11-28 DIAGNOSIS — I10 Essential (primary) hypertension: Secondary | ICD-10-CM | POA: Diagnosis not present

## 2016-11-28 DIAGNOSIS — Z9181 History of falling: Secondary | ICD-10-CM | POA: Diagnosis not present

## 2016-11-28 DIAGNOSIS — M48062 Spinal stenosis, lumbar region with neurogenic claudication: Secondary | ICD-10-CM | POA: Diagnosis not present

## 2016-11-28 DIAGNOSIS — Z96651 Presence of right artificial knee joint: Secondary | ICD-10-CM | POA: Diagnosis not present

## 2016-11-28 DIAGNOSIS — M545 Low back pain: Secondary | ICD-10-CM | POA: Diagnosis not present

## 2016-11-28 DIAGNOSIS — Z471 Aftercare following joint replacement surgery: Secondary | ICD-10-CM | POA: Diagnosis not present

## 2016-11-28 DIAGNOSIS — Z79891 Long term (current) use of opiate analgesic: Secondary | ICD-10-CM | POA: Diagnosis not present

## 2016-11-28 DIAGNOSIS — M1990 Unspecified osteoarthritis, unspecified site: Secondary | ICD-10-CM | POA: Diagnosis not present

## 2016-12-03 ENCOUNTER — Ambulatory Visit (INDEPENDENT_AMBULATORY_CARE_PROVIDER_SITE_OTHER): Payer: Medicare HMO | Admitting: Orthopaedic Surgery

## 2016-12-03 ENCOUNTER — Encounter (INDEPENDENT_AMBULATORY_CARE_PROVIDER_SITE_OTHER): Payer: Self-pay | Admitting: Orthopaedic Surgery

## 2016-12-03 VITALS — BP 145/80 | HR 78 | Ht 61.0 in | Wt 180.0 lb

## 2016-12-03 DIAGNOSIS — Z96651 Presence of right artificial knee joint: Secondary | ICD-10-CM

## 2016-12-03 NOTE — Progress Notes (Signed)
Office Visit Note   Patient: Carrie [name]           Date of Birth: 1950-06-17           MRN: [id number] Visit Date: 12/03/2016              Requested by: Sherene Sires, DO [address], [place] [address] PCP: Sherene Sires, DO   Assessment & Plan: Visit Diagnoses:  1. S/P total knee arthroplasty, right     Plan: Patient is full extension she has good flexion to under 20 scar looks good. She needs to work on low bit more quad strengthening to help with her gait since she tends to keep her knee slightly flexed with ambulation in a slightly flexed at the waist. I'll check her back again in 6 months. Opposite knee doesn't give her as much trouble as a right knee did before surgery.  Follow-Up Instructions: Return in about 6 months (around 06/02/2017), or if symptoms worsen or fail to improve.   Orders:  No orders of the defined types were placed in this encounter.  No orders of the defined types were placed in this encounter.     Procedures: No procedures performed   Clinical Data: No additional findings.   Subjective: Chief Complaint  Patient presents with  . Right Knee - Routine Post Op    HPI post right total knee arthroplasty 10/23/2016 making good progress is immature without a cane.  Review of Systems unchanged   Objective: Vital Signs: BP (!) 145/80   Pulse 78   Ht 5\' 1"  (1.549 m)   Wt 180 lb (81.6 kg)   BMI 34.01 kg/m   Physical Exam knee reaches full extension flexion 120 collateral ligaments are stable minimal swelling. Scar looks good.  Ortho Exam  Specialty Comments:  No specialty comments available.  Imaging: No results found.   PMFS History: Patient Active Problem List   Diagnosis Date Noted  . Leg swelling 11/20/2016  . Mild tetrahydrocannabinol (THC) abuse 11/20/2016  . Arthritis of right knee 10/23/2016  . Pain in right foot 09/01/2016  . Impingement syndrome of left shoulder 09/01/2016  . Spinal stenosis of lumbar  region with neurogenic claudication 02/14/2016  . Eustachian tube dysfunction 09/04/2015  . Back pain of thoracolumbar region 05/08/2015  . Epidermoid cyst of skin 01/10/2015  . Muscle cramping 12/05/2014  . Low back pain 06/14/2014  . Preventative health care 03/15/2014  . Diabetes mellitus screening 02/22/2014  . Bilateral knee pain 02/08/2014  . Transaminitis 07/23/2013  . Essential hypertension, benign 07/23/2013  . GERD (gastroesophageal reflux disease) 07/23/2013  . Vitamin D deficiency 07/22/2013  . Breast cancer, left breast ([place]) 07/17/2011  . FIBROIDS, UTERUS 01/30/2007  . Alcohol abuse 01/30/2007   Past Medical History:  Diagnosis Date  . Arthritis    "back, right hand" (07/11/2014)  . Breast cancer, right breast ([place]) 05/2009  . Chronic lower back pain   . GERD (gastroesophageal reflux disease)   . Hepatic steatosis 07/17/2011  . Hypertension   . Hypokalemia 07/17/2011  . Lower GI bleeding 07/11/2014 hospitalized    Family History  Problem Relation Age of Onset  . Cancer Other   . Diabetes Mother   . Hypertension Mother   . Diabetes Brother   . Hypertension Father   . Arthritis/Rheumatoid Father   . Cancer Father   . Cancer Maternal Grandfather   . Heart disease Brother     Past Surgical History:  Procedure Laterality  Date  . BREAST BIOPSY Right 2011  . BREAST LUMPECTOMY Right 2011  . COLONOSCOPY    . ESOPHAGOGASTRODUODENOSCOPY N/A 07/12/2014   Procedure: ESOPHAGOGASTRODUODENOSCOPY (EGD);  Surgeon: Clarene Essex, MD;  Location: [facility] ENDOSCOPY;  Service: Endoscopy;  Laterality: N/A;  . FINGER SURGERY     middle finger on left hand  . MASTECTOMY COMPLETE / SIMPLE W/ SENTINEL NODE BIOPSY  05/2009   Archie Endo 05/23/2009  . TOTAL KNEE ARTHROPLASTY Right 10/23/2016  . TOTAL KNEE ARTHROPLASTY Right 10/23/2016   Procedure: RIGHT TOTAL KNEE ARTHROPLASTY;  Surgeon: [name], MD;  Location: [place];  Service: Orthopedics;  Laterality: Right;   Social History    Occupational History  . Not on file.   Social History Main Topics  . Smoking status: Never Smoker  . Smokeless tobacco: Never Used  . Alcohol use 50.4 oz/week    42 Cans of beer, 42 Shots of liquor per week     Comment: daily since her 20s several a day   . Drug use: Yes    Frequency: 7.0 times per week    Types: Marijuana  . Sexual activity: Not Currently    Birth control/ protection: Post-menopausal

## 2016-12-04 DIAGNOSIS — I10 Essential (primary) hypertension: Secondary | ICD-10-CM | POA: Diagnosis not present

## 2016-12-04 DIAGNOSIS — Z79891 Long term (current) use of opiate analgesic: Secondary | ICD-10-CM | POA: Diagnosis not present

## 2016-12-04 DIAGNOSIS — M48062 Spinal stenosis, lumbar region with neurogenic claudication: Secondary | ICD-10-CM | POA: Diagnosis not present

## 2016-12-04 DIAGNOSIS — Z96651 Presence of right artificial knee joint: Secondary | ICD-10-CM | POA: Diagnosis not present

## 2016-12-04 DIAGNOSIS — Z471 Aftercare following joint replacement surgery: Secondary | ICD-10-CM | POA: Diagnosis not present

## 2016-12-04 DIAGNOSIS — Z9181 History of falling: Secondary | ICD-10-CM | POA: Diagnosis not present

## 2016-12-04 DIAGNOSIS — M1990 Unspecified osteoarthritis, unspecified site: Secondary | ICD-10-CM | POA: Diagnosis not present

## 2016-12-04 DIAGNOSIS — M545 Low back pain: Secondary | ICD-10-CM | POA: Diagnosis not present

## 2016-12-06 DIAGNOSIS — Z9181 History of falling: Secondary | ICD-10-CM | POA: Diagnosis not present

## 2016-12-06 DIAGNOSIS — M545 Low back pain: Secondary | ICD-10-CM | POA: Diagnosis not present

## 2016-12-06 DIAGNOSIS — M48062 Spinal stenosis, lumbar region with neurogenic claudication: Secondary | ICD-10-CM | POA: Diagnosis not present

## 2016-12-06 DIAGNOSIS — I10 Essential (primary) hypertension: Secondary | ICD-10-CM | POA: Diagnosis not present

## 2016-12-06 DIAGNOSIS — Z471 Aftercare following joint replacement surgery: Secondary | ICD-10-CM | POA: Diagnosis not present

## 2016-12-06 DIAGNOSIS — Z96651 Presence of right artificial knee joint: Secondary | ICD-10-CM | POA: Diagnosis not present

## 2016-12-06 DIAGNOSIS — Z79891 Long term (current) use of opiate analgesic: Secondary | ICD-10-CM | POA: Diagnosis not present

## 2016-12-06 DIAGNOSIS — M1990 Unspecified osteoarthritis, unspecified site: Secondary | ICD-10-CM | POA: Diagnosis not present

## 2016-12-09 ENCOUNTER — Ambulatory Visit: Payer: Medicare HMO | Admitting: Family Medicine

## 2016-12-11 DIAGNOSIS — M48062 Spinal stenosis, lumbar region with neurogenic claudication: Secondary | ICD-10-CM | POA: Diagnosis not present

## 2016-12-11 DIAGNOSIS — Z96651 Presence of right artificial knee joint: Secondary | ICD-10-CM | POA: Diagnosis not present

## 2016-12-11 DIAGNOSIS — Z79891 Long term (current) use of opiate analgesic: Secondary | ICD-10-CM | POA: Diagnosis not present

## 2016-12-11 DIAGNOSIS — Z9181 History of falling: Secondary | ICD-10-CM | POA: Diagnosis not present

## 2016-12-11 DIAGNOSIS — M1990 Unspecified osteoarthritis, unspecified site: Secondary | ICD-10-CM | POA: Diagnosis not present

## 2016-12-11 DIAGNOSIS — Z471 Aftercare following joint replacement surgery: Secondary | ICD-10-CM | POA: Diagnosis not present

## 2016-12-11 DIAGNOSIS — M545 Low back pain: Secondary | ICD-10-CM | POA: Diagnosis not present

## 2016-12-11 DIAGNOSIS — I10 Essential (primary) hypertension: Secondary | ICD-10-CM | POA: Diagnosis not present

## 2016-12-31 ENCOUNTER — Ambulatory Visit (INDEPENDENT_AMBULATORY_CARE_PROVIDER_SITE_OTHER): Payer: Medicare HMO | Admitting: *Deleted

## 2016-12-31 ENCOUNTER — Encounter: Payer: Self-pay | Admitting: *Deleted

## 2016-12-31 VITALS — BP 132/70 | HR 76 | Temp 98.2°F | Ht 61.0 in | Wt 180.6 lb

## 2016-12-31 DIAGNOSIS — Z23 Encounter for immunization: Secondary | ICD-10-CM

## 2016-12-31 DIAGNOSIS — Z114 Encounter for screening for human immunodeficiency virus [HIV]: Secondary | ICD-10-CM | POA: Diagnosis not present

## 2016-12-31 DIAGNOSIS — R739 Hyperglycemia, unspecified: Secondary | ICD-10-CM

## 2016-12-31 DIAGNOSIS — Z0000 Encounter for general adult medical examination without abnormal findings: Secondary | ICD-10-CM

## 2016-12-31 LAB — POCT GLYCOSYLATED HEMOGLOBIN (HGB A1C): HEMOGLOBIN A1C: 5.1

## 2016-12-31 NOTE — Progress Notes (Signed)
Subjective:   Carrie [name] is a 66 y.o. female who presents for Medicare Annual (Subsequent) preventive examination.  Cardiac Risk Factors include: hypertension;obesity (BMI >30kg/m2);sedentary lifestyle     Objective:     Vitals: BP (!) 150/80 (BP Location: Right Arm, Patient Position: Sitting, Cuff Size: Normal)   Pulse 76   Temp 98.2 F (36.8 C) (Oral)   Ht 5\' 1"  (1.549 m)   Wt 180 lb 9.6 oz (81.9 kg)   SpO2 99%   BMI 34.12 kg/m   Body mass index is 34.12 kg/m.  BP recheck 132/70 right arm manually with adult cuff. Patient reports missing doses of amlodipine often, at least 2 days per week sometimes more. Has not taken today's dose. Stressed importance of taking med same time everyday to reduce risk of heart attack and stroke. Patient does not seem to think this is important.  Tobacco History  Smoking Status  . Never Smoker  Smokeless Tobacco  . Never Used     Counseling given: Yes Patient has never smoked cigarettes and has no plans to start. Smokes marijuana daily.   Past Medical History:  Diagnosis Date  . Arthritis    "back, right hand" (07/11/2014)  . Breast cancer, right breast ([place]) 05/2009  . Chronic lower back pain   . GERD (gastroesophageal reflux disease)   . Hepatic steatosis 07/17/2011  . Hypertension   . Hypokalemia 07/17/2011  . Lower GI bleeding 07/11/2014 hospitalized   Past Surgical History:  Procedure Laterality Date  . BREAST BIOPSY Right 2011  . BREAST LUMPECTOMY Right 2011  . COLONOSCOPY    . ESOPHAGOGASTRODUODENOSCOPY N/A 07/12/2014   Procedure: ESOPHAGOGASTRODUODENOSCOPY (EGD);  Surgeon: Clarene Essex, MD;  Location: [facility] ENDOSCOPY;  Service: Endoscopy;  Laterality: N/A;  . FINGER SURGERY     middle finger on left hand  . MASTECTOMY COMPLETE / SIMPLE W/ SENTINEL NODE BIOPSY  05/2009   Archie Endo 05/23/2009  . TOTAL KNEE ARTHROPLASTY Right 10/23/2016  . TOTAL KNEE ARTHROPLASTY Right 10/23/2016   Procedure: RIGHT TOTAL KNEE ARTHROPLASTY;  Surgeon:  [name], MD;  Location: [place];  Service: Orthopedics;  Laterality: Right;   Family History  Problem Relation Age of Onset  . Cancer Other   . Diabetes Mother   . Hypertension Mother   . Diabetes Brother   . Hypertension Father   . Arthritis/Rheumatoid Father   . Cancer Father   . Cancer Maternal Grandfather   . Heart disease Brother    History  Sexual Activity  . Sexual activity: Yes  . Birth control/ protection: Post-menopausal    Comment: 2 partners in last year without condom use    Outpatient Encounter Prescriptions as of 12/31/2016  Medication Sig  . amLODipine (NORVASC) 10 MG tablet take 1 tablet by mouth once daily  . methocarbamol (ROBAXIN) 500 MG tablet Take 1 tablet (500 mg total) by mouth every 6 (six) hours as needed for muscle spasms.  . pantoprazole (PROTONIX) 20 MG tablet Take 1 tablet (20 mg total) by mouth daily.  . calcium carbonate (OSCAL) 1500 (600 Ca) MG TABS tablet Take 600 mg by mouth daily.  . [DISCONTINUED] fluticasone (FLONASE) 50 MCG/ACT nasal spray Place 2 sprays into both nostrils daily. (Patient not taking: Reported on 12/31/2016)  . [DISCONTINUED] nystatin cream (MYCOSTATIN) Apply 1 application topically 2 (two) times daily. (Patient not taking: Reported on 12/31/2016)   No facility-administered encounter medications on file as of 12/31/2016.     Activities of Daily Living In your  present state of health, do you have any difficulty performing the following activities: 12/31/2016 10/24/2016  Hearing? Y -  Comment tinnitus -  Vision? N -  Difficulty concentrating or making decisions? N -  Walking or climbing stairs? N -  Dressing or bathing? N -  Doing errands, shopping? N Y  Conservation officer, nature and eating ? N -  Using the Toilet? N -  In the past six months, have you accidently leaked urine? Y -  Do you have problems with loss of bowel control? N -  Managing your Medications? N -  Managing your Finances? N -  Housekeeping or managing your  Housekeeping? N -  Some recent data might be hidden   Home Safety:  My home has a working smoke alarm:  Yes X 2            My home throw rugs have been fastened down to the floor or removed:  Removed I have a non-slip surface or non-slip mats in the bathtub and shower:  Shower chair and grab bars        All my home's stairs have handrails, including any outdoor stairs  One level home with 3 outside steps with handrails          My home's floors, stairs and hallways are free from clutter, wires and cords:  Yes     I have animals in my home  No I wear seatbelts consistently:  Yes    Patient Care Team: Sherene Sires, DO as PCP - General [name], MD as Consulting Physician (Orthopedic Surgery)    Assessment:     Exercise Activities and Dietary recommendations Current Exercise Habits: The patient does not participate in regular exercise at present, Exercise limited by: None identified  Goals    . Blood Pressure < 140/90    . Weight (lb) < 178 lb (80.7 kg)          7% weight loss      Patient is not interested in physical activity or weight loss at this time.  Fall Risk Fall Risk  12/31/2016 08/22/2016 08/14/2016 06/07/2016 01/25/2016  Falls in the past year? Yes No No No No  Number falls in past yr: 1 - - - -  Injury with Fall? Yes - - - -  Comment right knee - - - -  Risk Factor Category  (No Data) - - - -  Comment PCP notified - - - -  Risk for fall due to : (No Data) - - - -  Risk for fall due to: Comment uneven walkway - - - -  Follow up Falls evaluation completed;Education provided;Falls prevention discussed - - - -   Depression Screen PHQ 2/9 Scores 12/31/2016 11/20/2016 08/22/2016 08/14/2016  PHQ - 2 Score 0 0 0 0    TUG Test:  Done in 15 seconds. Patient used both hands to push out of chair and one hand to sit back down. Falls prevention discussed in detail and literature given.  Cognitive Function: Mini-Cog  Failed with score 2/5    Immunization History    Administered Date(s) Administered  . Influenza,inj,Quad PF,6+ Mos 03/15/2014, 02/09/2015  . Pneumococcal Polysaccharide-23 07/13/2014  . Td 02/05/1999  . Tdap 03/15/2014   Screening Tests Health Maintenance  Topic Date Due  . INFLUENZA VACCINE  10/30/2016  . PAP SMEAR  03/15/2017  . MAMMOGRAM  12/17/2017  . COLONOSCOPY  08/28/2022  . TETANUS/TDAP  03/15/2024  . Hepatitis C Screening  Completed  . HIV Screening  Completed   Flu vaccine administered today HIV done today. Patient has had 2 partners in last year without condom use. Discussed importance of using condoms to decrease risk of STI. Hgb A1c done = 5.1 Last A1c 5.7 on 02/21/2014    Plan:      I have personally reviewed and noted the following in the patient's chart:   . Medical and social history . Use of alcohol, tobacco or illicit drugs  . Current medications and supplements . Functional ability and status . Nutritional status . Physical activity . Advanced directives . List of other physicians . Hospitalizations, surgeries, and ER visits in previous 12 months . Vitals . Screenings to include cognitive, depression, and falls . Referrals and appointments  In addition, I have reviewed and discussed with patient certain preventive protocols, quality metrics, and best practice recommendations. A written personalized care plan for preventive services as well as general preventive health recommendations were provided to patient.     Velora Heckler, RN  12/31/2016

## 2016-12-31 NOTE — Patient Instructions (Addendum)
Carrie [name],  Thank you for taking time to come for yourMedicare Wellness Visit. I appreciate your ongoing commitment to your health goals. Please review the following plan we discussed and let me know if I can assist you in the future.   These are the goals we discussed:  Goals    . Blood Pressure < 140/90    . Weight (lb) < 178 lb (80.7 kg)          7% weight loss           Fall Prevention in the Home Falls can cause injuries. They can happen to people of all ages. There are many things you can do to make your home safe and to help prevent falls. What can I do on the outside of my home?  Regularly fix the edges of walkways and driveways and fix any cracks.  Remove anything that might make you trip as you walk through a door, such as a raised step or threshold.  Trim any bushes or trees on the path to your home.  Use bright outdoor lighting.  Clear any walking paths of anything that might make someone trip, such as rocks or tools.  Regularly check to see if handrails are loose or broken. Make sure that both sides of any steps have handrails.  Any raised decks and porches should have guardrails on the edges.  Have any leaves, snow, or ice cleared regularly.  Use sand or salt on walking paths during winter.  Clean up any spills in your garage right away. This includes oil or grease spills. What can I do in the bathroom?  Use night lights.  Install grab bars by the toilet and in the tub and shower. Do not use towel bars as grab bars.  Use non-skid mats or decals in the tub or shower.  If you need to sit down in the shower, use a plastic, non-slip stool.  Keep the floor dry. Clean up any water that spills on the floor as soon as it happens.  Remove soap buildup in the tub or shower regularly.  Attach bath mats securely with double-sided non-slip rug tape.  Do not have throw rugs and other things on the floor that can make you trip. What can I do in the  bedroom?  Use night lights.  Make sure that you have a light by your bed that is easy to reach.  Do not use any sheets or blankets that are too big for your bed. They should not hang down onto the floor.  Have a firm chair that has side arms. You can use this for support while you get dressed.  Do not have throw rugs and other things on the floor that can make you trip. What can I do in the kitchen?  Clean up any spills right away.  Avoid walking on wet floors.  Keep items that you use a lot in easy-to-reach places.  If you need to reach something above you, use a strong step stool that has a grab bar.  Keep electrical cords out of the way.  Do not use floor polish or wax that makes floors slippery. If you must use wax, use non-skid floor wax.  Do not have throw rugs and other things on the floor that can make you trip. What can I do with my stairs?  Do not leave any items on the stairs.  Make sure that there are handrails on both sides of the stairs and  use them. Fix handrails that are broken or loose. Make sure that handrails are as long as the stairways.  Check any carpeting to make sure that it is firmly attached to the stairs. Fix any carpet that is loose or worn.  Avoid having throw rugs at the top or bottom of the stairs. If you do have throw rugs, attach them to the floor with carpet tape.  Make sure that you have a light switch at the top of the stairs and the bottom of the stairs. If you do not have them, ask someone to add them for you. What else can I do to help prevent falls?  Wear shoes that: ? Do not have high heels. ? Have rubber bottoms. ? Are comfortable and fit you well. ? Are closed at the toe. Do not wear sandals.  If you use a stepladder: ? Make sure that it is fully opened. Do not climb a closed stepladder. ? Make sure that both sides of the stepladder are locked into place. ? Ask someone to hold it for you, if possible.  Clearly mark and make  sure that you can see: ? Any grab bars or handrails. ? First and last steps. ? Where the edge of each step is.  Use tools that help you move around (mobility aids) if they are needed. These include: ? Canes. ? Walkers. ? Scooters. ? Crutches.  Turn on the lights when you go into a dark area. Replace any light bulbs as soon as they burn out.  Set up your furniture so you have a clear path. Avoid moving your furniture around.  If any of your floors are uneven, fix them.  If there are any pets around you, be aware of where they are.  Review your medicines with your doctor. Some medicines can make you feel dizzy. This can increase your chance of falling. Ask your doctor what other things that you can do to help prevent falls. This information is not intended to replace advice given to you by your health care provider. Make sure you discuss any questions you have with your health care provider. Document Released: 01/12/2009 Document Revised: 08/24/2015 Document Reviewed: 04/22/2014 Elsevier Interactive Patient Education  2018 [employer] Maintenance, Female Adopting a healthy lifestyle and getting preventive care can go a long way to promote health and wellness. Talk with your health care provider about what schedule of regular examinations is right for you. This is a good chance for you to check in with your provider about disease prevention and staying healthy. In between checkups, there are plenty of things you can do on your own. Experts have done a lot of research about which lifestyle changes and preventive measures are most likely to keep you healthy. Ask your health care provider for more information. Weight and diet Eat a healthy diet  Be sure to include plenty of vegetables, fruits, low-fat dairy products, and lean protein.  Do not eat a lot of foods high in solid fats, added sugars, or salt.  Get regular exercise. This is one of the most important things you can do  for your health. ? Most adults should exercise for at least 150 minutes each week. The exercise should increase your heart rate and make you sweat (moderate-intensity exercise). ? Most adults should also do strengthening exercises at least twice a week. This is in addition to the moderate-intensity exercise.  Maintain a healthy weight  Body mass index (BMI) is a measurement that  can be used to identify possible weight problems. It estimates body fat based on height and weight. Your health care provider can help determine your BMI and help you achieve or maintain a healthy weight.  For females 64 years of age and older: ? A BMI below 18.5 is considered underweight. ? A BMI of 18.5 to 24.9 is normal. ? A BMI of 25 to 29.9 is considered overweight. ? A BMI of 30 and above is considered obese.  Watch levels of cholesterol and blood lipids  You should start having your blood tested for lipids and cholesterol at 64 years of age, then have this test every 5 years.  You may need to have your cholesterol levels checked more often if: ? Your lipid or cholesterol levels are high. ? You are older than 64 years of age. ? You are at high risk for heart disease.  Cancer screening Lung Cancer  Lung cancer screening is recommended for adults 68-34 years old who are at high risk for lung cancer because of a history of smoking.  A yearly low-dose CT scan of the lungs is recommended for people who: ? Currently smoke. ? Have quit within the past 15 years. ? Have at least a 30-pack-year history of smoking. A pack year is smoking an average of one pack of cigarettes a day for 1 year.  Yearly screening should continue until it has been 15 years since you quit.  Yearly screening should stop if you develop a health problem that would prevent you from having lung cancer treatment.  Breast Cancer  Practice breast self-awareness. This means understanding how your breasts normally appear and feel.  It  also means doing regular breast self-exams. Let your health care provider know about any changes, no matter how small.  If you are in your 20s or 30s, you should have a clinical breast exam (CBE) by a health care provider every 1-3 years as part of a regular health exam.  If you are 24 or older, have a CBE every year. Also consider having a breast X-ray (mammogram) every year.  If you have a family history of breast cancer, talk to your health care provider about genetic screening.  If you are at high risk for breast cancer, talk to your health care provider about having an MRI and a mammogram every year.  Breast cancer gene (BRCA) assessment is recommended for women who have family members with BRCA-related cancers. BRCA-related cancers include: ? Breast. ? Ovarian. ? Tubal. ? Peritoneal cancers.  Results of the assessment will determine the need for genetic counseling and BRCA1 and BRCA2 testing.  Cervical Cancer Your health care provider may recommend that you be screened regularly for cancer of the pelvic organs (ovaries, uterus, and vagina). This screening involves a pelvic examination, including checking for microscopic changes to the surface of your cervix (Pap test). You may be encouraged to have this screening done every 3 years, beginning at age 64.  For women ages 66-65, health care providers may recommend pelvic exams and Pap testing every 3 years, or they may recommend the Pap and pelvic exam, combined with testing for human papilloma virus (HPV), every 5 years. Some types of HPV increase your risk of cervical cancer. Testing for HPV may also be done on women of any age with unclear Pap test results.  Other health care providers may not recommend any screening for nonpregnant women who are considered low risk for pelvic cancer and who do not have symptoms.  Ask your health care provider if a screening pelvic exam is right for you.  If you have had past treatment for cervical  cancer or a condition that could lead to cancer, you need Pap tests and screening for cancer for at least 20 years after your treatment. If Pap tests have been discontinued, your risk factors (such as having a new sexual partner) need to be reassessed to determine if screening should resume. Some women have medical problems that increase the chance of getting cervical cancer. In these cases, your health care provider may recommend more frequent screening and Pap tests.  Colorectal Cancer  This type of cancer can be detected and often prevented.  Routine colorectal cancer screening usually begins at 65 years of age and continues through 64 years of age.  Your health care provider may recommend screening at an earlier age if you have risk factors for colon cancer.  Your health care provider may also recommend using home test kits to check for hidden blood in the stool.  A small camera at the end of a tube can be used to examine your colon directly (sigmoidoscopy or colonoscopy). This is done to check for the earliest forms of colorectal cancer.  Routine screening usually begins at age 14.  Direct examination of the colon should be repeated every 5-10 years through 64 years of age. However, you may need to be screened more often if early forms of precancerous polyps or small growths are found.  Skin Cancer  Check your skin from head to toe regularly.  Tell your health care provider about any new moles or changes in moles, especially if there is a change in a mole's shape or color.  Also tell your health care provider if you have a mole that is larger than the size of a pencil eraser.  Always use sunscreen. Apply sunscreen liberally and repeatedly throughout the day.  Protect yourself by wearing long sleeves, pants, a wide-brimmed hat, and sunglasses whenever you are outside.  Heart disease, diabetes, and high blood pressure  High blood pressure causes heart disease and increases the risk  of stroke. High blood pressure is more likely to develop in: ? People who have blood pressure in the high end of the normal range (130-139/85-89 mm Hg). ? People who are overweight or obese. ? People who are African American.  If you are 87-12 years of age, have your blood pressure checked every 3-5 years. If you are 38 years of age or older, have your blood pressure checked every year. You should have your blood pressure measured twice-once when you are at a hospital or clinic, and once when you are not at a hospital or clinic. Record the average of the two measurements. To check your blood pressure when you are not at a hospital or clinic, you can use: ? An automated blood pressure machine at a pharmacy. ? A home blood pressure monitor.  If you are between 87 years and 24 years old, ask your health care provider if you should take aspirin to prevent strokes.  Have regular diabetes screenings. This involves taking a blood sample to check your fasting blood sugar level. ? If you are at a normal weight and have a low risk for diabetes, have this test once every three years after 64 years of age. ? If you are overweight and have a high risk for diabetes, consider being tested at a younger age or more often. Preventing infection Hepatitis B  If you  have a higher risk for hepatitis B, you should be screened for this virus. You are considered at high risk for hepatitis B if: ? You were born in a country where hepatitis B is common. Ask your health care provider which countries are considered high risk. ? Your parents were born in a high-risk country, and you have not been immunized against hepatitis B (hepatitis B vaccine). ? You have HIV or AIDS. ? You use needles to inject street drugs. ? You live with someone who has hepatitis B. ? You have had sex with someone who has hepatitis B. ? You get hemodialysis treatment. ? You take certain medicines for conditions, including cancer, organ  transplantation, and autoimmune conditions.  Hepatitis C  Blood testing is recommended for: ? Everyone born from [birth year] through 1963. ? Anyone with known risk factors for hepatitis C.  Sexually transmitted infections (STIs)  You should be screened for sexually transmitted infections (STIs) including gonorrhea and chlamydia if: ? You are sexually active and are younger than 64 years of age. ? You are older than 64 years of age and your health care provider tells you that you are at risk for this type of infection. ? Your sexual activity has changed since you were last screened and you are at an increased risk for chlamydia or gonorrhea. Ask your health care provider if you are at risk.  If you do not have HIV, but are at risk, it may be recommended that you take a prescription medicine daily to prevent HIV infection. This is called pre-exposure prophylaxis (PrEP). You are considered at risk if: ? You are sexually active and do not regularly use condoms or know the HIV status of your partner(s). ? You take drugs by injection. ? You are sexually active with a partner who has HIV.  Talk with your health care provider about whether you are at high risk of being infected with HIV. If you choose to begin PrEP, you should first be tested for HIV. You should then be tested every 3 months for as long as you are taking PrEP. Pregnancy  If you are premenopausal and you may become pregnant, ask your health care provider about preconception counseling.  If you may become pregnant, take 400 to 800 micrograms (mcg) of folic acid every day.  If you want to prevent pregnancy, talk to your health care provider about birth control (contraception). Osteoporosis and menopause  Osteoporosis is a disease in which the bones lose minerals and strength with aging. This can result in serious bone fractures. Your risk for osteoporosis can be identified using a bone density scan.  If you are 60 years of age or  older, or if you are at risk for osteoporosis and fractures, ask your health care provider if you should be screened.  Ask your health care provider whether you should take a calcium or vitamin D supplement to lower your risk for osteoporosis.  Menopause may have certain physical symptoms and risks.  Hormone replacement therapy may reduce some of these symptoms and risks. Talk to your health care provider about whether hormone replacement therapy is right for you. Follow these instructions at home:  Schedule regular health, dental, and eye exams.  Stay current with your immunizations.  Do not use any tobacco products including cigarettes, chewing tobacco, or electronic cigarettes.  If you are pregnant, do not drink alcohol.  If you are breastfeeding, limit how much and how often you drink alcohol.  Limit alcohol intake to  no more than 1 drink per day for nonpregnant women. One drink equals 12 ounces of beer, 5 ounces of wine, or 1 ounces of hard liquor.  Do not use street drugs.  Do not share needles.  Ask your health care provider for help if you need support or information about quitting drugs.  Tell your health care provider if you often feel depressed.  Tell your health care provider if you have ever been abused or do not feel safe at home. This information is not intended to replace advice given to you by your health care provider. Make sure you discuss any questions you have with your health care provider. Document Released: 10/01/2010 Document Revised: 08/24/2015 Document Reviewed: 12/20/2014 Elsevier Interactive Patient Education  [employer].

## 2017-01-01 LAB — HIV ANTIBODY (ROUTINE TESTING W REFLEX): HIV Screen 4th Generation wRfx: NONREACTIVE

## 2017-01-01 NOTE — Progress Notes (Signed)
I have reviewed this visit and discussed with Lauren Ducatte, RN, BSN, and agree with her documentation.   

## 2017-03-14 ENCOUNTER — Other Ambulatory Visit: Payer: Self-pay

## 2017-03-14 ENCOUNTER — Ambulatory Visit (INDEPENDENT_AMBULATORY_CARE_PROVIDER_SITE_OTHER): Payer: Medicare HMO | Admitting: Family Medicine

## 2017-03-14 VITALS — BP 138/86 | HR 77 | Temp 98.4°F | Ht 61.0 in | Wt 185.0 lb

## 2017-03-14 DIAGNOSIS — R252 Cramp and spasm: Secondary | ICD-10-CM | POA: Diagnosis not present

## 2017-03-14 DIAGNOSIS — C50912 Malignant neoplasm of unspecified site of left female breast: Secondary | ICD-10-CM

## 2017-03-14 DIAGNOSIS — K219 Gastro-esophageal reflux disease without esophagitis: Secondary | ICD-10-CM

## 2017-03-14 DIAGNOSIS — F1010 Alcohol abuse, uncomplicated: Secondary | ICD-10-CM | POA: Diagnosis not present

## 2017-03-14 MED ORDER — PANTOPRAZOLE SODIUM 20 MG PO TBEC: 20.0000 mg | DELAYED_RELEASE_TABLET | Freq: Every day | ORAL | 2 refills | Status: DC

## 2017-03-14 NOTE — Assessment & Plan Note (Signed)
Stable w/ no new complaints, wants refill of chronic pantoprazole

## 2017-03-14 NOTE — Assessment & Plan Note (Signed)
Discussed potential contribution of alcohol to feet cramping and impact on health in general.   Patient declines attempts to quit.

## 2017-03-14 NOTE — Assessment & Plan Note (Signed)
Intermittent, every few days, resolved spontaneously after a few minutes to an hour.  Will check BMP and evaluate for sodium/hydration levels

## 2017-03-14 NOTE — Patient Instructions (Signed)
It was a pleasure to see you today! Thank you for choosing [facility] for your primary care. Carrie [name] was seen for cramping in feet/hands/ribs. Come back to the clinic if this seems to be getting worse, and go to the emergency room if you have any life threatening symptoms.  We think that your hydration and sodium levels may be causing your cramping.   We're going to check your sodium levels here in the clinic and are encouraging you to drink less alcohol and to try to stay hydrated with water instead of tea.  We're also ordering a mammogram because after your breast cancer you are advised to get 1 each year and we are at about 20months since your last.   If you don't get a call about the mammorgram appt within 1 wk please call and followup.    If we did any lab work today, and the results require attention, either me or my nurse will get in touch with you. If everything is normal, you will get a letter in mail and a message via . If you don't hear from [place] in two weeks, please give [place] a call. Otherwise, we look forward to seeing you again at your next visit. If you have any questions or concerns before then, please call the clinic at [phone].  Please bring all your medications to every doctors visit  Sign up for My Chart to have easy access to your labs results, and communication with your Primary care physician.    Please check-out at the front desk before leaving the clinic.    Best,  Dr. Sherene Sires FAMILY MEDICINE RESIDENT - PGY1 03/14/2017 10:51 AM

## 2017-03-14 NOTE — Progress Notes (Signed)
    Subjective:  Carrie [name] is a 66 y.o. female who presents to the [facility] today with a chief complaint of cramping in feet.   HPI: She says she has been getting cramping for a few months on and off.   It might be days or a week between episodes that last a few minutes to an hour.   There is no known specific aggravating/alleviating factor.   It is 10/10 uncomfortable withn happening, is bilateral and has happened in multiple muscle groups (hands/ribs/thighs/feet).   She has taken nothing specifically for this, has no associated muscle weakness, no visual changes, SOB, chest pain, headaches, loss of balance, sensation changes.  Last cramp was a few days ago.  She drinks primarily liquor and tea, is not physically active, does not think she eats much salt  She also complains of a small rash under Left breast  Objective:  Physical Exam: BP 138/86   Pulse 77   Temp 98.4 F (36.9 C) (Oral)   Ht 5\' 1"  (1.549 m)   Wt 185 lb (83.9 kg)   SpO2 98%   BMI 34.96 kg/m   Gen: NAD, resting comfortably CV: RRR with no murmurs appreciated Pulm: CTAB with no crackles, wheezes, or rhonchi GI: Soft, Nontender, Nondistended. MSK: no edema, cyanosis, or clubbing noted.  Normal muscle strenght in limbs bilaterally Skin: Rash under left breast appears to be patch of acne with ~4 small blackheads, no erythema/drainage/masses noted Neuro: grossly normal, moves all extremities Psych: Normal affect and thought content  No results found for this or any previous visit (from the past 72 hour(s)).   Assessment/Plan:  GERD (gastroesophageal reflux disease) Stable w/ no new complaints, wants refill of chronic pantoprazole  Cramping of feet Intermittent, every few days, resolved spontaneously after a few minutes to an hour.  Will check BMP and evaluate for sodium/hydration levels  Alcohol abuse Discussed potential contribution of alcohol to feet cramping and impact on health in general.   Patient declines  attempts to quit.  *overdue for breast mammogram 2/2 to breast cancer w/ lumpectomy, patient agrees to get mammogram w/ [place], [place] - PGY1 03/14/2017 11:00 AM

## 2017-03-15 LAB — BASIC METABOLIC PANEL
BUN/Creatinine Ratio: 7 — ABNORMAL LOW (ref 12–28)
BUN: 5 mg/dL — ABNORMAL LOW (ref 8–27)
CALCIUM: 9.2 mg/dL (ref 8.7–10.3)
CO2: 27 mmol/L (ref 20–29)
CREATININE: 0.73 mg/dL (ref 0.57–1.00)
Chloride: 96 mmol/L (ref 96–106)
GFR calc Af Amer: 101 mL/min/{1.73_m2} (ref >59)
GFR calc non Af Amer: 87 mL/min/{1.73_m2} (ref >59)
GLUCOSE: 95 mg/dL (ref 65–99)
POTASSIUM: 3.5 mmol/L (ref 3.5–5.2)
SODIUM: 139 mmol/L (ref 134–144)

## 2017-04-11 ENCOUNTER — Ambulatory Visit: Payer: Medicare HMO

## 2017-04-17 ENCOUNTER — Other Ambulatory Visit: Payer: Self-pay

## 2017-04-17 ENCOUNTER — Encounter: Payer: Self-pay | Admitting: Family Medicine

## 2017-04-17 ENCOUNTER — Ambulatory Visit (INDEPENDENT_AMBULATORY_CARE_PROVIDER_SITE_OTHER): Payer: Medicare HMO | Admitting: Family Medicine

## 2017-04-17 VITALS — BP 112/62 | HR 83 | Temp 97.7°F | Wt 183.0 lb

## 2017-04-17 DIAGNOSIS — G629 Polyneuropathy, unspecified: Secondary | ICD-10-CM

## 2017-04-17 DIAGNOSIS — F1010 Alcohol abuse, uncomplicated: Secondary | ICD-10-CM | POA: Diagnosis not present

## 2017-04-17 DIAGNOSIS — C50912 Malignant neoplasm of unspecified site of left female breast: Secondary | ICD-10-CM

## 2017-04-17 MED ORDER — GABAPENTIN 100 MG PO CAPS: 100.0000 mg | ORAL_CAPSULE | Freq: Three times a day (TID) | ORAL | 0 refills | Status: DC

## 2017-04-17 NOTE — Assessment & Plan Note (Signed)
For the past 2 months Carrie [name] has had intermittent "tingling" in her arms/legs/torso.   She specifically denies that this is painful but states that is distressing and annoying.   She denies any connection with activity level as it happens when sleeping/sitting/walking/etc.  It last for about 2-3 minutes at a time and then will stop without any particular trigger.   It will usually stay gone for a number of hours and then restart without warning or prodrome.  She describes it as the feeling that you get when your "foot falls asleep".   She also specifically denies any loss of sensation, strength, motor control etc.  Plan is to offer gabapentin and draw vitamin labs while encouraging continued decrease in alcohol intake

## 2017-04-17 NOTE — Progress Notes (Signed)
    Subjective:  Carrie [name] is a 67 y.o. female who presents to the [facility] today with a chief complaint of neuropathy.   HPI: For the past 2 months Ms. Carrie [name] has had intermittent "tingling" in her arms/legs/torso.   She specifically denies that this is painful but states that is distressing and annoying.   She denies any connection with activity level as it happens when sleeping/sitting/walking/etc.  It last for about 2-3 minutes at a time and then will stop without any particular trigger.   It will usually stay gone for a number of hours and then restart without warning or prodrome.  She describes it as the feeling that you get when your "foot falls asleep".   She also specifically denies any loss of sensation, strength, motor control etc.  She is still drinking heavily (1/5th of liquor per 3 days) but that is a decrease from prior and she is convinced that it is not contributory.   She takes a multivitamin but is admittedly inconsistent.  Patient is not diabetic  Objective:  Physical Exam: BP 112/62   Pulse 83   Temp 97.7 F (36.5 C) (Oral)   Wt 183 lb (83 kg)   SpO2 95%   BMI 34.58 kg/m   Gen: NAD, resting comfortably CV: RRR with no murmurs appreciated Pulm: NWOB, CTAB with no crackles, wheezes, or rhonchi GI: Normal bowel sounds present. Soft, Nontender, Nondistended. MSK: no edema, cyanosis, or clubbing noted Skin: warm, dry Neuro: grossly normal, moves all extremities Psych: Normal affect and thought content  No results found for this or any previous visit (from the past 72 hour(s)).   Assessment/Plan:  Neuropathy For the past 2 months Ms. Carrie [name] has had intermittent "tingling" in her arms/legs/torso.   She specifically denies that this is painful but states that is distressing and annoying.   She denies any connection with activity level as it happens when sleeping/sitting/walking/etc.  It last for about 2-3 minutes at a time and then will stop without any particular trigger.   It  will usually stay gone for a number of hours and then restart without warning or prodrome.  She describes it as the feeling that you get when your "foot falls asleep".   She also specifically denies any loss of sensation, strength, motor control etc.  Plan is to offer gabapentin and draw vitamin labs while encouraging continued decrease in alcohol intake  Alcohol abuse General health benefits to reducing drinking were discussed along with potential impact of alcohol abuse on neuropathy  Patient agrees to try and slow down alcohol intake  Breast cancer, left breast [facility]) Patient encouraged again to go get screening mamomgram done as it is already ordered from prior visit   Sherene Sires, [place] - PGY1 04/17/2017 2:23 PM

## 2017-04-17 NOTE — Assessment & Plan Note (Addendum)
General health benefits to reducing drinking were discussed along with potential impact of alcohol abuse on neuropathy  Patient agrees to try and slow down alcohol intake

## 2017-04-17 NOTE — Patient Instructions (Signed)
It was a pleasure to see you today! Thank you for choosing [facility] for your primary care. Carrie [name] was seen for neuropathy. Come back to the clinic if you have worsening symptoms, and go to the emergency room if you have any trouble breathing, chest pain or life threatening concerns.  Today we discusses your intermittent neuropathy and are going to offer some gabapentin to help ease the discomfort and order some labs to help [place] identify potential causes.   Please also continue your multivitamin and decreasing your alcohol intake.    It is also important to get the overdue screening mammogram we ordered for you  If we did any lab work today, and the results require attention, either me or my nurse will get in touch with you. If everything is normal, you will get a letter in mail and a message via . If you don't hear from [place] in two weeks, please give [place] a call. Otherwise, we look forward to seeing you again at your next visit. If you have any questions or concerns before then, please call the clinic at [phone].  Please bring all your medications to every doctors visit  Sign up for My Chart to have easy access to your labs results, and communication with your Primary care physician.    Please check-out at the front desk before leaving the clinic.    Best,  Dr. Sherene Sires FAMILY MEDICINE RESIDENT - PGY1 04/17/2017 2:09 PM

## 2017-04-17 NOTE — Assessment & Plan Note (Signed)
Patient encouraged again to go get screening mamomgram done as it is already ordered from prior visit

## 2017-04-21 LAB — VITAMIN B12: Vitamin B-12: 347 pg/mL (ref 232–1245)

## 2017-04-21 LAB — FOLATE: Folate: >20 ng/mL (ref >3.0)

## 2017-04-21 LAB — VITAMIN D 25 HYDROXY (VIT D DEFICIENCY, FRACTURES): Vit D, 25-Hydroxy: 17 ng/mL — ABNORMAL LOW (ref 30.0–100.0)

## 2017-04-21 LAB — VITAMIN B1: Thiamine: 53.4 nmol/L — ABNORMAL LOW (ref 66.5–200.0)

## 2017-04-21 LAB — VITAMIN B6

## 2017-04-23 ENCOUNTER — Ambulatory Visit
Admission: RE | Admit: 2017-04-23 | Discharge: 2017-04-23 | Disposition: A | Discharge status: Home / Self Care | Payer: Medicare HMO | Source: Ambulatory Visit | Attending: Family Medicine | Admitting: Family Medicine

## 2017-04-23 DIAGNOSIS — R922 Inconclusive mammogram: Secondary | ICD-10-CM | POA: Diagnosis not present

## 2017-04-23 DIAGNOSIS — C50912 Malignant neoplasm of unspecified site of left female breast: Secondary | ICD-10-CM

## 2017-04-23 HISTORY — DX: Personal history of irradiation: Z92.3

## 2017-04-23 HISTORY — DX: Malignant neoplasm of unspecified site of unspecified female breast: C50.919

## 2017-05-12 ENCOUNTER — Ambulatory Visit: Payer: Medicare HMO | Admitting: Family Medicine

## 2017-05-14 ENCOUNTER — Ambulatory Visit (INDEPENDENT_AMBULATORY_CARE_PROVIDER_SITE_OTHER): Payer: Medicare HMO | Admitting: Family Medicine

## 2017-05-14 ENCOUNTER — Other Ambulatory Visit: Payer: Self-pay

## 2017-05-14 ENCOUNTER — Other Ambulatory Visit: Payer: Self-pay | Admitting: Family Medicine

## 2017-05-14 ENCOUNTER — Encounter: Payer: Self-pay | Admitting: Family Medicine

## 2017-05-14 VITALS — BP 112/68 | HR 90 | Temp 98.2°F | Wt 181.0 lb

## 2017-05-14 DIAGNOSIS — R1011 Right upper quadrant pain: Secondary | ICD-10-CM

## 2017-05-14 NOTE — Patient Instructions (Signed)
It was a pleasure to see you today! Thank you for choosing [facility] for your primary care. Carrie [name] was seen for abdominal pain. Come back to the clinic if you have any new concerns, and go to the emergency room if your pain gets significantly worse.  We have ordered an ultrasound at the [facility] on [address].   Please do this as soon as possible.   If we did any lab work today, and the results require attention, either me or my nurse will get in touch with you. If everything is normal, you will get a letter in mail and a message via . If you don't hear from [place] in two weeks, please give [place] a call. Otherwise, we look forward to seeing you again at your next visit. If you have any questions or concerns before then, please call the clinic at [phone].  Please bring all your medications to every doctors visit  Sign up for My Chart to have easy access to your labs results, and communication with your Primary care physician.    Please check-out at the front desk before leaving the clinic.    Best,  Dr. Sherene Sires FAMILY MEDICINE RESIDENT - PGY1 05/14/2017 3:29 PM

## 2017-05-14 NOTE — Progress Notes (Signed)
    Subjective:  Carrie [name] is a 67 y.o. female who presents to the [facility] today with a chief complaint of crampy abdominal pain.   HPI: Patient has complained of intermittent R sided pain upper abdominal/under R breast since her last visit.  This has been described as non-radiating with no noted aggravating/alleviating factors and very uncomfortable but tolerable.  Today she is unable to sit still, extremely fidgety and dramatic in presentation and says the pain is unbearable.  It is now located in RUQ abdomen primarily, with some pain in LUQ, minimal in LLQ and none in RLQ.   She has had no emesis/diarrhea.    There are no skin changes claimed by patient   Objective:  Physical Exam: BP 112/68   Pulse 90   Temp 98.2 F (36.8 C) (Oral)   Wt 181 lb (82.1 kg)   SpO2 98%   BMI 34.20 kg/m   Gen: agitated/dramatic presentation and extremely unable to sit still CV: RRR with no murmurs appreciated Pulm: NWOB, CTAB with no crackles, wheezes, or rhonchi GI: Normal bowel sounds present. Soft, Nontender, Nondistended.  But very tender to RUQ, less so to LUQ/LLQ.   No pain to RLQ.   Intermittent palpable  MSK: no edema, cyanosis, or clubbing noted Skin: warm, dry.  No changes over abdomen Neuro: grossly normal, moves all extremities Psych: Normal affect and thought content  No results found for this or any previous visit (from the past 72 hour(s)).   Assessment/Plan:  Abdominal pain, right upper quadrant Concern for cholelithiasis.  Ordered RUQ [place] and patient instructed to go immediately (she was not sure she would go today).  We discussed indications to go to emergency dept is symptoms worsened after hours.   Sherene Sires, DO FAMILY MEDICINE RESIDENT - PGY1 05/16/2017 10:46 AM

## 2017-05-16 DIAGNOSIS — R1011 Right upper quadrant pain: Secondary | ICD-10-CM | POA: Insufficient documentation

## 2017-05-16 NOTE — Assessment & Plan Note (Addendum)
Concern for cholelithiasis.  Ordered RUQ [place] and patient instructed to go immediately (she was not sure she would go today).  We discussed indications to go to emergency dept is symptoms worsened after hours.

## 2017-05-28 ENCOUNTER — Ambulatory Visit
Admission: RE | Admit: 2017-05-28 | Discharge: 2017-05-28 | Disposition: A | Discharge status: Home / Self Care | Payer: Medicare HMO | Source: Ambulatory Visit | Attending: Family Medicine | Admitting: Family Medicine

## 2017-05-28 DIAGNOSIS — K760 Fatty (change of) liver, not elsewhere classified: Secondary | ICD-10-CM | POA: Diagnosis not present

## 2017-05-28 DIAGNOSIS — R1011 Right upper quadrant pain: Secondary | ICD-10-CM

## 2017-05-29 ENCOUNTER — Telehealth: Payer: Self-pay

## 2017-05-29 NOTE — Telephone Encounter (Signed)
Pt informed. Pt would like to know what the next step is. She would like to talk to Dr. Enid Derry today if at all possible. Ottis Stain, CMA

## 2017-05-29 NOTE — Telephone Encounter (Signed)
Dr. Criss Rosales will be back in the office tomorrow. If patient is having emergent problems she should go to the ER or come to office for evaluation, but otherwise I think it may be best if she speaks with her PCP tomorrow.

## 2017-05-29 NOTE — Telephone Encounter (Signed)
-----   Message from [place] Shirley, DO sent at 05/29/2017 12:01 PM EST ----- Please notify patient of normal results.

## 2017-05-30 ENCOUNTER — Encounter: Payer: Self-pay | Admitting: Family Medicine

## 2017-06-03 ENCOUNTER — Ambulatory Visit (INDEPENDENT_AMBULATORY_CARE_PROVIDER_SITE_OTHER): Payer: Medicare HMO | Admitting: Orthopaedic Surgery

## 2017-06-03 ENCOUNTER — Encounter (INDEPENDENT_AMBULATORY_CARE_PROVIDER_SITE_OTHER): Payer: Self-pay | Admitting: Orthopaedic Surgery

## 2017-06-03 ENCOUNTER — Telehealth: Payer: Self-pay | Admitting: Family Medicine

## 2017-06-03 VITALS — BP 126/81 | HR 80 | Ht 60.0 in | Wt 181.0 lb

## 2017-06-03 DIAGNOSIS — Z96651 Presence of right artificial knee joint: Secondary | ICD-10-CM | POA: Diagnosis not present

## 2017-06-03 DIAGNOSIS — M48062 Spinal stenosis, lumbar region with neurogenic claudication: Secondary | ICD-10-CM

## 2017-06-03 DIAGNOSIS — K760 Fatty (change of) liver, not elsewhere classified: Secondary | ICD-10-CM

## 2017-06-03 DIAGNOSIS — M1712 Unilateral primary osteoarthritis, left knee: Secondary | ICD-10-CM

## 2017-06-03 NOTE — Telephone Encounter (Signed)
Patient called and we discussed recent imaging with statosis of liver.   She will make a lab appt to  Come in for some lab work.  Given results, we may consider elastography to evaluate for fibrosis  Dr. Criss Rosales

## 2017-06-03 NOTE — Telephone Encounter (Signed)
followup labs ordered, called patient and she is going to schedule a lab appt.

## 2017-06-03 NOTE — Progress Notes (Signed)
Office Visit Note   Patient: Carrie [name]           Date of Birth: [birth year]           MRN: [id number] Visit Date: 06/03/2017              Requested by: Sherene Sires, DO [address], [place] [address] PCP: Sherene Sires, DO   Assessment & Plan: Visit Diagnoses:  1. S/P total knee arthroplasty, right   2. Unilateral primary osteoarthritis, left knee   3. Spinal stenosis of lumbar region with neurogenic claudication     Plan: Patient wants to proceed with left total knee arthroplasty in July.  Check to see if she can have her sister stay with her so she does not have to go to skilled facility as she did last time.  We will defer lumbar workup and I discussed with her she likely would need a myelogram CT scan for evaluation since she has some dynamic instability with stenosis.  Previous right total knee arthroplast did well with spinal anesthesia.   questions were elicited and answered.  She requests we proceed with left total knee arthroplasty.  Follow-Up Instructions: No Follow-up on file.   Orders:  No orders of the defined types were placed in this encounter.  No orders of the defined types were placed in this encounter.     Procedures: No procedures performed   Clinical Data: No additional findings.   Subjective: Chief Complaint  Patient presents with  . Lower Back - Follow-up, Pain  . Right Knee - Follow-up    HPI patient returns post right total knee arthroplasty 10/23/2016 she has good flexion extension occasionally she has some mild discomfort.  She states she is having increased problems with her left knee which has osteoarthritis and has failed anti-inflammatories activity modification.  She also has some ongoing problems with her back and previous MRI 2017 showed disc bulge at L3-with facet arthropathy and multifactorial stenosis.  She had small bulge at 4 5 and facet arthropathy with 4 mm anterolisthesis at L5-S1.  States she is only able to walk  25-50 feet before she starts having back problems.  She gets relief with sitting.  She does better when she leans on a grocery cart.  Review of Systems 14  point review of systems positive for problems with eustachian tube dysfunction.  Spinal stenosis with narrowing and slight listhesis L3-4, L4-5 and L5-S1.  Positive for GERD, hypertension, vitamin D deficiency, previous right total knee arthroplasty, left knee osteoarthritis.  Neurogenic claudication symptoms.  Otherwise negative as it pertains HPI.   Objective: Vital Signs: BP 126/81   Pulse 80   Ht 5' (1.524 m)   Wt 181 lb (82.1 kg)   BMI 35.35 kg/m   Physical Exam  Constitutional: She is oriented to person, place, and time. She appears well-developed.  HENT:  Head: Normocephalic.  Right Ear: External ear normal.  Left Ear: External ear normal.  Eyes: Pupils are equal, round, and reactive to light.  Neck: No tracheal deviation present. No thyromegaly present.  Cardiovascular: Normal rate.  Pulmonary/Chest: Effort normal.  Abdominal: Soft.  Neurological: She is alert and oriented to person, place, and time.  Skin: Skin is warm and dry.  Psychiatric: She has a normal mood and affect. Her behavior is normal.    Ortho Exam patient has 2+ knee effusion on the left knee.  Well-healed right total knee arthroplasty.  Left knee reaches full extension she  flexes 110 degrees with severe crepitus.  Collateral ligaments are stable she has mild varus deformity.  Negative straight leg raising 90 degrees anterior tib EHL is intact distal pulses are intact. Specialty Comments:  No specialty comments available.  Imaging: No results found.   PMFS History: Patient Active Problem List   Diagnosis Date Noted  . Abdominal pain, right upper quadrant 05/16/2017  . Neuropathy 04/17/2017  . Cramping of feet 03/14/2017  . Leg swelling 11/20/2016  . Mild tetrahydrocannabinol (THC) abuse 11/20/2016  . Pain in right foot 09/01/2016  . Impingement  syndrome of left shoulder 09/01/2016  . Spinal stenosis of lumbar region with neurogenic claudication 02/14/2016  . Eustachian tube dysfunction 09/04/2015  . Back pain of thoracolumbar region 05/08/2015  . Epidermoid cyst of skin 01/10/2015  . Muscle cramping 12/05/2014  . Low back pain 06/14/2014  . Preventative health care 03/15/2014  . Diabetes mellitus screening 02/22/2014  . Bilateral knee pain 02/08/2014  . Transaminitis 07/23/2013  . Essential hypertension, benign 07/23/2013  . GERD (gastroesophageal reflux disease) 07/23/2013  . Vitamin D deficiency 07/22/2013  . Breast cancer, left breast ([place]) 07/17/2011  . FIBROIDS, UTERUS 01/30/2007  . Alcohol abuse 01/30/2007   Past Medical History:  Diagnosis Date  . Arthritis    "back, right hand" (07/11/2014)  . Breast cancer [facility]) 2011   right breast  . Breast cancer, right breast ([place]) 05/2009  . Chronic lower back pain   . GERD (gastroesophageal reflux disease)   . Hepatic steatosis 07/17/2011  . Hypertension   . Hypokalemia 07/17/2011  . Lower GI bleeding 07/11/2014 hospitalized  . Personal history of radiation therapy 2011    Family History  Problem Relation Age of Onset  . Cancer Other   . Diabetes Mother   . Hypertension Mother   . Diabetes Brother   . Hypertension Father   . Arthritis/Rheumatoid Father   . Cancer Father   . Cancer Maternal Grandfather   . Heart disease Brother     Past Surgical History:  Procedure Laterality Date  . BREAST BIOPSY Right 2011  . BREAST LUMPECTOMY Right 2011  . COLONOSCOPY    . ESOPHAGOGASTRODUODENOSCOPY N/A 07/12/2014   Procedure: ESOPHAGOGASTRODUODENOSCOPY (EGD);  Surgeon: Clarene Essex, MD;  Location[facility] ENDOSCOPY;  Service: Endoscopy;  Laterality: N/A;  . FINGER SURGERY     middle finger on left hand  . MASTECTOMY COMPLETE / SIMPLE W/ SENTINEL NODE BIOPSY  05/2009   Archie Endo 05/23/2009  . TOTAL KNEE ARTHROPLASTY Right 10/23/2016  . TOTAL KNEE ARTHROPLASTY Right 10/23/2016    Procedure: RIGHT TOTAL KNEE ARTHROPLASTY;  Surgeon: [name], MD;  Location: [place];  Service: Orthopedics;  Laterality: Right;   Social History   Occupational History  . Not on file  Tobacco Use  . Smoking status: Never Smoker  . Smokeless tobacco: Never Used  Substance and Sexual Activity  . Alcohol use: Yes    Alcohol/week: 8.4 oz    Types: 10 Glasses of wine, 4 Shots of liquor per week    Comment: daily since her 20s several a day   . Drug use: Yes    Frequency: 7.0 times per week    Types: Marijuana  . Sexual activity: Yes    Birth control/protection: Post-menopausal    Comment: 2 partners in last year without condom use

## 2017-06-03 NOTE — Telephone Encounter (Signed)
Dr. Criss Rosales have you talked to Ms Swier. Please see messages below. Ottis Stain, CMA

## 2017-06-05 ENCOUNTER — Telehealth (INDEPENDENT_AMBULATORY_CARE_PROVIDER_SITE_OTHER): Payer: Self-pay | Admitting: Orthopaedic Surgery

## 2017-06-05 DIAGNOSIS — M4807 Spinal stenosis, lumbosacral region: Secondary | ICD-10-CM

## 2017-06-05 NOTE — Telephone Encounter (Signed)
Back worse. Both legs weak, wants to proceed with lumbar myelo/CT for spinal stenosis and lithesis with neurogenic claudication symptoms . ROV after Myelo/CT scan thanks

## 2017-06-05 NOTE — Telephone Encounter (Signed)
Please advise 

## 2017-06-05 NOTE — Telephone Encounter (Signed)
Patient called stating ever since her appointment this past Tuesday she has been in a lot of pain, she can't walk, and her leg is really stiff. She was wondering if Dr. Lorin Mercy could prescribe her something to help if at all possible. CB # [phone]

## 2017-06-05 NOTE — Telephone Encounter (Signed)
Order entered

## 2017-06-05 NOTE — Addendum Note (Signed)
Addended by: Meyer Cory on: 06/05/2017 02:07 PM   Modules accepted: Orders

## 2017-06-13 ENCOUNTER — Other Ambulatory Visit: Payer: Self-pay | Admitting: Family Medicine

## 2017-06-13 DIAGNOSIS — G629 Polyneuropathy, unspecified: Secondary | ICD-10-CM

## 2017-06-25 ENCOUNTER — Ambulatory Visit
Admission: RE | Admit: 2017-06-25 | Discharge: 2017-06-25 | Disposition: A | Discharge status: Home / Self Care | Payer: Medicare HMO | Source: Ambulatory Visit | Attending: Orthopaedic Surgery | Admitting: Orthopaedic Surgery

## 2017-06-25 VITALS — BP 127/71 | HR 78

## 2017-06-25 DIAGNOSIS — M4807 Spinal stenosis, lumbosacral region: Secondary | ICD-10-CM | POA: Diagnosis not present

## 2017-06-25 DIAGNOSIS — M48062 Spinal stenosis, lumbar region with neurogenic claudication: Secondary | ICD-10-CM

## 2017-06-25 MED ORDER — IOPAMIDOL (ISOVUE-M 200) INJECTION 41%
15.0000 mL | Freq: Once | INTRAMUSCULAR | Status: AC
  Administered 2017-06-25: 15 mL via INTRATHECAL

## 2017-06-25 MED ORDER — DIAZEPAM 5 MG PO TABS
10.0000 mg | ORAL_TABLET | Freq: Once | ORAL | Status: AC
  Administered 2017-06-25: 10 mg via ORAL

## 2017-06-25 MED ORDER — ONDANSETRON HCL 4 MG/2ML IJ SOLN: 4.0000 mg | Freq: Four times a day (QID) | INTRAMUSCULAR | Status: DC | PRN

## 2017-06-25 NOTE — Discharge Instructions (Signed)

## 2017-06-27 ENCOUNTER — Ambulatory Visit (INDEPENDENT_AMBULATORY_CARE_PROVIDER_SITE_OTHER): Payer: Medicare HMO | Admitting: Orthopaedic Surgery

## 2017-06-27 ENCOUNTER — Encounter (INDEPENDENT_AMBULATORY_CARE_PROVIDER_SITE_OTHER): Payer: Self-pay | Admitting: Orthopaedic Surgery

## 2017-06-27 VITALS — BP 124/83 | Ht 60.0 in | Wt 181.0 lb

## 2017-06-27 DIAGNOSIS — M48062 Spinal stenosis, lumbar region with neurogenic claudication: Secondary | ICD-10-CM

## 2017-06-27 DIAGNOSIS — M47816 Spondylosis without myelopathy or radiculopathy, lumbar region: Secondary | ICD-10-CM | POA: Diagnosis not present

## 2017-06-27 NOTE — Progress Notes (Signed)
Office Visit Note   Patient: Carrie [name]           Date of Birth: 1950/09/26           MRN: [id number] Visit Date: 06/27/2017              Requested by: Sherene Sires, DO [address], [place] [address] PCP: Sherene Sires, DO   Assessment & Plan: Visit Diagnoses:  1. Spinal stenosis of lumbar region with neurogenic claudication   2. Facet degeneration of lumbar region     Plan: Patient has moderate to severe stenosis at L3-4.  Worse with standing noted on myelogram lateral.  She also has significant severe degenerative facets at L5-S1 with grade 1 anterolisthesis the changes to grade 2.  We discussed that she has 2 problems.  The bottom level L5-S1 would require instrumented fusion.  She has more severe stenosis at L3-4 and surgical plan would be decompression L3-4 for her spinal stenosis.  She understands that this would leave the severe degenerative facet problem at L5-S1 but her principal problem is not with turning and twisting but with neurogenic claudication symptoms.  We discussed surgery options.  Plan is decompression of the L3-4 level overnight stay.  Questions were elicited and answered.  She has no instability at the L3-4 level and plan is central decompression surgery at that level.  Follow-Up Instructions: No follow-ups on file.   Orders:  No orders of the defined types were placed in this encounter.  No orders of the defined types were placed in this encounter.     Procedures: No procedures performed   Clinical Data: No additional findings.   Subjective: Chief Complaint  Patient presents with  . Lower Back - Follow-up    CT L spine review    HPI   67 year old female returns with ongoing problems with claudication.  She can make it about a half block.  She gets relief with sitting after 10 minutes.  She leans on a grocery cart when she goes to the store.  She has to stop at times from shopping go outside and sit in her car for 10-15 minutes and  then goes back into the store.  She has sisters who have had back surgery 1 of them is with her today is had 3 procedures and disc degeneration runs in her family.  Review of Systems positive for eustachian tube dysfunction, spinal stenosis.  L5-S1 degenerative anterolisthesis.  GERD, hypertension, vitamin D deficiency, previous right total knee arthroplasty doing well.  Left knee osteoarthritis.   Objective: Vital Signs: BP 124/83   Ht 5' (1.524 m)   Wt 181 lb (82.1 kg)   BMI 35.35 kg/m   Physical Exam  Constitutional: She is oriented to person, place, and time. She appears well-developed.  HENT:  Head: Normocephalic.  Right Ear: External ear normal.  Left Ear: External ear normal.  Eyes: Pupils are equal, round, and reactive to light.  Neck: No tracheal deviation present. No thyromegaly present.  Cardiovascular: Normal rate.  Pulmonary/Chest: Effort normal.  Abdominal: Soft.  Neurological: She is alert and oriented to person, place, and time.  Skin: Skin is warm and dry.  Psychiatric: She has a normal mood and affect. Her behavior is normal.    Ortho Exam   patient has tenderness palpation lumbar spine post sciatic notch.  Well-healed right total knee arthroplasty.  2+ effusion left knee.  Negative logroll to the hips.  Negative straight leg raising 90 degrees.  Anterior tib gastrocsoleus EHL is intact.  Palpable distal pulses.  Upper and lower extremity reflexes are symmetrical. Specialty Comments:  No specialty comments available.  Imaging: CLINICAL DATA:  Low back pain and left greater than right lower extremity pain predominantly in the anterior thighs.  EXAM: LUMBAR MYELOGRAM  FLUOROSCOPY TIME:  Radiation Exposure Index (as provided by the fluoroscopic device): 277.59 microGray*m^2  Fluoroscopy Time (in minutes and seconds):  34 seconds  PROCEDURE: After thorough discussion of risks and benefits of the procedure including bleeding, infection, injury to nerves,  blood vessels, adjacent structures as well as headache and CSF leak, written and oral informed consent was obtained. Consent was obtained by Dr. Logan Bores. Time out form was completed.  Patient was positioned prone on the fluoroscopy table. Local anesthesia was provided with 1% lidocaine without epinephrine after prepped and draped in the usual sterile fashion. Puncture was performed at L4-5 using a 5 inch 22-gauge spinal needle via a left interlaminar approach. Using a single pass through the dura, the needle was placed within the thecal sac, with return of clear CSF. 15 mL of Isovue M-200 was injected into the thecal sac, with normal opacification of the nerve roots and cauda equina consistent with free flow within the subarachnoid space.  I personally performed the lumbar puncture and administered the intrathecal contrast. I also personally supervised acquisition of the myelogram images.  TECHNIQUE: Contiguous axial images were obtained through the Lumbar spine after the intrathecal infusion of infusion. Coronal and sagittal reconstructions were obtained of the axial image sets.  COMPARISON:  Lumbar MRI 02/29/2016  FINDINGS: LUMBAR MYELOGRAM FINDINGS:  The lumbar spine is number the same as on the prior MRI, with the lowest fully formed intervertebral disc space designated L5-S1. There are small ribs bilaterally at L1.  There is grade 1 anterolisthesis of L5 on S1 in the prone position which increases to borderline grade 2 anterolisthesis with standing. There is no significant change between flexion or extension. There is trace anterolisthesis of L4 on L5 without change during flexion or extension. No abnormal motion is seen at L3-4. A ventral extradural defect contributes to moderate spinal stenosis at L3-4, more prominent with standing than on prone images. Small ventral extradural defects are present at L4-5 greater than L1-2 and L2-3 without evidence of  significant spinal stenosis. There is evidence of bilateral lateral recess stenosis at L5-S1.  CT LUMBAR MYELOGRAM FINDINGS:  Anterolisthesis of L5 on S1 on this supine CT measures 5 mm, unchanged from the prior MRI. There is also unchanged trace retrolisthesis of L3 on L4. A large L1 inferior endplate Schmorl's node is unchanged. No acute fracture or destructive osseous process is identified.  There is vacuum disc from L3-4 to L5-S1. Disc space narrowing is moderate at L3-4 and mild at L4-5, however this mildly worsens with standing. L5-S1 disc space height is preserved on this supine CT but narrows with standing. Endplate sclerosis is present at L3-4, and there is bulky anterior vertebral spurring at L4-5 greater than L3-4.  The conus medullaris terminates at L1-2. Abdominal aortic atherosclerosis is noted without aneurysm. Moderate bladder distention is partially imaged.  L1-2: Mild disc bulging and moderate right facet spurring without stenosis, unchanged.  L2-3: Minimal disc bulging and mild left facet spurring without stenosis, unchanged.  L3-4: Circumferential disc bulging, ligamentum flavum thickening, and severe right and moderate left facet hypertrophy result in mild spinal stenosis and moderate to severe right and moderate left neural foraminal stenosis, unchanged. Spinal stenosis appears  worse on standing radiographs than on this supine CT. Right greater than left facet joint widening likely corresponds to effusions on the prior MRI.  L4-5: Mild circumferential disc bulging and moderate right and severe left facet hypertrophy result in mild right neural foraminal stenosis without spinal stenosis, unchanged.  L5-S1: Anterolisthesis with disc uncovering, ligamentum flavum thickening, and severe bilateral facet hypertrophy result in mild-to-moderate bilateral lateral recess stenosis which has either progressed or is at least better demonstrated than on the  prior MRI and could affect the S1 nerve roots. Mild right and moderate left neural foraminal stenosis is unchanged. No spinal stenosis.  IMPRESSION: 1. Moderate multifactorial spinal stenosis at L3-4, greater with standing. Moderate to severe right and moderate left neural foraminal stenosis. 2. Mild-to-moderate lateral recess and neural foraminal stenosis at L5-S1 which likely worsens with standing as grade 1 anterolisthesis in the recumbent position increases to borderline grade 2 anterolisthesis with standing. 3. Mild right neural foraminal stenosis at L4-5. 4.  Aortic Atherosclerosis (ICD10-I70.0).   Electronically Signed   By: Logan Bores M.D.   On: 06/25/2017 14:43   PMFS History: Patient Active Problem List   Diagnosis Date Noted  . Abdominal pain, right upper quadrant 05/16/2017  . Neuropathy 04/17/2017  . Cramping of feet 03/14/2017  . Leg swelling 11/20/2016  . Mild tetrahydrocannabinol (THC) abuse 11/20/2016  . Pain in right foot 09/01/2016  . Impingement syndrome of left shoulder 09/01/2016  . Bilateral sensorineural hearing loss 07/18/2016  . Subjective tinnitus of both ears 07/18/2016  . Spinal stenosis of lumbar region with neurogenic claudication 02/14/2016  . Eustachian tube dysfunction 09/04/2015  . Back pain of thoracolumbar region 05/08/2015  . Epidermoid cyst of skin 01/10/2015  . Muscle cramping 12/05/2014  . Low back pain 06/14/2014  . Preventative health care 03/15/2014  . Diabetes mellitus screening 02/22/2014  . Bilateral knee pain 02/08/2014  . Transaminitis 07/23/2013  . Essential hypertension, benign 07/23/2013  . GERD (gastroesophageal reflux disease) 07/23/2013  . Vitamin D deficiency 07/22/2013  . Breast cancer, left breast ([place]) 07/17/2011  . FIBROIDS, UTERUS 01/30/2007  . Alcohol abuse 01/30/2007   Past Medical History:  Diagnosis Date  . Arthritis    "back, right hand" (07/11/2014)  . Breast cancer [facility]) 2011   right breast    . Breast cancer, right breast ([place]) 05/2009  . Chronic lower back pain   . GERD (gastroesophageal reflux disease)   . Hepatic steatosis 07/17/2011  . Hypertension   . Hypokalemia 07/17/2011  . Lower GI bleeding 07/11/2014 hospitalized  . Personal history of radiation therapy 2011    Family History  Problem Relation Age of Onset  . Cancer Other   . Diabetes Mother   . Hypertension Mother   . Diabetes Brother   . Hypertension Father   . Arthritis/Rheumatoid Father   . Cancer Father   . Cancer Maternal Grandfather   . Heart disease Brother     Past Surgical History:  Procedure Laterality Date  . BREAST BIOPSY Right 2011  . BREAST LUMPECTOMY Right 2011  . COLONOSCOPY    . ESOPHAGOGASTRODUODENOSCOPY N/A 07/12/2014   Procedure: ESOPHAGOGASTRODUODENOSCOPY (EGD);  Surgeon: Clarene Essex, MD;  Location: [facility] ENDOSCOPY;  Service: Endoscopy;  Laterality: N/A;  . FINGER SURGERY     middle finger on left hand  . MASTECTOMY COMPLETE / SIMPLE W/ SENTINEL NODE BIOPSY  05/2009   [facility] 05/23/2009  . TOTAL KNEE ARTHROPLASTY Right 10/23/2016  . TOTAL KNEE ARTHROPLASTY Right 10/23/2016   Procedure: RIGHT  TOTAL KNEE ARTHROPLASTY;  Surgeon: [name], MD;  Location: [facility];  Service: Orthopedics;  Laterality: Right;   Social History   Occupational History  . Not on file  Tobacco Use  . Smoking status: Never Smoker  . Smokeless tobacco: Never Used  Substance and Sexual Activity  . Alcohol use: Yes    Alcohol/week: 8.4 oz    Types: 10 Glasses of wine, 4 Shots of liquor per week    Comment: daily since her 20s several a day   . Drug use: Yes    Frequency: 7.0 times per week    Types: Marijuana  . Sexual activity: Yes    Birth control/protection: Post-menopausal    Comment: 2 partners in last year without condom use

## 2017-06-30 ENCOUNTER — Telehealth: Payer: Self-pay | Admitting: Family Medicine

## 2017-06-30 DIAGNOSIS — M25561 Pain in right knee: Principal | ICD-10-CM

## 2017-06-30 DIAGNOSIS — G8929 Other chronic pain: Secondary | ICD-10-CM

## 2017-06-30 DIAGNOSIS — M25562 Pain in left knee: Principal | ICD-10-CM

## 2017-06-30 NOTE — Telephone Encounter (Signed)
Received medical/cardiac clearance request from Dr. Lorin Mercy ([facility]) for a L knee arthroplasty.  My medical review of Carrie [name] indicates she is an ASA class 2.  I have placed a referral to cardiology for their portion of the clearance request.  -Dr. Criss Rosales

## 2017-07-01 ENCOUNTER — Ambulatory Visit (INDEPENDENT_AMBULATORY_CARE_PROVIDER_SITE_OTHER): Payer: Medicare HMO | Admitting: Orthopaedic Surgery

## 2017-07-01 ENCOUNTER — Telehealth: Payer: Self-pay

## 2017-07-01 NOTE — Telephone Encounter (Signed)
Called patient and explained their back surgeon was requiring a cardiology clearance for surgery  -Dr. Criss Rosales

## 2017-07-01 NOTE — Telephone Encounter (Signed)
Patient calling asking for PCP to return call to explain why she has been referred to cardiology. I told her it is in regard to her upcoming surgery. She states she is having back surgery and had knee surgery a few weeks ago and did not need to see a heart doctor then.  Please call her at [phone]. Danley Danker, RN [facility] RN)

## 2017-07-14 IMAGING — CR DG CHEST 2V
2 series · 2 of 2 positions shown · non-contrast
Comparison: 01/31/2015

CLINICAL DATA: Pre op for a right knee replacement No recent chest
complaints Hx of HTN- on meds, ex-smoker

EXAM:
CHEST  2 VIEW

[w chest pa]
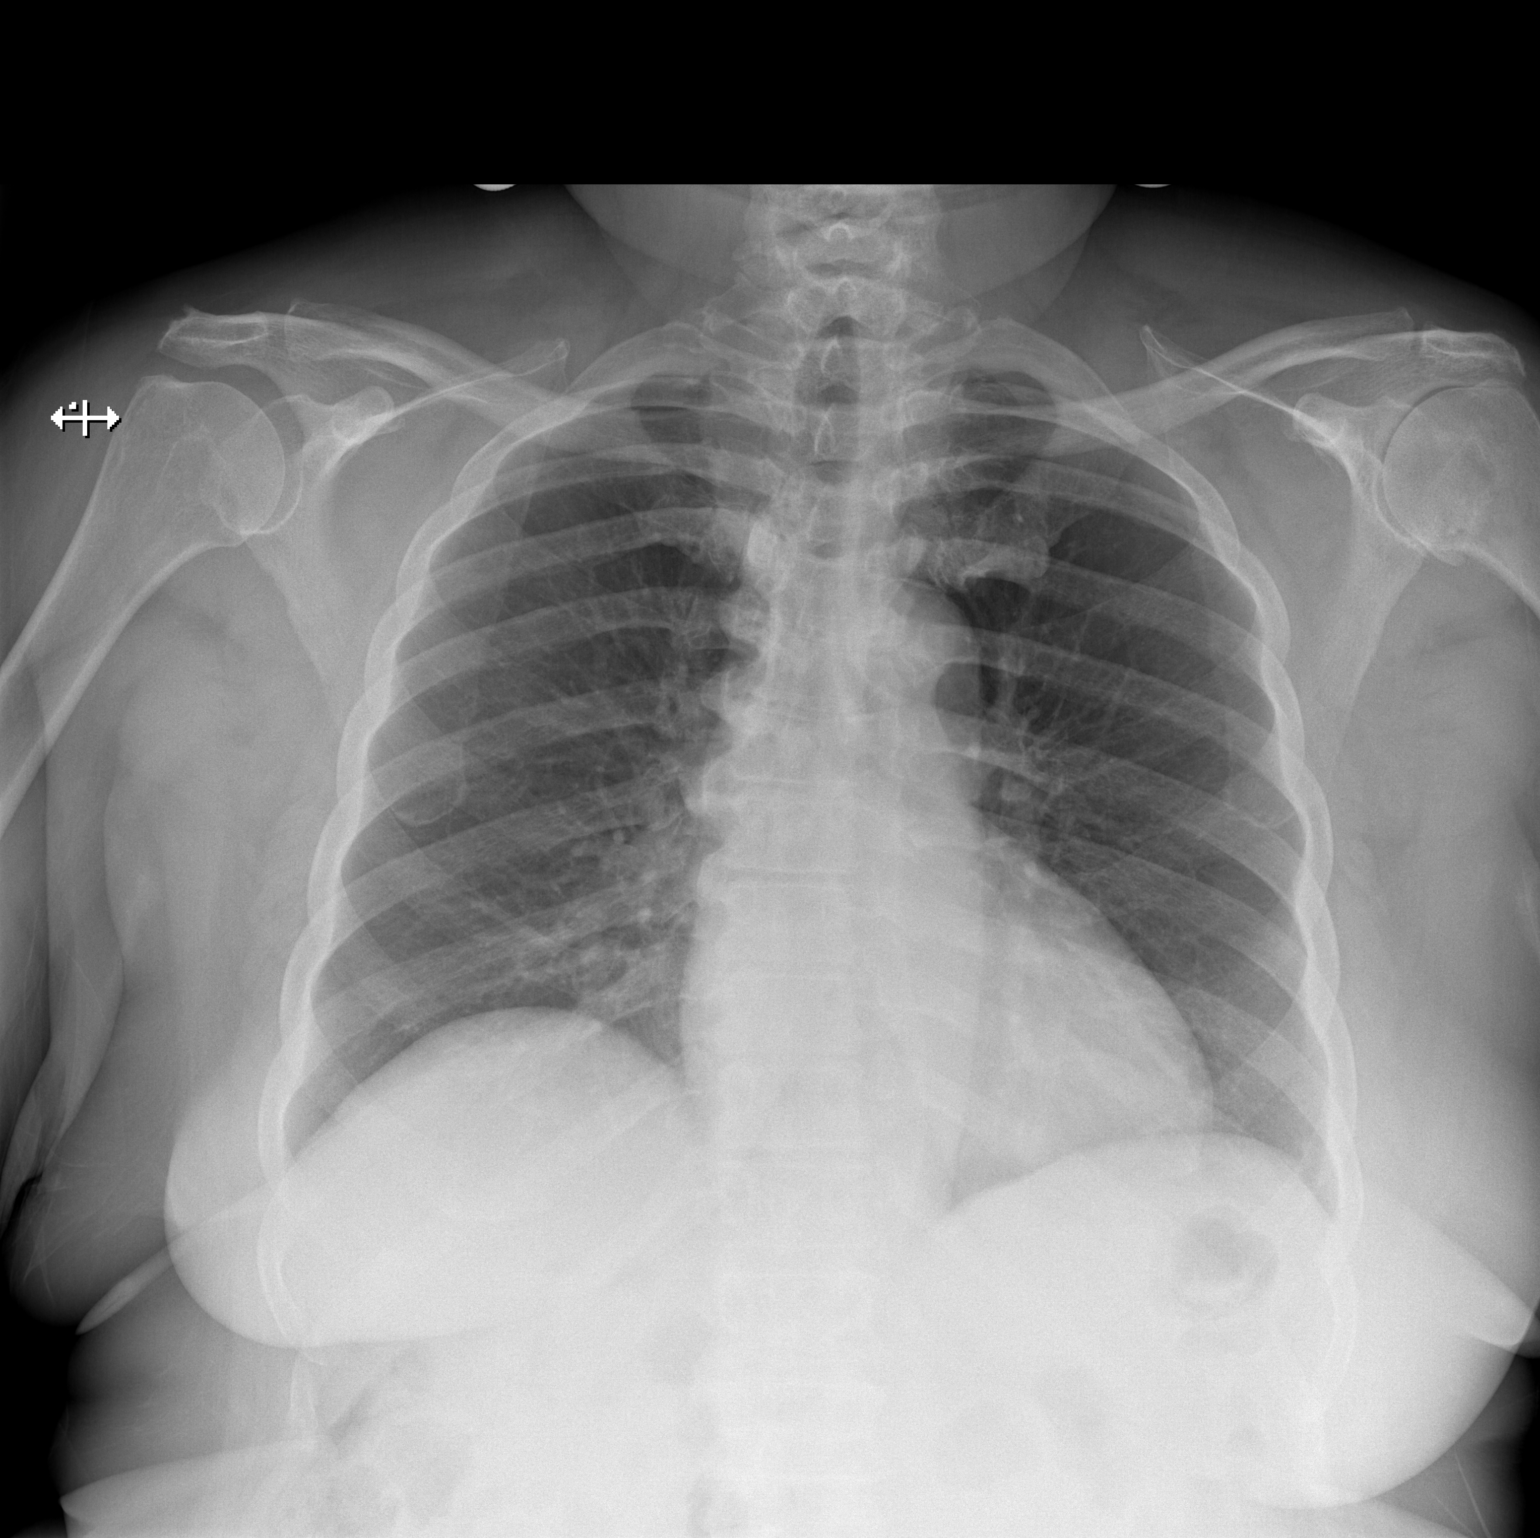

[w chest lat]
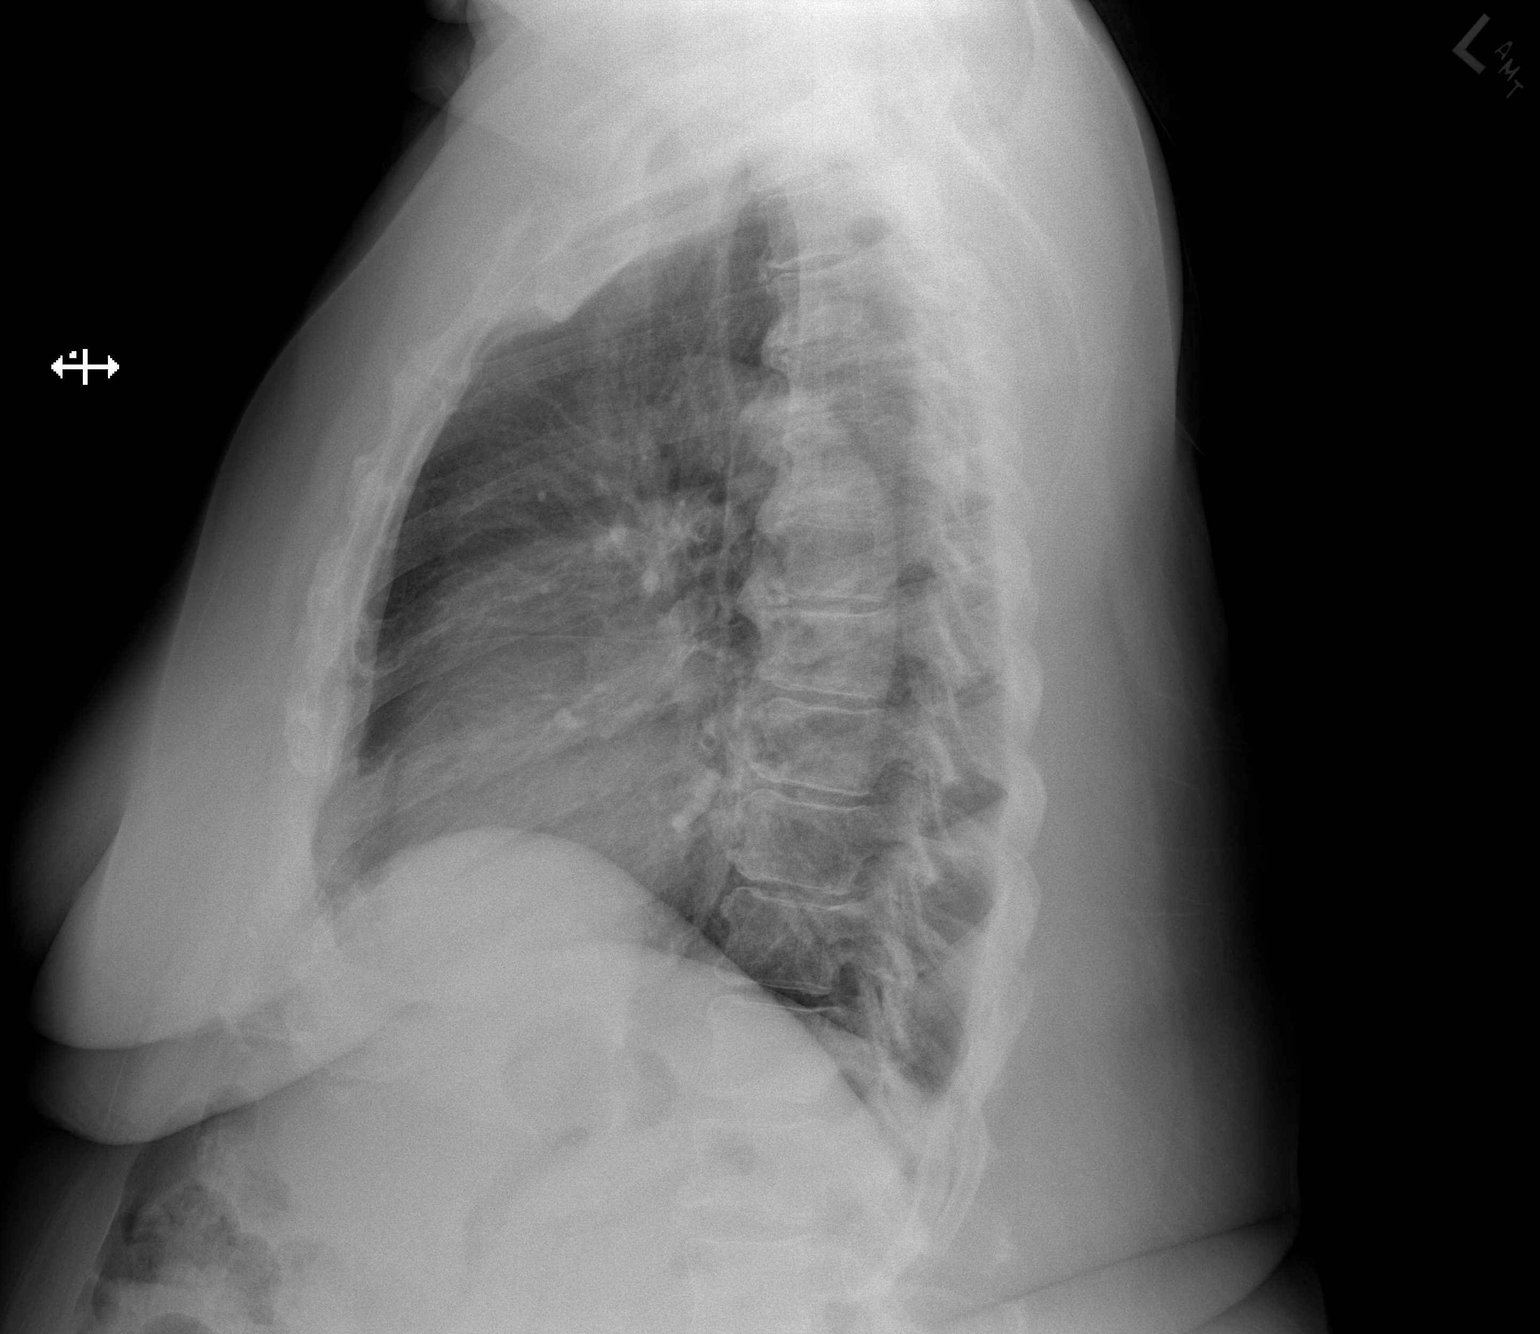

[2 of 2 positions shown; findings below may reference images not displayed]

FINDINGS: Heart size is normal. Lungs are free of focal consolidations and
pleural effusions. No pulmonary edema. Degenerative changes are seen
in thoracic spine.
IMPRESSION: No evidence for acute cardiopulmonary abnormality.

## 2017-07-16 ENCOUNTER — Telehealth: Payer: Self-pay

## 2017-07-16 DIAGNOSIS — R252 Cramp and spasm: Secondary | ICD-10-CM

## 2017-07-16 NOTE — Telephone Encounter (Signed)
Pt calling requesting a Rx for her back spasms. [place] Her call back [phone] Wallace Cullens, RN

## 2017-07-17 NOTE — Telephone Encounter (Signed)
I have never examined patient so am unable to prescribe medication for back spasms over the phone without doing physical exam. Patient should either follow up in [facility] clinic to see another resident, wait and call back when her PCP Dr. Criss Rosales returns from vacation, or contact her orthopedic surgeon to obtain prescription.   Dalphine Handing, PGY-1 [facility] 07/17/2017 1:45 PM

## 2017-07-21 MED ORDER — BACLOFEN 10 MG PO TABS: 5.0000 mg | ORAL_TABLET | Freq: Two times a day (BID) | ORAL | 0 refills | Status: DC

## 2017-07-21 NOTE — Telephone Encounter (Signed)
Patient with degenerative back disease confirmed by ortho, calling asking for something for back spasms.   Will try low dose baclofen and reassess for improvement.  -Dr. Criss Rosales

## 2017-08-20 ENCOUNTER — Encounter

## 2017-08-20 ENCOUNTER — Ambulatory Visit: Payer: Medicare HMO | Admitting: Cardiology

## 2017-08-21 ENCOUNTER — Encounter: Payer: Self-pay | Admitting: Cardiology

## 2017-10-14 ENCOUNTER — Other Ambulatory Visit: Payer: Self-pay

## 2017-10-14 ENCOUNTER — Ambulatory Visit (INDEPENDENT_AMBULATORY_CARE_PROVIDER_SITE_OTHER): Payer: Medicare HMO | Admitting: Family Medicine

## 2017-10-14 ENCOUNTER — Encounter: Payer: Self-pay | Admitting: Family Medicine

## 2017-10-14 VITALS — BP 130/72 | HR 84 | Temp 97.8°F | Ht 60.0 in | Wt 180.4 lb

## 2017-10-14 DIAGNOSIS — R252 Cramp and spasm: Secondary | ICD-10-CM

## 2017-10-14 DIAGNOSIS — R200 Anesthesia of skin: Secondary | ICD-10-CM | POA: Diagnosis not present

## 2017-10-14 DIAGNOSIS — R202 Paresthesia of skin: Secondary | ICD-10-CM

## 2017-10-14 MED ORDER — BACLOFEN 10 MG PO TABS: 5.0000 mg | ORAL_TABLET | Freq: Two times a day (BID) | ORAL | 0 refills | Status: DC

## 2017-10-14 NOTE — Assessment & Plan Note (Signed)
Chronic, stopped baclofen recently.  Doesn't particularly identify new cramping/spasm but does see timing of tingling in toes as consistent with stopping baclofen (maybe muscular impingement?) and is willing to try baclofen again for a month or so and check for resolution.

## 2017-10-14 NOTE — Progress Notes (Signed)
    Subjective:  Carrie [name] is a 67 y.o. female who presents to the [facility] today with a chief complaint of tingling in toes.   HPI: Chronic, stopped baclofen recently.  Doesn't particularly identify new cramping/spasm but does see timing of tingling in toes as consistent with stopping baclofen.  No change in activity or shoes.  No trauma.  No substance/med changes other than stopping baclofen.  No incontinence/paresthesia/paralysis.  Objective:  Physical Exam: BP 130/72   Pulse 84   Temp 97.8 F (36.6 C) (Oral)   Ht 5' (1.524 m)   Wt 180 lb 6.4 oz (81.8 kg)   SpO2 99%   BMI 35.23 kg/m   Gen: NAD, resting comfortably CV: RRR with 2/6 murmurs appreciated Pulm: NWOB, CTAB with no crackles, wheezes, or rhonchi GI: Normal bowel sounds present. Soft, Nontender, Nondistended. MSK: no edema, cyanosis, or clubbing noted.  No lesions/deficits/abnormalities to feet on exam.  Claimed "dulled" senses and pins/needles at distal toes bilaterally.  Cap refill ~2 Skin: warm, dry Neuro: grossly normal, moves all extremities Psych: Normal affect and thought content  No results found for this or any previous visit (from the past 72 hour(s)).   Assessment/Plan:  Numbness and tingling of foot Patient with chronic back pain/spasm.  Now with (not numbness/paresthesia) tingling in her toes bilaterally.  Denies weakness/paralysis/injury/significant activity change or trauma.  Offered gabapentin but it makes her drowsy already at 100mg .  Has not been using baclofen because she had been doing well prior to quitting recently, is willing to restart as see if it helps symptoms.  Muscle cramping Chronic, stopped baclofen recently.  Doesn't particularly identify new cramping/spasm but does see timing of tingling in toes as consistent with stopping baclofen (maybe muscular impingement?) and is willing to try baclofen again for a month or so and check for resolution.   Sherene Sires, [place] -  PGY2 10/14/2017 2:58 PM

## 2017-10-14 NOTE — Patient Instructions (Addendum)
It was a pleasure to see you today! Thank you for choosing [facility] for your primary care. Carrie [name] was seen for tingling in her toes. Come back to the clinic if you have worsening symptoms, and go to the emergency room if you have any life threatening concerns.   Today we refilled your baclofen to see if muscle spasms might have been contributing to your symptoms because the gabapentin makes you drowsy.  Come back to see [place] in two months if the symptoms haven't resolved and we can talk about physical therapy since you aren't quite wanting that yet.  If we did any lab work today that did not result today, one of two things will happen.  1. If everything is normal, you will get a letter in mail sent to the address in your chart with the results for your records.  It is important to keep your address up to date as that is where we will send results.  2. If the results require some sort of discussion, my nurses or myself will call you on the phone number listed in your records.  It is important to keep your phone number up to date in our system as this is how we will try to reach you.  If we cannot reach you on the phone, we will try to send you a letter in the mail so please enable to voicemail function of your phone.  If you don't hear from [place] in two weeks, please give [place] a call to verify your results. Otherwise, we look forward to seeing you again at your next visit. If you have any questions or concerns before then, please call the clinic at [phone].   Please bring all your medications to every doctors visit   Sign up for My Chart to have easy access to your labs results, and communication with your Primary care physician.     Please check-out at the front desk before leaving the clinic.     Best,  Dr. Sherene Sires FAMILY MEDICINE RESIDENT - PGY2 10/14/2017 2:51 PM

## 2017-10-14 NOTE — Assessment & Plan Note (Signed)
Patient with chronic back pain/spasm.  Now with (not numbness/paresthesia) tingling in her toes bilaterally.  Denies weakness/paralysis/injury/significant activity change or trauma.  Offered gabapentin but it makes her drowsy already at 100mg .  Has not been using baclofen because she had been doing well prior to quitting recently, is willing to restart as see if it helps symptoms.

## 2017-11-25 ENCOUNTER — Telehealth: Payer: Self-pay

## 2017-11-25 DIAGNOSIS — I10 Essential (primary) hypertension: Secondary | ICD-10-CM

## 2017-11-25 MED ORDER — AMLODIPINE BESYLATE 10 MG PO TABS: 10.0000 mg | ORAL_TABLET | Freq: Every day | ORAL | 1 refills | Status: DC

## 2017-11-25 NOTE — Telephone Encounter (Signed)
Pt called nurse line requesting refill of amlodipine, states she has been out for over a week. Rx sent to [facility] per standing order. Pt aware. Wallace Cullens, RN

## 2017-11-28 ENCOUNTER — Ambulatory Visit: Payer: Medicare HMO

## 2017-12-03 ENCOUNTER — Ambulatory Visit: Payer: Medicare HMO | Admitting: Family Medicine

## 2017-12-10 ENCOUNTER — Ambulatory Visit: Payer: Medicare HMO | Admitting: Family Medicine

## 2017-12-17 ENCOUNTER — Encounter: Payer: Self-pay | Admitting: Family Medicine

## 2017-12-17 ENCOUNTER — Ambulatory Visit (INDEPENDENT_AMBULATORY_CARE_PROVIDER_SITE_OTHER): Payer: Medicare HMO | Admitting: Family Medicine

## 2017-12-17 ENCOUNTER — Other Ambulatory Visit: Payer: Self-pay

## 2017-12-17 DIAGNOSIS — G629 Polyneuropathy, unspecified: Secondary | ICD-10-CM

## 2017-12-17 DIAGNOSIS — R252 Cramp and spasm: Secondary | ICD-10-CM

## 2017-12-17 MED ORDER — GABAPENTIN 100 MG PO CAPS: 100.0000 mg | ORAL_CAPSULE | Freq: Three times a day (TID) | ORAL | 0 refills | Status: DC

## 2017-12-17 MED ORDER — BACLOFEN 10 MG PO TABS: 10.0000 mg | ORAL_TABLET | Freq: Three times a day (TID) | ORAL | 0 refills | Status: DC

## 2017-12-22 NOTE — Patient Instructions (Signed)
Patient declined AVS as she was in a hurry

## 2017-12-22 NOTE — Assessment & Plan Note (Signed)
Patient said cramping/spasm is worse since she stopped baclofen. She had stopped because it didn't completely resolve symptoms.  We discussed trying a higher dose since it did seem to help somewhat and she would like to do that

## 2017-12-22 NOTE — Assessment & Plan Note (Signed)
She has some sleepiness with the gabapentin but does not drive and the neuropathy interferes with sleep at night.  Will offer higher dose to see how it impacts her pain/sleepiness as her main priority is to avoid surgery.

## 2017-12-22 NOTE — Progress Notes (Signed)
    Subjective:  Carrie [name] is a 67 y.o. female who presents to the [facility] today with a chief complaint of back/lag pain and muscles spasms.   HPI: Patient with chronic pain/spasms and imaging confirming impingement.  She has been evaluated for surgery by neurosurgery but she has a sister with poor outcomes from a similar surgery and wants to do everything she can to avoid it.  She would like to medicinally manage symptoms as long as possible.  Objective:  Physical Exam: BP 122/80 (BP Location: Left Arm)   Pulse 74   Temp 98.1 F (36.7 C) (Oral)   Wt 181 lb 6.4 oz (82.3 kg)   SpO2 97%   BMI 35.43 kg/m   Gen: NAD, resting comfortably CV: RRR with no murmurs appreciated Pulm: NWOB, CTAB with no crackles, wheezes, or rhonchi GI: Normal bowel sounds present. Soft, Nontender, Nondistended. MSK: no edema, cyanosis, or clubbing noted.  Could not physically identifiy a spasm on exam Skin: warm, dry Neuro: grossly normal, moves all extremities Psych: Normal affect and thought content  No results found for this or any previous visit (from the past 72 hour(s)).   Assessment/Plan:  Muscle cramping Patient said cramping/spasm is worse since she stopped baclofen. She had stopped because it didn't completely resolve symptoms.  We discussed trying a higher dose since it did seem to help somewhat and she would like to do that  Neuropathy She has some sleepiness with the gabapentin but does not drive and the neuropathy interferes with sleep at night.  Will offer higher dose to see how it impacts her pain/sleepiness as her main priority is to avoid surgery.   Sherene Sires, DO FAMILY MEDICINE RESIDENT - PGY2 12/22/2017 8:03 AM

## 2017-12-26 ENCOUNTER — Other Ambulatory Visit: Payer: Self-pay

## 2017-12-26 MED ORDER — PANTOPRAZOLE SODIUM 20 MG PO TBEC: 20.0000 mg | DELAYED_RELEASE_TABLET | Freq: Every day | ORAL | 2 refills | Status: DC

## 2018-01-14 ENCOUNTER — Ambulatory Visit (HOSPITAL_COMMUNITY)
Admission: EM | Admit: 2018-01-14 | Discharge: 2018-01-14 | Disposition: A | Discharge status: Home / Self Care | Payer: Medicare HMO | Attending: Family Medicine | Admitting: Family Medicine

## 2018-01-14 ENCOUNTER — Encounter (HOSPITAL_COMMUNITY): Payer: Self-pay | Admitting: Emergency Medicine

## 2018-01-14 DIAGNOSIS — R51 Headache: Secondary | ICD-10-CM | POA: Diagnosis not present

## 2018-01-14 DIAGNOSIS — H1133 Conjunctival hemorrhage, bilateral: Secondary | ICD-10-CM | POA: Diagnosis not present
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
## 2018-01-14 NOTE — Discharge Instructions (Addendum)
Your exam was normal No concern for any brain bleed or concerning issues.  I don't believe you have any broken bones in your face The conjunctiva bleeding will resolve on its own in a few weeks.  If you develop any worsening symptoms to include severe headache, dizziness, blurred vision, weakness then please go to the ER.

## 2018-01-16 ENCOUNTER — Ambulatory Visit (INDEPENDENT_AMBULATORY_CARE_PROVIDER_SITE_OTHER): Payer: Medicare HMO | Admitting: Orthopaedic Surgery

## 2018-02-16 DIAGNOSIS — S0512XA Contusion of eyeball and orbital tissues, left eye, initial encounter: Secondary | ICD-10-CM | POA: Diagnosis not present

## 2018-02-16 DIAGNOSIS — H33312 Horseshoe tear of retina without detachment, left eye: Secondary | ICD-10-CM | POA: Diagnosis not present

## 2018-02-16 DIAGNOSIS — H01021 Squamous blepharitis right upper eyelid: Secondary | ICD-10-CM | POA: Diagnosis not present

## 2018-02-16 DIAGNOSIS — H25812 Combined forms of age-related cataract, left eye: Secondary | ICD-10-CM | POA: Diagnosis not present

## 2018-02-16 DIAGNOSIS — H2511 Age-related nuclear cataract, right eye: Secondary | ICD-10-CM | POA: Diagnosis not present

## 2018-02-16 DIAGNOSIS — H01025 Squamous blepharitis left lower eyelid: Secondary | ICD-10-CM | POA: Diagnosis not present

## 2018-02-16 DIAGNOSIS — H01024 Squamous blepharitis left upper eyelid: Secondary | ICD-10-CM | POA: Diagnosis not present

## 2018-02-16 DIAGNOSIS — H01022 Squamous blepharitis right lower eyelid: Secondary | ICD-10-CM | POA: Diagnosis not present

## 2018-02-17 ENCOUNTER — Encounter: Payer: Medicare HMO | Admitting: Family Medicine

## 2018-02-17 ENCOUNTER — Encounter (INDEPENDENT_AMBULATORY_CARE_PROVIDER_SITE_OTHER): Payer: Self-pay | Admitting: Ophthalmology

## 2018-02-17 ENCOUNTER — Ambulatory Visit (INDEPENDENT_AMBULATORY_CARE_PROVIDER_SITE_OTHER): Payer: Medicare HMO | Admitting: Ophthalmology

## 2018-02-17 DIAGNOSIS — I10 Essential (primary) hypertension: Secondary | ICD-10-CM

## 2018-02-17 DIAGNOSIS — H359 Unspecified retinal disorder: Secondary | ICD-10-CM

## 2018-02-17 DIAGNOSIS — H35033 Hypertensive retinopathy, bilateral: Secondary | ICD-10-CM | POA: Diagnosis not present

## 2018-02-17 DIAGNOSIS — H25813 Combined forms of age-related cataract, bilateral: Secondary | ICD-10-CM | POA: Diagnosis not present

## 2018-02-17 DIAGNOSIS — H04123 Dry eye syndrome of bilateral lacrimal glands: Secondary | ICD-10-CM | POA: Diagnosis not present

## 2018-02-17 DIAGNOSIS — H3581 Retinal edema: Secondary | ICD-10-CM | POA: Diagnosis not present
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
## 2018-02-18 ENCOUNTER — Encounter (INDEPENDENT_AMBULATORY_CARE_PROVIDER_SITE_OTHER): Payer: Self-pay | Admitting: Ophthalmology

## 2018-02-20 ENCOUNTER — Other Ambulatory Visit: Payer: Self-pay | Admitting: Family Medicine

## 2018-02-20 DIAGNOSIS — I10 Essential (primary) hypertension: Secondary | ICD-10-CM

## 2018-02-26 ENCOUNTER — Emergency Department (HOSPITAL_COMMUNITY)
Admission: EM | Admit: 2018-02-26 | Discharge: 2018-02-26 | Disposition: A | Discharge status: Home / Self Care | Payer: Medicare HMO | Attending: Emergency Medicine | Admitting: Emergency Medicine

## 2018-02-26 ENCOUNTER — Other Ambulatory Visit: Payer: Self-pay

## 2018-02-26 ENCOUNTER — Encounter (HOSPITAL_COMMUNITY): Payer: Self-pay | Admitting: Emergency Medicine

## 2018-02-26 DIAGNOSIS — Z96651 Presence of right artificial knee joint: Secondary | ICD-10-CM | POA: Diagnosis not present

## 2018-02-26 DIAGNOSIS — I10 Essential (primary) hypertension: Secondary | ICD-10-CM | POA: Insufficient documentation

## 2018-02-26 DIAGNOSIS — F10929 Alcohol use, unspecified with intoxication, unspecified: Secondary | ICD-10-CM | POA: Diagnosis not present

## 2018-02-26 DIAGNOSIS — Z79899 Other long term (current) drug therapy: Secondary | ICD-10-CM | POA: Insufficient documentation

## 2018-02-26 DIAGNOSIS — F10920 Alcohol use, unspecified with intoxication, uncomplicated: Secondary | ICD-10-CM

## 2018-02-26 DIAGNOSIS — R0902 Hypoxemia: Secondary | ICD-10-CM | POA: Diagnosis not present

## 2018-02-26 DIAGNOSIS — R410 Disorientation, unspecified: Secondary | ICD-10-CM | POA: Diagnosis not present

## 2018-02-26 DIAGNOSIS — F10120 Alcohol abuse with intoxication, uncomplicated: Secondary | ICD-10-CM | POA: Diagnosis not present

## 2018-02-26 DIAGNOSIS — R404 Transient alteration of awareness: Secondary | ICD-10-CM | POA: Diagnosis not present

## 2018-02-26 DIAGNOSIS — H43393 Other vitreous opacities, bilateral: Secondary | ICD-10-CM | POA: Diagnosis not present

## 2018-02-26 DIAGNOSIS — W19XXXA Unspecified fall, initial encounter: Secondary | ICD-10-CM | POA: Diagnosis not present

## 2018-02-26 NOTE — ED Triage Notes (Signed)
Pt to ED via [facility] after reported falling outside.  Pt admits to "a lot" of vodka today.

## 2018-02-26 NOTE — Discharge Instructions (Addendum)
Follow-up with ophthalmology.  The contact information for Dr. Benna Dunks office has been provided in this discharge summary for you to call and make these arrangements,

## 2018-02-26 NOTE — ED Notes (Signed)
Patient verbalizes understanding of discharge instructions. Opportunity for questioning and answers were provided. Armband removed by staff, pt discharged from ED.  
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
## 2018-03-05 ENCOUNTER — Other Ambulatory Visit: Payer: Self-pay | Admitting: Family Medicine

## 2018-03-05 DIAGNOSIS — G629 Polyneuropathy, unspecified: Secondary | ICD-10-CM

## 2018-03-09 ENCOUNTER — Other Ambulatory Visit: Payer: Self-pay | Admitting: Family Medicine

## 2018-03-09 DIAGNOSIS — R252 Cramp and spasm: Secondary | ICD-10-CM

## 2018-03-11 ENCOUNTER — Ambulatory Visit: Payer: Medicare HMO

## 2018-03-16 ENCOUNTER — Ambulatory Visit (INDEPENDENT_AMBULATORY_CARE_PROVIDER_SITE_OTHER): Payer: Medicare HMO

## 2018-03-16 VITALS — BP 160/84 | HR 80 | Temp 98.4°F | Ht 60.0 in | Wt 174.6 lb

## 2018-03-16 DIAGNOSIS — Z23 Encounter for immunization: Secondary | ICD-10-CM | POA: Diagnosis not present

## 2018-03-16 DIAGNOSIS — Z0000 Encounter for general adult medical examination without abnormal findings: Secondary | ICD-10-CM | POA: Diagnosis not present

## 2018-03-16 NOTE — Patient Instructions (Addendum)
Ms. Carrie [name] , Thank you for taking time to come for your Medicare Wellness Visit. I appreciate your ongoing commitment to your health goals. Please review the following plan we discussed and let me know if I can assist you in the future.   Your flu shot was given today. Please keep your appointment with Dr. Criss Rosales on 04/03/18.  These are the goals we discussed: Goals    . Patient Stated     "Stop hurting". Knee pain, and back pain.    . Weight (lb) < 178 lb (80.7 kg)     7% weight loss        This is a list of the screening recommended for you and due dates:  Health Maintenance  Topic Date Due  . Pneumonia vaccines (1 of 2 - PCV13) 03/17/2019*  . Pap Smear  03/16/2019  . Mammogram  04/24/2019  . Colon Cancer Screening  08/28/2022  . Tetanus Vaccine  03/15/2024  . Flu Shot  Completed  . DEXA scan (bone density measurement)  Completed  .  Hepatitis C: One time screening is recommended by Center for Disease Control  (CDC) for  adults born from [birth year] through 1963.   Completed  . HIV Screening  Completed  *Topic was postponed. The date shown is not the original due date.    Health Maintenance, Female Adopting a healthy lifestyle and getting preventive care can go a long way to promote health and wellness. Talk with your health care provider about what schedule of regular examinations is right for you. This is a good chance for you to check in with your provider about disease prevention and staying healthy. In between checkups, there are plenty of things you can do on your own. Experts have done a lot of research about which lifestyle changes and preventive measures are most likely to keep you healthy. Ask your health care provider for more information. Weight and diet Eat a healthy diet  Be sure to include plenty of vegetables, fruits, low-fat dairy products, and lean protein.  Do not eat a lot of foods high in solid fats, added sugars, or salt.  Get regular exercise. This is one of  the most important things you can do for your health. ? Most adults should exercise for at least 150 minutes each week. The exercise should increase your heart rate and make you sweat (moderate-intensity exercise). ? Most adults should also do strengthening exercises at least twice a week. This is in addition to the moderate-intensity exercise.  Maintain a healthy weight  Body mass index (BMI) is a measurement that can be used to identify possible weight problems. It estimates body fat based on height and weight. Your health care provider can help determine your BMI and help you achieve or maintain a healthy weight.  For females 22 years of age and older: ? A BMI below 18.5 is considered underweight. ? A BMI of 18.5 to 24.9 is normal. ? A BMI of 25 to 29.9 is considered overweight. ? A BMI of 30 and above is considered obese.  Watch levels of cholesterol and blood lipids  You should start having your blood tested for lipids and cholesterol at 65 years of age, then have this test every 5 years.  You may need to have your cholesterol levels checked more often if: ? Your lipid or cholesterol levels are high. ? You are older than 65 years of age. ? You are at high risk for heart disease.  Cancer screening  Lung Cancer  Lung cancer screening is recommended for adults 72-30 years old who are at high risk for lung cancer because of a history of smoking.  A yearly low-dose CT scan of the lungs is recommended for people who: ? Currently smoke. ? Have quit within the past 15 years. ? Have at least a 30-pack-year history of smoking. A pack year is smoking an average of one pack of cigarettes a day for 1 year.  Yearly screening should continue until it has been 15 years since you quit.  Yearly screening should stop if you develop a health problem that would prevent you from having lung cancer treatment.  Breast Cancer  Practice breast self-awareness. This means understanding how your  breasts normally appear and feel.  It also means doing regular breast self-exams. Let your health care provider know about any changes, no matter how small.  If you are in your 20s or 30s, you should have a clinical breast exam (CBE) by a health care provider every 1-3 years as part of a regular health exam.  If you are 50 or older, have a CBE every year. Also consider having a breast X-ray (mammogram) every year.  If you have a family history of breast cancer, talk to your health care provider about genetic screening.  If you are at high risk for breast cancer, talk to your health care provider about having an MRI and a mammogram every year.  Breast cancer gene (BRCA) assessment is recommended for women who have family members with BRCA-related cancers. BRCA-related cancers include: ? Breast. ? Ovarian. ? Tubal. ? Peritoneal cancers.  Results of the assessment will determine the need for genetic counseling and BRCA1 and BRCA2 testing.  Cervical Cancer Your health care provider may recommend that you be screened regularly for cancer of the pelvic organs (ovaries, uterus, and vagina). This screening involves a pelvic examination, including checking for microscopic changes to the surface of your cervix (Pap test). You may be encouraged to have this screening done every 3 years, beginning at age 62.  For women ages 46-65, health care providers may recommend pelvic exams and Pap testing every 3 years, or they may recommend the Pap and pelvic exam, combined with testing for human papilloma virus (HPV), every 5 years. Some types of HPV increase your risk of cervical cancer. Testing for HPV may also be done on women of any age with unclear Pap test results.  Other health care providers may not recommend any screening for nonpregnant women who are considered low risk for pelvic cancer and who do not have symptoms. Ask your health care provider if a screening pelvic exam is right for you.  If you  have had past treatment for cervical cancer or a condition that could lead to cancer, you need Pap tests and screening for cancer for at least 20 years after your treatment. If Pap tests have been discontinued, your risk factors (such as having a new sexual partner) need to be reassessed to determine if screening should resume. Some women have medical problems that increase the chance of getting cervical cancer. In these cases, your health care provider may recommend more frequent screening and Pap tests.  Colorectal Cancer  This type of cancer can be detected and often prevented.  Routine colorectal cancer screening usually begins at 65 years of age and continues through 65 years of age.  Your health care provider may recommend screening at an earlier age if you have risk factors for colon  cancer.  Your health care provider may also recommend using home test kits to check for hidden blood in the stool.  A small camera at the end of a tube can be used to examine your colon directly (sigmoidoscopy or colonoscopy). This is done to check for the earliest forms of colorectal cancer.  Routine screening usually begins at age 74.  Direct examination of the colon should be repeated every 5-10 years through 65 years of age. However, you may need to be screened more often if early forms of precancerous polyps or small growths are found.  Skin Cancer  Check your skin from head to toe regularly.  Tell your health care provider about any new moles or changes in moles, especially if there is a change in a mole's shape or color.  Also tell your health care provider if you have a mole that is larger than the size of a pencil eraser.  Always use sunscreen. Apply sunscreen liberally and repeatedly throughout the day.  Protect yourself by wearing long sleeves, pants, a wide-brimmed hat, and sunglasses whenever you are outside.  Heart disease, diabetes, and high blood pressure  High blood pressure causes  heart disease and increases the risk of stroke. High blood pressure is more likely to develop in: ? People who have blood pressure in the high end of the normal range (130-139/85-89 mm Hg). ? People who are overweight or obese. ? People who are African American.  If you are 58-59 years of age, have your blood pressure checked every 3-5 years. If you are 26 years of age or older, have your blood pressure checked every year. You should have your blood pressure measured twice-once when you are at a hospital or clinic, and once when you are not at a hospital or clinic. Record the average of the two measurements. To check your blood pressure when you are not at a hospital or clinic, you can use: ? An automated blood pressure machine at a pharmacy. ? A home blood pressure monitor.  If you are between 58 years and 68 years old, ask your health care provider if you should take aspirin to prevent strokes.  Have regular diabetes screenings. This involves taking a blood sample to check your fasting blood sugar level. ? If you are at a normal weight and have a low risk for diabetes, have this test once every three years after 65 years of age. ? If you are overweight and have a high risk for diabetes, consider being tested at a younger age or more often. Preventing infection Hepatitis B  If you have a higher risk for hepatitis B, you should be screened for this virus. You are considered at high risk for hepatitis B if: ? You were born in a country where hepatitis B is common. Ask your health care provider which countries are considered high risk. ? Your parents were born in a high-risk country, and you have not been immunized against hepatitis B (hepatitis B vaccine). ? You have HIV or AIDS. ? You use needles to inject street drugs. ? You live with someone who has hepatitis B. ? You have had sex with someone who has hepatitis B. ? You get hemodialysis treatment. ? You take certain medicines for  conditions, including cancer, organ transplantation, and autoimmune conditions.  Hepatitis C  Blood testing is recommended for: ? Everyone born from [birth year] through 1963. ? Anyone with known risk factors for hepatitis C.  Sexually transmitted infections (STIs)  You should  be screened for sexually transmitted infections (STIs) including gonorrhea and chlamydia if: ? You are sexually active and are younger than 65 years of age. ? You are older than 65 years of age and your health care provider tells you that you are at risk for this type of infection. ? Your sexual activity has changed since you were last screened and you are at an increased risk for chlamydia or gonorrhea. Ask your health care provider if you are at risk.  If you do not have HIV, but are at risk, it may be recommended that you take a prescription medicine daily to prevent HIV infection. This is called pre-exposure prophylaxis (PrEP). You are considered at risk if: ? You are sexually active and do not regularly use condoms or know the HIV status of your partner(s). ? You take drugs by injection. ? You are sexually active with a partner who has HIV.  Talk with your health care provider about whether you are at high risk of being infected with HIV. If you choose to begin PrEP, you should first be tested for HIV. You should then be tested every 3 months for as long as you are taking PrEP. Pregnancy  If you are premenopausal and you may become pregnant, ask your health care provider about preconception counseling.  If you may become pregnant, take 400 to 800 micrograms (mcg) of folic acid every day.  If you want to prevent pregnancy, talk to your health care provider about birth control (contraception). Osteoporosis and menopause  Osteoporosis is a disease in which the bones lose minerals and strength with aging. This can result in serious bone fractures. Your risk for osteoporosis can be identified using a bone density  scan.  If you are 28 years of age or older, or if you are at risk for osteoporosis and fractures, ask your health care provider if you should be screened.  Ask your health care provider whether you should take a calcium or vitamin D supplement to lower your risk for osteoporosis.  Menopause may have certain physical symptoms and risks.  Hormone replacement therapy may reduce some of these symptoms and risks. Talk to your health care provider about whether hormone replacement therapy is right for you. Follow these instructions at home:  Schedule regular health, dental, and eye exams.  Stay current with your immunizations.  Do not use any tobacco products including cigarettes, chewing tobacco, or electronic cigarettes.  If you are pregnant, do not drink alcohol.  If you are breastfeeding, limit how much and how often you drink alcohol.  Limit alcohol intake to no more than 1 drink per day for nonpregnant women. One drink equals 12 ounces of beer, 5 ounces of wine, or 1 ounces of hard liquor.  Do not use street drugs.  Do not share needles.  Ask your health care provider for help if you need support or information about quitting drugs.  Tell your health care provider if you often feel depressed.  Tell your health care provider if you have ever been abused or do not feel safe at home. This information is not intended to replace advice given to you by your health care provider. Make sure you discuss any questions you have with your health care provider. Document Released: 10/01/2010 Document Revised: 08/24/2015 Document Reviewed: 12/20/2014 Elsevier Interactive Patient Education  [employer].

## 2018-03-16 NOTE — Progress Notes (Signed)
Subjective:   Carrie [name] is a 67 y.o. female who presents for Medicare Annual (Subsequent) preventive examination.  Review of Systems:  Physical assessment deferred to PCP.  Cardiac Risk Factors include: advanced age (>74men, >38 women);sedentary lifestyle;hypertension     Objective:    Vitals: BP (!) 160/84   Pulse 80   Temp 98.4 F (36.9 C) (Oral)   Ht 5' (1.524 m)   Wt 174 lb 9.6 oz (79.2 kg)   SpO2 99%   BMI 34.10 kg/m   Body mass index is 34.1 kg/m.  Advanced Directives 03/16/2018 02/26/2018 10/14/2017 05/14/2017 03/14/2017 12/31/2016 11/20/2016  Does Patient Have a Medical Advance Directive? No Yes No No No No No  Type of Advance Directive - [place]  Does patient want to make changes to medical advance directive? - - - - - - -  Would patient like information on creating a medical advance directive? No - Patient declined - No - Patient declined No - Patient declined No - Patient declined Yes (MAU/Ambulatory/Procedural Areas - Information given) No - Patient declined    Tobacco Social History   Tobacco Use  Smoking Status Never Smoker  Smokeless Tobacco Never Used     Counseling given: Yes   Clinical Intake:  Pre-visit preparation completed: Yes  Pain : 0-10 Pain Score: 8  Pain Type: Chronic pain Pain Location: Knee Pain Orientation: Left, Right Pain Onset: More than a month ago Pain Frequency: Intermittent     Nutritional Status: BMI > 30  Obese Nutritional Risks: None Diabetes: No  How often do you need to have someone help you when you read instructions, pamphlets, or other written materials from your doctor or pharmacy?: 1 - Never What is the last grade level you completed in school?: 11th grade  Interpreter Needed?: No    Past Medical History:  Diagnosis Date  . Arthritis    "back, right hand" (07/11/2014)  . Breast cancer [facility]) 2011   right breast  . Breast cancer, right breast ([place]) 05/2009  . Chronic  lower back pain   . GERD (gastroesophageal reflux disease)   . Hepatic steatosis 07/17/2011  . Hypertension   . Hypokalemia 07/17/2011  . Lower GI bleeding 07/11/2014 hospitalized  . Personal history of radiation therapy 2011   Past Surgical History:  Procedure Laterality Date  . BREAST BIOPSY Right 2011  . BREAST LUMPECTOMY Right 2011  . COLONOSCOPY    . ESOPHAGOGASTRODUODENOSCOPY N/A 07/12/2014   Procedure: ESOPHAGOGASTRODUODENOSCOPY (EGD);  Surgeon: Clarene Essex, MD;  Location: [facility] ENDOSCOPY;  Service: Endoscopy;  Laterality: N/A;  . FINGER SURGERY     middle finger on left hand  . MASTECTOMY COMPLETE / SIMPLE W/ SENTINEL NODE BIOPSY  05/2009   Archie Endo 05/23/2009  . TOTAL KNEE ARTHROPLASTY Right 10/23/2016  . TOTAL KNEE ARTHROPLASTY Right 10/23/2016   Procedure: RIGHT TOTAL KNEE ARTHROPLASTY;  Surgeon: [name], MD;  Location: [place];  Service: Orthopedics;  Laterality: Right;   Family History  Problem Relation Age of Onset  . Cancer Other   . Diabetes Mother   . Hypertension Mother   . Diabetes Brother   . Hypertension Father   . Arthritis/Rheumatoid Father   . Cancer Father   . Cancer Maternal Grandfather   . Heart disease Brother    Social History   Socioeconomic History  . Marital status: Single    Spouse name: Not on file  . Number of children: 0  .  Years of education: 80  . Highest education level: 11th grade  Occupational History  . Occupation: retired    Comment: housekeeping  Social Needs  . Financial resource strain: Not hard at all  . Food insecurity:    Worry: Never true    Inability: Never true  . Transportation needs:    Medical: No    Non-medical: No  Tobacco Use  . Smoking status: Never Smoker  . Smokeless tobacco: Never Used  Substance and Sexual Activity  . Alcohol use: Yes    Alcohol/week: 14.0 standard drinks    Types: 10 Glasses of wine, 4 Shots of liquor per week    Comment: daily since her 20s several a day   . Drug use: Yes     Frequency: 7.0 times per week    Types: Marijuana  . Sexual activity: Yes    Birth control/protection: Post-menopausal, Condom    Comment: 2 partners in last year without condom use  Lifestyle  . Physical activity:    Days per week: 0 days    Minutes per session: 0 min  . Stress: Not at all  Relationships  . Social connections:    Talks on phone: More than three times a week    Gets together: More than three times a week    Attends religious service: Never    Active member of club or organization: No    Attends meetings of clubs or organizations: Never    Relationship status: Never married  Other Topics Concern  . Not on file  Social History Narrative   Current Social History 12/31/2016        Who lives at home: Patient lives alone in one level home; has stairs at front door; has hand rail; no throw rugs; has grab bars in bathroom   Transportation: Patient relies on friend to drive her    Important Relationships: Orvil Feil (friend) and Evelina Bucy (sister)    Does not like to cook, hard to cook for one person, has a small appetite, does not like microwave meals   Pets: None    Education / Work:  36 th grade/ None       Interests / Fun: no hobbies, watches some TV   Current Stressors: None Religious / Personal Beliefs:  Does not attend church                      Outpatient Encounter Medications as of 03/16/2018  Medication Sig  . amLODipine (NORVASC) 10 MG tablet Take 1 tablet (10 mg total) by mouth daily.  . baclofen (LIORESAL) 10 MG tablet Take 1 tablet (10 mg total) by mouth 3 (three) times daily.  Marland Kitchen gabapentin (NEURONTIN) 100 MG capsule Take 1 capsule (100 mg total) by mouth 3 (three) times daily.  . pantoprazole (PROTONIX) 20 MG tablet Take 1 tablet (20 mg total) by mouth daily.  . calcium carbonate (OSCAL) 1500 (600 Ca) MG TABS tablet Take 600 mg by mouth daily.  . Cholecalciferol (VITAMIN D-1000 MAX ST) 1000 units tablet Take by mouth.  . methocarbamol (ROBAXIN)  500 MG tablet Take 1 tablet (500 mg total) by mouth every 6 (six) hours as needed for muscle spasms. (Patient not taking: Reported on 03/16/2018)  . thiamine (VITAMIN B-1) 100 MG tablet Take 100 mg by mouth daily.   No facility-administered encounter medications on file as of 03/16/2018.     Activities of Daily Living In your present state of health, do you  have any difficulty performing the following activities: 03/16/2018  Hearing? N  Vision? N  Difficulty concentrating or making decisions? N  Walking or climbing stairs? N  Comment avoids stairs  Dressing or bathing? N  Doing errands, shopping? N  Preparing Food and eating ? N  Using the Toilet? N  In the past six months, have you accidently leaked urine? N  Do you have problems with loss of bowel control? N  Managing your Medications? N  Managing your Finances? N  Housekeeping or managing your Housekeeping? N  Some recent data might be hidden   Patient Care Team: Sherene Sires, DO as PCP - General [name], MD as Consulting Physician (Orthopedic Surgery)    Assessment:   This is a routine wellness examination for Lotoya.  Exercise Activities and Dietary recommendations Current Exercise Habits: The patient does not participate in regular exercise at present  Goals    . Patient Stated     "Stop hurting". Knee pain, and back pain.    . Weight (lb) < 178 lb (80.7 kg)     7% weight loss       Fall Risk Fall Risk  03/16/2018 10/14/2017 12/31/2016 08/22/2016 08/14/2016  Falls in the past year? 0 No Yes No No  Number falls in past yr: - - 1 - -  Injury with Fall? - - Yes - -  Comment - - right knee - -  Risk Factor Category  - - (No Data) - -  Comment - - PCP notified - -  Risk for fall due to : - - (No Data) - -  Risk for fall due to: Comment - - uneven walkway - -  Follow up - - Falls evaluation completed;Education provided;Falls prevention discussed - -   Is the patient's home free of loose throw rugs in walkways,  pet beds, electrical cords, etc?   yes      Grab bars in the bathroom? yes      Handrails on the stairs?   yes      Adequate lighting?   yes   Depression Screen PHQ 2/9 Scores 03/16/2018 10/14/2017 05/14/2017 04/17/2017  PHQ - 2 Score 0 0 0 0     Cognitive Function MMSE - Mini Mental State Exam 03/16/2018  Orientation to time 5  Orientation to Place 5  Registration 3  Attention/ Calculation 5  Recall 3  Language- name 2 objects 2  Language- repeat 1  Language- follow 3 step command 3  Language- read & follow direction 1  Write a sentence 1  Copy design 1  Total score 30     6CIT Screen 03/16/2018  What Year? 0 points  What month? 0 points  What time? 0 points  Count back from 20 0 points  Months in reverse 0 points  Repeat phrase 0 points  Total Score 0    Immunization History  Administered Date(s) Administered  . Influenza,inj,Quad PF,6+ Mos 03/15/2014, 02/09/2015, 12/31/2016, 03/16/2018  . Pneumococcal Polysaccharide-23 07/13/2014  . Td 02/05/1999  . Tdap 03/15/2014     Screening Tests Health Maintenance  Topic Date Due  . PNA vac Low Risk Adult (1 of 2 - PCV13) 03/17/2019 (Originally 09/25/2017)  . PAP SMEAR-Modifier  03/16/2019  . MAMMOGRAM  04/24/2019  . COLONOSCOPY  08/28/2022  . TETANUS/TDAP  03/15/2024  . INFLUENZA VACCINE  Completed  . DEXA SCAN  Completed  . Hepatitis C Screening  Completed  . HIV Screening  Completed  Cancer Screenings: Lung: Low Dose CT Chest recommended if Age 18-80 years, 30 pack-year currently smoking OR have quit w/in 15years. Patient does not qualify. Breast:  Up to date on Mammogram? Yes   Up to date of Bone Density/Dexa? Yes Colorectal: up to date  Additional Screenings: : Hepatitis C Screening: done     Plan:  Flu vaccine given today. Prevnar 13 postponed due to new recommendations. Patient BP elevated today, has taken meds. Patient brought home BP cuff and was instructed in use. Patient c/o knee and back pain  throughout [facility]. F/U appt made with PCP for HTN and pain.   I have personally reviewed and noted the following in the patient's chart:   . Medical and social history . Use of alcohol, tobacco or illicit drugs  . Current medications and supplements . Functional ability and status . Nutritional status . Physical activity . Advanced directives . List of other physicians . Hospitalizations, surgeries, and ER visits in previous 12 months . Vitals . Screenings to include cognitive, depression, and falls . Referrals and appointments  In addition, I have reviewed and discussed with patient certain preventive protocols, quality metrics, and best practice recommendations. A written personalized care plan for preventive services as well as general preventive health recommendations were provided to patient.     Esau Grew, RN  03/16/2018

## 2018-03-19 ENCOUNTER — Other Ambulatory Visit: Payer: Self-pay | Admitting: Family Medicine

## 2018-03-20 ENCOUNTER — Ambulatory Visit: Payer: Self-pay | Admitting: Family Medicine

## 2018-03-23 ENCOUNTER — Ambulatory Visit: Payer: Medicare HMO

## 2018-04-03 ENCOUNTER — Ambulatory Visit: Payer: Medicare HMO | Admitting: Family Medicine

## 2018-04-06 ENCOUNTER — Other Ambulatory Visit: Payer: Self-pay | Admitting: Family Medicine

## 2018-04-06 DIAGNOSIS — G629 Polyneuropathy, unspecified: Secondary | ICD-10-CM

## 2018-04-23 ENCOUNTER — Other Ambulatory Visit: Payer: Self-pay

## 2018-04-23 ENCOUNTER — Ambulatory Visit (INDEPENDENT_AMBULATORY_CARE_PROVIDER_SITE_OTHER): Payer: Medicare HMO | Admitting: Family Medicine

## 2018-04-23 ENCOUNTER — Encounter: Payer: Self-pay | Admitting: Family Medicine

## 2018-04-23 VITALS — BP 110/52 | HR 74 | Temp 97.0°F | Wt 173.0 lb

## 2018-04-23 DIAGNOSIS — F1010 Alcohol abuse, uncomplicated: Secondary | ICD-10-CM | POA: Diagnosis not present

## 2018-04-23 DIAGNOSIS — L720 Epidermal cyst: Secondary | ICD-10-CM | POA: Diagnosis not present

## 2018-04-23 DIAGNOSIS — G629 Polyneuropathy, unspecified: Secondary | ICD-10-CM | POA: Diagnosis not present

## 2018-04-23 DIAGNOSIS — I10 Essential (primary) hypertension: Secondary | ICD-10-CM

## 2018-04-23 DIAGNOSIS — E559 Vitamin D deficiency, unspecified: Secondary | ICD-10-CM | POA: Diagnosis not present

## 2018-04-23 DIAGNOSIS — L309 Dermatitis, unspecified: Secondary | ICD-10-CM | POA: Diagnosis not present

## 2018-04-23 LAB — POCT GLYCOSYLATED HEMOGLOBIN (HGB A1C): Hemoglobin A1C: 4.9 % (ref 4.0–5.6)

## 2018-04-23 MED ORDER — GABAPENTIN 100 MG PO CAPS: 200.0000 mg | ORAL_CAPSULE | Freq: Three times a day (TID) | ORAL | 0 refills | Status: DC

## 2018-04-23 MED ORDER — HYDROCORTISONE 1 % EX OINT: 1.0000 "application " | TOPICAL_OINTMENT | Freq: Two times a day (BID) | CUTANEOUS | 0 refills | Status: DC

## 2018-04-23 NOTE — Progress Notes (Signed)
Subjective: Chief Complaint  Patient presents with  . Hand Pain     HPI: Carrie [name] is a 68 y.o. presenting to clinic today to discuss the following:  1 Hand Pain States that one week ago, she woke up, took a drink of water, then vomited and had cramping in her hands that was very severe.  Went away in about 15 minutes.  She stretched them out and they got better.  She has not had cramping since them.  Has numbness and tingling in her feet and hands about 3 months ago.  She has been told that she needs back surgery, but declines.  Takes gabapentin and states that it helps "a little bit."  Patient also states that she takes baclofen which also improves her symptoms.  Denies bowel or bladder incontinence.  No weakness in arms or legs.  No saddle anesthesia.  Does not take vitamins.  Is not taking Vitamin D or Thiamine.  Drinks "a lot" of alcohol.  Drinks a pint of vodka a day.  2 Bump on right side of temple First noticed it about 1 year ago.  She thinks that it has gotten a little bit bigger, but is unsure when.  No trauma to the area.  Not painful.  No pruritis.  It doesn't bother her, "I just know it's there."  No bleeding or ulceration.  Nothing has ever come out of it. Has tried to squeeze it without success.  3 Vitamin D Deficiency She has been out of this "a long time."  Has not been supplementing.  Last Vit D level 17 in Janurary 2019.  4 Alcohol Use Patient states that she is not bothered by her drinking and does not want to talk about quitting.  Drinks 1 pint vodka a day.   Health Maintenance: needs A1c     ROS noted in HPI.   Past Medical, Surgical, Social, and Family History Reviewed & Updated per EMR.   Pertinent Historical Findings include:   Social History   Tobacco Use  Smoking Status Never Smoker  Smokeless Tobacco Never Used      Objective: BP (!) 110/52   Pulse 74   Temp (!) 97 F (36.1 C) (Oral)   Wt 173 lb (78.5 kg)   SpO2 96%   BMI 33.79  kg/m  Vitals and nursing notes reviewed  Physical Exam: General: 68 y.o. female in NAD Cardio: RRR no m/r/g Lungs: CTAB, no wheezing, no rhonchi, no crackles Skin: warm and dry, 0.5cm movable non-tender nodule with one overlying scale on right temple, no ulceration noted, no follicle seen in center MSK: 5/5 strength BUE/BLE, sensation intact throughout upper extremities, negative Phalen's and Tinel's, 2+ pulses dorasalis pedis, feet b/l with sensation intact throughout     Results for orders placed or performed in visit on 04/23/18 (from the past 72 hour(s))  HgB A1c     Status: None   Collection Time: 04/23/18 10:50 AM  Result Value Ref Range   Hemoglobin A1C 4.9 4.0 - 5.6 %   HbA1c POC (<> result, manual entry)     HbA1c, POC (prediabetic range)     HbA1c, POC (controlled diabetic range)      Assessment/Plan:  Epidermoid cyst of skin Right temple small nodule, likely overlying dermatitis from irritation and underlying epidermoid cyst.  Reassured as only skin change is a single scale and there is no ulceration or bleeding.  Exam not suggestive of BCC.   - treat overlying  dermatitis with hydrocortisone cream x 2 weeks - patient instructed to not bother the area - return in one month if would like the cyst excised  Essential hypertension, benign BP today 110/52.  Continue norvasc.  Neuropathy Suspect that this is a combination of known spinal stenosis as well as vitamin deficiencies given history of deficiencies and alcohol abuse.  Patient continues to refuse surgery.  Agrees to increasing gabapentin dose. - increase gabapentin to 200mg  TID - cont baclofen - check CMP, thiamine, folate, vitamin B12, vitamin D - will replete as necessary - patient resistant to alcohol cessation   Alcohol abuse Discussed contribution to neuropathy.  Patient resistant to discussing alcohol use further and states that she does not plan to quit. - assess CMP for liver function, INR, thiamine,  folate  Vitamin D deficiency Last checked 04/2017 and was 17.  Patient has not been compliant with her supplementation. - check Vit D, replete as necessary     PATIENT EDUCATION PROVIDED: See AVS    Diagnosis and plan along with any newly prescribed medication(s) were discussed in detail with this patient today. The patient verbalized understanding and agreed with the plan. Patient advised if symptoms worsen return to clinic or ER.   Health Maintainance: A1c 4.9   Orders Placed This Encounter  Procedures  . CBC  . Comprehensive metabolic panel  . Vitamin D, 25-hydroxy  . Vitamin B1  . Protime-INR  . Vitamin B12  . Folate  . HgB A1c    Meds ordered this encounter  Medications  . hydrocortisone 1 % ointment    Sig: Apply 1 application topically 2 (two) times daily.    Dispense:  30 g    Refill:  0  . gabapentin (NEURONTIN) 100 MG capsule    Sig: Take 2 capsules (200 mg total) by mouth 3 (three) times daily.    Dispense:  180 capsule    Refill:  [place], DO 04/23/2018, 12:08 PM PGY-1 [place]

## 2018-04-23 NOTE — Assessment & Plan Note (Signed)
Last checked 04/2017 and was 17.  Patient has not been compliant with her supplementation. - check Vit D, replete as necessary

## 2018-04-23 NOTE — Progress Notes (Signed)
a1c

## 2018-04-23 NOTE — Assessment & Plan Note (Signed)
Discussed contribution to neuropathy.  Patient resistant to discussing alcohol use further and states that she does not plan to quit. - assess CMP for liver function, INR, thiamine, folate

## 2018-04-23 NOTE — Assessment & Plan Note (Addendum)
BP today 110/52.  Continue norvasc.

## 2018-04-23 NOTE — Assessment & Plan Note (Signed)
Suspect that this is a combination of known spinal stenosis as well as vitamin deficiencies given history of deficiencies and alcohol abuse.  Patient continues to refuse surgery.  Agrees to increasing gabapentin dose. - increase gabapentin to 200mg  TID - cont baclofen - check CMP, thiamine, folate, vitamin B12, vitamin D - will replete as necessary - patient resistant to alcohol cessation

## 2018-04-23 NOTE — Patient Instructions (Signed)
Thank you for coming to see me today. It was a pleasure. Today we talked about:   Numbness and Tingling: We will increase your gabapentin to 200mg  three times a day.  We will check some labs and I will call you with the results if they are abnormal and if we need to start you on some vitamin supplements.  Bump: this is likely a little cyst that has some overlying irritation.  Use a small amount of hydrocortisone cream on the area twice a day for the next two weeks.  Try to avoid scratching or pinching the area.  If it is still bothering you in a month, make an appointment to have it removed.  Please follow-up with Dr. Criss Rosales in 6 months or sooner as needed.  If you have any questions or concerns, please do not hesitate to call the office at [phone].  Best,   [place] Constable, DO

## 2018-04-23 NOTE — Assessment & Plan Note (Signed)
Right temple small nodule, likely overlying dermatitis from irritation and underlying epidermoid cyst.  Reassured as only skin change is a single scale and there is no ulceration or bleeding.  Exam not suggestive of BCC.   - treat overlying dermatitis with hydrocortisone cream x 2 weeks - patient instructed to not bother the area - return in one month if would like the cyst excised

## 2018-04-24 ENCOUNTER — Other Ambulatory Visit: Payer: Self-pay | Admitting: Family Medicine

## 2018-04-24 DIAGNOSIS — E559 Vitamin D deficiency, unspecified: Secondary | ICD-10-CM

## 2018-04-24 DIAGNOSIS — E876 Hypokalemia: Secondary | ICD-10-CM

## 2018-04-24 MED ORDER — POTASSIUM CHLORIDE CRYS ER 20 MEQ PO TBCR: 20.0000 meq | EXTENDED_RELEASE_TABLET | Freq: Two times a day (BID) | ORAL | 0 refills | Status: DC

## 2018-04-24 MED ORDER — CHOLECALCIFEROL 25 MCG (1000 UT) PO TABS: 1000.0000 [IU] | ORAL_TABLET | Freq: Every day | ORAL | 12 refills | Status: DC

## 2018-04-24 NOTE — Progress Notes (Signed)
Patient called and informed of lab results.  K 3.1 and Vitamin D 15.1.  Prescribed K-Dur 53mEq BID and Cholecalciferol 1000 units daily.  Patient informed to return in the beginning of next week for recheck of K.  Also informed should return in 1 month for follow up of neuropathy and vit D levels with her PCP.

## 2018-04-29 LAB — COMPREHENSIVE METABOLIC PANEL
ALT: 51 IU/L — AB (ref 0–32)
AST: 106 IU/L — ABNORMAL HIGH (ref 0–40)
Albumin/Globulin Ratio: 1.3 (ref 1.2–2.2)
Albumin: 4.4 g/dL (ref 3.8–4.8)
Alkaline Phosphatase: 53 IU/L (ref 39–117)
BILIRUBIN TOTAL: 0.5 mg/dL (ref 0.0–1.2)
BUN / CREAT RATIO: 11 — AB (ref 12–28)
BUN: 9 mg/dL (ref 8–27)
CHLORIDE: 93 mmol/L — AB (ref 96–106)
CO2: 28 mmol/L (ref 20–29)
Calcium: 8.9 mg/dL (ref 8.7–10.3)
Creatinine, Ser: 0.81 mg/dL (ref 0.57–1.00)
GFR calc non Af Amer: 76 mL/min/{1.73_m2} (ref >59)
GFR, EST AFRICAN AMERICAN: 88 mL/min/{1.73_m2} (ref >59)
Globulin, Total: 3.5 g/dL (ref 1.5–4.5)
Glucose: 84 mg/dL (ref 65–99)
Potassium: 3.1 mmol/L — ABNORMAL LOW (ref 3.5–5.2)
Sodium: 143 mmol/L (ref 134–144)
TOTAL PROTEIN: 7.9 g/dL (ref 6.0–8.5)

## 2018-04-29 LAB — PROTIME-INR
INR: 1.1 (ref 0.8–1.2)
Prothrombin Time: 11.4 s (ref 9.1–12.0)

## 2018-04-29 LAB — FOLATE: Folate: 4.9 ng/mL (ref >3.0)

## 2018-04-29 LAB — CBC
Hematocrit: 35.1 % (ref 34.0–46.6)
Hemoglobin: 11.7 g/dL (ref 11.1–15.9)
MCH: 28.7 pg (ref 26.6–33.0)
MCHC: 33.3 g/dL (ref 31.5–35.7)
MCV: 86 fL (ref 79–97)
Platelets: 285 10*3/uL (ref 150–450)
RBC: 4.08 x10E6/uL (ref 3.77–5.28)
RDW: 18.3 % — ABNORMAL HIGH (ref 11.7–15.4)
WBC: 4.1 10*3/uL (ref 3.4–10.8)

## 2018-04-29 LAB — VITAMIN B1: Thiamine: 57 nmol/L — ABNORMAL LOW (ref 66.5–200.0)

## 2018-04-29 LAB — VITAMIN D 25 HYDROXY (VIT D DEFICIENCY, FRACTURES): Vit D, 25-Hydroxy: 15.1 ng/mL — ABNORMAL LOW (ref 30.0–100.0)

## 2018-04-29 LAB — VITAMIN B12: Vitamin B-12: 397 pg/mL (ref 232–1245)

## 2018-05-20 ENCOUNTER — Other Ambulatory Visit: Payer: Self-pay | Admitting: Family Medicine

## 2018-05-20 DIAGNOSIS — E876 Hypokalemia: Secondary | ICD-10-CM

## 2018-05-20 DIAGNOSIS — G629 Polyneuropathy, unspecified: Secondary | ICD-10-CM

## 2018-06-20 ENCOUNTER — Other Ambulatory Visit: Payer: Self-pay | Admitting: Family Medicine

## 2018-06-20 DIAGNOSIS — G629 Polyneuropathy, unspecified: Secondary | ICD-10-CM

## 2018-06-29 ENCOUNTER — Other Ambulatory Visit: Payer: Self-pay | Admitting: Family Medicine

## 2018-07-17 ENCOUNTER — Telehealth: Payer: Self-pay | Admitting: *Deleted

## 2018-07-17 NOTE — Telephone Encounter (Signed)
Pt had called after hours call center. States she is having right breast discomfort, tender to touch at the top in the crease of her breast. Denies any lumps, redness or bruising.  Dr Jana Hakim would like her to take tylenol for the discomfort and can put ice to the area as well.   Please call us on Tuesday to let us know how she is doing.

## 2018-08-05 ENCOUNTER — Other Ambulatory Visit: Payer: Self-pay | Admitting: Family Medicine

## 2018-08-05 DIAGNOSIS — I10 Essential (primary) hypertension: Secondary | ICD-10-CM

## 2018-08-25 ENCOUNTER — Other Ambulatory Visit: Payer: Self-pay | Admitting: Family Medicine

## 2018-08-25 DIAGNOSIS — E876 Hypokalemia: Secondary | ICD-10-CM

## 2018-08-25 DIAGNOSIS — G629 Polyneuropathy, unspecified: Secondary | ICD-10-CM

## 2018-09-03 ENCOUNTER — Encounter: Payer: Self-pay | Admitting: Family Medicine

## 2018-09-03 ENCOUNTER — Other Ambulatory Visit: Payer: Self-pay

## 2018-09-03 ENCOUNTER — Ambulatory Visit (INDEPENDENT_AMBULATORY_CARE_PROVIDER_SITE_OTHER): Payer: Medicare HMO | Admitting: Family Medicine

## 2018-09-03 VITALS — BP 114/76 | HR 79 | Ht 60.0 in | Wt 166.0 lb

## 2018-09-03 DIAGNOSIS — D170 Benign lipomatous neoplasm of skin and subcutaneous tissue of head, face and neck: Secondary | ICD-10-CM | POA: Diagnosis not present

## 2018-09-03 DIAGNOSIS — W57XXXA Bitten or stung by nonvenomous insect and other nonvenomous arthropods, initial encounter: Secondary | ICD-10-CM | POA: Diagnosis not present

## 2018-09-03 NOTE — Patient Instructions (Addendum)
Dear Carrie [name],   It was very nice to see you! Thank you for taking your time to come in to be seen. Today, we discussed the following:   Ear Bump   That comes on your ear appears like an insect bite.  It did not feel like there was any fluid inside of it or anything to drain today.  Please continue to keep the area clean and not to touch it as to avoid infection.  It should resolve on its own within 1 to 2 weeks.  If it gets bigger, or you feel fluid collection, please call to make an appointment to be seen.  Face bump  The bump on your face is most consistent with something called a lipoma.  It is a benign collection of fat cells.  Typically, it is only a cosmetic nuisance.  I will send you a referral to a dermatologist so that you can be evaluated for possible removal.  Be well,   Dr. Zettie Cooley [facility] [phone]   Sign up for MyChart for instant access to your health profile, labs, orders, upcoming appointments or to contact your provider with questions.    Insect Bite, Adult An insect bite can make your skin red, itchy, and swollen. Some insects can spread disease to people with a bite. However, most insect bites do not lead to disease, and most are not serious. What are the causes? Insects may bite for many reasons, including:  Hunger.  To defend themselves. Insects that bite include:  Spiders.  Mosquitoes.  Ticks.  Fleas.  Ants.  Flies.  Kissing bugs.  Chiggers. What are the signs or symptoms? Symptoms of this condition include:  Itching or pain in the bite area.  Redness and swelling in the bite area.  An open wound (skin ulcer). Symptoms often last for 2-4 days. In rare cases, a person may have a very bad allergic reaction (anaphylactic reaction) to a bite. Symptoms of an anaphylactic reaction may include:  Feeling warm in the face (flushed). Your face may turn red.  Itchy, red, swollen areas of skin (hives).  Swelling of  the: ? Eyes. ? Lips. ? Face. ? Mouth. ? Tongue. ? Throat.  Trouble with any of these: ? Breathing. ? Talking. ? Swallowing.  Loud breathing (wheezing).  Feeling dizzy or light-headed.  Passing out (fainting).  Pain or cramps in your belly.  Throwing up (vomiting).  Watery poop (diarrhea). How is this treated? Treatment is usually not needed. Symptoms often go away on their own. When treatment is needed, it may involve:  Putting a cream or lotion on the bite area. This helps with itching.  Taking an antibiotic medicine. This treatment is needed if the bite area gets infected.  Getting a tetanus shot, if you are not up to date on this vaccine.  Putting ice on the affected area.  Using medicines called antihistamines. This treatment may be needed if you have itching or an allergic reaction to the insect bite.  Giving yourself a shot of medicine (epinephrine) using an auto-injector "pen" if you have an anaphylactic reaction to a bite. Your doctor will teach you how to use this pen. Follow these instructions at home: Bite area care   Do not scratch the bite area.  Keep the bite area clean and dry.  Wash the bite area every day with soap and water as told by your doctor.  Check the bite area every day for signs of infection. Check  for: ? Redness, swelling, or pain. ? Fluid or blood. ? Warmth. ? Pus or a bad smell. Managing pain, itching, and swelling   You may put any of these on the bite area as told by your doctor: ? A paste made of baking soda and water. ? Cortisone cream. ? Calamine lotion.  If told, put ice on the bite area. ? Put ice in a plastic bag. ? Place a towel between your skin and the bag. ? Leave the ice on for 20 minutes, 2-3 times a day. General instructions  Apply or take over-the-counter and prescription medicines only as told by your doctor.  If you were prescribed an antibiotic medicine, take or apply it as told by your doctor. Do  not stop using the antibiotic even if your condition improves.  Keep all follow-up visits as told by your doctor. This is important. How is this prevented? To help you have a lower risk of insect bites:  When you are outside, wear clothing that covers your arms and legs.  Use insect repellent. The best insect repellents contain one of these: ? DEET. ? Picaridin. ? Oil of lemon eucalyptus (OLE). ? IR3535.  Consider spraying your clothing with a pesticide called permethrin. Permethrin helps prevent insect bites. It works for several weeks and for up to 5-6 clothing washes. Do not apply permethrin directly to the skin.  If your home windows do not have screens, think about putting some in.  If you will be sleeping in an area where there are mosquitoes, consider covering your sleeping area with a mosquito net. Contact a doctor if:  You have redness, swelling, or pain in the bite area.  You have fluid or blood coming from the bite area.  The bite area feels warm to the touch.  You have pus or a bad smell coming from the bite area.  You have a fever. Get help right away if:  You have joint pain.  You have a rash.  You feel more tired or sleepy than you normally do.  You have neck pain.  You have a headache.  You feel weaker than you normally do.  You have signs of an anaphylactic reaction. Signs may include: ? Feeling warm in the face. ? Itchy, red, swollen areas of skin. ? Swelling of your:  Eyes.  Lips.  Face.  Mouth.  Tongue.  Throat. ? Trouble with any of these:  Breathing.  Talking.  Swallowing. ? Loud breathing. ? Feeling dizzy or light-headed. ? Passing out. ? Pain or cramps in your belly. ? Throwing up. ? Watery poop. These symptoms may be an emergency. Do not wait to see if the symptoms will go away. Do this right away:  Use your auto-injector pen as you have been told.  Get medical help. Call your local emergency services (911 in the  [place] Do not drive yourself to the hospital. Summary  An insect bite can make your skin red, itchy, and swollen.  Treatment is usually not needed. Symptoms often go away on their own.  Do not scratch the bite area. Keep it clean and dry.  Ice can help with pain and itching from the bite. This information is not intended to replace advice given to you by your health care provider. Make sure you discuss any questions you have with your health care provider. Document Released: 03/15/2000 Document Revised: 09/26/2017 Document Reviewed: 09/26/2017 Elsevier Interactive Patient Education  2019 [employer].

## 2018-09-03 NOTE — Progress Notes (Signed)
Established Patient - Clinic Visit Subjective  Subjective  Patient ID: MRN [id number]  Date of birth: March 20, 1951   PCP: Sherene Sires, DO Name: Carrie [name], 68 y.o. female  CC: Ear Pain  # Bump on ear Patient reports that she has had a bump on her right ear just above her second ear piercing on her pinna that started last week.  She reports that it is not painful to soft touch but painful if squeezed.  She does report that it has been red and warm with some white discharge.  She denies pruritus.  She does spend a lot of time outside. Patient denies any fevers, n/v.   #Bump on face Patient reports that she has had a small bump on her right lateral face that has been there for about 1 year.  This bump is never painful, itchy, red, swollen.  She believes that it has been growing in size but very slowly.  She has never experienced any bump like this previously.    ROS: See HPI  HISTORY Meds  Allergies: Reviewed as appropriate  Pertinent PMHx: Hx of breast cancer (RT) with radiation therapy  Social Hx: Jailen reports that she has never smoked. She has never used smokeless tobacco. She reports current alcohol use of about 14.0 standard drinks of alcohol per week. She reports current drug use. Frequency: 7.00 times per week. Drug: Marijuana. Social History   Social History Narrative   Current Social History 12/31/2016        Who lives at home: Patient lives alone in one level home; has stairs at front door; has hand rail; no throw rugs; has grab bars in bathroom   Transportation: Patient relies on friend to drive her    Important Relationships: Orvil Feil (friend) and Evelina Bucy (sister)    Does not like to cook, hard to cook for one person, has a small appetite, does not like microwave meals   Pets: None    Education / Work:  72 th grade/ None       Interests / Fun: no hobbies, watches some TV   Current Stressors: None Religious / Personal Beliefs:  Does not attend church                         Objective   Objective  Physical Exam:  BP 114/76   Pulse 79   Ht 5' (1.524 m)   Wt 75.3 kg   SpO2 97%   BMI 32.42 kg/m  General: No acute distress, sitting on exam table comfortably HEENT: Ears-right pinna with 2 punctated palpable adjacent masses.  The masses are hard and uniform.  Her skin is mildly warm and erythematous around the area.  Tympanic membranes and canal within normal limits.  No other abnormalities appreciated on exterior ears.  Face-there is a small half centimeter in diameter nodular mass just below her temporal region on the right side.  It is mobile and without erythema. LAD: no LAD appreciated in neck bilaterally.  Chest: RRR.  No BLEE Lungs: CTA B.  No wheezing or rales appreciated. Integument: No other rashes or nodules appreciated on exposed skin.  Pertinent Labs & Imaging:  Reviewed in chart as appropriate   Assessment  Assessment & Plan  Lipoma of face Solitary palpable, rubbery lesion on right lateral face under temporal.  History and physical exam are most consistent with lipoma.  Can also consider ganglion cyst, however less likely due to location.  Less  likely infectious process as there is no fluctuation, there is no warmth or history of drainage.  Referral to dermatology for removal  Expectant management and reassurance  Bug bite Physical exam and history are most consistent with a bug bite.  There is very little fluctuance of either palpable mass and there is no drainage with pressure.   Conservative management.  Can apply warm compress if swelling continues  Hydrocortisone cream if increased pruritus  Avoid touching the area for risk of infection  Return precautions provided  Follow-up: No future appointments.    Zettie Cooley, M.D. [place]  PGY -1 09/06/2018, 2:41 PM

## 2018-09-06 ENCOUNTER — Encounter: Payer: Self-pay | Admitting: Family Medicine

## 2018-09-06 DIAGNOSIS — D170 Benign lipomatous neoplasm of skin and subcutaneous tissue of head, face and neck: Secondary | ICD-10-CM | POA: Insufficient documentation

## 2018-09-06 DIAGNOSIS — W57XXXA Bitten or stung by nonvenomous insect and other nonvenomous arthropods, initial encounter: Secondary | ICD-10-CM | POA: Insufficient documentation

## 2018-09-06 NOTE — Assessment & Plan Note (Signed)
Physical exam and history are most consistent with a bug bite.  There is very little fluctuance of either palpable mass and there is no drainage with pressure.   Conservative management.  Can apply warm compress if swelling continues  Hydrocortisone cream if increased pruritus  Avoid touching the area for risk of infection  Return precautions provided

## 2018-09-06 NOTE — Assessment & Plan Note (Signed)
Solitary palpable, rubbery lesion on right lateral face under temporal.  History and physical exam are most consistent with lipoma.  Can also consider ganglion cyst, however less likely due to location.  Less likely infectious process as there is no fluctuation, there is no warmth or history of drainage.  Referral to dermatology for removal  Expectant management and reassurance

## 2018-09-21 ENCOUNTER — Other Ambulatory Visit: Payer: Self-pay | Admitting: Family Medicine

## 2018-09-21 DIAGNOSIS — E876 Hypokalemia: Secondary | ICD-10-CM

## 2018-09-21 DIAGNOSIS — G629 Polyneuropathy, unspecified: Secondary | ICD-10-CM

## 2018-09-29 ENCOUNTER — Ambulatory Visit (INDEPENDENT_AMBULATORY_CARE_PROVIDER_SITE_OTHER): Payer: Medicare HMO | Admitting: Orthopaedic Surgery

## 2018-09-29 ENCOUNTER — Other Ambulatory Visit: Payer: Self-pay

## 2018-09-29 ENCOUNTER — Encounter: Payer: Self-pay | Admitting: Orthopaedic Surgery

## 2018-09-29 VITALS — Ht 60.0 in | Wt 166.0 lb

## 2018-09-29 DIAGNOSIS — G629 Polyneuropathy, unspecified: Secondary | ICD-10-CM

## 2018-09-29 DIAGNOSIS — M48062 Spinal stenosis, lumbar region with neurogenic claudication: Secondary | ICD-10-CM

## 2018-09-29 NOTE — Progress Notes (Signed)
Office Visit Note   Patient: Carrie [name]           Date of Birth: 1951-02-21           MRN: [id number] Visit Date: 09/29/2018              Requested by: Sherene Sires, DO [address],  [place] [address] PCP: Sherene Sires, DO   Assessment & Plan: Visit Diagnoses:  1. Spinal stenosis of lumbar region with neurogenic claudication   2. Neuropathy     Plan: Patient has peripheral neuropathy and this is bothering her feet and toes.  Her other problem is neurogenic claudication with L3-4 severe stenosis particularly in standing position noted on a myelogram.  We will check her back again in 3 months we discussed possible lumbar decompression surgery at L3-4.  We discussed walking I half block sitting resting and then repeating for exercise.  We also discussed stopping drinking and she states that she does not think that she will have any sick success at this.  I will recheck her again in 3 months.  Follow-Up Instructions: Return in about 3 months (around 12/30/2018).   Orders:  No orders of the defined types were placed in this encounter.  No orders of the defined types were placed in this encounter.     Procedures: No procedures performed   Clinical Data: No additional findings.   Subjective: Chief Complaint  Patient presents with  . Right Foot - Numbness  . Left Foot - Numbness    HPI [age]-year-old female seen with bilateral foot numbness and weakness after ambulating half block.  She gets relief with sitting position for her back pain and leg weakness but has constant numbness and tingling in her feet.  Long history of alcohol consumption.  She had anemia and is taking B vitamins starting recently.  Previous right total knee arthroplasty is doing well.  Lumbar myelogram CT scan March 2019 showed spinal stenosis at L3-4 with significant compression in standing position.  Improved with the supine position for CT scan.  Multifactorial changes.  Some anterolisthesis at  L5-S1 without stenosis.  Patient has tingling in her feet and all toes.  She is taking gabapentin without relief.  She states her balance has not been very good.  A1c in January was 4.9 in 1 year ago 5.1.  Review of Systems positive spinal stenosis L3-4.  Impingement, chronic alcohol abuse.  Thiamine deficiency, neuropathy, hypertension.  Left breast cancer stage Ia.  14 point systems otherwise negative as pertains HPI.   Objective: Vital Signs: Ht 5' (1.524 m)   Wt 166 lb (75.3 kg)   BMI 32.42 kg/m   Physical Exam Constitutional:      Appearance: She is well-developed.  HENT:     Head: Normocephalic.     Right Ear: External ear normal.     Left Ear: External ear normal.  Eyes:     Pupils: Pupils are equal, round, and reactive to light.  Neck:     Thyroid: No thyromegaly.     Trachea: No tracheal deviation.  Cardiovascular:     Rate and Rhythm: Normal rate.  Pulmonary:     Effort: Pulmonary effort is normal.  Abdominal:     Palpations: Abdomen is soft.  Skin:    General: Skin is warm and dry.     Findings: No lesion.  Neurological:     Mental Status: She is alert and oriented to person, place, and time.  Psychiatric:        Behavior: Behavior normal.     Ortho Exam patient has negative logroll to the hips well-healed right knee incision quad strength is good.  She has some balance problems with toe walking but can walk on her heels.  Anterior tib EHL is strong.  Decreased sensation stocking distribution right and left.  No gastrocsoleus atrophy.  Knee and ankle jerk are 1+ and symmetrical.  Distal pulses are palpable.  Specialty Comments:  No specialty comments available.  Imaging: No results found.   PMFS History: Patient Active Problem List   Diagnosis Date Noted  . Lipoma of face 09/06/2018  . Bug bite 09/06/2018  . Numbness and tingling of foot 10/14/2017  . Abdominal pain, right upper quadrant 05/16/2017  . Neuropathy 04/17/2017  . Cramping of feet  03/14/2017  . Mild tetrahydrocannabinol (THC) abuse 11/20/2016  . Pain in right foot 09/01/2016  . Impingement syndrome of left shoulder 09/01/2016  . Bilateral sensorineural hearing loss 07/18/2016  . Subjective tinnitus of both ears 07/18/2016  . Spinal stenosis of lumbar region with neurogenic claudication 02/14/2016  . Eustachian tube dysfunction 09/04/2015  . Back pain of thoracolumbar region 05/08/2015  . Epidermoid cyst of skin 01/10/2015  . Muscle cramping 12/05/2014  . Low back pain 06/14/2014  . Preventative health care 03/15/2014  . Diabetes mellitus screening 02/22/2014  . Bilateral knee pain 02/08/2014  . Transaminitis 07/23/2013  . Essential hypertension, benign 07/23/2013  . GERD (gastroesophageal reflux disease) 07/23/2013  . Vitamin D deficiency 07/22/2013  . Breast cancer, left breast ([place]) 07/17/2011  . FIBROIDS, UTERUS 01/30/2007  . Alcohol abuse 01/30/2007   Past Medical History:  Diagnosis Date  . Arthritis    "back, right hand" (07/11/2014)  . Breast cancer [facility]) 2011   right breast  . Breast cancer, right breast ([place]) 05/2009  . Chronic lower back pain   . GERD (gastroesophageal reflux disease)   . Hepatic steatosis 07/17/2011  . Hypertension   . Hypokalemia 07/17/2011  . Lower GI bleeding 07/11/2014 hospitalized  . Personal history of radiation therapy 2011    Family History  Problem Relation Age of Onset  . Cancer Other   . Diabetes Mother   . Hypertension Mother   . Diabetes Brother   . Hypertension Father   . Arthritis/Rheumatoid Father   . Cancer Father   . Cancer Maternal Grandfather   . Heart disease Brother     Past Surgical History:  Procedure Laterality Date  . BREAST BIOPSY Right 2011  . BREAST LUMPECTOMY Right 2011  . COLONOSCOPY    . ESOPHAGOGASTRODUODENOSCOPY N/A 07/12/2014   Procedure: ESOPHAGOGASTRODUODENOSCOPY (EGD);  Surgeon: Clarene Essex, MD;  [facility] END[facility]COPY;  Service: Endoscopy;  Laterality: N/A;  . FINGER SURGERY      middle finger on left hand  . MASTECTOMY COMPLETE / SIMPLE W/ SENTINEL NODE BIOPSY  05/2009   Archie Endo 05/23/2009  . TOTAL KNEE ARTHROPLASTY Right 10/23/2016  . TOTAL KNEE ARTHROPLASTY Right 10/23/2016   Procedure: RIGHT TOTAL KNEE ARTHROPLASTY;  Surgeon: [name], MD;  Location: [facility];  Service: Orthopedics;  Laterality: Right;   Social History   Occupational History  . Occupation: retired    Comment: housekeeping  Tobacco Use  . Smoking status: Never Smoker  . Smokeless tobacco: Never Used  Substance and Sexual Activity  . Alcohol use: Yes    Alcohol/week: 14.0 standard drinks    Types: 10 Glasses of wine, 4 Shots of liquor per week  Comment: daily since her 78s several a day   . Drug use: Yes    Frequency: 7.0 times per week    Types: Marijuana  . Sexual activity: Yes    Birth control/protection: Post-menopausal, Condom    Comment: 2 partners in last year without condom use

## 2018-09-30 ENCOUNTER — Ambulatory Visit (INDEPENDENT_AMBULATORY_CARE_PROVIDER_SITE_OTHER): Payer: Medicare HMO | Admitting: Family Medicine

## 2018-09-30 ENCOUNTER — Other Ambulatory Visit: Payer: Self-pay

## 2018-09-30 ENCOUNTER — Encounter: Payer: Self-pay | Admitting: Family Medicine

## 2018-09-30 VITALS — BP 138/80 | HR 83

## 2018-09-30 DIAGNOSIS — R300 Dysuria: Secondary | ICD-10-CM | POA: Diagnosis not present

## 2018-09-30 LAB — POCT URINALYSIS DIP (MANUAL ENTRY)
Glucose, UA: NEGATIVE mg/dL
Ketones, POC UA: NEGATIVE mg/dL
Nitrite, UA: NEGATIVE
Protein Ur, POC: 100 mg/dL — AB
Spec Grav, UA: 1.025 (ref 1.010–1.025)
Urobilinogen, UA: 1 E.U./dL
pH, UA: 6 (ref 5.0–8.0)

## 2018-09-30 MED ORDER — CEPHALEXIN 500 MG PO CAPS: 500.0000 mg | ORAL_CAPSULE | Freq: Two times a day (BID) | ORAL | 0 refills | Status: AC

## 2018-09-30 NOTE — Progress Notes (Signed)
  Established Patient - Clinic Visit Subjective  Subjective  Patient ID: MRN [id number]  Date of birth: [birth year]   PCP: Sherene Sires, DO Name: Carrie [name], 68 y.o. female  CC: painful urination Patient reports burning with urination that started nearly 24 hours ago (yesterday, June 30 evening) she denies other symptoms such as increased frequency, vaginal itching, discharge.  She has vaginal dryness which is a chronic issue for her.  She denies any rash in the area.  She is currently sexually active and recently had a sexual encounter but she reports that this is not a new partner.   ROS: See HPI  HISTORY Meds  Allergies: Reviewed as appropriate  Social Hx: Carrie [name] reports that she has never smoked. She has never used smokeless tobacco. She reports current alcohol use of about 14.0 standard drinks of alcohol per week. She reports current drug use. Frequency: 7.00 times per week. Drug: Marijuana.     Objective   Objective  Physical Exam:  BP 138/80   Pulse 83   SpO2 97%  General: NAD, non-toxic, well-appearing, sitting comfortably in chair   HEENT: [place]/AT. PERRLA. EOMI.  Abdomen: + BS. NT, ND, soft to palpation. No suprapubic tenderness. No CVA tenderness. Extremities: Warm and well perfused. Moving spontaneously.  GU: Deferred    Pertinent Labs & Imaging:  Most recent G/C in 2010, negative.   UA: moderate  Leukocytes, no bacteria, negative nitrite  Reviewed in chart as appropriate   Assessment  Assessment & Plan  Dysuria Moderate leukocytes on urinalysis without bacteria and negative nitrites.  As patient is symptomatic, will treat as UTI with Keflex 500 mg twice daily.  Patient instructed to return if symptoms do not improve within 2 to 3 days of antibiotic use.  She declines pelvic exam today but is agreeable to pelvic exam if symptoms are not resolved.  Can also consider vaginal trauma from recent intercourse in the setting of dryness.  If no improvement, swab patient for  gonorrhea chlamydia.     Zettie Cooley, M.D. [place]  PGY -1 10/02/2018, 5:47 PM

## 2018-09-30 NOTE — Patient Instructions (Signed)
Dear Jeanette Caprice,   It was very nice to see you! Thank you for taking your time to come in to be seen. Today, we discussed the following:   Painful Urination   Most consistent with a UTI.   If the antibiotics do not improve the pain in 2-3 days, please return for further testing and physical exam.   Please follow up in 2-3 days if no improvement or worsening symptoms.   Be well,   Dr. Zettie Cooley [facility] [phone]   Sign up for MyChart for instant access to your health profile, labs, orders, upcoming appointments or to contact your provider with questions.

## 2018-10-01 ENCOUNTER — Ambulatory Visit: Payer: Medicare HMO

## 2018-10-02 ENCOUNTER — Encounter: Payer: Self-pay | Admitting: Family Medicine

## 2018-10-02 DIAGNOSIS — R300 Dysuria: Secondary | ICD-10-CM | POA: Insufficient documentation

## 2018-10-02 NOTE — Assessment & Plan Note (Signed)
Moderate leukocytes on urinalysis without bacteria and negative nitrites.  As patient is symptomatic, will treat as UTI with Keflex 500 mg twice daily.  Patient instructed to return if symptoms do not improve within 2 to 3 days of antibiotic use.  She declines pelvic exam today but is agreeable to pelvic exam if symptoms are not resolved.  Can also consider vaginal trauma from recent intercourse in the setting of dryness.  If no improvement, swab patient for gonorrhea chlamydia.

## 2018-10-19 ENCOUNTER — Other Ambulatory Visit: Payer: Self-pay | Admitting: Family Medicine

## 2018-10-19 DIAGNOSIS — G629 Polyneuropathy, unspecified: Secondary | ICD-10-CM

## 2018-10-19 DIAGNOSIS — E876 Hypokalemia: Secondary | ICD-10-CM

## 2018-10-21 NOTE — Telephone Encounter (Signed)
Per Dr. Criss Rosales, [place]. If pt calls,please have them come in for lab work. Pt can make an appt or come in when she is available. Ottis Stain, CMA

## 2018-10-22 NOTE — Telephone Encounter (Signed)
LVM for pt to call office back to inform her that Dr. Criss Rosales would like for her to come in and have a lab drawn.  If she calls back please help her get this lab appointment scheduled.Christohper Dube Zimmerman Rumple, CMA

## 2018-10-22 NOTE — Telephone Encounter (Signed)
Pt called back and lab appt made.    Of note, she states that the potasium pills are too big and wants to know if there is something smaller.  Advised I would send a message to MD.  Christen Bame, CMA

## 2018-10-23 NOTE — Telephone Encounter (Signed)
Pt informed that she can dissolve KDUR in water  Per Dr. Criss Rosales. Christen Bame, CMA

## 2018-10-27 ENCOUNTER — Other Ambulatory Visit: Payer: Medicare HMO

## 2018-10-27 ENCOUNTER — Ambulatory Visit (INDEPENDENT_AMBULATORY_CARE_PROVIDER_SITE_OTHER): Payer: Medicare HMO | Admitting: Family Medicine

## 2018-10-27 ENCOUNTER — Other Ambulatory Visit: Payer: Self-pay

## 2018-10-27 VITALS — BP 122/64 | HR 84

## 2018-10-27 DIAGNOSIS — J302 Other seasonal allergic rhinitis: Secondary | ICD-10-CM | POA: Diagnosis not present

## 2018-10-27 DIAGNOSIS — E876 Hypokalemia: Secondary | ICD-10-CM

## 2018-10-27 NOTE — Progress Notes (Signed)
    Subjective:  Carrie [name] is a 68 y.o. female who presents to the [facility] today with a chief complaint of watery, itchy eyes.   HPI:  Patient has history of watery itchy eyes.   They are worse today than they had been in the past.  She has used nasal spray in the past for this.  She has a prescription for this at home.  When she found out that this was all that she needed she promptly stood up and left.  She did not want to stay for physical exam.   ROS: Denies cough, fever, chills.   Objective:  Physical Exam: BP 122/64   Pulse 84   SpO2 96%   Unable to perform because patient left before could obtain.  No results found for this or any previous visit (from the past 72 hour(s)).   Assessment/Plan:  Seasonal allergies As demonstrated by watery itchy eyes.  Unlikely to have infectious symptoms given negative review of symptoms.  Recommend patient use intranasal steroid spray for symptom control.  Patient needed prescription for this, she tells me that she did not as she was walking to the door.  Lab Orders  No laboratory test(s) ordered today    No orders of the defined types were placed in this encounter.     Marny Lowenstein, MD, MS FAMILY MEDICINE RESIDENT - PGY3 10/27/2018 2:49 PM

## 2018-10-28 LAB — BASIC METABOLIC PANEL
BUN/Creatinine Ratio: 8 — ABNORMAL LOW (ref 12–28)
BUN: 7 mg/dL — ABNORMAL LOW (ref 8–27)
CO2: 21 mmol/L (ref 20–29)
Calcium: 9.2 mg/dL (ref 8.7–10.3)
Chloride: 94 mmol/L — ABNORMAL LOW (ref 96–106)
Creatinine, Ser: 0.9 mg/dL (ref 0.57–1.00)
GFR calc Af Amer: 77 mL/min/{1.73_m2} (ref >59)
GFR calc non Af Amer: 67 mL/min/{1.73_m2} (ref >59)
Glucose: 110 mg/dL — ABNORMAL HIGH (ref 65–99)
Potassium: 3.5 mmol/L (ref 3.5–5.2)
Sodium: 141 mmol/L (ref 134–144)

## 2018-11-16 ENCOUNTER — Other Ambulatory Visit: Payer: Self-pay

## 2018-11-16 ENCOUNTER — Other Ambulatory Visit: Payer: Self-pay | Admitting: Family Medicine

## 2018-11-16 ENCOUNTER — Ambulatory Visit (INDEPENDENT_AMBULATORY_CARE_PROVIDER_SITE_OTHER): Payer: Medicare HMO | Admitting: Family Medicine

## 2018-11-16 VITALS — BP 128/72 | HR 80

## 2018-11-16 DIAGNOSIS — J302 Other seasonal allergic rhinitis: Secondary | ICD-10-CM | POA: Diagnosis not present

## 2018-11-16 DIAGNOSIS — E7889 Other lipoprotein metabolism disorders: Secondary | ICD-10-CM | POA: Diagnosis not present

## 2018-11-16 DIAGNOSIS — F1010 Alcohol abuse, uncomplicated: Secondary | ICD-10-CM

## 2018-11-16 DIAGNOSIS — E8889 Other specified metabolic disorders: Secondary | ICD-10-CM

## 2018-11-16 DIAGNOSIS — E876 Hypokalemia: Secondary | ICD-10-CM

## 2018-11-16 DIAGNOSIS — G629 Polyneuropathy, unspecified: Secondary | ICD-10-CM

## 2018-11-16 MED ORDER — LORATADINE 10 MG PO TBDP: 10.0000 mg | ORAL_TABLET | Freq: Every day | ORAL | 0 refills | Status: DC

## 2018-11-16 NOTE — Progress Notes (Signed)
    Subjective:  Carrie [name] is a 68 y.o. female who presents to the [facility] today with a chief complaint of "watery eyes".   HPI: Seasonal allergies Mild watery eyes and runny nose for the last 3 days.  No change in vision, no ear involvement, no shortness of breath or productive cough.  Steatosis Steatosis from prior imaging, patient is alcoholic and drinks 1/5 of liquor every 2 days.  Expresses 0 interest in stopping despite multiple conversations about this.  We discussed that if she decides to stop she needs to talk to [place] about withdrawal management patient understands   Objective:  Physical Exam: BP 128/72   Pulse 80   SpO2 98%  Wt: 163 Gen: NAD, resting comfortably CV: RRR with no murmurs appreciated Pulm: NWOB, CTAB with no crackles, wheezes, or rhonchi GI: Normal bowel sounds present. Soft, Nontender, Nondistended. MSK: no edema, cyanosis, or clubbing noted Skin: warm, dry Neuro: grossly normal, moves all extremities Psych: Normal affect and thought content  No results found for this or any previous visit (from the past 72 hour(s)).   Assessment/Plan:  Seasonal allergies Mild watery eyes and runny nose for the last 3 days.  No change in vision, no ear involvement, no shortness of breath or productive cough.  Prescribed antihistamine, gave return precautions  Steatosis Steatosis from prior imaging, patient is alcoholic and drinks 1/5 of liquor every 2 days.  Expresses 0 interest in stopping despite multiple conversations about this.  We discussed that if she decides to stop she needs to talk to [place] about withdrawal management patient understands  She does consent to ultrasound elastography..  Alcohol abuse Steatosis from prior imaging, patient is alcoholic and drinks 1/5 of liquor every 2 days.  Expresses 0 interest in stopping despite multiple conversations about this.  We discussed that if she decides to stop she needs to talk to [place] about withdrawal management patient  understands  She does consent to ultrasound elastography.Sherene Sires, DO FAMILY MEDICINE RESIDENT - PGY3 11/18/2018 8:49 AM

## 2018-11-18 ENCOUNTER — Encounter: Payer: Self-pay | Admitting: Family Medicine

## 2018-11-18 DIAGNOSIS — J302 Other seasonal allergic rhinitis: Secondary | ICD-10-CM | POA: Insufficient documentation

## 2018-11-18 NOTE — Assessment & Plan Note (Signed)
Mild watery eyes and runny nose for the last 3 days.  No change in vision, no ear involvement, no shortness of breath or productive cough.  Prescribed antihistamine, gave return precautions

## 2018-11-18 NOTE — Assessment & Plan Note (Signed)
Steatosis from prior imaging, patient is alcoholic and drinks 1/5 of liquor every 2 days.  Expresses 0 interest in stopping despite multiple conversations about this.  We discussed that if she decides to stop she needs to talk to [place] about withdrawal management patient understands  She does consent to ultrasound elastography.Marland Kitchen

## 2018-12-08 ENCOUNTER — Other Ambulatory Visit: Payer: Medicare HMO

## 2018-12-14 ENCOUNTER — Ambulatory Visit
Admission: RE | Admit: 2018-12-14 | Discharge: 2018-12-14 | Disposition: A | Discharge status: Home / Self Care | Payer: Medicare HMO | Source: Ambulatory Visit | Attending: Family Medicine | Admitting: Family Medicine

## 2018-12-14 ENCOUNTER — Telehealth: Payer: Self-pay | Admitting: Family Medicine

## 2018-12-14 DIAGNOSIS — E7889 Other lipoprotein metabolism disorders: Secondary | ICD-10-CM

## 2018-12-14 DIAGNOSIS — K760 Fatty (change of) liver, not elsewhere classified: Secondary | ICD-10-CM

## 2018-12-14 DIAGNOSIS — F1010 Alcohol abuse, uncomplicated: Secondary | ICD-10-CM

## 2018-12-14 DIAGNOSIS — E8889 Other specified metabolic disorders: Secondary | ICD-10-CM

## 2018-12-14 NOTE — Telephone Encounter (Signed)
Called patient's and discussed results of ultrasound elastography which include diffuse fatty liver and moderate risk for fibrosis which indicated additional testing was appropriate.  Given this we will refer to gastroenterology for evaluation, patient was called personally by Dr. Criss Rosales and notified she is aware of the plan  Dr. Criss Rosales

## 2018-12-23 ENCOUNTER — Ambulatory Visit (INDEPENDENT_AMBULATORY_CARE_PROVIDER_SITE_OTHER): Payer: Medicare HMO | Admitting: Orthopaedic Surgery

## 2018-12-23 ENCOUNTER — Ambulatory Visit: Payer: Self-pay

## 2018-12-23 ENCOUNTER — Encounter: Payer: Self-pay | Admitting: Orthopaedic Surgery

## 2018-12-23 VITALS — BP 154/86 | HR 80 | Ht 60.0 in | Wt 160.0 lb

## 2018-12-23 DIAGNOSIS — R6889 Other general symptoms and signs: Secondary | ICD-10-CM | POA: Diagnosis not present

## 2018-12-23 DIAGNOSIS — M25562 Pain in left knee: Secondary | ICD-10-CM | POA: Diagnosis not present

## 2018-12-23 DIAGNOSIS — M25561 Pain in right knee: Secondary | ICD-10-CM

## 2018-12-23 DIAGNOSIS — G8929 Other chronic pain: Secondary | ICD-10-CM

## 2018-12-23 DIAGNOSIS — M1712 Unilateral primary osteoarthritis, left knee: Secondary | ICD-10-CM

## 2018-12-23 DIAGNOSIS — M48062 Spinal stenosis, lumbar region with neurogenic claudication: Secondary | ICD-10-CM | POA: Diagnosis not present

## 2018-12-23 MED ORDER — LIDOCAINE HCL 1 % IJ SOLN
3.0000 mL | INTRAMUSCULAR | Status: AC | PRN
  Administered 2018-12-23: 3 mL

## 2018-12-23 NOTE — Progress Notes (Signed)
Office Visit Note   Patient: Carrie [name]           Date of Birth: September 03, 1950           MRN: [id number] Visit Date: 12/23/2018              Requested by: Sherene Sires, DO [address],  [place] [address] PCP: Sherene Sires, DO   Assessment & Plan: Visit Diagnoses:  1. Chronic pain of both knees   2. Arthritis of left knee   3. Spinal stenosis of lumbar region with neurogenic claudication     Plan: In hopes of giving patient some relief of her left knee pain offered injection.  After patient consent left knee was prepped with Betadine and intra-articular Marcaine/Depo-Medrol injection performed.  Advised patient that ultimately may come down to her needing left total knee replacement but we will exhaust all conservative measures first.  We did discuss the possibility of trying Visco supplementation in the future.  Regards to her lumbar spine Dr. Lorin Mercy has discussed doing an L3-4 decompression for her ongoing low back pain and neurogenic claudication symptoms.  Patient also does have pathology at L5-S1 that would be requiring instrumented fusion at some point.  We will have her follow-up in 6 weeks for recheck with him again to discuss surgical options regarding her back and he can also see how she is doing with her left knee at that time.  Follow-Up Instructions: Return in about 6 weeks (around 02/03/2019) for with Dr Lorin Mercy to discuss surgery lumbar spine.   Orders:  Orders Placed This Encounter  Procedures  . XR Knee 1-2 Views Right  . XR Knee 1-2 Views Left   No orders of the defined types were placed in this encounter.     Procedures: Large Joint Inj on 12/23/2018 10:31 AM Indications: pain Details: 25 G needle, medial approach Medications: 3 mL lidocaine 1 % Outcome: tolerated well, no immediate complications Consent was given by the patient.       Clinical Data: No additional findings.   Subjective: Chief Complaint  Patient presents with  . Right Knee  - Pain  . Left Knee - Pain  . Lower Back - Follow-up    HPI 68 year old white female comes in today with complaints of left greater than right knee pain.  Patient is status post right total knee replacement October 23, 2016.  States that left knee is more problematic.  Describes having pain and popping in the knee when she is ambulating.  Patient also continues to have ongoing issues with chronic low back pain and neurogenic claudication.  Dr. Lorin Mercy has discussed on multiple occasions doing an L3-4 decompression.  Patient also has grade 1 L5-S1 anterolisthesis when patient is in the recumbent position and this increases to borderline grade 2 when she is standing.  This was seen on CT lumbar spine June 25, 2017.  Patient states that she is scared to have low back surgery.  She told our clinical assistant today that she "stumbles a lot" and loses her balance.  Patient has a long history of EtOH abuse and fatty liver disease. Review of Systems No current cardiac pulmonary GI GU issues  Objective: Vital Signs: BP (!) 154/86   Pulse 80   Ht 5' (1.524 m)   Wt 160 lb (72.6 kg)   BMI 31.25 kg/m   Physical Exam HENT:     Head: Normocephalic.  Eyes:     Extraocular Movements: Extraocular movements  intact.     Pupils: Pupils are equal, round, and reactive to light.  Musculoskeletal:     Comments: Gait is somewhat antalgic.  Negative logroll bilateral hips.  Negative straight leg raise.  Left knee good range of motion.  Positive crepitus.  Joint line tender.  Some swelling without large effusion.  Right knee good range of motion.  Neurological:     General: No focal deficit present.  Psychiatric:        Mood and Affect: Mood normal.     Ortho Exam  Specialty Comments:  No specialty comments available.  Imaging: No results found.   PMFS History: Patient Active Problem List   Diagnosis Date Noted  . Seasonal allergies 11/18/2018  . Dysuria 10/02/2018  . Lipoma of face 09/06/2018  .  Bug bite 09/06/2018  . Numbness and tingling of foot 10/14/2017  . Abdominal pain, right upper quadrant 05/16/2017  . Neuropathy 04/17/2017  . Cramping of feet 03/14/2017  . Mild tetrahydrocannabinol (THC) abuse 11/20/2016  . Pain in right foot 09/01/2016  . Impingement syndrome of left shoulder 09/01/2016  . Bilateral sensorineural hearing loss 07/18/2016  . Subjective tinnitus of both ears 07/18/2016  . Spinal stenosis of lumbar region with neurogenic claudication 02/14/2016  . Eustachian tube dysfunction 09/04/2015  . Back pain of thoracolumbar region 05/08/2015  . Epidermoid cyst of skin 01/10/2015  . Muscle cramping 12/05/2014  . Low back pain 06/14/2014  . Preventative health care 03/15/2014  . Diabetes mellitus screening 02/22/2014  . Bilateral knee pain 02/08/2014  . Transaminitis 07/23/2013  . Essential hypertension, benign 07/23/2013  . GERD (gastroesophageal reflux disease) 07/23/2013  . Vitamin D deficiency 07/22/2013  . Breast cancer, left breast ([place]) 07/17/2011  . Steatosis 07/17/2011  . FIBROIDS, UTERUS 01/30/2007  . Alcohol abuse 01/30/2007   Past Medical History:  Diagnosis Date  . Arthritis    "back, right hand" (07/11/2014)  . Breast cancer [facility]) 2011   right breast  . Breast cancer, right breast ([place]) 05/2009  . Chronic lower back pain   . GERD (gastroesophageal reflux disease)   . Hepatic steatosis 07/17/2011  . Hypertension   . Hypokalemia 07/17/2011  . Lower GI bleeding 07/11/2014 hospitalized  . Personal history of radiation therapy 2011    Family History  Problem Relation Age of Onset  . Cancer Other   . Diabetes Mother   . Hypertension Mother   . Diabetes Brother   . Hypertension Father   . Arthritis/Rheumatoid Father   . Cancer Father   . Cancer Maternal Grandfather   . Heart disease Brother     Past Surgical History:  Procedure Laterality Date  . BREAST BIOPSY Right 2011  . BREAST LUMPECTOMY Right 2011  . COLONOSCOPY    .  ESOPHAGOGASTRODUODENOSCOPY N/A 07/12/2014   Procedure: ESOPHAGOGASTRODUODENOSCOPY (EGD);  Surgeon: Clarene Essex, MD;  Location: [facility] ENDOSCOPY;  Service: Endoscopy;  Laterality: N/A;  . FINGER SURGERY     middle finger on left hand  . MASTECTOMY COMPLETE / SIMPLE W/ SENTINEL NODE BIOPSY  05/2009   Archie Endo 05/23/2009  . TOTAL KNEE ARTHROPLASTY Right 10/23/2016  . TOTAL KNEE ARTHROPLASTY Right 10/23/2016   Procedure: RIGHT TOTAL KNEE ARTHROPLASTY;  Surgeon: [name], MD;  Location: [place];  Service: Orthopedics;  Laterality: Right;   Social History   Occupational History  . Occupation: retired    Comment: housekeeping  Tobacco Use  . Smoking status: Never Smoker  . Smokeless tobacco: Never Used  Substance and Sexual  Activity  . Alcohol use: Yes    Alcohol/week: 14.0 standard drinks    Types: 10 Glasses of wine, 4 Shots of liquor per week    Comment: daily since her 20s several a day   . Drug use: Yes    Frequency: 7.0 times per week    Types: Marijuana  . Sexual activity: Yes    Birth control/protection: Post-menopausal, Condom    Comment: 2 partners in last year without condom use

## 2018-12-31 ENCOUNTER — Telehealth: Payer: Self-pay | Admitting: Family Medicine

## 2018-12-31 NOTE — Telephone Encounter (Signed)
Received word from my staff that Ms. Dever would like to quit drinking.  She does have a significant alcohol use daily for over 30 years.  Called her and told her that I wanted to work with her on a safe plan, I informed her that if she had decided to stop cold [place] that she could be in significant danger and should probably consider going to the emergency department.  If she was instead looking to do a taper at home and decrease her drinking by steps at home, that I be willing to try and help her come up with a way to do that.  As we were unable to communicate because she did not pick up I left these basic instructions on the phone and asked her to call me back.  Dr. Criss Rosales

## 2018-12-31 NOTE — Telephone Encounter (Signed)
Pt returns call and lm on nurse line.  She will be available the rest of the afternoon if Dr. Criss Rosales would like to call back. Christen Bame, CMA

## 2019-01-04 ENCOUNTER — Telehealth: Payer: Self-pay | Admitting: Family Medicine

## 2019-01-04 NOTE — Telephone Encounter (Signed)
Tried again to reach patient by phone was unable, voicemail did not pick up.  Was unable to leave a message.  I will ask our nurse team to schedule Carrie [name] with 1 of our physicians so that she can discuss how to safely withdraw from alcohol as she decides she wants to quit.  Dr. Criss Rosales

## 2019-01-05 NOTE — Telephone Encounter (Signed)
LVM to call office back to assist pt in getting an appointment scheduled per Dr. Criss Rosales. April Zimmerman Rumple, CMA

## 2019-01-13 NOTE — Telephone Encounter (Signed)
Contacted pt and scheduled an appointment with Dr. Maudie Mercury. April Zimmerman Rumple, CMA

## 2019-01-18 ENCOUNTER — Ambulatory Visit: Payer: Medicare HMO | Admitting: Family Medicine

## 2019-01-21 ENCOUNTER — Ambulatory Visit: Payer: Medicare HMO

## 2019-02-05 ENCOUNTER — Ambulatory Visit (INDEPENDENT_AMBULATORY_CARE_PROVIDER_SITE_OTHER): Payer: Medicare HMO | Admitting: Orthopaedic Surgery

## 2019-02-05 ENCOUNTER — Encounter: Payer: Self-pay | Admitting: Orthopaedic Surgery

## 2019-02-05 ENCOUNTER — Other Ambulatory Visit: Payer: Self-pay

## 2019-02-05 VITALS — BP 140/85 | HR 80 | Ht 60.0 in | Wt 164.0 lb

## 2019-02-05 DIAGNOSIS — R6889 Other general symptoms and signs: Secondary | ICD-10-CM | POA: Diagnosis not present

## 2019-02-05 DIAGNOSIS — M48062 Spinal stenosis, lumbar region with neurogenic claudication: Secondary | ICD-10-CM | POA: Diagnosis not present

## 2019-02-05 NOTE — Progress Notes (Signed)
Office Visit Note   Patient: Carrie [name]           Date of Birth: Aug 28, 1950           MRN: [id number] Visit Date: 02/05/2019              Requested by: Sherene Sires, DO [address],  [place] [address] PCP: Sherene Sires, DO   Assessment & Plan: Visit Diagnoses:  1. Spinal stenosis of lumbar region with neurogenic claudication     Plan: We will make patient appointment 4 months.  We again discussed gradually decreasing her alcohol consumption and again discussed risks of postop DTs.  She has some anterolisthesis at L5-S1 but is stable on flexion-extension x-rays.  She has some mild to moderate narrowing at the 5 1 level in the foramina but her major problem is moderate to severe stenosis at L3-4 causing her neurogenic claudication.  We will recheck her in 4 months.  She can call when she is ready to proceed.  Follow-Up Instructions: Return in about 4 months (around 06/05/2019).   Orders:  No orders of the defined types were placed in this encounter.  No orders of the defined types were placed in this encounter.     Procedures: No procedures performed   Clinical Data: No additional findings.   Subjective: Chief Complaint  Patient presents with  . Lower Back - Pain, Follow-up  . Left Leg - Pain, Follow-up    HPI [age]-year-old female returns with ongoing chronic low back pain and neurogenic claudication symptoms.  She can walk a half block.  She had a knee injection in September which is helped.  Her back still hurts she has to lean over a grocery cart.  She still drinking vodka states she is cutting it somewhat water but still drinking enough that would be at significant risk for DTs after surgery.  Patient states she lives alone and would have to find someone to stay with after surgery in the postop time.  I discussed with her 5 days would be a likely scenario and after that she probably can go back home on her own.  Patient states she wants to wait until after  Covid resolved before she consider surgery.  Currently she gets relief when she sits also does better when she leans over a grocery cart.  Review of Systems system positive for hypertension, GERD, significant alcohol abuse, knee pain otherwise negative as it pertains HPI.   Objective: Vital Signs: BP 140/85   Pulse 80   Ht 5' (1.524 m)   Wt 164 lb (74.4 kg)   BMI 32.03 kg/m   Physical Exam Constitutional:      Appearance: She is well-developed.  HENT:     Head: Normocephalic.     Right Ear: External ear normal.     Left Ear: External ear normal.  Eyes:     Pupils: Pupils are equal, round, and reactive to light.  Neck:     Thyroid: No thyromegaly.     Trachea: No tracheal deviation.  Cardiovascular:     Rate and Rhythm: Normal rate.  Pulmonary:     Effort: Pulmonary effort is normal.  Abdominal:     Palpations: Abdomen is soft.  Skin:    General: Skin is warm and dry.  Neurological:     Mental Status: She is alert and oriented to person, place, and time.  Psychiatric:        Behavior: Behavior normal.  Ortho Exam reflexes are intact she has normal heel toe gait without limping normal logroll to the hips.  Distal pulses are intact no rash over exposed skin.  Specialty Comments:  No specialty comments available.  Imaging: No results found.   PMFS History: Patient Active Problem List   Diagnosis Date Noted  . Seasonal allergies 11/18/2018  . Dysuria 10/02/2018  . Lipoma of face 09/06/2018  . Bug bite 09/06/2018  . Numbness and tingling of foot 10/14/2017  . Abdominal pain, right upper quadrant 05/16/2017  . Neuropathy 04/17/2017  . Cramping of feet 03/14/2017  . Mild tetrahydrocannabinol (THC) abuse 11/20/2016  . Pain in right foot 09/01/2016  . Impingement syndrome of left shoulder 09/01/2016  . Bilateral sensorineural hearing loss 07/18/2016  . Subjective tinnitus of both ears 07/18/2016  . Spinal stenosis of lumbar region with neurogenic claudication  02/14/2016  . Eustachian tube dysfunction 09/04/2015  . Back pain of thoracolumbar region 05/08/2015  . Epidermoid cyst of skin 01/10/2015  . Muscle cramping 12/05/2014  . Low back pain 06/14/2014  . Preventative health care 03/15/2014  . Diabetes mellitus screening 02/22/2014  . Bilateral knee pain 02/08/2014  . Transaminitis 07/23/2013  . Essential hypertension, benign 07/23/2013  . GERD (gastroesophageal reflux disease) 07/23/2013  . Vitamin D deficiency 07/22/2013  . Breast cancer, left breast ([place]) 07/17/2011  . Steatosis 07/17/2011  . FIBROIDS, UTERUS 01/30/2007  . Alcohol abuse 01/30/2007   Past Medical History:  Diagnosis Date  . Arthritis    "back, right hand" (07/11/2014)  . Breast cancer [facility]) 2011   right breast  . Breast cancer, right breast ([place]) 05/2009  . Chronic lower back pain   . GERD (gastroesophageal reflux disease)   . Hepatic steatosis 07/17/2011  . Hypertension   . Hypokalemia 07/17/2011  . Lower GI bleeding 07/11/2014 hospitalized  . Personal history of radiation therapy 2011    Family History  Problem Relation Age of Onset  . Cancer Other   . Diabetes Mother   . Hypertension Mother   . Diabetes Brother   . Hypertension Father   . Arthritis/Rheumatoid Father   . Cancer Father   . Cancer Maternal Grandfather   . Heart disease Brother     Past Surgical History:  Procedure Laterality Date  . BREAST BIOPSY Right 2011  . BREAST LUMPECTOMY Right 2011  . COLONOSCOPY    . ESOPHAGOGASTRODUODENOSCOPY N/A 07/12/2014   Procedure: ESOPHAGOGASTRODUODENOSCOPY (EGD);  Surgeon: Clarene Essex, MD;  Location[facility] ENDOSCOPY;  Service: Endoscopy;  Laterality: N/A;  . FINGER SURGERY     middle finger on left hand  . MASTECTOMY COMPLETE / SIMPLE W/ SENTINEL NODE BIOPSY  05/2009   Archie Endo 05/23/2009  . TOTAL KNEE ARTHROPLASTY Right 10/23/2016  . TOTAL KNEE ARTHROPLASTY Right 10/23/2016   Procedure: RIGHT TOTAL KNEE ARTHROPLASTY;  Surgeon: [name], MD;  Location: [place];  Service: Orthopedics;  Laterality: Right;   Social History   Occupational History  . Occupation: retired    Comment: housekeeping  Tobacco Use  . Smoking status: Never Smoker  . Smokeless tobacco: Never Used  Substance and Sexual Activity  . Alcohol use: Yes    Alcohol/week: 14.0 standard drinks    Types: 10 Glasses of wine, 4 Shots of liquor per week    Comment: daily since her 20s several a day   . Drug use: Yes    Frequency: 7.0 times per week    Types: Marijuana  . Sexual activity: Yes  Birth control/protection: Post-menopausal, Condom    Comment: 2 partners in last year without condom use

## 2019-02-23 ENCOUNTER — Other Ambulatory Visit: Payer: Self-pay

## 2019-02-23 ENCOUNTER — Ambulatory Visit (INDEPENDENT_AMBULATORY_CARE_PROVIDER_SITE_OTHER): Payer: Medicare HMO | Admitting: Family Medicine

## 2019-02-23 ENCOUNTER — Encounter: Payer: Self-pay | Admitting: Family Medicine

## 2019-02-23 VITALS — BP 124/72 | HR 92 | Wt 160.0 lb

## 2019-02-23 DIAGNOSIS — Z23 Encounter for immunization: Secondary | ICD-10-CM

## 2019-02-23 DIAGNOSIS — F1010 Alcohol abuse, uncomplicated: Secondary | ICD-10-CM | POA: Diagnosis not present

## 2019-02-23 DIAGNOSIS — R6889 Other general symptoms and signs: Secondary | ICD-10-CM | POA: Diagnosis not present

## 2019-02-23 MED ORDER — NALTREXONE HCL 50 MG PO TABS: 50.0000 mg | ORAL_TABLET | Freq: Every day | ORAL | 0 refills | Status: DC

## 2019-02-25 DIAGNOSIS — Z23 Encounter for immunization: Secondary | ICD-10-CM | POA: Insufficient documentation

## 2019-02-25 MED ORDER — CHLORDIAZEPOXIDE HCL 25 MG PO CAPS: 25.0000 mg | ORAL_CAPSULE | Freq: Three times a day (TID) | ORAL | 0 refills | Status: DC

## 2019-02-25 MED ORDER — CHLORDIAZEPOXIDE HCL 25 MG PO CAPS: 25.0000 mg | ORAL_CAPSULE | Freq: Every day | ORAL | 0 refills | Status: DC

## 2019-02-25 MED ORDER — CHLORDIAZEPOXIDE HCL 25 MG PO CAPS: 25.0000 mg | ORAL_CAPSULE | Freq: Two times a day (BID) | ORAL | 0 refills | Status: DC

## 2019-02-25 MED ORDER — CHLORDIAZEPOXIDE HCL 25 MG PO CAPS: 25.0000 mg | ORAL_CAPSULE | Freq: Four times a day (QID) | ORAL | 0 refills | Status: DC

## 2019-02-25 NOTE — Assessment & Plan Note (Signed)
Plan is to quick first week of January as patient does not think she can during the holidays.  Will start naltrexone now with goal of decreasing intake during the holidays then have librium taper delivered to her daily by [facility] for the first week of January  Jan4: Librium 25-50 every 6 hours Jan5 Librium 25-50 every 8 hours Jan6 Librium 25-50 every 12 hours Jan7 Librium 25-50 at bedtime Jan8 Librium 25 at bedtime

## 2019-02-25 NOTE — Progress Notes (Signed)
    Subjective:  Carrie [name] is a 68 y.o. female who presents to the [facility] today with a chief complaint of alcohol cessation plan.   HPI: Patient comes in to discuss the plan for her alcohol cessation.  She has a significant use of liquor which would likely result in seizures where she to stop cold [place].  She does not think she can slowly ramp down.  She does not want to be admitted and so we will do our best to support her with the safest plan we can at home.  Objective:  Physical Exam: BP 124/72   Pulse 92   Wt 160 lb (72.6 kg)   SpO2 98%   BMI 31.25 kg/m   Gen: NAD, conversing comfortably, pleasant  CV: Regular heart rate Pulm: No increased work of breathing, no cough MSK: no edema, cyanosis, or clubbing noted Skin: warm, dry Neuro: grossly normal, moves all extremities Psych: Normal affect and thought content  No results found for this or any previous visit (from the past 72 hour(s)).   Assessment/Plan:  Alcohol abuse Plan is to quick first week of January as patient does not think she can during the holidays.  Will start naltrexone now with goal of decreasing intake during the holidays then have librium taper delivered to her daily by [facility] for the first week of January  Jan4: Librium 25-50 every 6 hours Jan5 Librium 25-50 every 8 hours Jan6 Librium 25-50 every 12 hours Jan7 Librium 25-50 at bedtime Jan8 Librium 25 at bedtime   Sherene Sires, [place] - PGY3 02/25/2019 3:11 PM

## 2019-03-01 ENCOUNTER — Telehealth: Payer: Self-pay

## 2019-03-01 NOTE — Telephone Encounter (Signed)
LVM for a return call from our office. Ottis Stain, CMA

## 2019-03-01 NOTE — Telephone Encounter (Signed)
-----   Message from Sherene Sires, DO sent at 02/25/2019  3:15 PM EST ----- Please call patient and tell her I have the medication ordered for her to try and stop drinking on Jan 4.  [facility] will deliver daily for a week.  The doses will be on the pill bottles but I printed out a sheet with the whole plan on it and will leave it at the front desk for her to pick up.  Tell her I'm rooting for her.

## 2019-03-04 NOTE — Telephone Encounter (Signed)
Pt informed of below.April Zimmerman Rumple, CMA ? ?

## 2019-03-19 ENCOUNTER — Other Ambulatory Visit: Payer: Self-pay | Admitting: Family Medicine

## 2019-03-19 DIAGNOSIS — L708 Other acne: Secondary | ICD-10-CM | POA: Diagnosis not present

## 2019-03-19 DIAGNOSIS — I10 Essential (primary) hypertension: Secondary | ICD-10-CM

## 2019-03-19 DIAGNOSIS — R6889 Other general symptoms and signs: Secondary | ICD-10-CM | POA: Diagnosis not present

## 2019-03-19 DIAGNOSIS — L821 Other seborrheic keratosis: Secondary | ICD-10-CM | POA: Diagnosis not present

## 2019-03-19 DIAGNOSIS — L720 Epidermal cyst: Secondary | ICD-10-CM | POA: Diagnosis not present

## 2019-05-06 DIAGNOSIS — Z1159 Encounter for screening for other viral diseases: Secondary | ICD-10-CM | POA: Diagnosis not present

## 2019-05-11 DIAGNOSIS — K621 Rectal polyp: Secondary | ICD-10-CM | POA: Diagnosis not present

## 2019-05-11 DIAGNOSIS — Z86010 Personal history of colonic polyps: Secondary | ICD-10-CM | POA: Diagnosis not present

## 2019-05-11 DIAGNOSIS — K635 Polyp of colon: Secondary | ICD-10-CM | POA: Diagnosis not present

## 2019-05-11 DIAGNOSIS — K640 First degree hemorrhoids: Secondary | ICD-10-CM | POA: Diagnosis not present

## 2019-05-11 DIAGNOSIS — K5730 Diverticulosis of large intestine without perforation or abscess without bleeding: Secondary | ICD-10-CM | POA: Diagnosis not present

## 2019-05-14 DIAGNOSIS — K635 Polyp of colon: Secondary | ICD-10-CM | POA: Diagnosis not present

## 2019-05-14 DIAGNOSIS — K621 Rectal polyp: Secondary | ICD-10-CM | POA: Diagnosis not present

## 2019-06-04 ENCOUNTER — Other Ambulatory Visit: Payer: Self-pay

## 2019-06-04 ENCOUNTER — Ambulatory Visit (INDEPENDENT_AMBULATORY_CARE_PROVIDER_SITE_OTHER): Payer: Medicare HMO | Admitting: Orthopaedic Surgery

## 2019-06-04 ENCOUNTER — Encounter: Payer: Self-pay | Admitting: Orthopaedic Surgery

## 2019-06-04 VITALS — BP 125/81 | HR 79 | Ht 60.0 in | Wt 158.0 lb

## 2019-06-04 DIAGNOSIS — M48062 Spinal stenosis, lumbar region with neurogenic claudication: Secondary | ICD-10-CM | POA: Diagnosis not present

## 2019-06-04 NOTE — Progress Notes (Signed)
Office Visit Note   Patient: Carrie [name]           Date of Birth: 1950-04-26           MRN: [id number] Visit Date: 06/04/2019              Requested by: Sherene Sires, DO [address],  [place] [address] PCP: Sherene Sires, DO   Assessment & Plan: Visit Diagnoses:  1. Spinal stenosis of lumbar region with neurogenic claudication     Plan: Patient's symptoms have progressed with more numbness tingling in her legs problems with prolonged standing and walking.  She has been treated with anti-inflammatories home exercise program probably Tylenol.  She like to consider operative intervention and will obtain MRI scan lumbar since she not had imaging studies done in several years.  Also follow-up after MRI for review.  Follow-Up Instructions: No follow-ups on file.   Orders:  Orders Placed This Encounter  Procedures  . MR Lumbar Spine w/o contrast   No orders of the defined types were placed in this encounter.     Procedures: No procedures performed   Clinical Data: No additional findings.   Subjective: Chief Complaint  Patient presents with  . Lower Back - Follow-up  . Left Leg - Follow-up  . Left Knee - Pain    HPI 69 year old female returns with ongoing problems with low back pain and bilateral foot numbness which is progressed.  States she has pain in her back that radiates down to her knees with more knee pain on the left than right.  She has had previous right knee surgery.  She had injections in her knee in September and she states this really did not help.  Patient's had moderate severe stenosis at L3-4 with neurogenic claudication symptoms.  Since symptoms have been present now for more than a year.  She continues with high alcohol intake.  Review of Systems is systems positive for hypertension GERD significant alcohol abuse and knee pain as well as low back pain otherwise negative as pertains HPI.   Objective: Vital Signs: BP 125/81   Pulse 79   Ht  5' (1.524 m)   Wt 158 lb (71.7 kg)   BMI 30.86 kg/m   Physical Exam Constitutional:      Appearance: She is well-developed.  HENT:     Head: Normocephalic.     Right Ear: External ear normal.     Left Ear: External ear normal.  Eyes:     Pupils: Pupils are equal, round, and reactive to light.  Neck:     Thyroid: No thyromegaly.     Trachea: No tracheal deviation.  Cardiovascular:     Rate and Rhythm: Normal rate.  Pulmonary:     Effort: Pulmonary effort is normal.  Abdominal:     Palpations: Abdomen is soft.  Skin:    General: Skin is warm and dry.  Neurological:     Mental Status: She is alert and oriented to person, place, and time.  Psychiatric:        Behavior: Behavior normal.     Ortho Exam patient can ambulate goes from sitting to standing position amatory without a limp.  Knee and ankle jerk are intact.  Well-healed right midline total knee arthroplasty incision.  Left knee has crepitus more medial and the lateral joint line tenderness distal pulses are intact.  Specialty Comments:  No specialty comments available.  Imaging: No results found.   PMFS History: Patient  Active Problem List   Diagnosis Date Noted  . Need for immunization against influenza 02/25/2019  . Seasonal allergies 11/18/2018  . Dysuria 10/02/2018  . Lipoma of face 09/06/2018  . Bug bite 09/06/2018  . Numbness and tingling of foot 10/14/2017  . Abdominal pain, right upper quadrant 05/16/2017  . Neuropathy 04/17/2017  . Cramping of feet 03/14/2017  . Mild tetrahydrocannabinol (THC) abuse 11/20/2016  . Pain in right foot 09/01/2016  . Impingement syndrome of left shoulder 09/01/2016  . Bilateral sensorineural hearing loss 07/18/2016  . Subjective tinnitus of both ears 07/18/2016  . Spinal stenosis of lumbar region 02/14/2016  . Eustachian tube dysfunction 09/04/2015  . Back pain of thoracolumbar region 05/08/2015  . Epidermoid cyst of skin 01/10/2015  . Muscle cramping 12/05/2014    . Low back pain 06/14/2014  . Preventative health care 03/15/2014  . Diabetes mellitus screening 02/22/2014  . Bilateral knee pain 02/08/2014  . Transaminitis 07/23/2013  . Essential hypertension, benign 07/23/2013  . GERD (gastroesophageal reflux disease) 07/23/2013  . Vitamin D deficiency 07/22/2013  . Breast cancer, left breast ([place]) 07/17/2011  . Steatosis 07/17/2011  . FIBROIDS, UTERUS 01/30/2007  . Alcohol abuse 01/30/2007   Past Medical History:  Diagnosis Date  . Arthritis    "back, right hand" (07/11/2014)  . Breast cancer [facility]) 2011   right breast  . Breast cancer, right breast ([place]) 05/2009  . Chronic lower back pain   . GERD (gastroesophageal reflux disease)   . Hepatic steatosis 07/17/2011  . Hypertension   . Hypokalemia 07/17/2011  . Lower GI bleeding 07/11/2014 hospitalized  . Personal history of radiation therapy 2011    Family History  Problem Relation Age of Onset  . Cancer Other   . Diabetes Mother   . Hypertension Mother   . Diabetes Brother   . Hypertension Father   . Arthritis/Rheumatoid Father   . Cancer Father   . Cancer Maternal Grandfather   . Heart disease Brother     Past Surgical History:  Procedure Laterality Date  . BREAST BIOPSY Right 2011  . BREAST LUMPECTOMY Right 2011  . COLONOSCOPY    . ESOPHAGOGASTRODUODENOSCOPY N/A 07/12/2014   Procedure: ESOPHAGOGASTRODUODENOSCOPY (EGD);  Surgeon: Clarene Essex, MD;  Location: [facility] ENDOSCOPY;  Service: Endoscopy;  Laterality: N/A;  . FINGER SURGERY     middle finger on left hand  . MASTECTOMY COMPLETE / SIMPLE W/ SENTINEL NODE BIOPSY  05/2009   Archie Endo 05/23/2009  . TOTAL KNEE ARTHROPLASTY Right 10/23/2016  . TOTAL KNEE ARTHROPLASTY Right 10/23/2016   Procedure: RIGHT TOTAL KNEE ARTHROPLASTY;  Surgeon: [name], MD;  Location: [facility];  Service: Orthopedics;  Laterality: Right;   Social History   Occupational History  . Occupation: retired    Comment: housekeeping  Tobacco Use  . Smoking  status: Never Smoker  . Smokeless tobacco: Never Used  Substance and Sexual Activity  . Alcohol use: Yes    Alcohol/week: 14.0 standard drinks    Types: 10 Glasses of wine, 4 Shots of liquor per week    Comment: daily since her 20s several a day   . Drug use: Yes    Frequency: 7.0 times per week    Types: Marijuana  . Sexual activity: Yes    Birth control/protection: Post-menopausal, Condom    Comment: 2 partners in last year without condom use

## 2019-06-10 ENCOUNTER — Other Ambulatory Visit: Payer: Self-pay

## 2019-06-10 ENCOUNTER — Ambulatory Visit: Payer: Medicare HMO | Attending: Internal Medicine

## 2019-06-10 DIAGNOSIS — Z23 Encounter for immunization: Secondary | ICD-10-CM

## 2019-06-10 NOTE — Progress Notes (Signed)
   Covid-19 Vaccination Clinic  Name:  Carrie [name]    MRN: [id number] DOB: [birth year]  06/10/2019  Carrie [name] was observed post Covid-19 immunization for 15 minutes without incident. She was provided with Vaccine Information Sheet and instruction to access the V-Safe system.   Carrie [name] was instructed to call 911 with any severe reactions post vaccine: Marland Kitchen Difficulty breathing  . Swelling of face and throat  . A fast heartbeat  . A bad rash all over body  . Dizziness and weakness   Immunizations Administered    Name Date Dose VIS Date Route   Pfizer COVID-19 Vaccine 06/10/2019 11:19 AM 0.3 mL 03/12/2019 Intramuscular   Manufacturer: [place]   Lot: [id number]   [place]: [id number]

## 2019-06-11 ENCOUNTER — Telehealth: Payer: Self-pay | Admitting: *Deleted

## 2019-06-11 NOTE — Telephone Encounter (Signed)
Patient states she received her 1st dose of covid vaccine on yesterday and was not given a date to come back for 2nd dose. Pt advised per chart the 2nd dose was scheduled on 07/05/19 at 11 am. Pt verbalized understanding.

## 2019-06-16 ENCOUNTER — Other Ambulatory Visit: Payer: Self-pay

## 2019-07-05 ENCOUNTER — Ambulatory Visit: Payer: Medicare HMO | Attending: Internal Medicine

## 2019-07-05 DIAGNOSIS — Z23 Encounter for immunization: Secondary | ICD-10-CM

## 2019-07-05 NOTE — Progress Notes (Signed)
   Covid-19 Vaccination Clinic  Name:  Carrie [name]    MRN: [id number] DOB: Mar 20, 1951  07/05/2019  [name] was observed post Covid-19 immunization for 15 minutes without incident. She was provided with Vaccine Information Sheet and instruction to access the V-Safe system.   [name] was instructed to call 911 with any severe reactions post vaccine: Marland Kitchen Difficulty breathing  . Swelling of face and throat  . A fast heartbeat  . A bad rash all over body  . Dizziness and weakness   Immunizations Administered    Name Date Dose VIS Date Route   Pfizer COVID-19 Vaccine 07/05/2019 10:43 AM 0.3 mL 03/12/2019 Intramuscular   Manufacturer: [place]   Lot: [id number]   [place]: [id number]

## 2019-07-07 ENCOUNTER — Telehealth: Payer: Self-pay | Admitting: Orthopaedic Surgery

## 2019-07-07 MED ORDER — DIAZEPAM 5 MG PO TABS: ORAL_TABLET | ORAL | 0 refills | Status: DC

## 2019-07-07 NOTE — Telephone Encounter (Signed)
Patient called requesting volume for upcoming MRI on 07/09/19. Patient states she is claustrophobic and need something for her nerves. Patient phone number is [phone]

## 2019-07-07 NOTE — Telephone Encounter (Signed)
Ok for valium? 

## 2019-07-07 NOTE — Telephone Encounter (Signed)
Rx called to pharmacy. I called patient and advised.

## 2019-07-07 NOTE — Telephone Encounter (Signed)
Ok thanks 

## 2019-07-09 ENCOUNTER — Other Ambulatory Visit: Payer: Self-pay

## 2019-07-09 ENCOUNTER — Ambulatory Visit
Admission: RE | Admit: 2019-07-09 | Discharge: 2019-07-09 | Disposition: A | Discharge status: Home / Self Care | Payer: Medicaid Other | Source: Ambulatory Visit | Attending: Orthopaedic Surgery | Admitting: Orthopaedic Surgery

## 2019-07-09 DIAGNOSIS — M48061 Spinal stenosis, lumbar region without neurogenic claudication: Secondary | ICD-10-CM | POA: Diagnosis not present

## 2019-07-09 DIAGNOSIS — M48062 Spinal stenosis, lumbar region with neurogenic claudication: Secondary | ICD-10-CM

## 2019-07-12 ENCOUNTER — Other Ambulatory Visit: Payer: Self-pay | Admitting: Family Medicine

## 2019-07-12 DIAGNOSIS — Z1231 Encounter for screening mammogram for malignant neoplasm of breast: Secondary | ICD-10-CM

## 2019-07-13 ENCOUNTER — Ambulatory Visit: Payer: Medicaid Other | Admitting: Orthopaedic Surgery

## 2019-07-20 ENCOUNTER — Ambulatory Visit: Payer: Medicaid Other | Admitting: Orthopaedic Surgery

## 2019-07-27 ENCOUNTER — Ambulatory Visit: Payer: Medicaid Other | Admitting: Orthopaedic Surgery

## 2019-08-03 ENCOUNTER — Other Ambulatory Visit: Payer: Self-pay

## 2019-08-03 ENCOUNTER — Encounter: Payer: Self-pay | Admitting: Orthopaedic Surgery

## 2019-08-03 ENCOUNTER — Ambulatory Visit (INDEPENDENT_AMBULATORY_CARE_PROVIDER_SITE_OTHER): Payer: Medicare HMO | Admitting: Orthopaedic Surgery

## 2019-08-03 VITALS — BP 109/75 | HR 103 | Ht 60.0 in | Wt 147.0 lb

## 2019-08-03 DIAGNOSIS — N3289 Other specified disorders of bladder: Secondary | ICD-10-CM | POA: Diagnosis not present

## 2019-08-03 DIAGNOSIS — M48062 Spinal stenosis, lumbar region with neurogenic claudication: Secondary | ICD-10-CM | POA: Diagnosis not present

## 2019-08-03 DIAGNOSIS — G629 Polyneuropathy, unspecified: Secondary | ICD-10-CM | POA: Diagnosis not present

## 2019-08-03 NOTE — Progress Notes (Signed)
Office Visit Note   Patient: Carrie [name]           Date of Birth: 1950/07/25           MRN: [id number] Visit Date: 08/03/2019              Requested by: Sherene Sires, DO [address],  [place] [address] PCP: Sherene Sires, DO   Assessment & Plan: Visit Diagnoses:  1. Bladder distention   2. Spinal stenosis of lumbar region with neurogenic claudication   3. Neuropathy     Plan: We will make urology referral for evaluation of bladder distention.  Can follow-up in 4 weeks.  Follow-Up Instructions: Return in about 4 weeks (around 08/31/2019).   Orders:  Orders Placed This Encounter  Procedures  . Ambulatory referral to Urology   No orders of the defined types were placed in this encounter.     Procedures: No procedures performed   Clinical Data: No additional findings.   Subjective: Chief Complaint  Patient presents with  . Lower Back - Pain, Follow-up    MRI Lumbar Review     HPI 69 year old female returns post MRI scan lumbar for evaluation of progression to L3-4 spinal stenosis.  Scan date was 07/10/2019 and distended bladder was noted which is also present on previous CT myelogram and radiologist is questioning possible urinary retention.  Patient still has problems with claudication has to lean over a grocery cart she states she can stand for 15 minutes.  Leaning on a cart she can go through the entire store.  Past history of heavy drinking, not presently.  Patient states she voids moderate amount does not really have hesitancy frequency or leakage.  She is not seen the urologist denies problems with recurrent UTIs.  Review of Systems review of systems updated unchanged from previous office visit other than as mentioned in HPI.   Objective: Vital Signs: BP 109/75   Pulse (!) 103   Ht 5' (1.524 m)   Wt 147 lb (66.7 kg)   BMI 28.71 kg/m   Physical Exam Constitutional:      Appearance: She is well-developed.  HENT:     Head: Normocephalic.   Right Ear: External ear normal.     Left Ear: External ear normal.  Eyes:     Pupils: Pupils are equal, round, and reactive to light.  Neck:     Thyroid: No thyromegaly.     Trachea: No tracheal deviation.  Cardiovascular:     Rate and Rhythm: Normal rate.  Pulmonary:     Effort: Pulmonary effort is normal.  Abdominal:     Palpations: Abdomen is soft.  Skin:    General: Skin is warm and dry.  Neurological:     Mental Status: She is alert and oriented to person, place, and time.  Psychiatric:        Behavior: Behavior normal.     Ortho Exam patient has intact anterior tib EHL gastrocsoleus.  Well-healed right total knee arthroplasty incision.  No knee effusion.  Some crepitus with left knee range of motion.  Distal pulses palpable.  Specialty Comments:  No specialty comments available.  Imaging: CLINICAL DATA:  69 year old female with low back pain radiating to both legs and feet, progressive for several months with no known injury.  EXAM: MRI LUMBAR SPINE WITHOUT CONTRAST  TECHNIQUE: Multiplanar, multisequence MR imaging of the lumbar spine was performed. No intravenous contrast was administered.  COMPARISON:  Lumbar MRI 02/29/2016. Lumbar CT  myelogram 06/25/2017.  FINDINGS: Segmentation: Transitional anatomy as detailed on the prior CT myelogram. The same numbering system will be used as on the comparison status in ating the lowest open disc space L5-S1. Correlation with radiographs is recommended prior to any operative intervention.  Alignment: Grade 1 anterolisthesis of L5 on S1 measures 5-6 mm and is stable since 2017. Mild dextroconvex lumbar scoliosis. Preserved lordosis otherwise.  Vertebrae: Chronic L1 inferior endplate deformity. Chronic degenerative endplate marrow signal changes are pronounced also at L3-L4. No marrow edema or evidence of acute osseous abnormality. Background bone marrow signal is within normal limits. Intact visible sacrum and  SI joints.  Conus medullaris and cauda equina: Conus extends to the L1 level. No lower spinal cord or conus signal abnormality.  Paraspinal and other soft tissues: The urinary bladder is distended today and on the prior studies (series 3, image 18). Visualized abdominal viscera and paraspinal soft tissues are within normal limits.  Disc levels:  Widespread mild lower thoracic spinal stenosis related to disc bulging and posterior element hypertrophy (series 2, image 8), stable since 2017. Mild if any lower thoracic spinal cord mass effect and no cord signal abnormality.  T12-L1:  Mild to moderate facet degeneration without stenosis.  L1-L2: Chronic circumferential disc bulge and posterior element hypertrophy with mild bilateral L1 foraminal stenosis. This level stable.  L2-L3: Far lateral disc bulging and mild posterior element hypertrophy. Chronic epidural lipomatosis which is largely responsible for mild to moderate spinal stenosis here which seems increased from the prior studies (series 5, image 14). Mild L2 foraminal stenosis is stable.  L3-L4: Advanced chronic disc and endplate degeneration with circumferential disc osteophyte complex, moderate to severe posterior element hypertrophy greater on the right. Chronic epidural lipomatosis. Moderate to severe spinal stenosis appears increased (series 5, image 19). Moderate to severe right greater than left L3 foraminal stenosis is stable.  L4-L5: Chronic circumferential disc bulge and moderate to severe facet hypertrophy which is greater on the left. However, evidence of developing left side facet ankylosis now (series 5, image 23). Mild right L4 foraminal stenosis is stable.  L5-S1: Chronic anterolisthesis with circumferential disc bulge and severe facet hypertrophy. Chronic facet joint fluid, although decreased on the left. Questionable developing left facet joint ankylosis now. Moderate to severe left and  moderate right L5 foraminal stenosis is stable.  IMPRESSION: 1. Transitional anatomy. The same numbering system will be used as on the comparison status status post at the lowest open disc space L5-S1. Correlation with radiographs is recommended prior to any operative intervention.  2. Chronically advanced lumbar spine degeneration. No acute osseous abnormality identified, but evidence of developing ankylosis at chronically degenerated left side L4-L5 and L5-S1 facets.  3. Progressed multifactorial spinal stenosis at L2-L3 and L3-L4 since 2019, with significant contribution from epidural lipomatosis.  4. Stable lumbar stenosis and neural impingement elsewhere.  5. Chronically distended urinary bladder.  Query urinary retention.   Electronically Signed   By: Genevie Ann M.D.   On: 07/10/2019 19:40   PMFS History: Patient Active Problem List   Diagnosis Date Noted  . Need for immunization against influenza 02/25/2019  . Seasonal allergies 11/18/2018  . Dysuria 10/02/2018  . Lipoma of face 09/06/2018  . Bug bite 09/06/2018  . Numbness and tingling of foot 10/14/2017  . Abdominal pain, right upper quadrant 05/16/2017  . Neuropathy 04/17/2017  . Cramping of feet 03/14/2017  . Mild tetrahydrocannabinol (THC) abuse 11/20/2016  . Pain in right foot 09/01/2016  . Impingement syndrome  of left shoulder 09/01/2016  . Bilateral sensorineural hearing loss 07/18/2016  . Subjective tinnitus of both ears 07/18/2016  . Spinal stenosis of lumbar region 02/14/2016  . Eustachian tube dysfunction 09/04/2015  . Back pain of thoracolumbar region 05/08/2015  . Epidermoid cyst of skin 01/10/2015  . Muscle cramping 12/05/2014  . Low back pain 06/14/2014  . Preventative health care 03/15/2014  . Diabetes mellitus screening 02/22/2014  . Bilateral knee pain 02/08/2014  . Transaminitis 07/23/2013  . Essential hypertension, benign 07/23/2013  . GERD (gastroesophageal reflux disease)  07/23/2013  . Vitamin D deficiency 07/22/2013  . Breast cancer, left breast ([place]) 07/17/2011  . Steatosis 07/17/2011  . FIBROIDS, UTERUS 01/30/2007  . Alcohol abuse 01/30/2007   Past Medical History:  Diagnosis Date  . Arthritis    "back, right hand" (07/11/2014)  . Breast cancer [facility]) 2011   right breast  . Breast cancer, right breast ([place]) 05/2009  . Chronic lower back pain   . GERD (gastroesophageal reflux disease)   . Hepatic steatosis 07/17/2011  . Hypertension   . Hypokalemia 07/17/2011  . Lower GI bleeding 07/11/2014 hospitalized  . Personal history of radiation therapy 2011    Family History  Problem Relation Age of Onset  . Cancer Other   . Diabetes Mother   . Hypertension Mother   . Diabetes Brother   . Hypertension Father   . Arthritis/Rheumatoid Father   . Cancer Father   . Cancer Maternal Grandfather   . Heart disease Brother     Past Surgical History:  Procedure Laterality Date  . BREAST BIOPSY Right 2011  . BREAST LUMPECTOMY Right 2011  . COLONOSCOPY    . ESOPHAGOGASTRODUODENOSCOPY N/A 07/12/2014   Procedure: ESOPHAGOGASTRODUODENOSCOPY (EGD);  Surgeon: Clarene Essex, MD;  Location[facility] ENDOSCOPY;  Service: Endoscopy;  Laterality: N/A;  . FINGER SURGERY     middle finger on left hand  . MASTECTOMY COMPLETE / SIMPLE W/ SENTINEL NODE BIOPSY  05/2009   Archie Endo 05/23/2009  . TOTAL KNEE ARTHROPLASTY Right 10/23/2016  . TOTAL KNEE ARTHROPLASTY Right 10/23/2016   Procedure: RIGHT TOTAL KNEE ARTHROPLASTY;  Surgeon: [name], MD;  Location: [facility];  Service: Orthopedics;  Laterality: Right;   Social History   Occupational History  . Occupation: retired    Comment: housekeeping  Tobacco Use  . Smoking status: Never Smoker  . Smokeless tobacco: Never Used  Substance and Sexual Activity  . Alcohol use: Yes    Alcohol/week: 14.0 standard drinks    Types: 10 Glasses of wine, 4 Shots of liquor per week    Comment: daily since her 20s several a day   . Drug use: Yes      Frequency: 7.0 times per week    Types: Marijuana  . Sexual activity: Yes    Birth control/protection: Post-menopausal, Condom    Comment: 2 partners in last year without condom use

## 2019-08-11 ENCOUNTER — Other Ambulatory Visit: Payer: Self-pay | Admitting: *Deleted

## 2019-08-11 DIAGNOSIS — I10 Essential (primary) hypertension: Secondary | ICD-10-CM

## 2019-08-11 DIAGNOSIS — G629 Polyneuropathy, unspecified: Secondary | ICD-10-CM

## 2019-08-12 MED ORDER — AMLODIPINE BESYLATE 10 MG PO TABS: 10.0000 mg | ORAL_TABLET | Freq: Every day | ORAL | 3 refills | Status: DC

## 2019-08-12 MED ORDER — PANTOPRAZOLE SODIUM 20 MG PO TBEC: 20.0000 mg | DELAYED_RELEASE_TABLET | Freq: Every day | ORAL | 2 refills | Status: DC

## 2019-08-12 MED ORDER — GABAPENTIN 100 MG PO CAPS: 200.0000 mg | ORAL_CAPSULE | Freq: Three times a day (TID) | ORAL | 0 refills | Status: DC

## 2019-08-13 ENCOUNTER — Other Ambulatory Visit: Payer: Self-pay | Admitting: Family Medicine

## 2019-08-16 ENCOUNTER — Ambulatory Visit: Payer: Medicaid Other

## 2019-08-17 ENCOUNTER — Other Ambulatory Visit: Payer: Self-pay

## 2019-08-17 ENCOUNTER — Ambulatory Visit
Admission: RE | Admit: 2019-08-17 | Discharge: 2019-08-17 | Disposition: A | Discharge status: Home / Self Care | Payer: Medicaid Other | Source: Ambulatory Visit | Attending: Family Medicine | Admitting: Family Medicine

## 2019-08-17 DIAGNOSIS — Z1231 Encounter for screening mammogram for malignant neoplasm of breast: Secondary | ICD-10-CM

## 2019-08-19 ENCOUNTER — Other Ambulatory Visit: Payer: Self-pay | Admitting: Family Medicine

## 2019-08-19 DIAGNOSIS — R928 Other abnormal and inconclusive findings on diagnostic imaging of breast: Secondary | ICD-10-CM

## 2019-08-31 ENCOUNTER — Ambulatory Visit: Payer: Medicaid Other | Admitting: Orthopaedic Surgery

## 2019-09-13 ENCOUNTER — Other Ambulatory Visit: Payer: Self-pay | Admitting: Family Medicine

## 2019-09-13 DIAGNOSIS — F1010 Alcohol abuse, uncomplicated: Secondary | ICD-10-CM

## 2019-09-13 DIAGNOSIS — G629 Polyneuropathy, unspecified: Secondary | ICD-10-CM

## 2019-09-13 DIAGNOSIS — E876 Hypokalemia: Secondary | ICD-10-CM

## 2019-09-22 ENCOUNTER — Ambulatory Visit (HOSPITAL_COMMUNITY): Payer: Medicare HMO

## 2019-09-22 ENCOUNTER — Other Ambulatory Visit: Payer: Self-pay

## 2019-09-22 ENCOUNTER — Ambulatory Visit (INDEPENDENT_AMBULATORY_CARE_PROVIDER_SITE_OTHER): Payer: Medicare HMO | Admitting: Family Medicine

## 2019-09-22 VITALS — BP 120/70 | HR 77 | Ht 61.3 in | Wt 151.6 lb

## 2019-09-22 DIAGNOSIS — M79604 Pain in right leg: Secondary | ICD-10-CM

## 2019-09-22 MED ORDER — NAPROXEN 500 MG PO TABS: 500.0000 mg | ORAL_TABLET | Freq: Two times a day (BID) | ORAL | 0 refills | Status: DC

## 2019-09-22 NOTE — Patient Instructions (Signed)
It was great to meet you today! Thank you for letting me participate in your care!  Today, we discussed your right leg swelling, pain, and tenderness. I am concerned about you having a Deep Vein Thrombosis or a blood clot in your leg. I have ordered a study to make sure that you do not have a blood clot so please go to [facility] Radiology department to have that done as soon as possible. If it is negative for a blood clot I will treat you to cover an infection.  Be well, Harolyn Rutherford, DO PGY-3, [facility]

## 2019-09-22 NOTE — Progress Notes (Signed)
    SUBJECTIVE:   CHIEF COMPLAINT / HPI:   Fall/Subsequent left arm and rib pain Patient states she was walking yesterday and stepped into a hole by accident and fell. She has left sided rib pain, left arm pain. It hurts when she takes a deep breath, coughs, or laughs and is tender to touch on her left ribs. No bruising and no difficulty breathing, no chest pain.  Right Leg swelling Patient states yesterday she was sitting on her porch outside and thinks she was bitten by a bug. She does not remember the bite and did not ever notice a bug on her. However, after she came inside she states her leg started to hurt, swell, and has now become red and swollen. It is hard to touch. It is tender when touched. She denies fever, chills, nausea, or vomiting. I did instruct patient I was concerned about a possible blood clot, that this could be life threatening, and the need for imaging today.   PERTINENT  PMH / PSH: HTN, GERD, Neuropathy  OBJECTIVE:   BP 120/70   Pulse 77   Ht 5' 1.3" (1.557 m)   Wt 151 lb 9.6 oz (68.8 kg)   SpO2 96%   BMI 28.37 kg/m   Gen: NAD Cardiac: RRR, no murmurs Resp: CTAB MSK: right leg at the calf is warm, erythematous, tender, and is hard to touch. Positive squeeze test.  ASSESSMENT/PLAN:   Right leg pain Concern for DVT vs cellulitis. If U/S is negative will send ABX Keflex and treat for 7 days. - Venous U/S of right lower extremity today. Appt made at [facility] Radiology Department at 1:30pm (earliest available). - Of note patient had capacity and understood what I was concerned about and could explain why I was concerned. She showed reluctance to go and was worried about finding a ride and I did offer clinic help to arrange transportation but she declined.  Left Rib Pain - Most likely a bruise but could be a fracture. Offered x-rays but patient declines at this time.  - Naproxen 500mg  BID for pain    Nuala Alpha, DO [place]

## 2019-09-22 NOTE — Assessment & Plan Note (Signed)
Concern for DVT vs cellulitis. If U/S is negative will send ABX Keflex and treat for 7 days. - Venous U/S of right lower extremity today. Appt made at [facility] Radiology Department at 1:30pm (earliest available). - Of note patient had capacity and understood what I was concerned about and could explain why I was concerned. She showed reluctance to go and was worried about finding a ride and I did offer clinic help to arrange transportation but she declined.

## 2019-09-23 ENCOUNTER — Encounter (HOSPITAL_COMMUNITY): Payer: Self-pay | Admitting: Family Medicine

## 2019-09-23 ENCOUNTER — Ambulatory Visit (HOSPITAL_COMMUNITY): Payer: Medicare HMO

## 2019-10-07 DIAGNOSIS — R6889 Other general symptoms and signs: Secondary | ICD-10-CM | POA: Diagnosis not present

## 2019-10-07 DIAGNOSIS — R350 Frequency of micturition: Secondary | ICD-10-CM | POA: Diagnosis not present

## 2019-10-07 DIAGNOSIS — N318 Other neuromuscular dysfunction of bladder: Secondary | ICD-10-CM | POA: Diagnosis not present

## 2019-10-12 ENCOUNTER — Ambulatory Visit: Payer: Medicaid Other | Admitting: Orthopaedic Surgery

## 2019-10-29 ENCOUNTER — Ambulatory Visit
Admission: RE | Admit: 2019-10-29 | Discharge: 2019-10-29 | Disposition: A | Discharge status: Home / Self Care | Payer: Medicare HMO | Source: Ambulatory Visit | Attending: Family Medicine | Admitting: Family Medicine

## 2019-10-29 ENCOUNTER — Other Ambulatory Visit: Payer: Self-pay

## 2019-10-29 DIAGNOSIS — R928 Other abnormal and inconclusive findings on diagnostic imaging of breast: Secondary | ICD-10-CM

## 2019-10-29 DIAGNOSIS — R6889 Other general symptoms and signs: Secondary | ICD-10-CM | POA: Diagnosis not present

## 2019-10-29 DIAGNOSIS — R921 Mammographic calcification found on diagnostic imaging of breast: Secondary | ICD-10-CM | POA: Diagnosis not present

## 2019-11-03 DIAGNOSIS — R6889 Other general symptoms and signs: Secondary | ICD-10-CM | POA: Diagnosis not present

## 2019-11-03 DIAGNOSIS — N3941 Urge incontinence: Secondary | ICD-10-CM | POA: Diagnosis not present

## 2019-11-05 DIAGNOSIS — R350 Frequency of micturition: Secondary | ICD-10-CM | POA: Diagnosis not present

## 2019-11-05 DIAGNOSIS — R3914 Feeling of incomplete bladder emptying: Secondary | ICD-10-CM | POA: Diagnosis not present

## 2019-11-09 ENCOUNTER — Ambulatory Visit (INDEPENDENT_AMBULATORY_CARE_PROVIDER_SITE_OTHER): Payer: Medicare HMO | Admitting: Orthopaedic Surgery

## 2019-11-09 ENCOUNTER — Encounter: Payer: Self-pay | Admitting: Orthopaedic Surgery

## 2019-11-09 VITALS — Ht 60.0 in | Wt 148.0 lb

## 2019-11-09 DIAGNOSIS — G8929 Other chronic pain: Secondary | ICD-10-CM | POA: Diagnosis not present

## 2019-11-09 DIAGNOSIS — M545 Low back pain, unspecified: Secondary | ICD-10-CM

## 2019-11-09 NOTE — Progress Notes (Signed)
Office Visit Note   Patient: Carrie [name]           Date of Birth: 05/01/1950           MRN: [id number] Visit Date: 11/09/2019              Requested by: Sherene Sires, DO No address on file PCP: Richarda Osmond, DO   Assessment & Plan: Visit Diagnoses:  1. Chronic left-sided low back pain without sciatica     Plan: Patient has mild to moderate lumbar stenosis L2-L3 4.  She likely has some thiamine deficiency may have some alcoholic neuropathy as a component.  Would recommend she see her PCP and see if she needs supplementation.  She can follow-up with me in 6 months.  I discussed with her currently with only mild to moderate stenosis I am unsure that surgery would help and some of this may be related to chronic alcoholic neuropathy.  She states she has been evaluated by neuropathy in the past.  Recheck 6 months.  Follow-Up Instructions: Return in about 6 months (around 05/11/2020).   Orders:  No orders of the defined types were placed in this encounter.  No orders of the defined types were placed in this encounter.     Procedures: No procedures performed   Clinical Data: No additional findings.   Subjective: Chief Complaint  Patient presents with  . Lower Back - Pain, Follow-up    HPI patient turns follow-up with problems with standing and walking.  She states she was seen by urologist who recommended she emptied her bladder walk around some then sit back down and tried to void again until she completely empties.  He has a follow-up appointment with her in 3 months.  She states she continues to have problems going to the store she leans on a grocery cart.  She is now drinking whiskey she had been drinking alcohol chronically with long history of alcoholism.  Chart review shows low thiamine level measured 04/23/2018.  Hemoglobin A1c was normal.  Patient not on any multivitamins.  Patient had both MRI as well as lumbar myelogram CT scan.  This showed some mild to moderate  multifactorial spinal stenosis L2-3 and L3-4 which is progressed slightly since 2019 significant contribution from epidural lipomatosis.  No instability.  Review of Systems positive history of breast cancer long-term alcohol abuse.  No previous back surgeries.   Objective: Vital Signs: Ht 5' (1.524 m)   Wt 148 lb (67.1 kg)   BMI 28.90 kg/m   Physical Exam Constitutional:      Appearance: She is well-developed.  HENT:     Head: Normocephalic.     Right Ear: External ear normal.     Left Ear: External ear normal.  Eyes:     Pupils: Pupils are equal, round, and reactive to light.  Neck:     Thyroid: No thyromegaly.     Trachea: No tracheal deviation.  Cardiovascular:     Rate and Rhythm: Normal rate.  Pulmonary:     Effort: Pulmonary effort is normal.  Abdominal:     Palpations: Abdomen is soft.  Skin:    General: Skin is warm and dry.  Neurological:     Mental Status: She is alert and oriented to person, place, and time.  Psychiatric:        Behavior: Behavior normal.     Ortho Exam patient has intact ankle dorsiflexion plantarflexion well-healed total knee arthroplasty incision.  Mild crepitus left  knee range of motion distal pulses are intact.  She is amatory slightly wide based gait.  She can ambulate with and without a cane.  Specialty Comments:  No specialty comments available.  Imaging: No results found.   PMFS History: Patient Active Problem List   Diagnosis Date Noted  . Right leg pain 09/22/2019  . Need for immunization against influenza 02/25/2019  . Seasonal allergies 11/18/2018  . Dysuria 10/02/2018  . Lipoma of face 09/06/2018  . Bug bite 09/06/2018  . Numbness and tingling of foot 10/14/2017  . Abdominal pain, right upper quadrant 05/16/2017  . Neuropathy 04/17/2017  . Cramping of feet 03/14/2017  . Mild tetrahydrocannabinol (THC) abuse 11/20/2016  . Pain in right foot 09/01/2016  . Impingement syndrome of left shoulder 09/01/2016  .  Bilateral sensorineural hearing loss 07/18/2016  . Subjective tinnitus of both ears 07/18/2016  . Spinal stenosis of lumbar region 02/14/2016  . Eustachian tube dysfunction 09/04/2015  . Back pain of thoracolumbar region 05/08/2015  . Epidermoid cyst of skin 01/10/2015  . Muscle cramping 12/05/2014  . Low back pain 06/14/2014  . Preventative health care 03/15/2014  . Diabetes mellitus screening 02/22/2014  . Bilateral knee pain 02/08/2014  . Transaminitis 07/23/2013  . Essential hypertension, benign 07/23/2013  . GERD (gastroesophageal reflux disease) 07/23/2013  . Vitamin D deficiency 07/22/2013  . Breast cancer, left breast ([place]) 07/17/2011  . Steatosis 07/17/2011  . FIBROIDS, UTERUS 01/30/2007  . Alcohol abuse 01/30/2007   Past Medical History:  Diagnosis Date  . Arthritis    "back, right hand" (07/11/2014)  . Breast cancer [facility]) 2011   right breast  . Breast cancer, right breast ([place]) 05/2009  . Chronic lower back pain   . GERD (gastroesophageal reflux disease)   . Hepatic steatosis 07/17/2011  . Hypertension   . Hypokalemia 07/17/2011  . Lower GI bleeding 07/11/2014 hospitalized  . Personal history of radiation therapy 2011    Family History  Problem Relation Age of Onset  . Cancer Other   . Diabetes Mother   . Hypertension Mother   . Diabetes Brother   . Hypertension Father   . Arthritis/Rheumatoid Father   . Cancer Father   . Cancer Maternal Grandfather   . Heart disease Brother     Past Surgical History:  Procedure Laterality Date  . BREAST BIOPSY Right 2011  . BREAST LUMPECTOMY Right 2011  . COLONOSCOPY    . ESOPHAGOGASTRODUODENOSCOPY N/A 07/12/2014   Procedure: ESOPHAGOGASTRODUODENOSCOPY (EGD);  Surgeon: Clarene Essex, MD;  [facility] ENDOSCOPY;  Service: Endoscopy;  Laterality: N/A;  . FINGER SURGERY     middle finger on left hand  . MASTECTOMY COMPLETE / SIMPLE W/ SENTINEL NODE BIOPSY  05/2009   Archie Endo 05/23/2009  . TOTAL KNEE ARTHROPLASTY Right 10/23/2016   . TOTAL KNEE ARTHROPLASTY Right 10/23/2016   Procedure: RIGHT TOTAL KNEE ARTHROPLASTY;  Surgeon: [name], MD;  Location: [place];  Service: Orthopedics;  Laterality: Right;   Social History   Occupational History  . Occupation: retired    Comment: housekeeping  Tobacco Use  . Smoking status: Never Smoker  . Smokeless tobacco: Never Used  Vaping Use  . Vaping Use: Never used  Substance and Sexual Activity  . Alcohol use: Yes    Alcohol/week: 14.0 standard drinks    Types: 10 Glasses of wine, 4 Shots of liquor per week    Comment: daily since her 20s several a day   . Drug use: Yes    Frequency:  7.0 times per week    Types: Marijuana  . Sexual activity: Yes    Birth control/protection: Post-menopausal, Condom    Comment: 2 partners in last year without condom use

## 2019-11-16 ENCOUNTER — Other Ambulatory Visit: Payer: Self-pay | Admitting: *Deleted

## 2019-11-17 MED ORDER — PANTOPRAZOLE SODIUM 20 MG PO TBEC: 20.0000 mg | DELAYED_RELEASE_TABLET | Freq: Every day | ORAL | 0 refills | Status: DC

## 2019-11-25 ENCOUNTER — Encounter: Payer: Self-pay | Admitting: Student in an Organized Health Care Education/Training Program

## 2019-11-25 ENCOUNTER — Other Ambulatory Visit: Payer: Self-pay

## 2019-11-25 ENCOUNTER — Ambulatory Visit (INDEPENDENT_AMBULATORY_CARE_PROVIDER_SITE_OTHER): Payer: Medicare HMO | Admitting: Student in an Organized Health Care Education/Training Program

## 2019-11-25 VITALS — BP 116/80 | HR 84 | Ht 60.0 in | Wt 153.0 lb

## 2019-11-25 DIAGNOSIS — F1020 Alcohol dependence, uncomplicated: Secondary | ICD-10-CM | POA: Diagnosis not present

## 2019-11-25 DIAGNOSIS — R2689 Other abnormalities of gait and mobility: Secondary | ICD-10-CM

## 2019-11-25 DIAGNOSIS — G629 Polyneuropathy, unspecified: Secondary | ICD-10-CM

## 2019-11-25 DIAGNOSIS — R6889 Other general symptoms and signs: Secondary | ICD-10-CM | POA: Diagnosis not present

## 2019-11-25 NOTE — Patient Instructions (Signed)
It was a pleasure to see you today!  To summarize our discussion for this visit:  I AM your primary doctor now!  Today, we are going to test your blood for level of different vitamins to see if anything could be contributing to your pain and balance issues that we can correct  I will let you know the results when they are all back.  Let me know if there is anything I can do to help you with cutting back on drinking  Some additional health maintenance measures we should update are: Health Maintenance Due  Topic Date Due  . PNA vac Low Risk Adult (1 of 2 - PCV13) 09/25/2017  . INFLUENZA VACCINE  10/31/2019  .    Please return to our clinic to see me in about 1 month.  Call the clinic at [phone] if your symptoms worsen or you have any concerns.   Thank you for allowing me to take part in your care,  Dr. Doristine Mango   Healthy Eating to Prevent Digestive Disorders The digestive system starts at the mouth and goes all the way down to the rectum. Along the way, your digestive system breaks down the food you eat so you can absorb its nutrients and use them for energy. Digestive disorders can cause gas, bloating, pain, heartburn, and other symptoms. They can prevent your digestive system from doing its job. Healthy eating and a healthy lifestyle can help you avoid many common digestive disorders. What nutrition changes can be made? Start by eating a balanced diet. Eat healthy foods from all the major food groups. These include carbohydrates, fats, and proteins. Other changes you can make include to:  Eat enough fiber. Fiber is a healthy carbohydrate that cleans out your digestive system. Fiber absorbs water and helps you have regular bowel movements. Fiber comes from plants. To get enough fiber in your diet, eat 4-5 servings of fruits, vegetables, and legumes every day. Include beans and whole grains. Most people should get 20?35 grams of fiber each day.  Drink enough water to  keep your urine clear or pale yellow. Water helps your body digest food. It can also help prevent constipation.  Avoid fatty proteins. Full-fat dairy products and fatty meats are hard to digest. Fats you want to avoid are those that get solid at room temperature (saturated fats). Instead of eating these kinds of fats, eat plant-based unsaturated fats found in olives, canola, corn, avocado, and nuts.  If you have trouble with gas, belching, or flatulence, avoid gas-producing foods. These include beans, carbonated beverages, cabbage, cauliflower, and broccoli. If you are lactose intolerant, avoid dairy products or choose lactose-free dairy products.  If you have frequent heartburn, stay away from alcohol, caffeine, fatty foods, chocolate, and peppermint. Avoid lying down within two hours of eating a full meal. Overeating and lying down too soon after a meal can cause heartburn.  Add probiotics to your diet. Healthy digestion depends on having the right balance of good bacteria in your colon. Probiotics can help restore the balance of good bacteria in your digestive system. Probiotics are live active cultures that are found in yogurt, kefir, and cultured foods like sauerkraut and miso. You can also add good bacteria with probiotic supplements.  Make sure to chew your food slowly and completely.  Instead of eating three large meals each day, eat three small meals with three small snacks.  What other changes can I make? You can help your digestive system stay healthy by making these  lifestyle changes:  Stay active and exercise every day.  Maintain a healthy weight.  Eat on a regular schedule.  Avoid tight-fitting clothes. They can restrict digestion.  If you have frequent heartburn, raise the head of your bed 2-3 inches (5-7.5 cm).  Do not use any tobacco products, such as cigarettes, chewing tobacco, and e-cigarettes. If you need help quitting, ask your health care provider.  Limit alcohol  intake to no more than 1 drink a day for nonpregnant women and 2 drinks a day for men. One drink equals 12 oz of beer, 5 oz of wine, or 1 oz of hard liquor.  Avoid stress. Find ways to reduce stress, such as meditation, exercise, or taking time for activities that relax you. Why should I make these changes? Making these changes will help your digestive system function at its best. A healthy digestive system can help you avoid or improve your management of digestive disorders such as:  Bloating, gas, and flatulence.  Heartburn.  Gastroesophageal reflux disease (GERD).  Peptic ulcer disease.  Hemorrhoids.  Diverticulitis.  Constipation.  Diarrhea.  Gall stones  Irritable bowel syndrome.  Malnutrition.  Fatty liver disease. What can happen if changes are not made? Not making these changes could put you at risk for many conditions caused by a poor diet or an unhealthy weight, such as heart disease, stroke and diabetes. Where can I get more information? Learn more about healthy eating and digestive disorders by visiting these websites:  Academy of Nutrition and Dietetics: DenimDistribution.com.ee  Centers for Disease Control and Prevention: CoinSpecialists.co.za  U.S. Department of Health and Human Services: [place]CarWash.com.cy.pdf Summary  A heathy diet can help prevent many digestive disorders.  Eat a balanced diet consisting of fiber, unsaturated fats, lean protein, fruits, and vegetables.  Eat three small meals with three small snacks per day.  Drink plenty of water every day.  Get plenty of exercise and maintain a healthy weight. This information is not intended to replace advice given to you by your health care provider. Make sure you discuss any questions you have with your health care provider. Document Revised: 03/28/2016 Document  Reviewed: 11/29/2015 Elsevier Patient Education  2020 [employer].

## 2019-11-25 NOTE — Progress Notes (Signed)
   SUBJECTIVE:   CHIEF COMPLAINT / HPI: f/u from ortho. Back pain  Back and leg and pain for years and is getting worse. Radiates down bilateral legs. Worse on left. Numbness and tingling in toes which is chronic. Rainy weather makes it worse. Does interfere with sleeping. Can complete normal daily activities. Prevents from walking long distances.  No leg weakness or giving out. Has subjective balance issues. No falls or injuries. Not using a walker or cane.  Hasn't tried anything for it yet.  Dr. Lorin Mercy (ortho) is following- describes spinal stenosis. He requested she see PCP for bloodwork to assess vitamin levels. Endorses drinking large amount of alcohol chronically with no intentions to cut back or stop. Drinks 1 pint per day hard liquor as well as some beer. Has cut back on beer due to taste. Gets shakes if doesn't drink. No seizures in the past that she can remember. Maybe hospitalized a few years ago for withdrawals and GI bleed.  Denies black or bloody stools now.  Does not take vitamins or supplements.   OBJECTIVE:   BP 116/80   Pulse 84   Ht 5' (1.524 m)   Wt 153 lb (69.4 kg)   SpO2 97%   BMI 29.88 kg/m   Right Ankle Exam   Comments:  Decreased sensation with monofilament on plantar surface   Left Ankle Exam   Comments:  Decreased sensation with monofilament on plantar surface   Right Knee Exam   Muscle Strength  The patient has normal right knee strength.  Tenderness  The patient is experiencing no tenderness.   Range of Motion  The patient has normal right knee ROM.  Tests  Varus: negative Valgus: negative   Left Knee Exam   Muscle Strength  The patient has normal left knee strength.  Tenderness  The patient is experiencing no tenderness.   Range of Motion  The patient has normal left knee ROM.  Tests  Varus: negative Valgus: negative   Right Hip Exam   Tenderness  The patient is experiencing tenderness in the posterior.  Range of Motion    The patient has normal right hip ROM.  Muscle Strength  The patient has normal right hip strength.  Tests  FABER: negative Ober: positive   Left Hip Exam   Tenderness  The patient is experiencing no tenderness.   Range of Motion  The patient has normal left hip ROM.  Muscle Strength  The patient has normal left hip strength.   Tests  FABER: negative Ober: negative   Back Exam   Range of Motion  Extension: normal  Flexion: normal   Muscle Strength  The patient has normal back strength.  Tests  Straight leg raise right: negative Straight leg raise left: negative  Other  Gait: shuffling.   Comments:  Negative pronator drift. She was not steady on her feet     ASSESSMENT/PLAN:   Balance problem From history and exam, likely multifactorial -Follow-up with Ortho surgery for spinal contributions -Recommend PT or home exercises to strengthen stabilizer muscle groups -Counseled patient on excessive alcohol use and some of the consequences of this.  Offered assistance in cutting back but she was uninterested at this time. -Obtaining labs of vitamins that can also contribute to gait issues given that patient is chronic alcoholic     Richarda Osmond, [place]

## 2019-11-30 DIAGNOSIS — R2689 Other abnormalities of gait and mobility: Secondary | ICD-10-CM | POA: Insufficient documentation

## 2019-11-30 NOTE — Assessment & Plan Note (Signed)
From history and exam, likely multifactorial -Follow-up with Ortho surgery for spinal contributions -Recommend PT or home exercises to strengthen stabilizer muscle groups -Counseled patient on excessive alcohol use and some of the consequences of this.  Offered assistance in cutting back but she was uninterested at this time. -Obtaining labs of vitamins that can also contribute to gait issues given that patient is chronic alcoholic

## 2019-12-04 LAB — CBC
Hematocrit: 31.9 % — ABNORMAL LOW (ref 34.0–46.6)
Hemoglobin: 11.2 g/dL (ref 11.1–15.9)
MCH: 31.5 pg (ref 26.6–33.0)
MCHC: 35.1 g/dL (ref 31.5–35.7)
MCV: 90 fL (ref 79–97)
Platelets: 309 10*3/uL (ref 150–450)
RBC: 3.55 x10E6/uL — ABNORMAL LOW (ref 3.77–5.28)
RDW: 15.3 % (ref 11.7–15.4)
WBC: 5.8 10*3/uL (ref 3.4–10.8)

## 2019-12-04 LAB — COMPREHENSIVE METABOLIC PANEL
ALT: 29 IU/L (ref 0–32)
AST: 56 IU/L — ABNORMAL HIGH (ref 0–40)
Albumin/Globulin Ratio: 1.3 (ref 1.2–2.2)
Albumin: 4.1 g/dL (ref 3.8–4.8)
Alkaline Phosphatase: 63 IU/L (ref 48–121)
BUN/Creatinine Ratio: 12 (ref 12–28)
BUN: 11 mg/dL (ref 8–27)
Bilirubin Total: 0.2 mg/dL (ref 0.0–1.2)
CO2: 24 mmol/L (ref 20–29)
Calcium: 9.6 mg/dL (ref 8.7–10.3)
Chloride: 98 mmol/L (ref 96–106)
Creatinine, Ser: 0.94 mg/dL (ref 0.57–1.00)
GFR calc Af Amer: 73 mL/min/{1.73_m2} (ref >59)
GFR calc non Af Amer: 63 mL/min/{1.73_m2} (ref >59)
Globulin, Total: 3.1 g/dL (ref 1.5–4.5)
Glucose: 100 mg/dL — ABNORMAL HIGH (ref 65–99)
Potassium: 4.3 mmol/L (ref 3.5–5.2)
Sodium: 139 mmol/L (ref 134–144)
Total Protein: 7.2 g/dL (ref 6.0–8.5)

## 2019-12-04 LAB — VITAMIN B12: Vitamin B-12: 286 pg/mL (ref 232–1245)

## 2019-12-04 LAB — VITAMIN D 25 HYDROXY (VIT D DEFICIENCY, FRACTURES): Vit D, 25-Hydroxy: 11 ng/mL — ABNORMAL LOW (ref 30.0–100.0)

## 2019-12-04 LAB — TSH: TSH: 1.52 u[IU]/mL (ref 0.450–4.500)

## 2019-12-04 LAB — HEMOGLOBIN A1C
Est. average glucose Bld gHb Est-mCnc: 103 mg/dL
Hgb A1c MFr Bld: 5.2 % (ref 4.8–5.6)

## 2019-12-04 LAB — FOLATE: Folate: 6.5 ng/mL (ref >3.0)

## 2019-12-04 LAB — VITAMIN B1: Thiamine: 67 nmol/L (ref 66.5–200.0)

## 2019-12-07 ENCOUNTER — Other Ambulatory Visit: Payer: Self-pay | Admitting: Student in an Organized Health Care Education/Training Program

## 2019-12-07 MED ORDER — VITAMIN D (ERGOCALCIFEROL) 1.25 MG (50000 UNIT) PO CAPS
50000.0000 [IU] | ORAL_CAPSULE | ORAL | 0 refills | Status: DC
Start: 2019-12-07 — End: 2020-05-12

## 2019-12-07 NOTE — Progress Notes (Signed)
Labs mostly normal. Vitamin D low so will send in 5 tablets of weekly treatment. Should be rechecked at the end of treatment. Mild abnormalities in liver enzymes but are improved from previous. Discussed cutting back on alcohol consumption at last visit

## 2020-01-03 ENCOUNTER — Other Ambulatory Visit: Payer: Self-pay | Admitting: Student in an Organized Health Care Education/Training Program

## 2020-01-06 ENCOUNTER — Other Ambulatory Visit: Payer: Self-pay

## 2020-01-06 DIAGNOSIS — E876 Hypokalemia: Secondary | ICD-10-CM

## 2020-01-06 DIAGNOSIS — G629 Polyneuropathy, unspecified: Secondary | ICD-10-CM

## 2020-01-07 MED ORDER — GABAPENTIN 100 MG PO CAPS: 200.0000 mg | ORAL_CAPSULE | Freq: Three times a day (TID) | ORAL | 0 refills | Status: AC

## 2020-01-14 ENCOUNTER — Ambulatory Visit: Payer: Self-pay

## 2020-01-14 ENCOUNTER — Encounter: Payer: Self-pay | Admitting: Orthopaedic Surgery

## 2020-01-14 ENCOUNTER — Ambulatory Visit (INDEPENDENT_AMBULATORY_CARE_PROVIDER_SITE_OTHER): Payer: Medicare HMO | Admitting: Orthopaedic Surgery

## 2020-01-14 VITALS — BP 128/84 | HR 83 | Ht 60.0 in | Wt 157.0 lb

## 2020-01-14 DIAGNOSIS — M25562 Pain in left knee: Secondary | ICD-10-CM

## 2020-01-14 DIAGNOSIS — R6889 Other general symptoms and signs: Secondary | ICD-10-CM | POA: Diagnosis not present

## 2020-01-14 DIAGNOSIS — G8929 Other chronic pain: Secondary | ICD-10-CM

## 2020-01-14 NOTE — Progress Notes (Signed)
Office Visit Note   Patient: Carrie [name]           Date of Birth: [birth year]           MRN: [id number] Visit Date: 01/14/2020              Requested by: Anderson, Chelsey L, DO [address],  [place] [id number] PCP: Richarda Osmond, DO   Assessment & Plan: Visit Diagnoses:  1. Chronic pain of left knee     Plan: Patient has left knee osteoarthritis not severe enough to consider total knee arthroplasty at this point.  She will return and we will recheck in 4 months.  Follow-Up Instructions: No follow-ups on file.   Orders:  Orders Placed This Encounter  Procedures  . XR KNEE 3 VIEW LEFT   No orders of the defined types were placed in this encounter.     Procedures: No procedures performed   Clinical Data: No additional findings.   Subjective: Chief Complaint  Patient presents with  . Left Knee - Pain    HPI 69 year old female returns.  She had knee injection 12/23/2018 with some improvement.  She has had increased pain for the last week.  She states her knee hurts all the time.  Patient's use heating pad with slight improvement.  Right total knee arthroplasty 2018 doing well.  She has progressive left knee osteoarthritis.  Review of Systems 14 point review of systems updated unchanged from last visit.   Objective: Vital Signs: BP 128/84   Pulse 83   Ht 5' (1.524 m)   Wt 157 lb (71.2 kg)   BMI 30.66 kg/m   Physical Exam Constitutional:      Appearance: She is well-developed.  HENT:     Head: Normocephalic.     Right Ear: External ear normal.     Left Ear: External ear normal.  Eyes:     Pupils: Pupils are equal, round, and reactive to light.  Neck:     Thyroid: No thyromegaly.     Trachea: No tracheal deviation.  Cardiovascular:     Rate and Rhythm: Normal rate.  Pulmonary:     Effort: Pulmonary effort is normal.  Abdominal:     Palpations: Abdomen is soft.  Skin:    General: Skin is warm and dry.  Neurological:     Mental  Status: She is alert and oriented to person, place, and time.  Psychiatric:        Behavior: Behavior normal.     Ortho Exam Patient has left knee crepitus.  Negative logroll to the hips.  Minimal sciatic notch tenderness. Specialty Comments:  No specialty comments available.  Imaging: No results found.   PMFS History: Patient Active Problem List   Diagnosis Date Noted  . Balance problem 11/30/2019  . Right leg pain 09/22/2019  . Need for immunization against influenza 02/25/2019  . Seasonal allergies 11/18/2018  . Dysuria 10/02/2018  . Lipoma of face 09/06/2018  . Bug bite 09/06/2018  . Numbness and tingling of foot 10/14/2017  . Abdominal pain, right upper quadrant 05/16/2017  . Neuropathy 04/17/2017  . Cramping of feet 03/14/2017  . Mild tetrahydrocannabinol (THC) abuse 11/20/2016  . Pain in right foot 09/01/2016  . Impingement syndrome of left shoulder 09/01/2016  . Bilateral sensorineural hearing loss 07/18/2016  . Subjective tinnitus of both ears 07/18/2016  . Spinal stenosis of lumbar region 02/14/2016  . Eustachian tube dysfunction 09/04/2015  . Back pain of thoracolumbar  region 05/08/2015  . Epidermoid cyst of skin 01/10/2015  . Muscle cramping 12/05/2014  . Low back pain 06/14/2014  . Preventative health care 03/15/2014  . Diabetes mellitus screening 02/22/2014  . Bilateral knee pain 02/08/2014  . Transaminitis 07/23/2013  . Essential hypertension, benign 07/23/2013  . GERD (gastroesophageal reflux disease) 07/23/2013  . Vitamin D deficiency 07/22/2013  . Breast cancer, left breast ([place]) 07/17/2011  . Steatosis 07/17/2011  . FIBROIDS, UTERUS 01/30/2007  . Alcohol abuse 01/30/2007   Past Medical History:  Diagnosis Date  . Arthritis    "back, right hand" (07/11/2014)  . Breast cancer [facility]) 2011   right breast  . Breast cancer, right breast ([place]) 05/2009  . Chronic lower back pain   . GERD (gastroesophageal reflux disease)   . Hepatic steatosis  07/17/2011  . Hypertension   . Hypokalemia 07/17/2011  . Lower GI bleeding 07/11/2014 hospitalized  . Personal history of radiation therapy 2011    Family History  Problem Relation Age of Onset  . Cancer Other   . Diabetes Mother   . Hypertension Mother   . Diabetes Brother   . Hypertension Father   . Arthritis/Rheumatoid Father   . Cancer Father   . Cancer Maternal Grandfather   . Heart disease Brother     Past Surgical History:  Procedure Laterality Date  . BREAST BIOPSY Right 2011  . BREAST LUMPECTOMY Right 2011  . COLONOSCOPY    . ESOPHAGOGASTRODUODENOSCOPY N/A 07/12/2014   Procedure: ESOPHAGOGASTRODUODENOSCOPY (EGD);  Surgeon: Clarene Essex, MD;  Location: [facility] ENDOSCOPY;  Service: Endoscopy;  Laterality: N/A;  . FINGER SURGERY     middle finger on left hand  . MASTECTOMY COMPLETE / SIMPLE W/ SENTINEL NODE BIOPSY  05/2009   Archie Endo 05/23/2009  . TOTAL KNEE ARTHROPLASTY Right 10/23/2016  . TOTAL KNEE ARTHROPLASTY Right 10/23/2016   Procedure: RIGHT TOTAL KNEE ARTHROPLASTY;  Surgeon: [name], MD;  Location: [place];  Service: Orthopedics;  Laterality: Right;   Social History   Occupational History  . Occupation: retired    Comment: housekeeping  Tobacco Use  . Smoking status: Never Smoker  . Smokeless tobacco: Never Used  Vaping Use  . Vaping Use: Never used  Substance and Sexual Activity  . Alcohol use: Yes    Alcohol/week: 14.0 standard drinks    Types: 10 Glasses of wine, 4 Shots of liquor per week    Comment: daily since her 20s several a day   . Drug use: Yes    Frequency: 7.0 times per week    Types: Marijuana  . Sexual activity: Yes    Birth control/protection: Post-menopausal, Condom    Comment: 2 partners in last year without condom use

## 2020-02-04 DIAGNOSIS — R8279 Other abnormal findings on microbiological examination of urine: Secondary | ICD-10-CM | POA: Diagnosis not present

## 2020-02-04 DIAGNOSIS — N318 Other neuromuscular dysfunction of bladder: Secondary | ICD-10-CM | POA: Diagnosis not present

## 2020-02-04 DIAGNOSIS — R3914 Feeling of incomplete bladder emptying: Secondary | ICD-10-CM | POA: Diagnosis not present

## 2020-02-04 DIAGNOSIS — R6889 Other general symptoms and signs: Secondary | ICD-10-CM | POA: Diagnosis not present

## 2020-02-17 DIAGNOSIS — N318 Other neuromuscular dysfunction of bladder: Secondary | ICD-10-CM | POA: Diagnosis not present

## 2020-02-17 DIAGNOSIS — R6889 Other general symptoms and signs: Secondary | ICD-10-CM | POA: Diagnosis not present

## 2020-03-23 DIAGNOSIS — Z124 Encounter for screening for malignant neoplasm of cervix: Secondary | ICD-10-CM | POA: Diagnosis not present

## 2020-03-23 DIAGNOSIS — R6889 Other general symptoms and signs: Secondary | ICD-10-CM | POA: Diagnosis not present

## 2020-03-23 DIAGNOSIS — Z01419 Encounter for gynecological examination (general) (routine) without abnormal findings: Secondary | ICD-10-CM | POA: Diagnosis not present

## 2020-03-23 DIAGNOSIS — R1909 Other intra-abdominal and pelvic swelling, mass and lump: Secondary | ICD-10-CM | POA: Diagnosis not present

## 2020-03-23 DIAGNOSIS — N83202 Unspecified ovarian cyst, left side: Secondary | ICD-10-CM | POA: Diagnosis not present

## 2020-03-29 ENCOUNTER — Other Ambulatory Visit: Payer: Self-pay | Admitting: *Deleted

## 2020-03-29 MED ORDER — PANTOPRAZOLE SODIUM 20 MG PO TBEC: 20.0000 mg | DELAYED_RELEASE_TABLET | Freq: Every day | ORAL | 0 refills | Status: DC

## 2020-04-14 ENCOUNTER — Other Ambulatory Visit: Payer: Self-pay

## 2020-04-14 DIAGNOSIS — I10 Essential (primary) hypertension: Secondary | ICD-10-CM

## 2020-04-15 MED ORDER — AMLODIPINE BESYLATE 10 MG PO TABS: 10.0000 mg | ORAL_TABLET | Freq: Every day | ORAL | 0 refills | Status: DC

## 2020-04-15 NOTE — Telephone Encounter (Signed)
Refilled amlodipine x1 month. Will need appointment for further refills.

## 2020-05-01 NOTE — Patient Instructions (Addendum)
DUE TO COVID-19 ONLY ONE VISITOR IS ALLOWED TO COME WITH YOU AND STAY IN THE WAITING ROOM ONLY DURING PRE OP AND PROCEDURE DAY OF SURGERY. THE 1 VISITOR  MAY VISIT WITH YOU AFTER SURGERY IN YOUR PRIVATE ROOM DURING VISITING HOURS ONLY!  YOU NEED TO HAVE A COVID 19 TEST ON__2/7_____ @__12 :40_____, THIS TEST MUST BE DONE BEFORE SURGERY,  COVID TESTING SITE [place] [id number], IT IS ON THE RIGHT GOING OUT [address] APPROXIMATELY  2 MINUTES PAST [employer] ON THE RIGHT. ONCE YOUR COVID TEST IS COMPLETED,  PLEASE BEGIN THE QUARANTINE INSTRUCTIONS AS OUTLINED IN YOUR HANDOUT.                ANYLA ISRAELSON    Your procedure is scheduled on: 05/11/20   Report to [facility] Main  Entrance   Report to Short stay at 5:30  AM     Call this number if you have problems the morning of surgery [phone]    Remember: Do not eat food or drink liquids :After Midnight  . BRUSH YOUR TEETH MORNING OF SURGERY AND RINSE YOUR MOUTH OUT, NO CHEWING GUM CANDY OR MINTS.     Take these medicines the morning of surgery with A SIP OF WATER: Gabapentin, Amlodipine, Pantoprazole                                 You may not have any metal on your body including hair pins and              piercings  Do not wear jewelry, make-up, lotions, powders or perfumes, deodorant             Do not wear nail polish on your fingernails.  Do not shave  48 hours prior to surgery.         .   Do not bring valuables to the hospital. [place].  Contacts, dentures or bridgework may not be worn into surgery.                   Please read over the following fact sheets you were given: _____________________________________________________________________             [facility] - Preparing for Surgery Before surgery, you can play an important role.   Because skin is not sterile, your skin needs to be as free of germs as possible.    You can reduce the number of germs on your skin by washing with CHG (chlorahexidine gluconate) soap before surgery.   CHG is an antiseptic cleaner which kills germs and bonds with the skin to continue killing germs even after washing. Please DO NOT use if you have an allergy to CHG or antibacterial soaps.   If your skin becomes reddened/irritated stop using the CHG and inform your nurse when you arrive at Short Stay. Do not shave (including legs and underarms) for at least 48 hours prior to the first CHG shower.    Please follow these instructions carefully:  1.  Shower with CHG Soap the night before surgery and the  morning of Surgery.  2.  If you choose to wash your hair, wash your hair first as usual with your  normal  shampoo.  3.  After you shampoo, rinse your  hair and body thoroughly to remove the  shampoo.                                        4.  Use CHG as you would any other liquid soap.  You can apply chg directly  to the skin and wash                       Gently with a scrungie or clean washcloth.  5.  Apply the CHG Soap to your body ONLY FROM THE NECK DOWN.   Do not use on face/ open                           Wound or open sores. Avoid contact with eyes, ears mouth and genitals (private parts).                       Wash face,  Genitals (private parts) with your normal soap.             6.  Wash thoroughly, paying special attention to the area where your surgery  will be performed.  7.  Thoroughly rinse your body with warm water from the neck down.  8.  DO NOT shower/wash with your normal soap after using and rinsing off  the CHG Soap.             9.  Pat yourself dry with a clean towel.            10.  Wear clean pajamas.            11.  Place clean sheets on your bed the night of your first shower and do not  sleep with pets. Day of Surgery : Do not apply any lotions/deodorants the morning of surgery.  Please wear clean clothes to the hospital/surgery center.  FAILURE TO  FOLLOW THESE INSTRUCTIONS MAY RESULT IN THE CANCELLATION OF YOUR SURGERY PATIENT SIGNATURE_________________________________  NURSE SIGNATURE__________________________________  ________________________________________________________________________

## 2020-05-01 NOTE — H&P (Signed)
Carrie, [name] MEDICAL RECORD IR:[id number] ACCOUNT [id number] DATE OF BIRTH:06/24/1950 FACILITY: [facility] LOCATION:  PHYSICIAN:Janeva Peaster Garry Heater, MD  HISTORY AND PHYSICAL  DATE OF ADMISSION:  05/11/2020  Scheduled at [facility], 05/11/2020  CHIEF COMPLAINT:  Ovarian cyst.  HISTORY OF PRESENT ILLNESS:  A 70 year old G0 P0, I saw her on referral after she had initially been seen by orthopedic physician for some back problems, MRI showed the possibility of urinary retention due to a large pelvic cystic mass, ultimately seen  by urology.  Their CT suggested possible ovarian cyst.  She has been relatively asymptomatic except for some increased pelvic pressure.  No prior history of abdominal surgery or other specific GYN complaints.  Ultrasound in our office showed large cystic mass, most likely coming from the left ovary or left paratubal area, 13.5 x 7.9 x 8.9 cm.  Her CA-125 returned normal at 6.6.  Pap smear normal except for showing the possibility of BV.  She presents at this  time for [facility].  This procedure including the specific risks regarding bleeding, infection, adjacent organ injury, wound infection, phlebitis and transfusion along with her expected postoperative recovery were all reviewed and discussed with her.  We  did discuss the possibility that this could be ovarian or paratubal or could be an LMP type tumor.  ALLERGIES:  None.  CURRENT MEDICATIONS:  Amlodipine 10 mg daily, gabapentin as needed, Naprosyn p.r.n., pantoprazole once daily 20 mg and KCl 20 mEq daily.  REVIEW OF SYSTEMS:  Includes history of anxiety, arthritis, hearing loss, GERD, hypertension, spinal stenosis at L3-L4.  SOCIAL HISTORY:  She is a nonsmoker.  She has a history of alcohol use in the past.  No prior surgery.  PAST MEDICAL HISTORY:  Significant for history of breast cancer treated years ago.  Her last mammogram, 07/21.  Colonoscopy, 2014.  PHYSICAL EXAMINATION: VITAL SIGNS:  Temperature  98.2, blood pressure 130/82. HEENT:  Unremarkable. NECK:  Supple, without masses.  Thyroid nonpalpable. BREASTS:  Without masses, tenderness, or nipple discharge. ABDOMEN:  Soft, flat, nontender. PELVIC:  Vulva, vagina, cervix normal.  Impression of a cystic mass on pelvic exam but hard to define. EXTREMITIES:  Unremarkable. NEUROLOGIC:  Unremarkable.  IMPRESSION:  Large paratubal/ovarian cystic mass.  PLAN:  For TAHBSO.  Procedure and risks discussed as above.  HN/NUANCE  D:04/28/2020 T:04/28/2020 JOB:[id number]

## 2020-05-01 NOTE — Progress Notes (Signed)
Pt informed not to show for her covid  Test on 2/1 as she was scheduled too early. Pt's test rescheduled for 2/7 at [address]

## 2020-05-02 ENCOUNTER — Other Ambulatory Visit (HOSPITAL_COMMUNITY): Payer: Medicare HMO

## 2020-05-02 ENCOUNTER — Encounter (HOSPITAL_COMMUNITY)
Admission: RE | Admit: 2020-05-02 | Discharge: 2020-05-02 | Disposition: A | Discharge status: Home / Self Care | Payer: Medicare HMO | Source: Ambulatory Visit | Attending: Obstetrics and Gynecology | Admitting: Obstetrics and Gynecology

## 2020-05-02 ENCOUNTER — Encounter (HOSPITAL_COMMUNITY): Payer: Self-pay

## 2020-05-02 ENCOUNTER — Other Ambulatory Visit: Payer: Self-pay

## 2020-05-02 DIAGNOSIS — Z01818 Encounter for other preprocedural examination: Secondary | ICD-10-CM | POA: Diagnosis not present

## 2020-05-02 DIAGNOSIS — I10 Essential (primary) hypertension: Secondary | ICD-10-CM | POA: Diagnosis not present

## 2020-05-02 HISTORY — DX: Myoneural disorder, unspecified: G70.9

## 2020-05-02 LAB — CBC
HCT: 38.4 % (ref 36.0–46.0)
Hemoglobin: 13.1 g/dL (ref 12.0–15.0)
MCH: 32.1 pg (ref 26.0–34.0)
MCHC: 34.1 g/dL (ref 30.0–36.0)
MCV: 94.1 fL (ref 80.0–100.0)
Platelets: 292 10*3/uL (ref 150–400)
RBC: 4.08 MIL/uL (ref 3.87–5.11)
RDW: 13.4 % (ref 11.5–15.5)
WBC: 4.6 10*3/uL (ref 4.0–10.5)
nRBC: 0 % (ref 0.0–0.2)

## 2020-05-02 NOTE — Progress Notes (Signed)
COVID Vaccine Completed:yes Date COVID Vaccine completed:07/05/19-Booster 02/16/20 COVID vaccine manufacturer: [employer]      PCP - Doristine Mango DO Cardiologist - none  Chest x-ray - no EKG - 05/02/20 chart, epuic Stress Test - no ECHO - no Cardiac Cath - no Pacemaker/ICD device last checked:NA  Sleep Study - no CPAP -   Fasting Blood Sugar - NA Checks Blood Sugar _____ times a day  Blood Thinner Instructions:NA Aspirin Instructions: Last Dose:  Anesthesia review:   Patient denies shortness of breath, fever, cough and chest pain at PAT appointment  Yes Patient verbalized understanding of instructions that were given to them at the PAT appointment. Patient was also instructed that they will need to review over the PAT instructions again at home before surgery.Yes Pt doesn't climb stairs but reports no SOB doing house work or with ADLs

## 2020-05-08 ENCOUNTER — Other Ambulatory Visit (HOSPITAL_COMMUNITY)
Admission: RE | Admit: 2020-05-08 | Discharge: 2020-05-08 | Disposition: A | Discharge status: Home / Self Care | Payer: Medicare HMO | Source: Ambulatory Visit | Attending: Obstetrics and Gynecology | Admitting: Obstetrics and Gynecology

## 2020-05-08 DIAGNOSIS — N888 Other specified noninflammatory disorders of cervix uteri: Secondary | ICD-10-CM | POA: Diagnosis not present

## 2020-05-08 DIAGNOSIS — N83209 Unspecified ovarian cyst, unspecified side: Secondary | ICD-10-CM | POA: Diagnosis not present

## 2020-05-08 DIAGNOSIS — K219 Gastro-esophageal reflux disease without esophagitis: Secondary | ICD-10-CM | POA: Diagnosis not present

## 2020-05-08 DIAGNOSIS — E559 Vitamin D deficiency, unspecified: Secondary | ICD-10-CM | POA: Diagnosis not present

## 2020-05-08 DIAGNOSIS — N736 Female pelvic peritoneal adhesions (postinfective): Secondary | ICD-10-CM | POA: Diagnosis not present

## 2020-05-08 DIAGNOSIS — Z01812 Encounter for preprocedural laboratory examination: Secondary | ICD-10-CM | POA: Insufficient documentation

## 2020-05-08 DIAGNOSIS — N7011 Chronic salpingitis: Secondary | ICD-10-CM | POA: Diagnosis not present

## 2020-05-08 DIAGNOSIS — D259 Leiomyoma of uterus, unspecified: Secondary | ICD-10-CM | POA: Diagnosis not present

## 2020-05-08 DIAGNOSIS — I10 Essential (primary) hypertension: Secondary | ICD-10-CM | POA: Diagnosis not present

## 2020-05-08 DIAGNOSIS — N83202 Unspecified ovarian cyst, left side: Secondary | ICD-10-CM | POA: Diagnosis not present

## 2020-05-08 DIAGNOSIS — Z853 Personal history of malignant neoplasm of breast: Secondary | ICD-10-CM | POA: Diagnosis not present

## 2020-05-08 DIAGNOSIS — Z20822 Contact with and (suspected) exposure to covid-19: Secondary | ICD-10-CM | POA: Diagnosis not present

## 2020-05-08 DIAGNOSIS — Z01818 Encounter for other preprocedural examination: Secondary | ICD-10-CM | POA: Diagnosis not present

## 2020-05-08 LAB — SARS CORONAVIRUS 2 (TAT 6-24 HRS): SARS Coronavirus 2: NEGATIVE

## 2020-05-09 ENCOUNTER — Ambulatory Visit: Payer: Medicare HMO | Admitting: Orthopaedic Surgery

## 2020-05-10 DIAGNOSIS — N83209 Unspecified ovarian cyst, unspecified side: Secondary | ICD-10-CM | POA: Diagnosis not present

## 2020-05-10 DIAGNOSIS — Z01818 Encounter for other preprocedural examination: Secondary | ICD-10-CM | POA: Diagnosis not present

## 2020-05-10 NOTE — Anesthesia Preprocedure Evaluation (Addendum)
Anesthesia Evaluation  Patient identified by MRN, date of birth, ID band Patient awake    Reviewed: Allergy & Precautions, NPO status , Patient's Chart, lab work & pertinent test results  Airway Mallampati: II  TM Distance: >3 FB Neck ROM: Full    Dental no notable dental hx. (+) Teeth Intact, Dental Advisory Given, Missing, Chipped,    Pulmonary neg pulmonary ROS, Patient abstained from smoking.,    Pulmonary exam normal breath sounds clear to auscultation       Cardiovascular hypertension, Pt. on medications Normal cardiovascular exam Rhythm:Regular Rate:Normal     Neuro/Psych PSYCHIATRIC DISORDERS  Neuromuscular disease    GI/Hepatic GERD  Medicated and Controlled,(+)     substance abuse  alcohol use,   Endo/Other  negative endocrine ROSObesity   Renal/GU negative Renal ROS     Musculoskeletal  (+) Arthritis , Osteoarthritis,    Abdominal   Peds  Hematology negative hematology ROS (+)   Anesthesia Other Findings Day of surgery medications reviewed with the patient.  Reproductive/Obstetrics                            Anesthesia Physical  Anesthesia Plan  ASA: III  Anesthesia Plan: General   Post-op Pain Management:    Induction: Intravenous  PONV Risk Score and Plan: 3 and Ondansetron, Dexamethasone and Treatment may vary due to age or medical condition  Airway Management Planned: Oral ETT  Additional Equipment: None  Intra-op Plan:   Post-operative Plan: Extubation in OR  Informed Consent: I have reviewed the patients History and Physical, chart, labs and discussed the procedure including the risks, benefits and alternatives for the proposed anesthesia with the patient or authorized representative who has indicated his/her understanding and acceptance.     Dental advisory given  Plan Discussed with: CRNA, Anesthesiologist and Surgeon  Anesthesia Plan Comments:         Anesthesia Quick Evaluation

## 2020-05-11 ENCOUNTER — Encounter (HOSPITAL_COMMUNITY)
Admission: RE | Disposition: A | Discharge status: Home / Self Care | Payer: Self-pay | Source: Ambulatory Visit | Attending: Obstetrics and Gynecology

## 2020-05-11 ENCOUNTER — Inpatient Hospital Stay (HOSPITAL_COMMUNITY)
Admission: RE | Admit: 2020-05-11 | Discharge: 2020-05-12 | DRG: 743 | Disposition: A | Discharge status: Home / Self Care | Payer: Medicare HMO | Source: Ambulatory Visit | Attending: Obstetrics and Gynecology | Admitting: Obstetrics and Gynecology

## 2020-05-11 ENCOUNTER — Encounter (HOSPITAL_COMMUNITY): Payer: Self-pay | Admitting: Obstetrics and Gynecology

## 2020-05-11 ENCOUNTER — Observation Stay (HOSPITAL_COMMUNITY): Payer: Medicare HMO | Admitting: Anesthesiology

## 2020-05-11 ENCOUNTER — Observation Stay (HOSPITAL_COMMUNITY): Payer: Medicare HMO | Admitting: Physician Assistant

## 2020-05-11 DIAGNOSIS — K219 Gastro-esophageal reflux disease without esophagitis: Secondary | ICD-10-CM | POA: Diagnosis present

## 2020-05-11 DIAGNOSIS — Z853 Personal history of malignant neoplasm of breast: Secondary | ICD-10-CM

## 2020-05-11 DIAGNOSIS — I10 Essential (primary) hypertension: Secondary | ICD-10-CM | POA: Diagnosis present

## 2020-05-11 DIAGNOSIS — N83209 Unspecified ovarian cyst, unspecified side: Secondary | ICD-10-CM | POA: Diagnosis present

## 2020-05-11 DIAGNOSIS — N83202 Unspecified ovarian cyst, left side: Principal | ICD-10-CM | POA: Diagnosis present

## 2020-05-11 DIAGNOSIS — Z20822 Contact with and (suspected) exposure to covid-19: Secondary | ICD-10-CM | POA: Diagnosis present

## 2020-05-11 HISTORY — PX: HYSTERECTOMY ABDOMINAL WITH SALPINGO-OOPHORECTOMY: SHX6792

## 2020-05-11 LAB — BASIC METABOLIC PANEL
Anion gap: 14 (ref 5–15)
BUN: 13 mg/dL (ref 8–23)
CO2: 26 mmol/L (ref 22–32)
Calcium: 9.5 mg/dL (ref 8.9–10.3)
Chloride: 95 mmol/L — ABNORMAL LOW (ref 98–111)
Creatinine, Ser: 0.88 mg/dL (ref 0.44–1.00)
GFR, Estimated: >60 mL/min (ref >60)
Glucose, Bld: 100 mg/dL — ABNORMAL HIGH (ref 70–99)
Potassium: 3.4 mmol/L — ABNORMAL LOW (ref 3.5–5.1)
Sodium: 135 mmol/L (ref 135–145)

## 2020-05-11 LAB — TYPE AND SCREEN
ABO/RH(D): O POS
Antibody Screen: NEGATIVE

## 2020-05-11 SURGERY — HYSTERECTOMY, ABDOMINAL, WITH SALPINGO-OOPHORECTOMY
Anesthesia: General | Laterality: Bilateral

## 2020-05-11 MED ORDER — ACETAMINOPHEN 10 MG/ML IV SOLN
1000.0000 mg | Freq: Once | INTRAVENOUS | Status: AC
  Administered 2020-05-11: 1000 mg via INTRAVENOUS

## 2020-05-11 MED ORDER — FENTANYL CITRATE (PF) 100 MCG/2ML IJ SOLN
25.0000 ug | INTRAMUSCULAR | Status: DC | PRN
  Administered 2020-05-11 (×3): 25 ug via INTRAVENOUS
  Administered 2020-05-11: 50 ug via INTRAVENOUS

## 2020-05-11 MED ORDER — ONDANSETRON HCL 4 MG/2ML IJ SOLN
INTRAMUSCULAR | Status: DC | PRN
  Administered 2020-05-11: 4 mg via INTRAVENOUS

## 2020-05-11 MED ORDER — SIMETHICONE 80 MG PO CHEW
80.0000 mg | CHEWABLE_TABLET | Freq: Four times a day (QID) | ORAL | Status: DC | PRN
Start: 2020-05-11 — End: 2020-05-12

## 2020-05-11 MED ORDER — CHLORHEXIDINE GLUCONATE 0.12 % MT SOLN
15.0000 mL | Freq: Once | OROMUCOSAL | Status: AC
  Administered 2020-05-11: 15 mL via OROMUCOSAL

## 2020-05-11 MED ORDER — ONDANSETRON HCL 4 MG/2ML IJ SOLN: 4.0000 mg | Freq: Once | INTRAMUSCULAR | Status: DC | PRN

## 2020-05-11 MED ORDER — EPHEDRINE SULFATE-NACL 50-0.9 MG/10ML-% IV SOSY
PREFILLED_SYRINGE | INTRAVENOUS | Status: DC | PRN
  Administered 2020-05-11: 5 mg via INTRAVENOUS

## 2020-05-11 MED ORDER — ROCURONIUM BROMIDE 10 MG/ML (PF) SYRINGE
PREFILLED_SYRINGE | INTRAVENOUS | Status: AC
  Filled 2020-05-11: qty 10

## 2020-05-11 MED ORDER — ORAL CARE MOUTH RINSE: 15.0000 mL | Freq: Once | OROMUCOSAL | Status: AC

## 2020-05-11 MED ORDER — PROPOFOL 10 MG/ML IV BOLUS
INTRAVENOUS | Status: AC
  Filled 2020-05-11: qty 40

## 2020-05-11 MED ORDER — ONDANSETRON HCL 4 MG/2ML IJ SOLN
INTRAMUSCULAR | Status: AC
  Filled 2020-05-11: qty 2

## 2020-05-11 MED ORDER — ONDANSETRON HCL 4 MG PO TABS: 4.0000 mg | ORAL_TABLET | Freq: Four times a day (QID) | ORAL | Status: DC | PRN

## 2020-05-11 MED ORDER — PANTOPRAZOLE SODIUM 40 MG PO TBEC
40.0000 mg | DELAYED_RELEASE_TABLET | Freq: Every day | ORAL | Status: DC
  Administered 2020-05-12: 09:00:00 40 mg via ORAL
  Filled 2020-05-11: qty 1

## 2020-05-11 MED ORDER — ONDANSETRON HCL 4 MG/2ML IJ SOLN: 4.0000 mg | Freq: Four times a day (QID) | INTRAMUSCULAR | Status: DC | PRN

## 2020-05-11 MED ORDER — ACETAMINOPHEN 160 MG/5ML PO SOLN: 325.0000 mg | ORAL | Status: DC | PRN

## 2020-05-11 MED ORDER — DEXTROSE IN LACTATED RINGERS 5 % IV SOLN: INTRAVENOUS | Status: DC

## 2020-05-11 MED ORDER — HYDROMORPHONE HCL 1 MG/ML IJ SOLN: 0.5000 mg | INTRAMUSCULAR | Status: DC | PRN

## 2020-05-11 MED ORDER — GABAPENTIN 100 MG PO CAPS
200.0000 mg | ORAL_CAPSULE | Freq: Three times a day (TID) | ORAL | Status: DC
  Administered 2020-05-11 – 2020-05-12 (×2): 200 mg via ORAL
  Filled 2020-05-11 (×2): qty 2

## 2020-05-11 MED ORDER — SODIUM CHLORIDE 0.9 % IV SOLN
2.0000 g | INTRAVENOUS | Status: AC
  Administered 2020-05-11: 2 g via INTRAVENOUS
  Filled 2020-05-11: qty 2

## 2020-05-11 MED ORDER — OXYCODONE HCL 5 MG/5ML PO SOLN: 5.0000 mg | Freq: Once | ORAL | Status: AC | PRN

## 2020-05-11 MED ORDER — LIDOCAINE HCL (PF) 2 % IJ SOLN
INTRAMUSCULAR | Status: AC
  Filled 2020-05-11: qty 5

## 2020-05-11 MED ORDER — MORPHINE SULFATE (PF) 2 MG/ML IV SOLN: 1.0000 mg | INTRAVENOUS | Status: DC | PRN

## 2020-05-11 MED ORDER — PROPOFOL 10 MG/ML IV BOLUS
INTRAVENOUS | Status: DC | PRN
  Administered 2020-05-11: 100 mg via INTRAVENOUS

## 2020-05-11 MED ORDER — FENTANYL CITRATE (PF) 100 MCG/2ML IJ SOLN
INTRAMUSCULAR | Status: DC | PRN
  Administered 2020-05-11 (×4): 50 ug via INTRAVENOUS

## 2020-05-11 MED ORDER — SODIUM CHLORIDE 0.9 % IR SOLN
Status: DC | PRN
  Administered 2020-05-11: 2000 mL
  Administered 2020-05-11: 1000 mL

## 2020-05-11 MED ORDER — SODIUM CHLORIDE (PF) 0.9 % IJ SOLN
INTRAMUSCULAR | Status: DC | PRN
  Administered 2020-05-11: 30 mL

## 2020-05-11 MED ORDER — FENTANYL CITRATE (PF) 100 MCG/2ML IJ SOLN
INTRAMUSCULAR | Status: AC
  Filled 2020-05-11: qty 2

## 2020-05-11 MED ORDER — DEXAMETHASONE SODIUM PHOSPHATE 10 MG/ML IJ SOLN
INTRAMUSCULAR | Status: AC
  Filled 2020-05-11: qty 1

## 2020-05-11 MED ORDER — AMLODIPINE BESYLATE 10 MG PO TABS
10.0000 mg | ORAL_TABLET | Freq: Every day | ORAL | Status: DC
  Administered 2020-05-12: 10 mg via ORAL
  Filled 2020-05-11: qty 1

## 2020-05-11 MED ORDER — SUGAMMADEX SODIUM 200 MG/2ML IV SOLN
INTRAVENOUS | Status: DC | PRN
  Administered 2020-05-11: 200 mg via INTRAVENOUS

## 2020-05-11 MED ORDER — HYDROMORPHONE HCL 1 MG/ML IJ SOLN
INTRAMUSCULAR | Status: AC
  Filled 2020-05-11: qty 1

## 2020-05-11 MED ORDER — MEPERIDINE HCL 50 MG/ML IJ SOLN: 6.2500 mg | INTRAMUSCULAR | Status: DC | PRN

## 2020-05-11 MED ORDER — ACETAMINOPHEN 10 MG/ML IV SOLN
INTRAVENOUS | Status: AC
  Filled 2020-05-11: qty 100

## 2020-05-11 MED ORDER — HYDROCODONE-ACETAMINOPHEN 5-325 MG PO TABS
1.0000 | ORAL_TABLET | ORAL | Status: DC | PRN
Start: 2020-05-11 — End: 2020-05-12
  Administered 2020-05-11 – 2020-05-12 (×3): 1 via ORAL
  Filled 2020-05-11 (×3): qty 1

## 2020-05-11 MED ORDER — ROCURONIUM BROMIDE 10 MG/ML (PF) SYRINGE
PREFILLED_SYRINGE | INTRAVENOUS | Status: DC | PRN
  Administered 2020-05-11: 60 mg via INTRAVENOUS

## 2020-05-11 MED ORDER — ACETAMINOPHEN 325 MG PO TABS: 325.0000 mg | ORAL_TABLET | ORAL | Status: DC | PRN

## 2020-05-11 MED ORDER — BUPIVACAINE LIPOSOME 1.3 % IJ SUSP
20.0000 mL | Freq: Once | INTRAMUSCULAR | Status: AC
  Administered 2020-05-11: 20 mL
  Filled 2020-05-11: qty 20

## 2020-05-11 MED ORDER — BUPIVACAINE HCL (PF) 0.25 % IJ SOLN
INTRAMUSCULAR | Status: AC
  Filled 2020-05-11: qty 30

## 2020-05-11 MED ORDER — LIDOCAINE 2% (20 MG/ML) 5 ML SYRINGE
INTRAMUSCULAR | Status: DC | PRN
  Administered 2020-05-11: 60 mg via INTRAVENOUS
  Administered 2020-05-11: 1.5 mg/kg/h via INTRAVENOUS
  Administered 2020-05-11: 40 mg via INTRAVENOUS

## 2020-05-11 MED ORDER — DEXAMETHASONE SODIUM PHOSPHATE 10 MG/ML IJ SOLN
INTRAMUSCULAR | Status: DC | PRN
  Administered 2020-05-11: 10 mg via INTRAVENOUS

## 2020-05-11 MED ORDER — SODIUM CHLORIDE (PF) 0.9 % IJ SOLN
INTRAMUSCULAR | Status: AC
  Filled 2020-05-11: qty 30

## 2020-05-11 MED ORDER — MENTHOL 3 MG MT LOZG: 1.0000 | LOZENGE | OROMUCOSAL | Status: DC | PRN

## 2020-05-11 MED ORDER — DEXMEDETOMIDINE (PRECEDEX) IN NS 20 MCG/5ML (4 MCG/ML) IV SYRINGE
PREFILLED_SYRINGE | INTRAVENOUS | Status: DC | PRN
  Administered 2020-05-11: 8 ug via INTRAVENOUS

## 2020-05-11 MED ORDER — OXYCODONE HCL 5 MG PO TABS
ORAL_TABLET | ORAL | Status: AC
  Filled 2020-05-11: qty 1

## 2020-05-11 MED ORDER — BUPIVACAINE HCL 0.25 % IJ SOLN
INTRAMUSCULAR | Status: DC | PRN
  Administered 2020-05-11: 30 mL

## 2020-05-11 MED ORDER — LACTATED RINGERS IV SOLN: INTRAVENOUS | Status: DC

## 2020-05-11 MED ORDER — OXYCODONE HCL 5 MG PO TABS
5.0000 mg | ORAL_TABLET | Freq: Once | ORAL | Status: AC | PRN
  Administered 2020-05-11: 5 mg via ORAL

## 2020-05-11 MED ORDER — POVIDONE-IODINE 10 % EX SWAB
2.0000 "application " | Freq: Once | CUTANEOUS | Status: AC
  Administered 2020-05-11: 2 via TOPICAL

## 2020-05-11 MED ORDER — LIDOCAINE HCL 2 % IJ SOLN
INTRAMUSCULAR | Status: AC
  Filled 2020-05-11: qty 20

## 2020-05-11 SURGICAL SUPPLY — 51 items
APL PRP STRL LF DISP 70% ISPRP (MISCELLANEOUS) ×1
APL SKNCLS STERI-STRIP NONHPOA (GAUZE/BANDAGES/DRESSINGS) ×1
BARRIER ADHS 3X4 INTERCEED (GAUZE/BANDAGES/DRESSINGS) IMPLANT
BENZOIN TINCTURE PRP APPL 2/3 (GAUZE/BANDAGES/DRESSINGS) ×1 IMPLANT
BRR ADH 4X3 ABS CNTRL BYND (GAUZE/BANDAGES/DRESSINGS)
CANISTER SUCT 3000ML PPV (MISCELLANEOUS) ×2 IMPLANT
CHLORAPREP W/TINT 26 (MISCELLANEOUS) ×2 IMPLANT
CNTNR URN SCR LID CUP LEK RST (MISCELLANEOUS) ×1 IMPLANT
CONT SPEC 4OZ STRL OR WHT (MISCELLANEOUS) ×2
COVER WAND RF STERILE (DRAPES) IMPLANT
DECANTER SPIKE VIAL GLASS SM (MISCELLANEOUS) IMPLANT
DRAPE CESAREAN BIRTH W POUCH (DRAPES) ×2 IMPLANT
DRAPE WARM FLUID 44X44 (DRAPES) ×2 IMPLANT
DRSG OPSITE POSTOP 4X10 (GAUZE/BANDAGES/DRESSINGS) ×3 IMPLANT
GAUZE 4X4 16PLY RFD (DISPOSABLE) IMPLANT
GLOVE SURG ENC MOIS LTX SZ7 (GLOVE) ×2 IMPLANT
GLOVE SURG UNDER POLY LF SZ7 (GLOVE) ×4 IMPLANT
GOWN STRL REUS W/TWL LRG LVL3 (GOWN DISPOSABLE) ×6 IMPLANT
HEMOSTAT ARISTA ABSORB 3G PWDR (HEMOSTASIS) ×1 IMPLANT
HOLDER FOLEY CATH W/STRAP (MISCELLANEOUS) ×1 IMPLANT
KIT TURNOVER KIT A (KITS) ×2 IMPLANT
NDL SAFETY ECLIPSE 18X1.5 (NEEDLE) IMPLANT
NDL SPNL 22GX3.5 QUINCKE BK (NEEDLE) ×1 IMPLANT
NEEDLE HYPO 18GX1.5 SHARP (NEEDLE) ×2
NEEDLE SPNL 22GX3.5 QUINCKE BK (NEEDLE) ×2 IMPLANT
NS IRRIG 1000ML POUR BTL (IV SOLUTION) ×2 IMPLANT
PACK ABDOMINAL GYN (CUSTOM PROCEDURE TRAY) ×2 IMPLANT
PAD OB MATERNITY 4.3X12.25 (PERSONAL CARE ITEMS) ×2 IMPLANT
PENCIL SMOKE EVACUATOR (MISCELLANEOUS) IMPLANT
PROTECTOR NERVE ULNAR (MISCELLANEOUS) ×2 IMPLANT
RTRCTR C-SECT PINK 25CM LRG (MISCELLANEOUS) ×2 IMPLANT
SPONGE LAP 18X18 RF (DISPOSABLE) IMPLANT
STRIP CLOSURE SKIN 1/2X4 (GAUZE/BANDAGES/DRESSINGS) ×1 IMPLANT
SUT CHROMIC 3 0 SH 27 (SUTURE) IMPLANT
SUT MNCRL AB 3-0 PS2 18 (SUTURE) ×1 IMPLANT
SUT MON AB 2-0 CT1 36 (SUTURE) IMPLANT
SUT MON AB 4-0 PS1 27 (SUTURE) ×2 IMPLANT
SUT PDS AB 0 CT1 27 (SUTURE) ×4 IMPLANT
SUT VIC AB 0 CT1 18XCR BRD8 (SUTURE) ×2 IMPLANT
SUT VIC AB 0 CT1 27 (SUTURE) ×2
SUT VIC AB 0 CT1 27XBRD ANBCTR (SUTURE) ×1 IMPLANT
SUT VIC AB 0 CT1 8-18 (SUTURE) ×8
SUT VIC AB 2-0 CT1 27 (SUTURE)
SUT VIC AB 2-0 CT1 TAPERPNT 27 (SUTURE) IMPLANT
SUT VIC AB 3-0 CT1 27 (SUTURE) ×2
SUT VIC AB 3-0 CT1 TAPERPNT 27 (SUTURE) ×1 IMPLANT
SUT VICRYL 0 TIES 12 18 (SUTURE) ×2 IMPLANT
SYR 20ML LL LF (SYRINGE) ×2 IMPLANT
SYR 50ML LL SCALE MARK (SYRINGE) ×2 IMPLANT
TOWEL OR 17X26 10 PK STRL BLUE (TOWEL DISPOSABLE) ×4 IMPLANT
TRAY FOLEY MTR SLVR 14FR STAT (SET/KITS/TRAYS/PACK) ×2 IMPLANT

## 2020-05-11 NOTE — Anesthesia Procedure Notes (Signed)
Procedure Name: Intubation Date/Time: 05/11/2020 7:39 AM Performed by: Lavina Hamman, CRNA Pre-anesthesia Checklist: Patient identified, Emergency Drugs available, Suction available, Patient being monitored and Timeout performed Patient Re-evaluated:Patient Re-evaluated prior to induction Oxygen Delivery Method: Circle system utilized Preoxygenation: Pre-oxygenation with 100% oxygen Induction Type: IV induction Ventilation: Mask ventilation without difficulty Laryngoscope Size: Mac and 3 Grade View: Grade I Tube type: Oral Tube size: 7.0 mm Number of attempts: 1 Airway Equipment and Method: Stylet Placement Confirmation: ETT inserted through vocal cords under direct vision,  positive ETCO2,  breath sounds checked- equal and bilateral and CO2 detector Secured at: 21 cm Tube secured with: Tape Dental Injury: Teeth and Oropharynx as per pre-operative assessment

## 2020-05-11 NOTE — Op Note (Signed)
Preoperative diagnosis: Left ovarian cyst  Postoperative diagnosis: Same  Procedure: TAH/BSO, cyst fluid for cytology  Surgeon: Ronny Flurry  Assistant: Mardelle Matte  EBL: 75 cc  Procedure and findings:  Patient taken the operating room after an adequate level of general anesthesia was obtained patient supine the abdomen prepped and draped the vagina was prepped separately and a Foley catheter was position.  Propria timeouts were taken at that point.  After draping, transverse incision was made 2 fingerbreadths above the symphysis carried down to the fascia which was incised and extended transversely.  Rectus muscle divided in the midline, peritoneum entered superiorly without incident and extended in a vertical fashion.  An Alexis retractor was positioned and the bowels packed superiorly out of the field.  Large smooth-walled 15 cm left ovarian cyst was noted there was no free fluid noted the review remainder of the pelvic findings were unremarkable.  Starting on the left round ligament was clamped divided and suture ligated the peritoneum carried around to the midline in the exact same repeated on the opposite side.  The bladder was then dissected with sharp and blunt dissection below the cervicovaginal junction.  Starting on the right the IP ligament was isolated clamped tied and after free tying.  This was done after assuring that the course of the ureter was well below.  On the left, we left the cyst temporarily after draining several 100 cc of clear fluid, it did decompress but could not be readily elevated.  The left utero-ovarian pedicle was then doubly clamped and tied, and the ovary was conserved temporarily.  Starting on the left uterine vascular pedicle was skeletonized clamped divided and suture ligated with 0 Vicryl suture.  The exact same repeated on the opposite side.  In sequential manner staying close to the uterus the cardinal ligament, uterosacral ligaments and cervical vaginal pedicles were  clamped divided and suture ligated with 0 Vicryl.  The specimen removed/excised.  Cuff was mostly closed at that point one additional figure-of-eight suture in the midpoint to secure closure.  This was hemostatic.  At this point dissection of the left ovarian cyst was continued, the retroperitoneal space on the left was opened from the round ligament toward the left IP ligament.  The left IP ligament was then clamped divided and first free time followed by suture ligature 0 Vicryl.  This was done after assuring that the course of the left ureter was well below.  With the left ovarian cyst then on traction, was next dissected with both blunt and sharp dissection in an avascular plane, again showing that the course of the ureter was well below.  This was dissected down to one final small pedicle that was clamped divided and the specimen was then sent to pathology along with the uterus the right tube and ovary.  The pelvis is irrigated with saline and the retroperitoneal space on that side was inspected carefully, of the left ureter could be seen and palpated on the medial peritoneal reflection below the operative site.  Cuff area was hemostatic.  Arista was used in the retroperitoneal space for some minimal oozing.  Prior to closure sponge, needle, instrument counts reported as correct x2.  Peritoneum was closed with a running 2-0 Vicryl suture same on the rectus muscles in the midline 0 PDS were used to close the fascia from laterally to midline on either side.  Subcutaneous tissue was fairly thin and was hemostatic was not closed separately.  Diluted Exparel solution solution, mixed with quarter percent Marcaine  and saline was injected subfascially and subcutaneously for postoperative analgesia.  4-0 Monocryl's skin closure with half inch Steri-Strips with benzoin and honeycomb dressing.  Clear urine noted at the end of the case she tolerated this well went to recovery room in good condition.  Dictated with  [place] 1  Araseli Sherry M. Maxie Barb, MD

## 2020-05-11 NOTE — Progress Notes (Signed)
The patient was re-examined with no change in status. Pt discussed with me pre-op that she consumes significant alcohol, I checked her full CMET yesterday and it was all WNL, including LFT's

## 2020-05-11 NOTE — Progress Notes (Signed)
Alert and O X 3 BP (!) 147/83 (BP Location: Left Arm)   Pulse 73   Temp 98.1 F (36.7 C)   Resp 16   Ht 5' (1.524 m)   Wt 66.2 kg   SpO2 100%   BMI 28.51 kg/m   Clear UOP, dressing dry, will incr diet, check CBC in am

## 2020-05-11 NOTE — Transfer of Care (Signed)
Immediate Anesthesia Transfer of Care Note  Patient: TERRE HANNEMAN  Procedure(s) Performed: TOTAL ABDOMINAL HYSTERECTOMY WITH BILATERAL SALPINGO-OOPHORECTOMY (Bilateral )  Patient Location: PACU  Anesthesia Type:General  Level of Consciousness: awake, alert  and oriented  Airway & Oxygen Therapy: Patient Spontanous Breathing and Patient connected to face mask oxygen  Post-op Assessment: Report given to RN and Patient moving all extremities X 4  Post vital signs: Reviewed and stable  Last Vitals:  Vitals Value Taken Time  BP 145/78 05/11/20 0915  Temp    Pulse 81 05/11/20 0917  Resp 17 05/11/20 0917  SpO2 100 % 05/11/20 0917  Vitals shown include unvalidated device data.  Last Pain:  Vitals:   05/11/20 0606  TempSrc: Oral  PainSc:       Patients Stated Pain Goal: 3 (48/54/62 7035)  Complications: No complications documented.

## 2020-05-11 NOTE — Anesthesia Postprocedure Evaluation (Signed)
Anesthesia Post Note  Patient: DUSTY WAGONER  Procedure(s) Performed: TOTAL ABDOMINAL HYSTERECTOMY WITH BILATERAL SALPINGO-OOPHORECTOMY (Bilateral )     Patient location during evaluation: PACU Anesthesia Type: General Level of consciousness: awake and alert Pain management: pain level controlled Vital Signs Assessment: post-procedure vital signs reviewed and stable Respiratory status: spontaneous breathing, nonlabored ventilation, respiratory function stable and patient connected to nasal cannula oxygen Cardiovascular status: blood pressure returned to baseline and stable Postop Assessment: no apparent nausea or vomiting Anesthetic complications: no   No complications documented.  Last Vitals:  Vitals:   05/11/20 1015 05/11/20 1030  BP: (!) 156/76 136/72  Pulse: 75 74  Resp: 15 12  Temp: 36.7 C   SpO2: 98% 98%    Last Pain:  Vitals:   05/11/20 1030  TempSrc:   PainSc: Asleep                 Brylee Berk

## 2020-05-12 ENCOUNTER — Encounter (HOSPITAL_COMMUNITY): Payer: Self-pay | Admitting: Obstetrics and Gynecology

## 2020-05-12 LAB — CBC
HCT: 28.5 % — ABNORMAL LOW (ref 36.0–46.0)
Hemoglobin: 9.7 g/dL — ABNORMAL LOW (ref 12.0–15.0)
MCH: 32.1 pg (ref 26.0–34.0)
MCHC: 34 g/dL (ref 30.0–36.0)
MCV: 94.4 fL (ref 80.0–100.0)
Platelets: 198 10*3/uL (ref 150–400)
RBC: 3.02 MIL/uL — ABNORMAL LOW (ref 3.87–5.11)
RDW: 13 % (ref 11.5–15.5)
WBC: 9.6 10*3/uL (ref 4.0–10.5)
nRBC: 0 % (ref 0.0–0.2)

## 2020-05-12 LAB — CYTOLOGY - NON PAP

## 2020-05-12 MED ORDER — IBUPROFEN 200 MG PO TABS: 800.0000 mg | ORAL_TABLET | Freq: Three times a day (TID) | ORAL | 0 refills | Status: DC | PRN

## 2020-05-12 NOTE — Progress Notes (Signed)
Discharge instructions discussed with patient, verbalized agreement and understanding 

## 2020-05-12 NOTE — Progress Notes (Signed)
1 Day Post-Op Procedure(s) (LRB): TOTAL ABDOMINAL HYSTERECTOMY WITH BILATERAL SALPINGO-OOPHORECTOMY (Bilateral)  Subjective: Patient reports tolerating PO.  Cath just removed   Objective: I have reviewed patient's vital signs and labs. BP 138/79 (BP Location: Left Arm)   Pulse 70   Temp 98.8 F (37.1 C) (Oral)   Resp 18   Ht 5' (1.524 m)   Wt 66.2 kg   SpO2 96%   BMI 28.51 kg/m  CBC    Component Value Date/Time   WBC 9.6 05/12/2020 0506   RBC 3.02 (L) 05/12/2020 0506   HGB 9.7 (L) 05/12/2020 0506   HGB 11.2 11/25/2019 1554   HGB 9.2 (L) 08/02/2014 0924   HCT 28.5 (L) 05/12/2020 0506   HCT 31.9 (L) 11/25/2019 1554   HCT 28.3 (L) 08/02/2014 0924   PLT 198 05/12/2020 0506   PLT 309 11/25/2019 1554   MCV 94.4 05/12/2020 0506   MCV 90 11/25/2019 1554   MCV 88.7 08/02/2014 0924   MCH 32.1 05/12/2020 0506   MCHC 34.0 05/12/2020 0506   RDW 13.0 05/12/2020 0506   RDW 15.3 11/25/2019 1554   RDW 15.2 (H) 08/02/2014 0924   LYMPHSABS 2,279 07/10/2015 1504   LYMPHSABS 2.8 08/02/2014 0924   MONOABS 530 07/10/2015 1504   MONOABS 0.9 08/02/2014 0924   EOSABS 265 07/10/2015 1504   EOSABS 0.2 08/02/2014 0924   BASOSABS 53 07/10/2015 1504   BASOSABS 0.0 08/02/2014 0924    Assessment: s/p Procedure(s) with comments: TOTAL ABDOMINAL HYSTERECTOMY WITH BILATERAL SALPINGO-OOPHORECTOMY (Bilateral) - NEEDS BED: pt says she is reasdy for D/C>>abd soft + BS>>.dressing dry  Plan: Discharge home>>will plan to see 1 week  LOS: 1 day    Margarette Asal 05/12/2020, 9:11 AM

## 2020-05-12 NOTE — Discharge Summary (Signed)
Physician Discharge Summary  Patient ID: Carrie [name] MRN: [id number] DOB/AGE: 68/23/1952 68 y.o.  Admit date: 05/11/2020 Discharge date: 05/12/2020  Admission Diagnoses:L ovarian cyst  Discharge Diagnoses: same Active Problems:   Ovarian cyst   Discharged Condition: good  Hospital Course: adm for TAHBSO, on POD 1, was afeb, tol PO and ready for D/C  Consults: None  Significant Diagnostic Studies: labs:  Results for orders placed or performed during the hospital encounter of 05/11/20 (from the past 24 hour(s))  CBC     Status: Abnormal   Collection Time: 05/12/20  5:06 AM  Result Value Ref Range   WBC 9.6 4.0 - 10.5 K/uL   RBC 3.02 (L) 3.87 - 5.11 MIL/uL   Hemoglobin 9.7 (L) 12.0 - 15.0 g/dL   HCT 28.5 (L) 36.0 - 46.0 %   MCV 94.4 80.0 - 100.0 fL   MCH 32.1 26.0 - 34.0 pg   MCHC 34.0 30.0 - 36.0 g/dL   RDW 13.0 11.5 - 15.5 %   Platelets 198 150 - 400 K/uL   nRBC 0.0 0.0 - 0.2 %    Treatments: surgery: TAHBSO  Discharge Exam: Blood pressure 138/79, pulse 70, temperature 98.8 F (37.1 C), temperature source Oral, resp. rate 18, height 5' (1.524 m), weight 66.2 kg, SpO2 96 %. abd soft + BS, dressing dry  Disposition: Discharge disposition: 01-Home or Self Care          Follow-up Information    Molli Posey, MD. Schedule an appointment as soon as possible for a visit in 1 week(s).   Specialty: Obstetrics and Gynecology Contact information: [address] [place] [address] [phone]               Signed: Margarette Asal 05/12/2020, 9:19 AM

## 2020-05-16 ENCOUNTER — Ambulatory Visit (INDEPENDENT_AMBULATORY_CARE_PROVIDER_SITE_OTHER): Payer: Medicare HMO | Admitting: Orthopaedic Surgery

## 2020-05-16 VITALS — BP 129/84 | HR 80

## 2020-05-16 DIAGNOSIS — G8929 Other chronic pain: Secondary | ICD-10-CM

## 2020-05-16 DIAGNOSIS — M48062 Spinal stenosis, lumbar region with neurogenic claudication: Secondary | ICD-10-CM

## 2020-05-16 DIAGNOSIS — M545 Low back pain, unspecified: Secondary | ICD-10-CM

## 2020-05-16 LAB — SURGICAL PATHOLOGY

## 2020-05-16 NOTE — Progress Notes (Signed)
Office Visit Note   Patient: Carrie [name]           Date of Birth: [birth year]           MRN: [id number] Visit Date: 05/16/2020              Requested by: Anderson, Chelsey L, DO [place] [id number] PCP: Richarda Osmond, DO   Assessment & Plan: Visit Diagnoses:  1. Chronic left-sided low back pain without sciatica   2. Spinal stenosis of lumbar region with neurogenic claudication   3.    Previous right TKA, moderate left knee OA.  Plan: Patient states she is better since her hysterectomy surgery some of this may be due to decreased activity level postop as well as some pain medication when she is weaning down.  I [name] recheck her in 2 months.  Follow-Up Instructions: Return in about 2 months (around 07/14/2020).   Orders:  No orders of the defined types were placed in this encounter.  No orders of the defined types were placed in this encounter.     Procedures: No procedures performed   Clinical Data: No additional findings.   Subjective: Chief Complaint  Patient presents with  . Left Knee - Follow-up    HPI 70 year old female returns with ongoing problems with some moderate left knee osteoarthritis.  She had previous total knee arthroplasty in her right knee in February 2018.  She has had some ongoing problems with tingling in her legs and feet and had a myelogram CT scan which showed grade 1 anterolisthesis L5-S1 with degenerative facets and mild shifting on flexion-extension.  She also had central disc protrusion at L3-4 and at times has problems with tingling in her legs and sometimes numbness in her feet but denies neurogenic claudication symptoms currently.  She states since she had her hysterectomy she her knee and her back and tingling in her legs seem to be doing better.  Review of Systems no chills or fever no bowel bladder symptoms.  All other systems updated unchanged from 01/14/2020 office visit.   Objective: Vital Signs: BP  129/84   Pulse 80   Physical Exam Constitutional:      Appearance: She is well-developed.  HENT:     Head: Normocephalic.     Right Ear: External ear normal.     Left Ear: External ear normal.  Eyes:     Pupils: Pupils are equal, round, and reactive to light.  Neck:     Thyroid: No thyromegaly.     Trachea: No tracheal deviation.  Cardiovascular:     Rate and Rhythm: Normal rate.  Pulmonary:     Effort: Pulmonary effort is normal.  Abdominal:     Palpations: Abdomen is soft.  Skin:    General: Skin is warm and dry.  Neurological:     Mental Status: She is alert and oriented to person, place, and time.  Psychiatric:        Mood and Affect: Mood and affect normal.        Behavior: Behavior normal.     Ortho Exam patient gets from sitting to standing slowly with recent hysterectomy heel toe gait noted.  Negative straight leg raising 90 degrees.  Anterior tib EHL is active.  She has some pain with static palpation.  Trace left knee effusion no effusion right knee.  Knee has good flexion on the right and full extension.  Quad takes good resistance.  Specialty Comments:  No specialty comments available.  Imaging: No results found.   PMFS History: Patient Active Problem List   Diagnosis Date Noted  . Ovarian cyst 05/11/2020  . Balance problem 11/30/2019  . Right leg pain 09/22/2019  . Need for immunization against influenza 02/25/2019  . Seasonal allergies 11/18/2018  . Dysuria 10/02/2018  . Lipoma of face 09/06/2018  . Bug bite 09/06/2018  . Numbness and tingling of foot 10/14/2017  . Abdominal pain, right upper quadrant 05/16/2017  . Neuropathy 04/17/2017  . Cramping of feet 03/14/2017  . Mild tetrahydrocannabinol (THC) abuse 11/20/2016  . Pain in right foot 09/01/2016  . Impingement syndrome of left shoulder 09/01/2016  . Bilateral sensorineural hearing loss 07/18/2016  . Subjective tinnitus of both ears 07/18/2016  . Spinal stenosis of lumbar region 02/14/2016   . Eustachian tube dysfunction 09/04/2015  . Back pain of thoracolumbar region 05/08/2015  . Epidermoid cyst of skin 01/10/2015  . Muscle cramping 12/05/2014  . Low back pain 06/14/2014  . Preventative health care 03/15/2014  . Diabetes mellitus screening 02/22/2014  . Bilateral knee pain 02/08/2014  . Transaminitis 07/23/2013  . Essential hypertension, benign 07/23/2013  . GERD (gastroesophageal reflux disease) 07/23/2013  . Vitamin D deficiency 07/22/2013  . Breast cancer, left breast ([place]) 07/17/2011  . Steatosis 07/17/2011  . FIBROIDS, UTERUS 01/30/2007  . Alcohol abuse 01/30/2007   Past Medical History:  Diagnosis Date  . Arthritis    "back, right hand" (07/11/2014)  . Breast cancer [facility]) 2011   right breast  . Breast cancer, right breast ([place]) 05/2009  . Chronic lower back pain   . GERD (gastroesophageal reflux disease)   . Hepatic steatosis 07/17/2011  . Hypertension   . Hypokalemia 07/17/2011  . Lower GI bleeding 07/11/2014 hospitalized  . Neuromuscular disorder (HCC)    numbness in legs and feet  . Personal history of radiation therapy 2011    Family History  Problem Relation Age of Onset  . Cancer Other   . Diabetes Mother   . Hypertension Mother   . Diabetes Brother   . Hypertension Father   . Arthritis/Rheumatoid Father   . Cancer Father   . Cancer Maternal Grandfather   . Heart disease Brother     Past Surgical History:  Procedure Laterality Date  . BREAST BIOPSY Right 2011  . BREAST LUMPECTOMY Right 2011  . COLONOSCOPY    . ESOPHAGOGASTRODUODENOSCOPY N/A 07/12/2014   Procedure: ESOPHAGOGASTRODUODENOSCOPY (EGD);  Surgeon: Clarene Essex, MD;  Location[facility] ENDOSCOPY;  Service: Endoscopy;  Laterality: N/A;  . FINGER SURGERY     middle finger on left hand  . HYSTERECTOMY ABDOMINAL WITH SALPINGO-OOPHORECTOMY Bilateral 05/11/2020   Procedure: TOTAL ABDOMINAL HYSTERECTOMY WITH BILATERAL SALPINGO-OOPHORECTOMY;  Surgeon: Molli Posey, MD;  Location: [facility];   Service: Gynecology;  Laterality: Bilateral;  NEEDS BED  . MASTECTOMY COMPLETE / SIMPLE W/ SENTINEL NODE BIOPSY  05/2009   Archie Endo 05/23/2009  . TOTAL KNEE ARTHROPLASTY Right 10/23/2016  . TOTAL KNEE ARTHROPLASTY Right 10/23/2016   Procedure: RIGHT TOTAL KNEE ARTHROPLASTY;  Surgeon: [name], MD;  Location: [place];  Service: Orthopedics;  Laterality: Right;   Social History   Occupational History  . Occupation: retired    Comment: housekeeping  Tobacco Use  . Smoking status: Never Smoker  . Smokeless tobacco: Never Used  Vaping Use  . Vaping Use: Never used  Substance and Sexual Activity  . Alcohol use: Yes    Alcohol/week: 14.0 standard drinks    Types: 10 Glasses  of wine, 4 Shots of liquor per week    Comment: daily since her 20s several a day   . Drug use: Yes    Frequency: 7.0 times per week    Types: Marijuana  . Sexual activity: Yes    Birth control/protection: Post-menopausal, Condom    Comment: 2 partners in last year without condom use

## 2020-05-22 DIAGNOSIS — Z09 Encounter for follow-up examination after completed treatment for conditions other than malignant neoplasm: Secondary | ICD-10-CM | POA: Diagnosis not present

## 2020-05-31 ENCOUNTER — Encounter: Payer: Self-pay | Admitting: Surgery

## 2020-05-31 ENCOUNTER — Ambulatory Visit (INDEPENDENT_AMBULATORY_CARE_PROVIDER_SITE_OTHER): Payer: Medicare HMO | Admitting: Surgery

## 2020-05-31 ENCOUNTER — Ambulatory Visit: Payer: Self-pay

## 2020-05-31 DIAGNOSIS — M79672 Pain in left foot: Secondary | ICD-10-CM

## 2020-05-31 DIAGNOSIS — S93401A Sprain of unspecified ligament of right ankle, initial encounter: Secondary | ICD-10-CM

## 2020-05-31 DIAGNOSIS — M25559 Pain in unspecified hip: Secondary | ICD-10-CM | POA: Diagnosis not present

## 2020-05-31 DIAGNOSIS — M25561 Pain in right knee: Secondary | ICD-10-CM | POA: Diagnosis not present

## 2020-05-31 DIAGNOSIS — S83511D Sprain of anterior cruciate ligament of right knee, subsequent encounter: Secondary | ICD-10-CM

## 2020-05-31 DIAGNOSIS — S7001XA Contusion of right hip, initial encounter: Secondary | ICD-10-CM | POA: Diagnosis not present

## 2020-05-31 DIAGNOSIS — S9032XA Contusion of left foot, initial encounter: Secondary | ICD-10-CM | POA: Diagnosis not present

## 2020-05-31 DIAGNOSIS — M25571 Pain in right ankle and joints of right foot: Secondary | ICD-10-CM | POA: Diagnosis not present

## 2020-05-31 DIAGNOSIS — M25572 Pain in left ankle and joints of left foot: Secondary | ICD-10-CM | POA: Diagnosis not present

## 2020-05-31 NOTE — Progress Notes (Signed)
Office Visit Note   Patient: Carrie [name]           Date of Birth: June 08, 1950           MRN: [id number] Visit Date: 05/31/2020              Requested by: Anderson, Chelsey L, DO [place],  [place] [id number] PCP: Richarda Osmond, DO   Assessment & Plan: Visit Diagnoses:  1. Right knee pain, unspecified chronicity   2. Hip pain   3. Left foot pain   4. Contusion of left foot, initial encounter   5. Contusion of right hip, initial encounter   6. Sprain of anterior cruciate ligament of right knee, subsequent encounter   7. Sprain of unspecified ligament of right ankle, initial encounter     Plan: Patient has multiple complaints related to her recent fall.  For right ankle she was put in a ASO brace.  For left foot she was given a postop shoe.  She was encouraged to use these.  Patient given prescription for a walker.  She understands that all areas should gradually resolve.  I do not think she needs bracing for the right knee where she has had total knee replacement.  She will follow-up with Dr. Lorin Mercy in 1 week for recheck of all of her issues.  He can decide at that time if further imaging studies are indicated.  Patient continues to describe ongoing chronic issues with low back pain and left lower extremity radiculopathy with neurogenic claudication symptoms.  She states that she disposal follow-up with Dr. Lorin Mercy as well to discuss surgery there.  Follow-Up Instructions: Return in about 1 week (around 06/07/2020) for With Dr. Lorin Mercy for recheck of multiple injuries.   Orders:  Orders Placed This Encounter  Procedures  . XR HIP UNILAT W OR W/O PELVIS 2-3 VIEWS RIGHT  . XR Knee 1-2 Views Right  . XR Foot Complete Left   No orders of the defined types were placed in this encounter.     Procedures: No procedures performed   Clinical Data: No additional findings.   Subjective: Chief Complaint  Patient presents with  . Right Hip - Injury, Pain    S/p fall  05/26/20  . Right Knee - Injury, Pain    S/p fall 05/26/20  . Left Foot - Pain, Injury    S/p fall 05/26/20    HPI 70 year old white female comes in today with multiple complaints. Patient states that fibber 25 2022 she was walking up 4 steps in her home when she tripped over her shoelace.  States that she fell on her left side.  She complains of bilateral buttock pain, right knee pain, right ankle pain and left foot pain.  She had previous right total knee replacement by Dr. Lorin Mercy July 2018.  Patient also has a known history of lumbar stenosis and Dr. Lorin Mercy as previously discussed doing surgery there.  She was just seen by Dr. Lorin Mercy here in the office May 16, 2020.  States that she has some swelling and bruising of her left foot.  Also has some pain anterolateral ankle and right medial knee.  It is difficult for her to weight-bear due to pain with all these areas.  She also continues to complain of pain that is radiating down her left leg from her back along with numbness and tingling in her feet. Review of Systems No current cardiac pulmonary GI GU issues  Objective: Vital Signs:  There were no vitals taken for this visit.  Physical Exam HENT:     Head: Normocephalic.  Pulmonary:     Effort: No respiratory distress.  Musculoskeletal:     Comments: Patient has mild left greater than right buttock tenderness.  Negative logroll bilateral hips.  Negative straight leg raise.  Right knee she has tenderness along the medial side around the MCL.  Ligaments are stable.  No palpable effusion.  Good knee range of motion.  Right ankle ligaments are stable.  Mild to moderate tenderness at the ATFL.  Left foot shows mild swelling bruising across the dorsal forefoot.  This area is somewhat tender to palpation.  Neurological:     Mental Status: She is alert and oriented to person, place, and time.     Ortho Exam  Specialty Comments:  No specialty comments available.  Imaging: No results  found.   PMFS History: Patient Active Problem List   Diagnosis Date Noted  . Ovarian cyst 05/11/2020  . Balance problem 11/30/2019  . Right leg pain 09/22/2019  . Need for immunization against influenza 02/25/2019  . Seasonal allergies 11/18/2018  . Dysuria 10/02/2018  . Lipoma of face 09/06/2018  . Bug bite 09/06/2018  . Numbness and tingling of foot 10/14/2017  . Abdominal pain, right upper quadrant 05/16/2017  . Neuropathy 04/17/2017  . Cramping of feet 03/14/2017  . Mild tetrahydrocannabinol (THC) abuse 11/20/2016  . Pain in right foot 09/01/2016  . Impingement syndrome of left shoulder 09/01/2016  . Bilateral sensorineural hearing loss 07/18/2016  . Subjective tinnitus of both ears 07/18/2016  . Spinal stenosis of lumbar region 02/14/2016  . Eustachian tube dysfunction 09/04/2015  . Back pain of thoracolumbar region 05/08/2015  . Epidermoid cyst of skin 01/10/2015  . Muscle cramping 12/05/2014  . Low back pain 06/14/2014  . Preventative health care 03/15/2014  . Diabetes mellitus screening 02/22/2014  . Bilateral knee pain 02/08/2014  . Transaminitis 07/23/2013  . Essential hypertension, benign 07/23/2013  . GERD (gastroesophageal reflux disease) 07/23/2013  . Vitamin D deficiency 07/22/2013  . Breast cancer, left breast ([place]) 07/17/2011  . Steatosis 07/17/2011  . FIBROIDS, UTERUS 01/30/2007  . Alcohol abuse 01/30/2007   Past Medical History:  Diagnosis Date  . Arthritis    "back, right hand" (07/11/2014)  . Breast cancer [facility]) 2011   right breast  . Breast cancer, right breast ([place]) 05/2009  . Chronic lower back pain   . GERD (gastroesophageal reflux disease)   . Hepatic steatosis 07/17/2011  . Hypertension   . Hypokalemia 07/17/2011  . Lower GI bleeding 07/11/2014 hospitalized  . Neuromuscular disorder (HCC)    numbness in legs and feet  . Personal history of radiation therapy 2011    Family History  Problem Relation Age of Onset  . Cancer Other   .  Diabetes Mother   . Hypertension Mother   . Diabetes Brother   . Hypertension Father   . Arthritis/Rheumatoid Father   . Cancer Father   . Cancer Maternal Grandfather   . Heart disease Brother     Past Surgical History:  Procedure Laterality Date  . BREAST BIOPSY Right 2011  . BREAST LUMPECTOMY Right 2011  . COLONOSCOPY    . ESOPHAGOGASTRODUODENOSCOPY N/A 07/12/2014   Procedure: ESOPHAGOGASTRODUODENOSCOPY (EGD);  Surgeon: Clarene Essex, MD;  Location: [facility] ENDOSCOPY;  Service: Endoscopy;  Laterality: N/A;  . FINGER SURGERY     middle finger on left hand  . HYSTERECTOMY ABDOMINAL WITH SALPINGO-OOPHORECTOMY Bilateral 05/11/2020  Procedure: TOTAL ABDOMINAL HYSTERECTOMY WITH BILATERAL SALPINGO-OOPHORECTOMY;  Surgeon: Molli Posey, MD;  [facility];  Service: Gynecology;  Laterality: Bilateral;  NEEDS BED  . MASTECTOMY COMPLETE / SIMPLE W/ SENTINEL NODE BIOPSY  05/2009   Archie Endo 05/23/2009  . TOTAL KNEE ARTHROPLASTY Right 10/23/2016  . TOTAL KNEE ARTHROPLASTY Right 10/23/2016   Procedure: RIGHT TOTAL KNEE ARTHROPLASTY;  Surgeon: [name], MD;  Location: [place];  Service: Orthopedics;  Laterality: Right;   Social History   Occupational History  . Occupation: retired    Comment: housekeeping  Tobacco Use  . Smoking status: Never Smoker  . Smokeless tobacco: Never Used  Vaping Use  . Vaping Use: Never used  Substance and Sexual Activity  . Alcohol use: Yes    Alcohol/week: 14.0 standard drinks    Types: 10 Glasses of wine, 4 Shots of liquor per week    Comment: daily since her 20s several a day   . Drug use: Yes    Frequency: 7.0 times per week    Types: Marijuana  . Sexual activity: Yes    Birth control/protection: Post-menopausal, Condom    Comment: 2 partners in last year without condom use

## 2020-06-07 ENCOUNTER — Other Ambulatory Visit: Payer: Self-pay

## 2020-06-07 ENCOUNTER — Encounter: Payer: Self-pay | Admitting: Orthopaedic Surgery

## 2020-06-07 ENCOUNTER — Ambulatory Visit (INDEPENDENT_AMBULATORY_CARE_PROVIDER_SITE_OTHER): Payer: Medicare HMO | Admitting: Orthopaedic Surgery

## 2020-06-07 VITALS — BP 145/81 | HR 76 | Ht 60.0 in | Wt 157.0 lb

## 2020-06-07 DIAGNOSIS — M79671 Pain in right foot: Secondary | ICD-10-CM | POA: Diagnosis not present

## 2020-06-07 DIAGNOSIS — R6889 Other general symptoms and signs: Secondary | ICD-10-CM | POA: Diagnosis not present

## 2020-06-07 NOTE — Progress Notes (Signed)
Office Visit Note   Patient: Carrie [name]           Date of Birth: Nov 10, 1950           MRN: [id number] Visit Date: 06/07/2020              Requested by: Anderson, Chelsey L, DO [place],  [place] [id number] PCP: Richarda Osmond, DO   Assessment & Plan: Visit Diagnoses:  1. Pain in right foot   2.      Fall with right knee contusion.  3.     Left lateral ankle sprain.   Plan: Patient can gradually work her way out of the [place] in a few weeks as well as working from her left postop shoe into regular tennis shoe.  She has had improvement in the last week.  X-rays obtained 05/31/2020 were reviewed with patient again today.  Follow-Up Instructions: Return in about 6 weeks (around 07/19/2020), or has april 14 th appt already which is fine.   Orders:  No orders of the defined types were placed in this encounter.  No orders of the defined types were placed in this encounter.     Procedures: No procedures performed   Clinical Data: No additional findings.   Subjective: Chief Complaint  Patient presents with  . Right Hip - Pain  . Right Knee - Pain  . Left Foot - Pain    HPI 70 year old female returns with ongoing problems with right hip pain right knee pain and left foot pain.  She states she continues to have symptoms since her fall on 05/26/2020 with pain and swelling in her right knee.  She was going up 4 steps in the front of her house and fell.  She chipped her tooth when she fell forward.  She has been on the left foot postop shoe and right ankle ASO.  Review of Systems all other systems updated and neg , unchanged from 05/16/20 OV.    Objective: Vital Signs: BP (!) 145/81   Pulse 76   Ht 5' (1.524 m)   Wt 157 lb (71.2 kg)   BMI 30.66 kg/m   Physical Exam Constitutional:      Appearance: She is well-developed.  HENT:     Head: Normocephalic.     Right Ear: External ear normal.     Left Ear: External ear normal.  Eyes:     Pupils: Pupils  are equal, round, and reactive to light.  Neck:     Thyroid: No thyromegaly.     Trachea: No tracheal deviation.  Cardiovascular:     Rate and Rhythm: Normal rate.  Pulmonary:     Effort: Pulmonary effort is normal.  Abdominal:     Palpations: Abdomen is soft.  Skin:    General: Skin is warm and dry.  Neurological:     Mental Status: She is alert and oriented to person, place, and time.  Psychiatric:        Behavior: Behavior normal.     Ortho Exam patient has mild swelling right knee anteriorly over the tibial tubercle with intact skin.  Collateral ligaments are stable.  Full extension good flexion.  Negative logroll the hips.  Some tenderness over the right anterior talofibular ligament.  Specialty Comments:  No specialty comments available.  Imaging: No results found.   PMFS History: Patient Active Problem List   Diagnosis Date Noted  . Ovarian cyst 05/11/2020  . Balance problem 11/30/2019  . Right  leg pain 09/22/2019  . Need for immunization against influenza 02/25/2019  . Seasonal allergies 11/18/2018  . Dysuria 10/02/2018  . Lipoma of face 09/06/2018  . Bug bite 09/06/2018  . Numbness and tingling of foot 10/14/2017  . Abdominal pain, right upper quadrant 05/16/2017  . Neuropathy 04/17/2017  . Cramping of feet 03/14/2017  . Mild tetrahydrocannabinol (THC) abuse 11/20/2016  . Pain in right foot 09/01/2016  . Impingement syndrome of left shoulder 09/01/2016  . Bilateral sensorineural hearing loss 07/18/2016  . Subjective tinnitus of both ears 07/18/2016  . Spinal stenosis of lumbar region 02/14/2016  . Eustachian tube dysfunction 09/04/2015  . Back pain of thoracolumbar region 05/08/2015  . Epidermoid cyst of skin 01/10/2015  . Muscle cramping 12/05/2014  . Low back pain 06/14/2014  . Preventative health care 03/15/2014  . Diabetes mellitus screening 02/22/2014  . Bilateral knee pain 02/08/2014  . Transaminitis 07/23/2013  . Essential hypertension, benign  07/23/2013  . GERD (gastroesophageal reflux disease) 07/23/2013  . Vitamin D deficiency 07/22/2013  . Breast cancer, left breast ([place]) 07/17/2011  . Steatosis 07/17/2011  . FIBROIDS, UTERUS 01/30/2007  . Alcohol abuse 01/30/2007   Past Medical History:  Diagnosis Date  . Arthritis    "back, right hand" (07/11/2014)  . Breast cancer [facility]) 2011   right breast  . Breast cancer, right breast ([place]) 05/2009  . Chronic lower back pain   . GERD (gastroesophageal reflux disease)   . Hepatic steatosis 07/17/2011  . Hypertension   . Hypokalemia 07/17/2011  . Lower GI bleeding 07/11/2014 hospitalized  . Neuromuscular disorder (HCC)    numbness in legs and feet  . Personal history of radiation therapy 2011    Family History  Problem Relation Age of Onset  . Cancer Other   . Diabetes Mother   . Hypertension Mother   . Diabetes Brother   . Hypertension Father   . Arthritis/Rheumatoid Father   . Cancer Father   . Cancer Maternal Grandfather   . Heart disease Brother     Past Surgical History:  Procedure Laterality Date  . BREAST BIOPSY Right 2011  . BREAST LUMPECTOMY Right 2011  . COLONOSCOPY    . ESOPHAGOGASTRODUODENOSCOPY N/A 07/12/2014   Procedure: ESOPHAGOGASTRODUODENOSCOPY (EGD);  Surgeon: Clarene Essex, MD;  Location: [facility] ENDOSCOPY;  Service: Endoscopy;  Laterality: N/A;  . FINGER SURGERY     middle finger on left hand  . HYSTERECTOMY ABDOMINAL WITH SALPINGO-OOPHORECTOMY Bilateral 05/11/2020   Procedure: TOTAL ABDOMINAL HYSTERECTOMY WITH BILATERAL SALPINGO-OOPHORECTOMY;  Surgeon: Molli Posey, MD;  [facility];  Service: Gynecology;  Laterality: Bilateral;  NEEDS BED  . MASTECTOMY COMPLETE / SIMPLE W/ SENTINEL NODE BIOPSY  05/2009   Archie Endo 05/23/2009  . TOTAL KNEE ARTHROPLASTY Right 10/23/2016  . TOTAL KNEE ARTHROPLASTY Right 10/23/2016   Procedure: RIGHT TOTAL KNEE ARTHROPLASTY;  Surgeon: [name], MD;  Location: [place];  Service: Orthopedics;  Laterality: Right;   Social  History   Occupational History  . Occupation: retired    Comment: housekeeping  Tobacco Use  . Smoking status: Never Smoker  . Smokeless tobacco: Never Used  Vaping Use  . Vaping Use: Never used  Substance and Sexual Activity  . Alcohol use: Yes    Alcohol/week: 14.0 standard drinks    Types: 10 Glasses of wine, 4 Shots of liquor per week    Comment: daily since her 20s several a day   . Drug use: Yes    Frequency: 7.0 times per week  Types: Marijuana  . Sexual activity: Yes    Birth control/protection: Post-menopausal, Condom    Comment: 2 partners in last year without condom use

## 2020-06-12 DIAGNOSIS — R6889 Other general symptoms and signs: Secondary | ICD-10-CM | POA: Diagnosis not present

## 2020-07-12 ENCOUNTER — Ambulatory Visit (INDEPENDENT_AMBULATORY_CARE_PROVIDER_SITE_OTHER): Payer: Medicare Other | Admitting: Orthopaedic Surgery

## 2020-07-12 ENCOUNTER — Encounter: Payer: Self-pay | Admitting: Orthopaedic Surgery

## 2020-07-12 VITALS — BP 144/82 | HR 75 | Ht 60.0 in | Wt 157.0 lb

## 2020-07-12 DIAGNOSIS — M1712 Unilateral primary osteoarthritis, left knee: Secondary | ICD-10-CM | POA: Diagnosis not present

## 2020-07-12 DIAGNOSIS — M79672 Pain in left foot: Secondary | ICD-10-CM | POA: Diagnosis not present

## 2020-07-12 NOTE — Progress Notes (Addendum)
Office Visit Note   Patient: Carrie [name]           Date of Birth: [birth year]           MRN: [id number] Visit Date: 07/12/2020              Requested by: Anderson, Chelsey L, DO [place],  [place] [address] PCP: Richarda Osmond, DO   Assessment & Plan: Visit Diagnoses: 1.    Foot  pain                            2.    Left knee osteoarthritis Plan: continue conservative Tx and progression in activity . LE strengthening discussed.   Follow-Up Instructions: Return in about 4 months (around 11/11/2020).   Orders:  No orders of the defined types were placed in this encounter.  No orders of the defined types were placed in this encounter.     Procedures: No procedures performed   Clinical Data: No additional findings.   Subjective: Chief Complaint  Patient presents with  . Right Foot - Follow-up  . Right Knee - Follow-up  . Left Ankle - Follow-up    HPI patient returns for follow-up post fall 05/26/2020.  She is out of the [place] is amatory without any bracing.  She has noted progressive improvement.  She has questions about her knee her right foot her left ankle but all 3 are improving.  Patient originally fell when she was going up steps.  No fever chills no rheumatologic conditions.  Review of Systems 14 point update unchanged from 05/16/2020.   Objective: Vital Signs: BP (!) 144/82   Pulse 75   Ht 5' (1.524 m)   Wt 157 lb (71.2 kg)   BMI 30.66 kg/m   Physical Exam Constitutional:      Appearance: She is well-developed.  HENT:     Head: Normocephalic.     Right Ear: External ear normal.     Left Ear: External ear normal.  Eyes:     Pupils: Pupils are equal, round, and reactive to light.  Neck:     Thyroid: No thyromegaly.     Trachea: No tracheal deviation.  Cardiovascular:     Rate and Rhythm: Normal rate.  Pulmonary:     Effort: Pulmonary effort is normal.  Abdominal:     Palpations: Abdomen is soft.  Skin:    General: Skin  is warm and dry.  Neurological:     Mental Status: She is alert and oriented to person, place, and time.  Psychiatric:        Behavior: Behavior normal.     Ortho Exam no tenderness ankle good range of motion of the knee.  Full extension good flexion of her knee.  Negative logroll to the hips right left knee and ankle jerk are intact.  No cellulitis no rash over exposed skin.  Specialty Comments:  No specialty comments available.  Imaging: No results found.   PMFS History: Patient Active Problem List   Diagnosis Date Noted  . Unilateral primary osteoarthritis, left knee 07/12/2020  . Ovarian cyst 05/11/2020  . Balance problem 11/30/2019  . Right leg pain 09/22/2019  . Need for immunization against influenza 02/25/2019  . Seasonal allergies 11/18/2018  . Dysuria 10/02/2018  . Lipoma of face 09/06/2018  . Bug bite 09/06/2018  . Numbness and tingling of foot 10/14/2017  . Abdominal pain, right upper quadrant  05/16/2017  . Neuropathy 04/17/2017  . Cramping of feet 03/14/2017  . Mild tetrahydrocannabinol (THC) abuse 11/20/2016  . Pain in right foot 09/01/2016  . Impingement syndrome of left shoulder 09/01/2016  . Bilateral sensorineural hearing loss 07/18/2016  . Subjective tinnitus of both ears 07/18/2016  . Spinal stenosis of lumbar region 02/14/2016  . Eustachian tube dysfunction 09/04/2015  . Back pain of thoracolumbar region 05/08/2015  . Epidermoid cyst of skin 01/10/2015  . Muscle cramping 12/05/2014  . Low back pain 06/14/2014  . Preventative health care 03/15/2014  . Diabetes mellitus screening 02/22/2014  . Bilateral knee pain 02/08/2014  . Transaminitis 07/23/2013  . Essential hypertension, benign 07/23/2013  . GERD (gastroesophageal reflux disease) 07/23/2013  . Vitamin D deficiency 07/22/2013  . Breast cancer, left breast ([place]) 07/17/2011  . Steatosis 07/17/2011  . FIBROIDS, UTERUS 01/30/2007  . Alcohol abuse 01/30/2007   Past Medical History:   Diagnosis Date  . Arthritis    "back, right hand" (07/11/2014)  . Breast cancer [facility]) 2011   right breast  . Breast cancer, right breast ([place]) 05/2009  . Chronic lower back pain   . GERD (gastroesophageal reflux disease)   . Hepatic steatosis 07/17/2011  . Hypertension   . Hypokalemia 07/17/2011  . Lower GI bleeding 07/11/2014 hospitalized  . Neuromuscular disorder (H[facility])    numbness in legs and feet  . Personal history of radiation therapy 2011    Family History  Problem Relation Age of Onset  . Cancer Other   . Diabetes Mother   . Hypertension Mother   . Diabetes Brother   . Hypertension Father   . Arthritis/Rheumatoid Father   . Cancer Father   . Cancer Maternal Grandfather   . Heart disease Brother     Past Surgical History:  Procedure Laterality Date  . BREAST BIOPSY Right 2011  . BREAST LUMPECTOMY Right 2011  . COLONOSCOPY    . ESOPHAGOGASTRODUODENOSCOPY N/A 07/12/2014   Procedure: ESOPHAGOGASTRODUODENOSCOPY (EGD);  Surgeon: Clarene Essex, MD;  Location[facility] ENDOSCOPY;  Service: Endoscopy;  Laterality: N/A;  . FINGER SURGERY     middle finger on left hand  . HYSTERECTOMY ABDOMINAL WITH SALPINGO-OOPHORECTOMY Bilateral 05/11/2020   Procedure: TOTAL ABDOMINAL HYSTERECTOMY WITH BILATERAL SALPINGO-OOPHORECTOMY;  Surgeon: Molli Posey, MD;  Location: [facility] ORS;  Service: Gynecology;  Laterality: Bilateral;  NEEDS BED  . MASTECTOMY COMPLETE / SIMPLE W/ SENTINEL NODE BIOPSY  05/2009   Archie Endo 05/23/2009  . TOTAL KNEE ARTHROPLASTY Right 10/23/2016  . TOTAL KNEE ARTHROPLASTY Right 10/23/2016   Procedure: RIGHT TOTAL KNEE ARTHROPLASTY;  Surgeon: [name], MD;  Location: [facility];  Service: Orthopedics;  Laterality: Right;   Social History   Occupational History  . Occupation: retired    Comment: housekeeping  Tobacco Use  . Smoking status: Never Smoker  . Smokeless tobacco: Never Used  Vaping Use  . Vaping Use: Never used  Substance and Sexual Activity  . Alcohol use: Yes     Alcohol/week: 14.0 standard drinks    Types: 10 Glasses of wine, 4 Shots of liquor per week    Comment: daily since her 20s several a day   . Drug use: Yes    Frequency: 7.0 times per week    Types: Marijuana  . Sexual activity: Yes    Birth control/protection: Post-menopausal, Condom    Comment: 2 partners in last year without condom use

## 2020-07-20 ENCOUNTER — Other Ambulatory Visit: Payer: Self-pay | Admitting: Obstetrics and Gynecology

## 2020-07-20 DIAGNOSIS — Z1231 Encounter for screening mammogram for malignant neoplasm of breast: Secondary | ICD-10-CM

## 2020-08-03 ENCOUNTER — Telehealth: Payer: Self-pay | Admitting: Student in an Organized Health Care Education/Training Program

## 2020-08-03 ENCOUNTER — Ambulatory Visit: Payer: Medicare Other | Admitting: Family Medicine

## 2020-08-03 NOTE — Telephone Encounter (Signed)
Got a call from [name] at [facility], she states patient called them wanting to cancel her appointment today, then said nevermind I am confused, patient then called then back and she just kept saying I am confused i'm confused. Carrie [name] that was on the phone with with patient managed to get her name and information to contact us, Carrie [name] stated patient sounded extremely confused, and just wanted to call and let us know. I called patient to ask if she was wanting to cancel her appointment and she said no she is coming today, she said I was just confused. Just wanted to inform doctor. Thanks!

## 2020-08-04 ENCOUNTER — Encounter: Payer: Self-pay | Admitting: Student in an Organized Health Care Education/Training Program

## 2020-08-04 NOTE — Telephone Encounter (Signed)
Received staff message from [place] that patient had confusion with appointments on phone calls that seemed very concerning and out of character yesterday.  I called to check in on Carrie [name] today and she was ironing her clothes. She states that she missed her [facility] appointment due to not having transportation.  During our conversation, she did make several comments that made me think she was not remembering her phone calls the day previous or the missed appointment. But she overall seemed unconcerned or ill. I asked her to come in soon for a check in on her memory/confusion and she agreed. My next appointment availability is a month away so scheduled her with one of my colleagues next week.  I asked Carrie [name] if she needed transportation and if she had access to [employer] transportation. She seemed a bit confused during this conversation but endorsed she can use this service and knows how to set it up. She wrote down her appointment information and the clinic address during our phone call and stated that she would call the service to set up a ride for next week after our phone call.

## 2020-08-08 ENCOUNTER — Ambulatory Visit (INDEPENDENT_AMBULATORY_CARE_PROVIDER_SITE_OTHER): Payer: Medicare Other | Admitting: Family Medicine

## 2020-08-08 ENCOUNTER — Other Ambulatory Visit: Payer: Self-pay

## 2020-08-08 ENCOUNTER — Encounter: Payer: Self-pay | Admitting: Family Medicine

## 2020-08-08 VITALS — BP 136/84 | HR 79 | Ht 60.0 in | Wt 143.0 lb

## 2020-08-08 DIAGNOSIS — R300 Dysuria: Secondary | ICD-10-CM | POA: Diagnosis not present

## 2020-08-08 LAB — POCT URINALYSIS DIP (MANUAL ENTRY)
Glucose, UA: NEGATIVE mg/dL
Nitrite, UA: POSITIVE — AB
Protein Ur, POC: 100 mg/dL — AB
Spec Grav, UA: >=1.03 — AB (ref 1.010–1.025)
Urobilinogen, UA: 0.2 E.U./dL
pH, UA: 5.5 (ref 5.0–8.0)

## 2020-08-08 MED ORDER — CEPHALEXIN 500 MG PO CAPS: 500.0000 mg | ORAL_CAPSULE | Freq: Four times a day (QID) | ORAL | 0 refills | Status: AC

## 2020-08-08 NOTE — Assessment & Plan Note (Addendum)
Moderate LE, positive nitrite, urinary frequency, urgency and burning. Denies pelvic pain, vaginal pain, vaginal discharge or odor. Most likely UTI.  -Will treat with Keflex 500mg  4 times daily x 5 days -Carrie [name] was instructed to take the full course of the medication even if she started to feel better -Patient asked to follow up in 1 week if no better

## 2020-08-08 NOTE — Progress Notes (Signed)
    SUBJECTIVE:   CHIEF COMPLAINT / HPI:   Dysuria: Patient presents to clinic reporting burning with urination for the last week.  She denies any lower abdominal pain over her bladder, denies itch and odor; however, she endorses urinary frequency and urgency.  No concern for body aches, fevers, chills.  She is sexually active with 1 partner.  Urinalysis performed at today's office visit shows trace ketones, positive nitrites, moderate 2+ leuk esterase.  Confusion: On 08/04/2020 patient's PCP received a staff message that the patient had had some confusion with appointments on phone calls that seemed very out of character for her.  As the patient's PCP is scheduled out for another month she asked her to come in and be seen by another one of her colleagues.  Today the patient adamantly denies being confused.  Today she denies any concerns with her mentation, thinking, or memory.   PERTINENT  PMH / PSH:  Dysequilibrium, Alcohol Use Disorder, HTN,   OBJECTIVE:   BP 136/84   Pulse 79   Ht 5' (1.524 m)   Wt 143 lb (64.9 kg)   SpO2 99%   BMI 27.93 kg/m    Physical exam: General: Nontoxic-appearing, no apparent distress Respiratory: Comfortable work of breathing on room air Neuro: Normal gait, no focal deficits appreciated on exam.  ASSESSMENT/PLAN:   Dysuria Moderate LE, positive nitrite, urinary frequency, urgency and burning. Denies pelvic pain, vaginal pain, vaginal discharge or odor. Most likely UTI.  -Will treat with Keflex 500mg  4 times daily x 5 days -Ms. Emmerich was instructed to take the full course of the medication even if she started to feel better -Patient asked to follow up in 1 week if no better   Daisy Floro, [place]

## 2020-08-08 NOTE — Patient Instructions (Signed)
Thank you for coming in to see [place] today! Please see below to review our plan for today's visit:  1.  You are having a urinary tract infection.  This is when bacteria can enter your bladder and cause discomfort such as urinary frequency, urinary urgency, bladder pain, and pain/discomfort with urination. 2.  You have been prescribed an antibiotic called Keflex/cephalexin 500 mg tablet.  Please take this tablet 4 times daily for the next 5 days.  Take the entire course of this antibiotic until you have taken all of the tablets even if you start to feel better. 3.  Please follow-up with [place] in 1 week if you are not feeling any better.  Please call the clinic at [phone] if your symptoms worsen or you have any concerns. It was our pleasure to serve you!   Dr. Milus Banister [facility]

## 2020-08-17 ENCOUNTER — Other Ambulatory Visit: Payer: Self-pay | Admitting: Family Medicine

## 2020-08-17 DIAGNOSIS — R300 Dysuria: Secondary | ICD-10-CM

## 2020-08-21 ENCOUNTER — Other Ambulatory Visit: Payer: Self-pay

## 2020-08-21 ENCOUNTER — Other Ambulatory Visit: Payer: Medicare Other

## 2020-08-21 ENCOUNTER — Encounter: Payer: Self-pay | Admitting: Family Medicine

## 2020-08-21 ENCOUNTER — Telehealth (INDEPENDENT_AMBULATORY_CARE_PROVIDER_SITE_OTHER): Payer: Medicare Other | Admitting: Family Medicine

## 2020-08-21 DIAGNOSIS — R300 Dysuria: Secondary | ICD-10-CM | POA: Diagnosis not present

## 2020-08-21 MED ORDER — NITROFURANTOIN MONOHYD MACRO 100 MG PO CAPS: 100.0000 mg | ORAL_CAPSULE | Freq: Two times a day (BID) | ORAL | 0 refills | Status: AC

## 2020-08-21 NOTE — Progress Notes (Signed)
[facility] Visit  Patient consented to have virtual visit and was identified by name and date of birth. Method of visit: Telephone  Encounter participants: Patient: Carrie [name] - located at home Provider: Martyn Malay - located at home Others (if applicable): none  Chief Complaint:  Burning with urination   HPI:  Carrie [name] is a very pleasant 70 year old woman with history significant for hypertension and breast cancer (2011) presenting today via telephone visit for complaints of ongoing burning with urination.  She was recently seen for similar complaint earlier this month.  She reports her primary symptom is urinary frequency, urgency and dysuria.  She specifically denies vaginal discharge, flank pain, fevers, nausea, vomiting.  She reports a poor appetite over the past year due to a change in her dentition and appetite.  She completed a course of cephalexin taking 3 capsules for 3 days which resulted in resolution of her symptoms earlier this month.  Approximately 4 days after completing therapy her symptoms returned.  She is not sexually active in this timeframe.  She reports good hygiene.  She denies a history of recurrent urinary tract infections.  Patient struggles with transportation does not have a car for herself. She has medicaid transport arranged.   ROS: per HPI  Pertinent PMHx:  L ovarian cyst Last urinary tract infectin 08/06/2020- treated with cephalexin  HTN   Exam:  Calm, speaking in full sentences, coherent  Assessment/Plan:  Dysuria, likely due to UTI Other etiologies considered include yeast vaginitis, atrophic vaginitis, less likely due to prolapse symptoms  Given recent Rx for antibiotics will obtain culture Although not optimal in those age over 35, will prescribe nitrofurantoin given severity of symptoms (just had cephalexin, Bactrim less optimal due to side effects and resistance) Discussed differential with patient and  need for pelvic exam--scheduled with Dr. Ouida Sills Given persistent hematuria on dipsticks without culture- recommend repeat dipstick at follow up to ensure resolution of hematuria (as she will be ideally free of UTI symptoms at visit)    Scheduled with PCP for weight check, discuss appetite   Time spent during visit with patient: 8 minutes

## 2020-08-24 ENCOUNTER — Telehealth: Payer: Self-pay | Admitting: Family Medicine

## 2020-08-24 LAB — URINE CULTURE

## 2020-08-24 NOTE — Telephone Encounter (Signed)
Called with results. Urine culture grew E. Coli, sensitive to macrobid.   This is sensitive to cephalexin as well.   All questions answered.   Dorris Singh, MD  Family Medicine Teaching Service

## 2020-09-06 ENCOUNTER — Ambulatory Visit: Payer: Medicare Other | Admitting: Student in an Organized Health Care Education/Training Program

## 2020-09-07 ENCOUNTER — Ambulatory Visit
Admission: RE | Admit: 2020-09-07 | Discharge: 2020-09-07 | Disposition: A | Discharge status: Home / Self Care | Payer: Medicare Other | Source: Ambulatory Visit | Attending: Obstetrics and Gynecology | Admitting: Obstetrics and Gynecology

## 2020-09-07 ENCOUNTER — Other Ambulatory Visit: Payer: Self-pay

## 2020-09-07 DIAGNOSIS — Z1231 Encounter for screening mammogram for malignant neoplasm of breast: Secondary | ICD-10-CM

## 2020-09-11 ENCOUNTER — Ambulatory Visit: Payer: Medicare Other | Admitting: Student in an Organized Health Care Education/Training Program

## 2020-09-18 ENCOUNTER — Ambulatory Visit (INDEPENDENT_AMBULATORY_CARE_PROVIDER_SITE_OTHER): Payer: Medicare Other | Admitting: Student in an Organized Health Care Education/Training Program

## 2020-09-18 ENCOUNTER — Other Ambulatory Visit: Payer: Self-pay

## 2020-09-18 ENCOUNTER — Encounter: Payer: Self-pay | Admitting: Student in an Organized Health Care Education/Training Program

## 2020-09-18 VITALS — BP 122/70 | HR 74 | Ht 60.0 in | Wt 142.0 lb

## 2020-09-18 DIAGNOSIS — R634 Abnormal weight loss: Secondary | ICD-10-CM | POA: Diagnosis not present

## 2020-09-18 DIAGNOSIS — R300 Dysuria: Secondary | ICD-10-CM

## 2020-09-18 DIAGNOSIS — F1020 Alcohol dependence, uncomplicated: Secondary | ICD-10-CM

## 2020-09-18 LAB — POCT URINALYSIS DIP (MANUAL ENTRY)
Bilirubin, UA: NEGATIVE
Blood, UA: NEGATIVE
Glucose, UA: NEGATIVE mg/dL
Ketones, POC UA: NEGATIVE mg/dL
Nitrite, UA: POSITIVE — AB
Protein Ur, POC: NEGATIVE mg/dL
Spec Grav, UA: 1.02 (ref 1.010–1.025)
Urobilinogen, UA: 0.2 E.U./dL
pH, UA: 5.5 (ref 5.0–8.0)

## 2020-09-18 LAB — POCT UA - MICROSCOPIC ONLY

## 2020-09-18 NOTE — Assessment & Plan Note (Signed)
Symptoms have resolved.  Repeat urinalysis negative for blood but still positive leuks and nitrites.  Discussed with patient and decided to not treat due to being asymptomatic at this time.  Recommended pelvic exam and discussed possibility of atrophic vaginitis contributing to symptoms but patient declined at this time and contributes her infrequent burning to a soap she has discontinued.  Return if symptomatic for pelvic exam, urinalysis, and consider treatment of atrophic vaginitis

## 2020-09-18 NOTE — Progress Notes (Deleted)
Refir No tobacco Whiskey a lot.

## 2020-09-18 NOTE — Patient Instructions (Signed)
It was a pleasure to see you today!  To summarize our discussion for this visit: We checked your urine today  Some additional health maintenance measures we should update are: Health Maintenance Due  Topic Date Due   Zoster Vaccines- Shingrix (1 of 2) Never done   PNA vac Low Risk Adult (1 of 2 - PCV13) 09/25/2017   COVID-19 Vaccine (4 - Booster for Pfizer series) 05/18/2020     Please return to our clinic to see me as needed.  Call the clinic at [phone] if your symptoms worsen or you have any concerns.   Thank you for allowing me to take part in your care,  Dr. Doristine Mango

## 2020-09-18 NOTE — Assessment & Plan Note (Signed)
Unintentional. Discussed alcohol use as large contributor.  Denies odynophagia, or dysphagia.  No abdominal discomfort or GI symptoms associated with eating.  Upon chart review, patient's weight fluctuates between 140-160 over past several years so she is still within her normal range and has BMI of 27.73 today so this is reassuring. She is up to date on recommended cancer screenings. Continue to monitor weight at follow ups

## 2020-09-18 NOTE — Progress Notes (Signed)
    SUBJECTIVE:   CHIEF COMPLAINT / HPI: f/u dysuria  Dysuria- symptoms resolved mostly after completing nitrofurantoin about 3 weeks ago. She will still have intermittent burning on urination but this is rare. She states that it is due to using dial soap so since she switched to ivory soap, she has no more issues. She denies blood in urine, foul odor, urgency. She does urinate frequently but unchanged from baseline and does drink a lot.   AUD- patient states that she drinks "a lot" and will not further quantify. Her drink of choice is whiskey. No desire to cut back because she enjoys it. She will drink all day long. Only stops drinking if she runs out of money to purchase. When she goes several hours without alcohol, she will get shaky hands but has never had a seizure. She states that if she has a drink, the shaking resolves.  She does not use tobacco products but smokes a lot of "reefer"  Weight loss- patient is concerned about her weight loss. States that she is often full from what she drinks so has little appetite but will have small portions frequently and has no problems when eating.  OBJECTIVE:   BP 122/70   Pulse 74   Ht 5' (1.524 m)   Wt 142 lb (64.4 kg)   SpO2 97%   BMI 27.73 kg/m   Physical Exam Vitals and nursing note reviewed.  Constitutional:      General: She is not in acute distress.    Appearance: She is not ill-appearing or toxic-appearing.  Pulmonary:     Effort: Pulmonary effort is normal.  Neurological:     Mental Status: She is alert.     Gait: Gait is intact.  Psychiatric:        Mood and Affect: Mood normal.   ASSESSMENT/PLAN:   Dysuria Symptoms have resolved.  Repeat urinalysis negative for blood but still positive leuks and nitrites.  Discussed with patient and decided to not treat due to being asymptomatic at this time.  Recommended pelvic exam and discussed possibility of atrophic vaginitis contributing to symptoms but patient declined at this  time and contributes her infrequent burning to a soap she has discontinued.  Return if symptomatic for pelvic exam, urinalysis, and consider treatment of atrophic vaginitis  Alcohol use disorder, severe, dependence ([place]) Patient expressed unquantifiable amount of severe alcohol dependence with signs of withdrawal in the past.  Has no desire to cut back as she enjoys whiskey so much. Discussed with patient how this could be contributing to weight loss due to the volume of alcohol she is consuming and malabsorption associated with alcohol use.  Weight loss Unintentional. Discussed alcohol use as large contributor.  Denies odynophagia, or dysphagia.  No abdominal discomfort or GI symptoms associated with eating.  Upon chart review, patient's weight fluctuates between 140-160 over past several years so she is still within her normal range and has BMI of 27.73 today so this is reassuring. She is up to date on recommended cancer screenings. Continue to monitor weight at follow ups     [place]

## 2020-09-18 NOTE — Assessment & Plan Note (Signed)
Patient expressed unquantifiable amount of severe alcohol dependence with signs of withdrawal in the past.  Has no desire to cut back as she enjoys whiskey so much. Discussed with patient how this could be contributing to weight loss due to the volume of alcohol she is consuming and malabsorption associated with alcohol use.

## 2020-09-19 ENCOUNTER — Telehealth: Payer: Self-pay

## 2020-09-19 NOTE — Telephone Encounter (Signed)
Patient returns call to nurse line. Patient states that provider LVM for her to return call to office to discuss results.   Please advise.   Talbot Grumbling, RN

## 2020-09-21 NOTE — Telephone Encounter (Signed)
Patient returns call to nurse line requesting to discuss results from [place] on 6/20 with provider.   Talbot Grumbling, RN

## 2020-10-12 ENCOUNTER — Ambulatory Visit: Payer: Medicare Other | Admitting: Student

## 2020-10-20 ENCOUNTER — Ambulatory Visit: Payer: Medicare Other | Admitting: Student

## 2020-10-30 ENCOUNTER — Telehealth: Payer: Self-pay

## 2020-10-30 NOTE — Telephone Encounter (Signed)
Call made to patient and patient has agree to schedule AWV.   8.8.22 at 1pm.

## 2020-11-06 ENCOUNTER — Ambulatory Visit (INDEPENDENT_AMBULATORY_CARE_PROVIDER_SITE_OTHER): Payer: Medicare Other

## 2020-11-06 DIAGNOSIS — Z0000 Encounter for general adult medical examination without abnormal findings: Secondary | ICD-10-CM | POA: Diagnosis not present

## 2020-11-06 NOTE — Progress Notes (Signed)
Subjective:   Carrie [name] is a 70 y.o. female who presents for Medicare Annual (Subsequent) preventive examination. I connected with  LOGYNN PESHEK on 11/06/20 by a telephone only telemedicine application and verified that I am speaking with the correct person using two identifiers.   I discussed the limitations of evaluation and management by telemedicine. The patient expressed understanding and agreed to proceed.  Patient is: Home   Review of Systems    Defered to PCP for completion Cardiac Risk Factors include: advanced age (>37mn, >>27women);hypertension;sedentary lifestyle;family history of premature cardiovascular disease     Objective:    Today's Vitals   11/06/20 1257  PainSc: 0-No pain   There is no height or weight on file to calculate BMI.  Advanced Directives 11/06/2020 09/18/2020 08/08/2020 05/02/2020 11/25/2019 11/16/2018 10/27/2018  Does Patient Have a Medical Advance Directive? No No No No No No No  Type of Advance Directive - - - - - - -  Does patient want to make changes to medical advance directive? - - - - - - -  Would patient like information on creating a medical advance directive? No - Patient declined No - Patient declined No - Patient declined Yes (MAU/Ambulatory/Procedural Areas - Information given) No - Patient declined No - Patient declined No - Patient declined    Current Medications (verified) Outpatient Encounter Medications as of 11/06/2020  Medication Sig   amLODipine (NORVASC) 10 MG tablet Take 1 tablet (10 mg total) by mouth daily.   gabapentin (NEURONTIN) 100 MG capsule Take 2 capsules (200 mg total) by mouth 3 (three) times daily. (Patient taking differently: Take 300 mg by mouth daily.)   ibuprofen (ADVIL) 200 MG tablet Take 4 tablets (800 mg total) by mouth every 8 (eight) hours as needed for moderate pain.   pantoprazole (PROTONIX) 20 MG tablet Take 1 tablet (20 mg total) by mouth daily.   [DISCONTINUED] fluticasone (FLONASE) 50 MCG/ACT nasal  spray Place 1 spray into both nostrils daily as needed for allergies or rhinitis.   [DISCONTINUED] oxyCODONE-acetaminophen (PERCOCET) 5-325 MG tablet Percocet 5 mg-325 mg tablet  Take 1 tablet every 6 hours by oral route as needed.   No facility-administered encounter medications on file as of 11/06/2020.    Allergies (verified) Aspirin   History: Past Medical History:  Diagnosis Date   Arthritis    "back, right hand" (07/11/2014)   [place]   [place]   [place]   Chronic lower back pain    GERD (gastroesophageal reflux disease)    Hepatic steatosis 07/17/2011   Hypertension    Hypokalemia 07/17/2011   Lower GI bleeding 07/11/2014 hospitalized   Neuromuscular disorder (HCC)    numbness in legs and feet   Personal history of radiation therapy 2011   Past Surgical History:  Procedure Laterality Date   BREAST BIOPSY Right 2011   BREAST LUMPECTOMY Right 2011   COLONOSCOPY     ESOPHAGOGASTRODUODENOSCOPY N/A 07/12/2014   Procedure: ESOPHAGOGASTRODUODENOSCOPY (EGD);  Surgeon: MClarene Essex MD;  [facility];  Service: Endoscopy;  Laterality: N/A;   FINGER SURGERY     middle finger on left hand   HYSTERECTOMY ABDOMINAL WITH SALPINGO-OOPHORECTOMY Bilateral 05/11/2020   Procedure: TOTAL ABDOMINAL HYSTERECTOMY WITH BILATERAL SALPINGO-OOPHORECTOMY;  Surgeon: HMolli Posey MD;  [facility];  Service: Gynecology;  Laterality: Bilateral;  NEEDS BED   MASTECTOMY COMPLETE / SIMPLE W/ SENTINEL NODE BIOPSY  05/2009   /Archie Endo2/22/2011  TOTAL KNEE ARTHROPLASTY Right 10/23/2016   TOTAL KNEE ARTHROPLASTY Right 10/23/2016   Procedure: RIGHT TOTAL KNEE ARTHROPLASTY;  Surgeon: [name], MD;  Location: [place];  Service: Orthopedics;  Laterality: Right;   Family History  Problem Relation Age of Onset   Cancer Other    Diabetes Mother    Hypertension Mother    Diabetes Brother    Hypertension Father    Arthritis/Rheumatoid Father     Cancer Father    Cancer Maternal Grandfather    Heart disease Brother    Social History   Socioeconomic History   Marital status: Single    Spouse name: Not on file   Number of children: 0   Years of education: 11   Highest education level: 11th grade  Occupational History   Occupation: retired    Comment: housekeeping  Tobacco Use   Smoking status: Never   Smokeless tobacco: Never  Vaping Use   Vaping Use: Never used  Substance and Sexual Activity   Alcohol use: Yes    Alcohol/week: 14.0 standard drinks    Types: 10 Glasses of wine, 4 Shots of liquor per week    Comment: daily since her 47s several a day    Drug use: Yes    Frequency: 7.0 times per week    Types: Marijuana   Sexual activity: Yes    Birth control/protection: Post-menopausal, Condom    Comment: 2 partners in last year without condom use  Other Topics Concern   Not on file  Social History Narrative   Current Social History 12/31/2016        Who lives at home: Patient lives alone in one level home; has stairs at front door; has hand rail; no throw rugs; has grab bars in bathroom   Transportation: Patient relies on friend to drive her    Important Relationships: Orvil Feil (friend) and Evelina Bucy (sister)    Does not like to cook, hard to cook for one person, has a small appetite, does not like microwave meals   Pets: None    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 / Fun: no hobbies, watches some TV   Current Stressors: None Religious / Personal Beliefs:  Does not attend church                     Social Determinants of Radio broadcast assistant Strain: Low Risk    Difficulty of Paying Living Expenses: Not hard at all  Food Insecurity: No Food Insecurity   Worried About Charity fundraiser in the Last Year: Never true   Arboriculturist in the Last Year: Never true  Transportation Needs: No Transportation Needs   Lack of Transportation (Medical): No   Lack of Transportation  (Non-Medical): No  Physical Activity: Inactive   Days of Exercise per Week: 0 days   Minutes of Exercise per Session: 0 min  Stress: No Stress Concern Present   Feeling of Stress : Not at all  Social Connections: Moderately Isolated   Frequency of Communication with Friends and Family: More than three times a week   Frequency of Social Gatherings with Friends and Family: Twice a week   Attends Religious Services: Never   Marine scientist or Organizations: No   Attends Archivist Meetings: Never   Marital Status: Living with partner    Tobacco Counseling Counseling given: Not Answered  Clinical Intake:  Pre-visit preparation completed: Yes  Pain : No/denies pain Pain Score: 0-No pain     Nutritional Risks: Unintentional weight loss (Clothes are fitting loser than they were.) Diabetes: No  How often do you need to have someone help you when you read instructions, pamphlets, or other written materials from your doctor or pharmacy?: 3 - Sometimes What is the last grade level you completed in school?: 11th grade   Interpreter Needed?: No  Information entered by :: [employer], [place]   Activities of Daily Living In your present state of health, do you have any difficulty performing the following activities: 11/06/2020 05/11/2020  Hearing? N N  Vision? N N  Difficulty concentrating or making decisions? N N  Walking or climbing stairs? N N  Dressing or bathing? N N  Doing errands, shopping? N N  Preparing Food and eating ? N -  Using the Toilet? N -  In the past six months, have you accidently leaked urine? Y -  Do you have problems with loss of bowel control? N -  Managing your Medications? N -  Managing your Finances? N -  Housekeeping or managing your Housekeeping? N -  Some recent data might be hidden    Patient Care Team: Precious Gilding, DO as PCP - General (Family Medicine) [name], MD as Consulting Physician (Orthopedic  Surgery)  Indicate any recent Medical Services you may have received from other than [facility] providers in the past year (date may be approximate).     Assessment:   This is a routine wellness examination for Carrie [name].  Hearing/Vision screen No results found.  Dietary issues and exercise activities discussed: Current Exercise Habits: The patient does not participate in regular exercise at present, Exercise limited by: None identified   Goals Addressed   None   Depression Screen PHQ 2/9 Scores 11/06/2020 09/18/2020 08/08/2020 11/25/2019 09/22/2019 02/23/2019 11/16/2018  PHQ - 2 Score 0 0 2 0 0 0 0  PHQ- 9 Score '3 7 8 6 '$ - - -    Fall Risk Fall Risk  11/06/2020 09/18/2020 08/08/2020 11/25/2019 09/22/2019  Falls in the past year? '1 1 1 '$ 0 1  Number falls in past yr: '1 1 1 '$ 0 0  Injury with Fall? 0 0 1 0 1  Comment - - chipped tooth - -  Risk Factor Category  - - - - -  Comment - - - - -  Risk for fall due to : Impaired balance/gait;History of fall(s) History of fall(s) - - -  Risk for fall due to: Comment Patient states that she falls after drinking - - - -  Follow up Falls evaluation completed Falls evaluation completed;Education provided - - -    FALL RISK PREVENTION PERTAINING TO THE HOME:  Any stairs in or around the home? Yes  If so, are there any without handrails? No  Home free of loose throw rugs in walkways, pet beds, electrical cords, etc? No Patient has 1 throw rug in living room.  Adequate lighting in your home to reduce risk of falls? Yes   ASSISTIVE DEVICES UTILIZED TO PREVENT FALLS:  Life alert? No  Use of a cane, walker or w/c? Yes  Grab bars in the bathroom? Yes  Shower chair or bench in shower? No  Elevated toilet seat or a handicapped toilet? No   TIMED UP AND GO:  Was the test performed?  Not completed .   Cognitive Function: MMSE - Mini Mental State Exam  11/06/2020 03/16/2018  Not completed: Unable to complete -  Orientation to time - 5  Orientation to Place - 5   Registration - 3  Attention/ Calculation - 5  Recall - 3  Language- name 2 objects - 2  Language- repeat - 1  Language- follow 3 step command - 3  Language- read & follow direction - 1  Write a sentence - 1  Copy design - 1  Total score - 30     6CIT Screen 11/06/2020 03/16/2018  What Year? 0 points 0 points  What month? 0 points 0 points  What time? 3 points 0 points  Count back from 20 4 points 0 points  Months in reverse 2 points 0 points  Repeat phrase 4 points 0 points  Total Score 13 0    Immunizations Immunization History  Administered Date(s) Administered   Fluad Quad(high Dose 65+) 02/23/2019   Influenza,inj,Quad PF,6+ Mos 03/15/2014, 02/09/2015, 12/31/2016, 03/16/2018   PFIZER(Purple Top)SARS-COV-2 Vaccination 06/10/2019, 07/05/2019, 02/16/2020   Pneumococcal Polysaccharide-23 07/13/2014   Td 02/05/1999   Tdap 03/15/2014    TDAP status: Up to date  Flu Vaccine status: Declined, Education has been provided regarding the importance of this vaccine but patient still declined. Advised may receive this vaccine at local pharmacy or Health Dept. Aware to provide a copy of the vaccination record if obtained from local pharmacy or Health Dept. Verbalized acceptance and understanding.  Pneumococcal vaccine status: Declined,  Education has been provided regarding the importance of this vaccine but patient still declined. Advised may receive this vaccine at local pharmacy or Health Dept. Aware to provide a copy of the vaccination record if obtained from local pharmacy or Health Dept. Verbalized acceptance and understanding.   Covid-19 vaccine status: Completed vaccines  Qualifies for Shingles Vaccine? Yes   Zostavax completed No   Shingrix Completed?: No.    Education has been provided regarding the importance of this vaccine. Patient has been advised to call insurance company to determine out of pocket expense if they have not yet received this vaccine. Advised may also  receive vaccine at local pharmacy or Health Dept. Verbalized acceptance and understanding.  Screening Tests Health Maintenance  Topic Date Due   Zoster Vaccines- Shingrix (1 of 2) Never done   PNA vac Low Risk Adult (1 of 2 - PCV13) 09/25/2017   COVID-19 Vaccine (4 - Booster for Pfizer series) 05/18/2020   INFLUENZA VACCINE  10/30/2020   COLONOSCOPY (Pts 45-24yr Insurance coverage will need to be confirmed)  08/28/2022   MAMMOGRAM  09/08/2022   TETANUS/TDAP  03/15/2024   DEXA SCAN  Completed   Hepatitis C Screening  Completed   HPV VACCINES  Aged Out    Health Maintenance  Health Maintenance Due  Topic Date Due   Zoster Vaccines- Shingrix (1 of 2) Never done   PNA vac Low Risk Adult (1 of 2 - PCV13) 09/25/2017   COVID-19 Vaccine (4 - Booster for Pfizer series) 05/18/2020   INFLUENZA VACCINE  10/30/2020    Colorectal cancer screening: Type of screening: Colonoscopy. Completed 08/27/2012. Repeat every 10 years  Mammogram status: Completed 09/07/2020. Repeat every year  Bone Density status: Completed 07/06/2012. Results reflect: Bone density results: OSTEOPOROSIS. Repeat every 2 years.  Lung Cancer Screening: (Low Dose CT Chest recommended if Age 68-80years, 30 pack-year currently smoking OR have quit w/in 15years.) does not qualify.   Lung Cancer Screening Referral:   Additional Screening:  Hepatitis C Screening: does qualify; Completed 07/23/2013  Vision  Screening: Recommended annual ophthalmology exams for early detection of glaucoma and other disorders of the eye. Is the patient up to date with their annual eye exam?  No  Who is the provider or what is the name of the office in which the patient attends annual eye exams? Patient denies If pt is not established with a provider, would they like to be referred to a provider to establish care? No .   Dental Screening: Recommended annual dental exams for proper oral hygiene  Community Resource Referral / Chronic Care  Management: CRR required this visit?  No   CCM required this visit?  No      Plan:     I have personally reviewed and noted the following in the patient's chart:   Medical and social history Use of alcohol, tobacco or illicit drugs  Current medications and supplements including opioid prescriptions.  Functional ability and status Nutritional status Physical activity Advanced directives List of other physicians Hospitalizations, surgeries, and ER visits in previous 12 months Vitals Screenings to include cognitive, depression, and falls Referrals and appointments  In addition, I have reviewed and discussed with patient certain preventive protocols, quality metrics, and best practice recommendations. A written personalized care plan for preventive services as well as general preventive health recommendations were provided to patient.     Tawni Pummel, CMA   11/06/2020   Nurse Notes: none

## 2020-11-06 NOTE — Patient Instructions (Signed)
Thank you for enrolling in [facility]. Please follow the instructions below to securely access your online medical record. MyChart allows you to send messages to your doctor, view your test results, manage appointments, and more.   How Do I Sign Up? In your Internet browser, go to [employer] and enter [email or web address]. Click on the Sign Up Now link in the Sign In box. You will see the New Member Sign Up page. Enter your MyChart Access Code exactly as it appears below. You will not need to use this code after you've completed the sign-up process. If you do not sign up before the expiration date, you must request a new code.  MyChart Access Code: [email or web address] Expires: 12/21/2020 12:50 PM  Enter your Social Security Number ([id number]) and Date of Birth (mm/dd/yyyy) as indicated and click Submit. You will be taken to the next sign-up page. Create a MyChart ID. This will be your MyChart login ID and cannot be changed, so think of one that is secure and easy to remember. Create a Scientist, research (physical sciences). You can change your password at any time. Enter your Password Reset Question and Answer. This can be used at a later time if you forget your password.  Enter your e-mail address. You will receive e-mail notification when new information is available in [facility]. Click Sign Up. You can now view your medical record.   Additional Information Remember, MyChart is NOT to be used for urgent needs. For medical emergencies, dial 911.

## 2020-11-09 ENCOUNTER — Ambulatory Visit: Payer: Medicare Other | Admitting: Student

## 2020-11-24 ENCOUNTER — Ambulatory Visit: Payer: Medicare Other | Admitting: Family Medicine

## 2020-12-13 ENCOUNTER — Ambulatory Visit: Payer: Medicare Other | Admitting: Orthopaedic Surgery

## 2020-12-15 ENCOUNTER — Ambulatory Visit: Payer: Medicare Other

## 2021-02-27 ENCOUNTER — Encounter (HOSPITAL_COMMUNITY): Payer: Self-pay | Admitting: Emergency Medicine

## 2021-02-27 ENCOUNTER — Other Ambulatory Visit: Payer: Self-pay

## 2021-02-27 ENCOUNTER — Emergency Department (HOSPITAL_COMMUNITY)
Admission: EM | Admit: 2021-02-27 | Discharge: 2021-02-28 | Disposition: A | Discharge status: Home / Self Care | Payer: Medicare Other | Attending: Emergency Medicine | Admitting: Emergency Medicine

## 2021-02-27 DIAGNOSIS — R059 Cough, unspecified: Secondary | ICD-10-CM | POA: Diagnosis not present

## 2021-02-27 DIAGNOSIS — R0981 Nasal congestion: Secondary | ICD-10-CM | POA: Diagnosis not present

## 2021-02-27 DIAGNOSIS — J3489 Other specified disorders of nose and nasal sinuses: Secondary | ICD-10-CM | POA: Insufficient documentation

## 2021-02-27 DIAGNOSIS — Z5321 Procedure and treatment not carried out due to patient leaving prior to being seen by health care provider: Secondary | ICD-10-CM | POA: Insufficient documentation

## 2021-02-27 MED ORDER — LORATADINE 10 MG PO TABS: 10.0000 mg | ORAL_TABLET | Freq: Once | ORAL | Status: DC

## 2021-02-27 MED ORDER — LORAZEPAM 1 MG PO TABS: 0.0000 mg | ORAL_TABLET | Freq: Two times a day (BID) | ORAL | Status: DC

## 2021-02-27 MED ORDER — THIAMINE HCL 100 MG PO TABS: 100.0000 mg | ORAL_TABLET | Freq: Every day | ORAL | Status: DC

## 2021-02-27 MED ORDER — LORAZEPAM 1 MG PO TABS: 0.0000 mg | ORAL_TABLET | Freq: Four times a day (QID) | ORAL | Status: DC

## 2021-02-27 NOTE — ED Triage Notes (Signed)
Pt reports she is tired of runny nose and her eyes watering.  She also has a cough but no other symptoms  When asked how much she drank, she stated "as much as I can".  "I am an alcoholic."

## 2021-02-27 NOTE — ED Provider Notes (Signed)
Emergency Medicine Provider Triage Evaluation Note  Carrie [name] , a 70 y.o. female  was evaluated in triage.  Pt complains of runny nose and watery eyes.  She states that her symptoms have been persistent over the past 2 months.  She tried using some nasal spray from her primary care doctor which she got a few years ago, but it has not been helping her.  She openly admits to being an alcoholic and drinks "as much as I can" daily denies any fevers, chest pain, shortness of breath at present. No sick contacts.  Review of Systems  Positive: As above Negative: As above  Physical Exam  There were no vitals taken for this visit. Gen:   Awake, no distress   Resp:  Normal effort  MSK:   Moves extremities without difficulty  Other:  Lungs CTAB. Ambulatory without assistance, but unsteady gait. Clinically intoxicated.  Medical Decision Making  Medically screening exam initiated at 11:50 PM.  Appropriate orders placed.  Carrie [name] was informed that the remainder of the evaluation will be completed by another provider, this initial triage assessment does not replace that evaluation, and the importance of remaining in the ED until their evaluation is complete.  Symptoms of suspected rhinitis. Intoxicated. CIWA ordered.   Antonietta Breach, PA-C 02/27/21 2353    Quintella Reichert, MD 02/28/21 Shelah Lewandowsky

## 2021-02-28 NOTE — ED Notes (Signed)
Pt decided to leave while waiting for a room.  

## 2021-03-05 ENCOUNTER — Ambulatory Visit: Payer: Medicare Other | Admitting: Family Medicine

## 2021-03-05 NOTE — Progress Notes (Deleted)
er

## 2021-03-12 ENCOUNTER — Ambulatory Visit: Payer: Medicare Other | Admitting: Family Medicine

## 2021-03-15 ENCOUNTER — Ambulatory Visit: Payer: Medicare Other | Admitting: Student

## 2021-04-23 ENCOUNTER — Other Ambulatory Visit: Payer: Self-pay | Admitting: Student in an Organized Health Care Education/Training Program

## 2021-04-23 DIAGNOSIS — I10 Essential (primary) hypertension: Secondary | ICD-10-CM

## 2021-05-09 ENCOUNTER — Ambulatory Visit: Payer: Medicare Other | Admitting: Orthopaedic Surgery

## 2021-05-16 NOTE — Patient Instructions (Addendum)
It was great to see you! Thank you for allowing me to participate in your care!  Our plans for today:  - Check Liver with CMP - Naltrexone to help with cravings - GI referral for issues swallowing - Continue amlodipine for your blood pressures  We are checking some labs today, I will call you if they are abnormal will send you a MyChart message or a letter if they are normal.  If you do not hear about your labs in the next 2 weeks please let us know.  Take care and seek immediate care sooner if you develop any concerns.   Dr. Holley Bouche, MD [place]

## 2021-05-16 NOTE — Progress Notes (Signed)
SUBJECTIVE:   CHIEF COMPLAINT / HPI:   BP check and med refill Called pharm for refill, wasn't able to get them. Will refill med.  BP: 135/77   Last BP was Patient taking: amlodipine 10 BP Readings from Last 3 Encounters:  05/18/21 135/77  05/17/21 136/77  02/28/21 123/70   Problems eating,  Can't swallow the food. No real appettite, loss weight since 2018 where she was 214 llbs. Admits to alchol abuse that takes her appetitie away. May throw up 1-2 times in a week. May throw up if she does'nt have ETOH. Heavy coughing causes her to throw up. Remembers throwing up 3-4 times last week. If she hasn't had etoh in a while she may throw up. Mostly throwing up liquid. Shakes if haven't drank in a day.  -food wont go down and hurts when she swallows. Only foods, won't go down, she can do liquids.   ETOH: Has been drinking since 21-22 years old. Drinking ever since and is drinking more now. Drinking about a pint or more a day. Going on for years. When she doesn't drink for a couple of days, she gets the shakes. No stress or anxiety as the cause. Doesn't drive while drinking, got a ride here today. Has slowed down on alcohol intake. Wanting to check liver for dysfunction.    PERTINENT  PMH / PSH: HTN, GERD, alcohol use dissorder  Past Medical History:  Diagnosis Date   Arthritis    "back, right hand" (07/11/2014)   Breast cancer ([place]) 2011   right breast   Breast cancer, right breast ([place]) 05/2009   Chronic lower back pain    GERD (gastroesophageal reflux disease)    Hepatic steatosis 07/17/2011   Hypertension    Hypokalemia 07/17/2011   Lower GI bleeding 07/11/2014 hospitalized   Neuromuscular disorder ([place])    numbness in legs and feet   Personal history of radiation therapy 2011    Past Surgical History:  Procedure Laterality Date   BREAST BIOPSY Right 2011   BREAST LUMPECTOMY Right 2011   COLONOSCOPY     ESOPHAGOGASTRODUODENOSCOPY N/A 07/12/2014   Procedure:  ESOPHAGOGASTRODUODENOSCOPY (EGD);  Surgeon: Clarene Essex, MD;  [facility];  Service: Endoscopy;  Laterality: N/A;   FINGER SURGERY     middle finger on left hand   HYSTERECTOMY ABDOMINAL WITH SALPINGO-OOPHORECTOMY Bilateral 05/11/2020   Procedure: TOTAL ABDOMINAL HYSTERECTOMY WITH BILATERAL SALPINGO-OOPHORECTOMY;  Surgeon: Molli Posey, MD;  Location: [facility];  Service: Gynecology;  Laterality: Bilateral;  NEEDS BED   MASTECTOMY COMPLETE / SIMPLE W/ SENTINEL NODE BIOPSY  05/2009   Archie Endo 05/23/2009   TOTAL KNEE ARTHROPLASTY Right 10/23/2016   TOTAL KNEE ARTHROPLASTY Right 10/23/2016   Procedure: RIGHT TOTAL KNEE ARTHROPLASTY;  Surgeon: [name], MD;  Location: [place];  Service: Orthopedics;  Laterality: Right;    OBJECTIVE:  BP 135/77    Pulse 83    Ht 5' (1.524 m)    Wt 137 lb 12.8 oz (62.5 kg)    SpO2 100%    BMI 26.91 kg/m   General: NAD, pleasant, able to participate in exam Cardiac: RRR, no murmurs auscultated. Respiratory: CTAB, normal effort, no wheezes, rales or rhonchi Abdomen: soft, non-tender, non-distended, normoactive bowel sounds Extremities: warm and well perfused, no edema or cyanosis. Skin: warm and dry, no rashes noted Neuro: alert, no obvious focal deficits, speech normal Psych: Normal affect and mood, patient scattered with slow speech, suspect ETOH use  ASSESSMENT/PLAN:  Essential hypertension, benign BP ok today.  Will refill medication. Encouraged patient to continue taking BP medicine. -Continue Amlodipine 10 mg  Alcohol use disorder, severe, dependence ([place]) Extensive history of ETOH use. Patient not wanting to quit but is wanting something to help with cravings. Will try naltrexone for cravings. Patient also concerned that ETOH may be having effect on liver and wanting it checked. Will check liver enzymes.  -CMP for liver enzymes and electrolytes -Naltrexone 50 mg daily   GERD (gastroesophageal reflux disease) Patient with history of  difficulty swallowing food due to pain. Can swallow liquids. Patient also has extensive ETOH history and reports vomiting multiple times a week. Concern for esophagitis 2/2 emesis and ETOH, will refer to GI. -Referral to GI   Orders Placed This Encounter  Procedures   Comprehensive metabolic panel   Ambulatory referral to Gastroenterology    Referral Priority:   Routine    Referral Type:   Consultation    Referral Reason:   Specialty Services Required    Number of Visits Requested:   1   Meds ordered this encounter  Medications   amLODipine (NORVASC) 10 MG tablet    Sig: Take 1 tablet (10 mg total) by mouth daily.    Dispense:  30 tablet    Refill:  0    This prescription was filled on 03/19/2019. Any refills authorized will be placed on file.   naltrexone (DEPADE) 50 MG tablet    Sig: Take 1 tablet (50 mg total) by mouth daily.    Dispense:  30 tablet    Refill:  0   No follow-ups on file. @SIGNNOTE @

## 2021-05-17 ENCOUNTER — Other Ambulatory Visit: Payer: Self-pay

## 2021-05-17 ENCOUNTER — Ambulatory Visit (INDEPENDENT_AMBULATORY_CARE_PROVIDER_SITE_OTHER): Payer: Medicare Other | Admitting: Surgery

## 2021-05-17 ENCOUNTER — Ambulatory Visit: Payer: Self-pay

## 2021-05-17 ENCOUNTER — Encounter: Payer: Self-pay | Admitting: Surgery

## 2021-05-17 VITALS — BP 136/77 | HR 66 | Ht 60.0 in | Wt 133.4 lb

## 2021-05-17 DIAGNOSIS — M545 Low back pain, unspecified: Secondary | ICD-10-CM

## 2021-05-17 DIAGNOSIS — M4316 Spondylolisthesis, lumbar region: Secondary | ICD-10-CM | POA: Diagnosis not present

## 2021-05-17 DIAGNOSIS — G9519 Other vascular myelopathies: Secondary | ICD-10-CM

## 2021-05-17 NOTE — Progress Notes (Signed)
Office Visit Note   Patient: Carrie [name]           Date of Birth: 23-Oct-1950           MRN: [id number] Visit Date: 05/17/2021              Requested by: Precious Gilding, DO [address] [place],  [place] [address] PCP: Precious Gilding, DO   Assessment & Plan: Visit Diagnoses:  1. Acute bilateral low back pain, unspecified whether sciatica present   2. Spondylolisthesis, lumbar region   3. Neurogenic claudication ([place])     Plan: Since patient continues have ongoing issues with her lumbar spine I recommend getting lumbar MRI to compare to study that was done in 2021.  Follow-up with Dr. Lorin Mercy after completion in 3 weeks to discuss results and further treatment options.  Follow-Up Instructions: Return in about 3 weeks (around 06/07/2021) for With Dr. Lorin Mercy to review lumbar MRI.   Orders:  Orders Placed This Encounter  Procedures   XR Lumbar Spine 2-3 Views   No orders of the defined types were placed in this encounter.     Procedures: No procedures performed   Clinical Data: No additional findings.   Subjective: Chief Complaint  Patient presents with   Lower Back - Pain    HPI 71 year old black female with known history of lumbar spondylosis and grade 1 L5-S1 spondylolisthesis returns.  She has seen Dr. Lorin Mercy for this in the past.  Previous lumbar MRI July 09, 2019 showed:  Narrative & Impression  CLINICAL DATA:  71 year old female with low back pain radiating to both legs and feet, progressive for several months with no known injury.   EXAM: MRI LUMBAR SPINE WITHOUT CONTRAST   TECHNIQUE: Multiplanar, multisequence MR imaging of the lumbar spine was performed. No intravenous contrast was administered.   COMPARISON:  Lumbar MRI 02/29/2016. Lumbar CT myelogram 06/25/2017.   FINDINGS: Segmentation: Transitional anatomy as detailed on the prior CT myelogram. The same numbering system will be used as on the comparison status in ating the lowest open disc space  L5-S1. Correlation with radiographs is recommended prior to any operative intervention.   Alignment: Grade 1 anterolisthesis of L5 on S1 measures 5-6 mm and is stable since 2017. Mild dextroconvex lumbar scoliosis. Preserved lordosis otherwise.   Vertebrae: Chronic L1 inferior endplate deformity. Chronic degenerative endplate marrow signal changes are pronounced also at L3-L4. No marrow edema or evidence of acute osseous abnormality. Background bone marrow signal is within normal limits. Intact visible sacrum and SI joints.   Conus medullaris and cauda equina: Conus extends to the L1 level. No lower spinal cord or conus signal abnormality.   Paraspinal and other soft tissues: The urinary bladder is distended today and on the prior studies (series 3, image 18). Visualized abdominal viscera and paraspinal soft tissues are within normal limits.   Disc levels:   Widespread mild lower thoracic spinal stenosis related to disc bulging and posterior element hypertrophy (series 2, image 8), stable since 2017. Mild if any lower thoracic spinal cord mass effect and no cord signal abnormality.   T12-L1:  Mild to moderate facet degeneration without stenosis.   L1-L2: Chronic circumferential disc bulge and posterior element hypertrophy with mild bilateral L1 foraminal stenosis. This level stable.   L2-L3: Far lateral disc bulging and mild posterior element hypertrophy. Chronic epidural lipomatosis which is largely responsible for mild to moderate spinal stenosis here which seems increased from the prior studies (series 5, image 14).  Mild L2 foraminal stenosis is stable.   L3-L4: Advanced chronic disc and endplate degeneration with circumferential disc osteophyte complex, moderate to severe posterior element hypertrophy greater on the right. Chronic epidural lipomatosis. Moderate to severe spinal stenosis appears increased (series 5, image 19). Moderate to severe right greater than left  L3 foraminal stenosis is stable.   L4-L5: Chronic circumferential disc bulge and moderate to severe facet hypertrophy which is greater on the left. However, evidence of developing left side facet ankylosis now (series 5, image 23). Mild right L4 foraminal stenosis is stable.   L5-S1: Chronic anterolisthesis with circumferential disc bulge and severe facet hypertrophy. Chronic facet joint fluid, although decreased on the left. Questionable developing left facet joint ankylosis now. Moderate to severe left and moderate right L5 foraminal stenosis is stable.   IMPRESSION: 1. Transitional anatomy. The same numbering system will be used as on the comparison status status post at the lowest open disc space L5-S1. Correlation with radiographs is recommended prior to any operative intervention.   2. Chronically advanced lumbar spine degeneration. No acute osseous abnormality identified, but evidence of developing ankylosis at chronically degenerated left side L4-L5 and L5-S1 facets.   3. Progressed multifactorial spinal stenosis at L2-L3 and L3-L4 since 2019, with significant contribution from epidural lipomatosis.   4. Stable lumbar stenosis and neural impingement elsewhere.   5. Chronically distended urinary bladder.  Query urinary retention.     Electronically Signed   By: Genevie Ann M.D.   On: 07/10/2019 19:40   She states that Dr. Lorin Mercy has talked to her about possibly needing surgery previously.  She continues to have ongoing chronic low back pain with neurogenic claudication symptoms.  Pain radiating down both legs along with numbness and tingling in both feet.  Walking distances have decreased over the last year or so.  Back pain also aggravated with bending, twisting.   Review of Systems No current cardiac pulmonary GI GU issues  Objective: Vital Signs: BP 136/77    Pulse 66    Ht 5' (1.524 m)    Wt 133 lb 6.4 oz (60.5 kg)    BMI 26.05 kg/m   Physical Exam HENT:      Head: Normocephalic.     Nose: Nose normal.  Eyes:     Extraocular Movements: Extraocular movements intact.  Pulmonary:     Breath sounds: Normal breath sounds.  Abdominal:     General: There is no distension.  Musculoskeletal:     Comments: Bilateral lumbar paraspinal tenderness/spasm.  Bilateral sciatic notch tenderness.  Negative log roll bilateral hips.  Negative straight leg raise.  No focal motor deficits.  Neurological:     Mental Status: She is alert and oriented to person, place, and time.  Psychiatric:        Mood and Affect: Mood normal.    Ortho Exam  Specialty Comments:  No specialty comments available.  Imaging: No results found.   PMFS History: Patient Active Problem List   Diagnosis Date Noted   Weight loss 09/18/2020   Unilateral primary osteoarthritis, left knee 07/12/2020   Ovarian cyst 05/11/2020   Balance problem 11/30/2019   Lipoma of face 09/06/2018   Neuropathy 04/17/2017   Bilateral sensorineural hearing loss 07/18/2016   Essential hypertension, benign 07/23/2013   GERD (gastroesophageal reflux disease) 07/23/2013   Breast cancer, left breast ([place]) 07/17/2011   FIBROIDS, UTERUS 01/30/2007   Alcohol use disorder, severe, dependence ([place]) 01/30/2007   Past Medical History:  Diagnosis Date  Arthritis    "back, right hand" (07/11/2014)   Breast cancer ([place]) 2011   right breast   Breast cancer, right breast ([place]) 05/2009   Chronic lower back pain    GERD (gastroesophageal reflux disease)    Hepatic steatosis 07/17/2011   Hypertension    Hypokalemia 07/17/2011   Lower GI bleeding 07/11/2014 hospitalized   Neuromuscular disorder (HCC)    numbness in legs and feet   Personal history of radiation therapy 2011    Family History  Problem Relation Age of Onset   Cancer Other    Diabetes Mother    Hypertension Mother    Diabetes Brother    Hypertension Father    Arthritis/Rheumatoid Father    Cancer Father    Cancer Maternal Grandfather     Heart disease Brother     Past Surgical History:  Procedure Laterality Date   BREAST BIOPSY Right 2011   BREAST LUMPECTOMY Right 2011   COLONOSCOPY     ESOPHAGOGASTRODUODENOSCOPY N/A 07/12/2014   Procedure: ESOPHAGOGASTRODUODENOSCOPY (EGD);  Surgeon: Clarene Essex, MD;  Location: [facility] ENDOSCOPY;  Service: Endoscopy;  Laterality: N/A;   FINGER SURGERY     middle finger on left hand   HYSTERECTOMY ABDOMINAL WITH SALPINGO-OOPHORECTOMY Bilateral 05/11/2020   Procedure: TOTAL ABDOMINAL HYSTERECTOMY WITH BILATERAL SALPINGO-OOPHORECTOMY;  Surgeon: Molli Posey, MD;  Location: [facility] ORS;  Service: Gynecology;  Laterality: Bilateral;  NEEDS BED   MASTECTOMY COMPLETE / SIMPLE W/ SENTINEL NODE BIOPSY  05/2009   Archie Endo 05/23/2009   TOTAL KNEE ARTHROPLASTY Right 10/23/2016   TOTAL KNEE ARTHROPLASTY Right 10/23/2016   Procedure: RIGHT TOTAL KNEE ARTHROPLASTY;  Surgeon: [name], MD;  Location: [facility];  Service: Orthopedics;  Laterality: Right;   Social History   Occupational History   Occupation: retired    Comment: housekeeping  Tobacco Use   Smoking status: Never   Smokeless tobacco: Never  Vaping Use   Vaping Use: Never used  Substance and Sexual Activity   Alcohol use: Yes    Alcohol/week: 14.0 standard drinks    Types: 10 Glasses of wine, 4 Shots of liquor per week    Comment: daily since her 63s several a day    Drug use: Yes    Frequency: 7.0 times per week    Types: Marijuana   Sexual activity: Yes    Birth control/protection: Post-menopausal, Condom    Comment: 2 partners in last year without condom use

## 2021-05-18 ENCOUNTER — Ambulatory Visit (INDEPENDENT_AMBULATORY_CARE_PROVIDER_SITE_OTHER): Payer: Medicare Other | Admitting: Student

## 2021-05-18 ENCOUNTER — Encounter: Payer: Self-pay | Admitting: Student

## 2021-05-18 VITALS — BP 135/77 | HR 83 | Ht 60.0 in | Wt 137.8 lb

## 2021-05-18 DIAGNOSIS — K219 Gastro-esophageal reflux disease without esophagitis: Secondary | ICD-10-CM

## 2021-05-18 DIAGNOSIS — F1020 Alcohol dependence, uncomplicated: Secondary | ICD-10-CM | POA: Diagnosis not present

## 2021-05-18 DIAGNOSIS — I10 Essential (primary) hypertension: Secondary | ICD-10-CM

## 2021-05-18 MED ORDER — NALTREXONE HCL 50 MG PO TABS: 50.0000 mg | ORAL_TABLET | Freq: Every day | ORAL | 0 refills | Status: AC

## 2021-05-18 MED ORDER — AMLODIPINE BESYLATE 10 MG PO TABS: 10.0000 mg | ORAL_TABLET | Freq: Every day | ORAL | 0 refills | Status: DC

## 2021-05-18 NOTE — Assessment & Plan Note (Signed)
BP ok today. Will refill medication. Encouraged patient to continue taking BP medicine. -Continue Amlodipine 10 mg

## 2021-05-18 NOTE — Assessment & Plan Note (Signed)
Patient with history of difficulty swallowing food due to pain. Can swallow liquids. Patient also has extensive ETOH history and reports vomiting multiple times a week. Concern for esophagitis 2/2 emesis and ETOH, will refer to GI. -Referral to GI

## 2021-05-18 NOTE — Assessment & Plan Note (Signed)
Extensive history of ETOH use. Patient not wanting to quit but is wanting something to help with cravings. Will try naltrexone for cravings. Patient also concerned that ETOH may be having effect on liver and wanting it checked. Will check liver enzymes.  -CMP for liver enzymes and electrolytes -Naltrexone 50 mg daily

## 2021-05-19 LAB — COMPREHENSIVE METABOLIC PANEL
ALT: 21 IU/L (ref 0–32)
AST: 53 IU/L — ABNORMAL HIGH (ref 0–40)
Albumin/Globulin Ratio: 1.4 (ref 1.2–2.2)
Albumin: 4.8 g/dL (ref 3.8–4.8)
Alkaline Phosphatase: 54 IU/L (ref 44–121)
BUN/Creatinine Ratio: 19 (ref 12–28)
BUN: 20 mg/dL (ref 8–27)
Bilirubin Total: <0.2 mg/dL (ref 0.0–1.2)
CO2: 21 mmol/L (ref 20–29)
Calcium: 10 mg/dL (ref 8.7–10.3)
Chloride: 101 mmol/L (ref 96–106)
Creatinine, Ser: 1.06 mg/dL — ABNORMAL HIGH (ref 0.57–1.00)
Globulin, Total: 3.4 g/dL (ref 1.5–4.5)
Glucose: 96 mg/dL (ref 70–99)
Potassium: 4.8 mmol/L (ref 3.5–5.2)
Sodium: 139 mmol/L (ref 134–144)
Total Protein: 8.2 g/dL (ref 6.0–8.5)
eGFR: 57 mL/min/{1.73_m2} — ABNORMAL LOW (ref >59)

## 2021-05-25 ENCOUNTER — Encounter: Payer: Self-pay | Admitting: Family Medicine

## 2021-05-25 NOTE — Progress Notes (Signed)
Patient has no-showed to multiple appointments in a 6-month period. Per our no-show policy, a letter has been routed to [facility] Admin to be mailed to patient regarding likely dismissal for repeat no show. Will CC to PCP.  ° °

## 2021-06-12 ENCOUNTER — Ambulatory Visit: Payer: Medicare Other | Admitting: Orthopaedic Surgery

## 2021-06-26 ENCOUNTER — Other Ambulatory Visit: Payer: Self-pay

## 2021-06-26 ENCOUNTER — Encounter: Payer: Self-pay | Admitting: Orthopaedic Surgery

## 2021-06-26 ENCOUNTER — Ambulatory Visit (INDEPENDENT_AMBULATORY_CARE_PROVIDER_SITE_OTHER): Payer: Medicare Other | Admitting: Orthopaedic Surgery

## 2021-06-26 VITALS — BP 99/64 | HR 81 | Ht 60.0 in | Wt 133.0 lb

## 2021-06-26 DIAGNOSIS — M48062 Spinal stenosis, lumbar region with neurogenic claudication: Secondary | ICD-10-CM | POA: Diagnosis not present

## 2021-07-01 NOTE — Progress Notes (Signed)
? ?Office Visit Note ?  ?Patient: Carrie [name]           ?Date of Birth: 1950/05/13           ?MRN: [id number] ?Visit Date: 06/26/2021 ?             ?Requested by: Precious Gilding, DO ?[address] ?[place],  [place] [address] ?PCP: Precious Gilding, DO ? ? ?Assessment & Plan: ?Visit Diagnoses:  ?1. Spinal stenosis of lumbar region with neurogenic claudication   ? ? ?Plan: Patient's MRI scan for some reason was not scheduled.  We will proceed with scheduling a lumbar MRI scan I will follow-up after exam for review. ? ?Follow-Up Instructions: Return if symptoms worsen or fail to improve.  ? ?Orders:  ?No orders of the defined types were placed in this encounter. ? ?No orders of the defined types were placed in this encounter. ? ? ? ? Procedures: ?No procedures performed ? ? ?Clinical Data: ?No additional findings. ? ? ?Subjective: ?Chief Complaint  ?Patient presents with  ? Lower Back - Pain  ? ? ?HPI 71 year old female with low back pain pain that radiates down to her left foot.  She has numbness and tingling in both feet. ?Previous MRI lumbar 07/10/2019 showed degenerative changes at L4-5 and L5-S1 facets.  Progressive multifactorial stenosis at L2-3 and L3-4 progressed from 2019.  Patient does have previous diagnosed breast cancer 2016.  Alcohol use disorder severe dependence.  Positive for neuropathy. ?Review of Systems ? ? ?Objective: ?Vital Signs: BP 99/64   Pulse 81   Ht 5' (1.524 m)   Wt 133 lb (60.3 kg)   BMI 25.97 kg/m?  ? ?Physical Exam ?Constitutional:   ?   Appearance: She is well-developed.  ?HENT:  ?   Head: Normocephalic.  ?   Right Ear: External ear normal.  ?   Left Ear: External ear normal. There is no impacted cerumen.  ?Eyes:  ?   Pupils: Pupils are equal, round, and reactive to light.  ?Neck:  ?   Thyroid: No thyromegaly.  ?   Trachea: No tracheal deviation.  ?Cardiovascular:  ?   Rate and Rhythm: Normal rate.  ?Pulmonary:  ?   Effort: Pulmonary effort is normal.  ?Abdominal:  ?   Palpations: Abdomen is  soft.  ?Musculoskeletal:  ?   Cervical back: No rigidity.  ?Skin: ?   General: Skin is warm and dry.  ?Neurological:  ?   Mental Status: She is alert and oriented to person, place, and time.  ?Psychiatric:     ?   Behavior: Behavior normal.  ? ? ?Ortho Exam patient has pain with sciatic notch palpation right and left decreased stocking sensation both feet.  Some tenderness lumbosacral junction.  Quads are strong.  Negative logroll the hips. ? ?Specialty Comments:  ?No specialty comments available. ? ?Imaging: ?No results found. ? ? ?PMFS History: ?Patient Active Problem List  ? Diagnosis Date Noted  ? Weight loss 09/18/2020  ? Unilateral primary osteoarthritis, left knee 07/12/2020  ? Ovarian cyst 05/11/2020  ? Balance problem 11/30/2019  ? Lipoma of face 09/06/2018  ? Neuropathy 04/17/2017  ? Bilateral sensorineural hearing loss 07/18/2016  ? Spinal stenosis of lumbar region 02/14/2016  ? Essential hypertension, benign 07/23/2013  ? GERD (gastroesophageal reflux disease) 07/23/2013  ? Breast cancer, left breast ([place]) 07/17/2011  ? FIBROIDS, UTERUS 01/30/2007  ? Alcohol use disorder, severe, dependence ([place]) 01/30/2007  ? ?Past Medical History:  ?Diagnosis Date  ?  Arthritis   ? "back, right hand" (07/11/2014)  ? Breast cancer [facility]) 2011  ? right breast  ? Breast cancer, right breast ([place]) 05/2009  ? Chronic lower back pain   ? GERD (gastroesophageal reflux disease)   ? Hepatic steatosis 07/17/2011  ? Hypertension   ? Hypokalemia 07/17/2011  ? Lower GI bleeding 07/11/2014 hospitalized  ? Neuromuscular disorder (HCC)   ? numbness in legs and feet  ? Personal history of radiation therapy 2011  ?  ?Family History  ?Problem Relation Age of Onset  ? Cancer Other   ? Diabetes Mother   ? Hypertension Mother   ? Diabetes Brother   ? Hypertension Father   ? Arthritis/Rheumatoid Father   ? Cancer Father   ? Cancer Maternal Grandfather   ? Heart disease Brother   ?  ?Past Surgical History:  ?Procedure Laterality Date  ? BREAST  BIOPSY Right 2011  ? BREAST LUMPECTOMY Right 2011  ? COLONOSCOPY    ? ESOPHAGOGASTRODUODENOSCOPY N/A 07/12/2014  ? Procedure: ESOPHAGOGASTRODUODENOSCOPY (EGD);  Surgeon: Clarene Essex, MD;  Location[facility] END[facility];  Service: Endoscopy;  Laterality: N/A;  ? FINGER SURGERY    ? middle finger on left hand  ? HYSTERECTOMY ABDOMINAL WITH SALPINGO-OOPHORECTOMY Bilateral 05/11/2020  ? Procedure: TOTAL ABDOMINAL HYSTERECTOMY WITH BILATERAL SALPINGO-OOPHORECTOMY;  Surgeon: Molli Posey, MD;  Location: [facility];  Service: Gynecology;  Laterality: Bilateral;  NEEDS BED  ? MASTECTOMY COMPLETE / SIMPLE W/ SENTINEL NODE BIOPSY  05/2009  ? Archie Endo 05/23/2009  ? TOTAL KNEE ARTHROPLASTY Right 10/23/2016  ? TOTAL KNEE ARTHROPLASTY Right 10/23/2016  ? Procedure: RIGHT TOTAL KNEE ARTHROPLASTY;  Surgeon: [name], MD;  Location: [facility];  Service: Orthopedics;  Laterality: Right;  ? ?Social History  ? ?Occupational History  ? Occupation: retired  ?  Comment: housekeeping  ?Tobacco Use  ? Smoking status: Never  ? Smokeless tobacco: Never  ?Vaping Use  ? Vaping Use: Never used  ?Substance and Sexual Activity  ? Alcohol use: Yes  ?  Alcohol/week: 14.0 standard drinks  ?  Types: 10 Glasses of wine, 4 Shots of liquor per week  ?  Comment: daily since her 91s several a day   ? Drug use: Yes  ?  Frequency: 7.0 times per week  ?  Types: Marijuana  ? Sexual activity: Yes  ?  Birth control/protection: Post-menopausal, Condom  ?  Comment: 2 partners in last year without condom use  ? ? ? ? ? ? ?

## 2021-07-03 ENCOUNTER — Other Ambulatory Visit: Payer: Self-pay

## 2021-07-03 ENCOUNTER — Ambulatory Visit (INDEPENDENT_AMBULATORY_CARE_PROVIDER_SITE_OTHER): Payer: Medicare Other | Admitting: Student

## 2021-07-03 ENCOUNTER — Encounter: Payer: Self-pay | Admitting: Student

## 2021-07-03 VITALS — BP 131/90 | HR 86 | Wt 136.4 lb

## 2021-07-03 DIAGNOSIS — G629 Polyneuropathy, unspecified: Secondary | ICD-10-CM | POA: Diagnosis not present

## 2021-07-03 DIAGNOSIS — R1310 Dysphagia, unspecified: Secondary | ICD-10-CM

## 2021-07-03 DIAGNOSIS — F1020 Alcohol dependence, uncomplicated: Secondary | ICD-10-CM

## 2021-07-03 NOTE — Assessment & Plan Note (Signed)
-  B12 level ordered ?

## 2021-07-03 NOTE — Patient Instructions (Signed)
It was great to see you! Thank you for allowing me to participate in your care! ? ?I recommend that you always bring your medications to each appointment as this makes it easy to ensure you are on the correct medications and helps [place] not miss when refills are needed. ? ?Our plans for today:  ?- Here is the number to [facility]: [phone]. You have an appointment with then on May 23rd at 9:30 am with Dr. Michail Sermon. I recommend you call them to see if you can get an earlier appointment ?-In the meantime, I recommend you buy a liquid nutrition drink such as Ensure so you have a way of getting calories and nutrients.  ? ?We are checking some labs today, I will call you if they are abnormal will send you a MyChart message or a letter if they are normal.  If you do not hear about your labs in the next 2 weeks please let us know. ? ?Take care and seek immediate care sooner if you develop any concerns.  ? ?Dr. Precious Gilding, DO ?[facility] ? ?

## 2021-07-03 NOTE — Assessment & Plan Note (Signed)
Called GI - They spoke with pt on 05/22/2021 apt scheduled for May 23rd at 9:30 with Dr. Michail Sermon.  I am very concerned about patient as she is not able to eat solids currently.  Could be a dysmotility issue versus esophageal cancer. ?-Advised patient to call and see if she can get a sooner appointment ?-Encouraged patient to get liquid nutrition supplement such as Ensure and drink multiple times a day as swallowing solids is difficult ?

## 2021-07-03 NOTE — Progress Notes (Signed)
? ? ?  SUBJECTIVE:  ? ?CHIEF COMPLAINT / HPI:  ? ?Alcohol use disorder ?Pt started on Naltrexone 50 mg daily at last visit on 05/18/21 but patient has not started. ?Pt states she had been trying to cut back on hard liquor and is drinking 1 lg box wine w/in 2-3 days.  Not at all interested in AA and not interested in naltrexone currently.  Patient states alcohol is such a big part of her life and has been since she was a child.  States she enjoys drinking and is not ready to quit at this time.  When asked what it would take for patient to stop drinking, she is unable to answer the question but nods her head when asked to contemplate it. ? ?Dysphagia ?Was referred to GI with concern for esophagitis 2/2 to emesis and alcohol use. No one has called to schedule appointment but states she will not pick up the phone if she doesn't know the number. ?Not eating much d/t decreased appetite and unable to swallow.   ? ?Neuropathy ?Patient was seen recently by Dr. Lorin Mercy due to spinal stenosis which is likely the cause of her neuropathy. Patient states that her visit with Dr. Lorin Mercy recently he advised her to see her PCP regarding something to do with vitamin B12 although I do not see anything in his note from 06/26/2021.  B12 was last checked 11/25/2019 and was 286.  Advised patient we can recheck today as it was on the lower end and patient has had poor p.o. intake. ? ? ? ?PERTINENT  PMH / PSH: HTN, GERD, OA, spinal stenosis, alcohol use disorder ? ?OBJECTIVE:  ? ?Vitals:  ? 07/03/21 0825  ?BP: 131/90  ?Pulse: 86  ?SpO2: 100%  ? ? ? ?General: NAD, pleasant, able to participate in exam ?Cardiac: RRR, no murmurs. ?Respiratory: CTAB, normal effort, No wheezes, rales or rhonchi ?Abdomen: Bowel sounds present, mild tenderness to palpation of RUQ, nondistended, soft ?Extremities: no edema or cyanosis. ?Skin: warm and dry ?Neuro: alert, no obvious focal deficits ?Psych: Normal affect, mildly anxious appearing, fidgety  ? ?ASSESSMENT/PLAN:   ?Alcohol use disorder ?I did advise patient that if she continues to drink it is possible she can end up in the hospital later in life due to severe liver disease.  Advised patient that my recommendation is for her to quit drinking before that happens.  Patient understood but is not ready to stop at this time. ?-Continue to have conversations about alcohol cessation ? ?Dysphagia ?Called GI - They spoke with pt on 05/22/2021 apt scheduled for May 23rd at 9:30 with Dr. Michail Sermon.  I am very concerned about patient as she is not able to eat solids currently.  Could be a dysmotility issue versus esophageal cancer. ?-Advised patient to call and see if she can get a sooner appointment ?-Encouraged patient to get liquid nutrition supplement such as Ensure and drink multiple times a day as swallowing solids is difficult ? ?Neuropathy ?-B12 level ordered ? ? ?Dr. Precious Gilding, DO ?[facility]  ? ? ? ? ?

## 2021-07-03 NOTE — Assessment & Plan Note (Signed)
I did advise patient that if she continues to drink it is possible she can end up in the hospital later in life due to severe liver disease.  Advised patient that my recommendation is for her to quit drinking before that happens.  Patient understood but is not ready to stop at this time. ?-Continue to have conversations about alcohol cessation ?

## 2021-07-04 ENCOUNTER — Encounter: Payer: Self-pay | Admitting: Student

## 2021-07-04 LAB — VITAMIN B12: Vitamin B-12: 278 pg/mL (ref 232–1245)

## 2021-07-04 NOTE — Progress Notes (Signed)
Letter sent with normal B12 results  ?

## 2021-07-31 ENCOUNTER — Other Ambulatory Visit: Payer: Self-pay | Admitting: Obstetrics and Gynecology

## 2021-07-31 DIAGNOSIS — Z1231 Encounter for screening mammogram for malignant neoplasm of breast: Secondary | ICD-10-CM

## 2021-08-08 ENCOUNTER — Other Ambulatory Visit: Payer: Self-pay

## 2021-08-08 DIAGNOSIS — I10 Essential (primary) hypertension: Secondary | ICD-10-CM

## 2021-08-09 MED ORDER — AMLODIPINE BESYLATE 10 MG PO TABS: 10.0000 mg | ORAL_TABLET | Freq: Every day | ORAL | 0 refills | Status: DC

## 2021-08-09 NOTE — Telephone Encounter (Signed)
Called patient to inform her of RX that was filled (Amlodipine) and (Protonix) that was not. ? ?Patient states that she will make an appointment after she checks her calendar.   ? ?Patient states that she only takes Protonix as needed and not everyday which is why it has not been filled on a regular basis. ? ?.Ozella Almond, CMA ? ?

## 2021-08-13 DIAGNOSIS — R8279 Other abnormal findings on microbiological examination of urine: Secondary | ICD-10-CM | POA: Diagnosis not present

## 2021-08-13 DIAGNOSIS — R3914 Feeling of incomplete bladder emptying: Secondary | ICD-10-CM | POA: Diagnosis not present

## 2021-08-13 DIAGNOSIS — R350 Frequency of micturition: Secondary | ICD-10-CM | POA: Diagnosis not present

## 2021-08-22 ENCOUNTER — Ambulatory Visit: Payer: Medicare Other | Admitting: Student

## 2021-09-05 ENCOUNTER — Ambulatory Visit: Payer: Medicare Other | Admitting: Student

## 2021-09-10 ENCOUNTER — Ambulatory Visit: Payer: Medicare Other

## 2021-09-19 ENCOUNTER — Encounter: Payer: Self-pay | Admitting: Student

## 2021-09-19 ENCOUNTER — Ambulatory Visit (INDEPENDENT_AMBULATORY_CARE_PROVIDER_SITE_OTHER): Payer: Medicare Other | Admitting: Student

## 2021-09-19 VITALS — BP 126/76 | HR 74 | Ht 60.0 in | Wt 139.2 lb

## 2021-09-19 DIAGNOSIS — F1020 Alcohol dependence, uncomplicated: Secondary | ICD-10-CM | POA: Diagnosis not present

## 2021-09-19 DIAGNOSIS — R1310 Dysphagia, unspecified: Secondary | ICD-10-CM

## 2021-09-19 DIAGNOSIS — R059 Cough, unspecified: Secondary | ICD-10-CM | POA: Diagnosis not present

## 2021-09-19 DIAGNOSIS — Z0000 Encounter for general adult medical examination without abnormal findings: Secondary | ICD-10-CM | POA: Diagnosis not present

## 2021-09-19 DIAGNOSIS — I491 Atrial premature depolarization: Secondary | ICD-10-CM | POA: Diagnosis not present

## 2021-09-19 NOTE — Assessment & Plan Note (Signed)
-  Patient to go to GI appointment next week

## 2021-09-19 NOTE — Assessment & Plan Note (Signed)
Continued to discuss importance of alcohol cessation with patient and advised her that if she continues to drink alcohol as she is, she is at risk of developing liver cirrhosis, being hospitalized and could ultimately die of liver failure.

## 2021-09-19 NOTE — Assessment & Plan Note (Signed)
Only reports two coughing fits which led to vomiting over the past 2 months.  Lungs are clear to auscultation with good air movement.  Patient does not smoke cigarettes but has been smoking marijuana since age 71.  Advised patient to monitor for more coughing fits and we can discuss this further at a later visit as we did not have time to fully discuss today.

## 2021-09-19 NOTE — Patient Instructions (Signed)
It was great to see you! Thank you for allowing me to participate in your care!   Our plans for today:  - Go to your appointment with the stomach doctor next week - Return within the next month for a follow up visit  We are checking some labs today, I will call you if they are abnormal will send you a MyChart message or a letter if they are normal.  If you do not hear about your labs in the next 2 weeks please let us know.  Take care and seek immediate care sooner if you develop any concerns.   Dr. Precious Gilding, DO [facility]

## 2021-09-19 NOTE — Progress Notes (Cosign Needed)
    SUBJECTIVE:   CHIEF COMPLAINT / HPI:   Alcohol use disorder Went back to drinking hard liquor, drinks a 5th with her boyfriend every other day. Is not interested in quitting today and not interested in naltrexone.   Dysphagia Patient had previously been referred to GI with concern for esophagitis and had been unable to swallow solids.  Had appointment scheduled with GI on May 23 but pt states they canceled. Apt has been rescheduled for 09/26/2021.  Pt states she still having difficultly swallowing solids, especially meat. Was able to eat a bagel this morning after lots of chewing. She drinks an ensure on occasion.  Weight it stable.   Chest pain Mid substernal intermittent chest pain for past week occurring most days. It resolves on its own in less than a minute. Worse with deep breaths. She describes it as dull pain. Not worse with excerebration, does not feel it is related to anxiety. Sometimes she has coughing fits so bad that she vomits, this last occurred a week ago but has been happening for past couple months. She is on Protonix for GERD which she states works. She does not feel like GERD is the cause of her symptoms.     PERTINENT  PMH / PSH: Alcohol use disorder, GERD, dysphagia  OBJECTIVE:   Vitals:   09/19/21 1357  BP: 126/76  Pulse: 74  SpO2: 99%     General: NAD, pleasant, able to participate in exam Cardiac: Regular rate, irregular rhythm Respiratory: CTAB, normal effort, No wheezes, rales or rhonchi Abdomen: Bowel sounds present, nontender, nondistended, soft Extremities: no edema of BLEs Skin: warm and dry, no rashes noted Neuro: alert, no obvious focal deficits Psych: Normal affect and mood  ASSESSMENT/PLAN:   Dysphagia -Patient to get a GI appointment next week  Premature atrial contractions Patient is asymptomatic with no lightheadedness, no palpitations.  Patient was advised that no treatment is needed for PACs currently as she is  asymptomatic.  Chest pain Chest pain is very atypical in nature, it is intermittent, not worse with exertion or emotion.  It is very acute.  EKG was reassuring against MI.  Advised patient to monitor this chest pain and she was given return precautions.  Alcohol use disorder, severe, dependence Continued to discuss importance of alcohol cessation with patient and advised her that if she continues to drink alcohol as she is, she is at risk of developing liver cirrhosis, being hospitalized and could ultimately die of liver failure.  Cough Only reports two coughing fits which led to vomiting over the past 2 months.  Lungs are clear to auscultation with good air movement.  Patient does not smoke cigarettes but has been smoking marijuana since age 62.  Advised patient to monitor for more coughing fits and we can discuss this further at a later visit as we did not have time to fully discuss today.  Health maintenance Last lipid panel in 2019 showed total cholesterol of 213 and triglycerides mildly elevated to 180.  Last hemoglobin A1c in 2020 was WNL but with patient's age and alcohol use, we will recheck -Lipid panel today -Hemoglobin A1c today  Of note, patient briefly mentioned having excessive tearing and rhinorrhea, not related to allergies.  Likely due to aging.  We will discuss further at next visit. -Patient to return within 1 month for follow-up  Dr. Precious Gilding, [place]

## 2021-09-20 LAB — HEMOGLOBIN A1C
Est. average glucose Bld gHb Est-mCnc: 100 mg/dL
Hgb A1c MFr Bld: 5.1 % (ref 4.8–5.6)

## 2021-09-20 LAB — LIPID PANEL
Chol/HDL Ratio: 1.9 ratio (ref 0.0–4.4)
Cholesterol, Total: 235 mg/dL — ABNORMAL HIGH (ref 100–199)
HDL: 123 mg/dL (ref >39)
LDL Chol Calc (NIH): 99 mg/dL (ref 0–99)
Triglycerides: 77 mg/dL (ref 0–149)
VLDL Cholesterol Cal: 13 mg/dL (ref 5–40)

## 2021-09-21 ENCOUNTER — Telehealth: Payer: Self-pay | Admitting: Student

## 2021-09-27 ENCOUNTER — Ambulatory Visit: Payer: Medicare Other

## 2021-09-28 ENCOUNTER — Other Ambulatory Visit: Payer: Self-pay | Admitting: Student

## 2021-09-28 DIAGNOSIS — E7800 Pure hypercholesterolemia, unspecified: Secondary | ICD-10-CM

## 2021-09-28 MED ORDER — ROSUVASTATIN CALCIUM 20 MG PO TABS: 20.0000 mg | ORAL_TABLET | Freq: Every day | ORAL | 3 refills | Status: DC

## 2021-09-28 NOTE — Progress Notes (Signed)
Crestor 20 mg prescribed d/t ASCVD risk of 13%. Will recheck CMP in 3 months.

## 2021-10-11 ENCOUNTER — Ambulatory Visit: Payer: Medicare Other

## 2021-10-17 ENCOUNTER — Encounter: Payer: Self-pay | Admitting: Student

## 2021-10-17 ENCOUNTER — Ambulatory Visit (INDEPENDENT_AMBULATORY_CARE_PROVIDER_SITE_OTHER): Payer: Medicare Other | Admitting: Student

## 2021-10-17 VITALS — BP 118/68 | HR 74 | Ht 60.0 in | Wt 138.0 lb

## 2021-10-17 DIAGNOSIS — F1020 Alcohol dependence, uncomplicated: Secondary | ICD-10-CM

## 2021-10-17 DIAGNOSIS — R1031 Right lower quadrant pain: Secondary | ICD-10-CM

## 2021-10-17 DIAGNOSIS — R1310 Dysphagia, unspecified: Secondary | ICD-10-CM | POA: Diagnosis not present

## 2021-10-17 DIAGNOSIS — E7800 Pure hypercholesterolemia, unspecified: Secondary | ICD-10-CM | POA: Insufficient documentation

## 2021-10-17 NOTE — Assessment & Plan Note (Signed)
Abdominal pain is acute and not at all severe and resolves on its own.  Patient advised to monitor this pain and can also bring up with GI.  No medications or imaging necessary at this time.

## 2021-10-17 NOTE — Assessment & Plan Note (Signed)
Patient continues to have no interest in starting medication to curb alcohol cravings today.  She states she has no intention of reducing the amount of alcohol she drinks at this time. -CBC to check for anemia

## 2021-10-17 NOTE — Progress Notes (Signed)
    SUBJECTIVE:   CHIEF COMPLAINT / HPI:   Dysphagia Pt has loss of appetite and still having issues swallowing solids as noted in previous office visits.  Drinks supplement when unable to eat. Weight is stable from last visit, only lost 1 lb. Couldn't make last GI appointment d/t transportation issues. Rescheduled for July 24th at [place].    Abdominal pain Admits to some new achy abdominal pain over past couple weeks in RLQ. Occurs in the mornings but goes away throughout the day. Rates the pain only at a 2-3/10.   Hypercholesterolemia Was prescribed on Crestor 20 mg on 09/28/21. Just started this medication 2 days ago.    Cough States cough reported at last visit has gotten much better, hasn't had a coughing episode in over a month.   PERTINENT  PMH / PSH: Chronic alcohol use disorder, dysphagia, hypercholesterolemia  OBJECTIVE:   BP 118/68   Pulse 74   Ht 5' (1.524 m)   Wt 138 lb (62.6 kg)   BMI 26.95 kg/m    General: NAD, pleasant, able to participate in exam Cardiac: RRR, no murmurs. Respiratory: CTAB, normal effort, No wheezes, rales or rhonchi Abdomen: Bowel sounds present, nontender, nondistended, soft, no evidence of inguinal hernia when patient stands and coughs. Skin: warm and dry, no rashes noted Neuro: alert, no obvious focal deficits Psych: Normal affect and mood  ASSESSMENT/PLAN:   Dysphagia Patient encouraged strongly to make next appointment with GI on 7/24.  Advised patient that it is possible she could have cancer causing her dysphagia and this needs further evaluation promptly.  Patient expresses understanding and agrees to go to appointment.  Right lower quadrant abdominal pain Abdominal pain is acute and not at all severe and resolves on its own.  Patient advised to monitor this pain and can also bring up with GI.  No medications or imaging necessary at this time.   Alcohol use disorder Patient continues to have no interest in starting medication to  curb alcohol cravings today.  She states she has no intention of reducing the amount of alcohol she drinks at this time. -CBC to check for anemia  Hypercholesterolemia Patient just started statin.  Patient advised to return in 2 months for repeat lipid panel  Dr. Precious Gilding, [place]

## 2021-10-17 NOTE — Assessment & Plan Note (Addendum)
Patient encouraged strongly to make next appointment with GI on 7/24.  Advised patient that it is possible she could have cancer causing her dysphagia and this needs further evaluation promptly.  Patient expresses understanding and agrees to go to appointment.  Will obtain CBC today to check for anemia as patient has decreased p.o. intake along with alcohol use disorder.

## 2021-10-17 NOTE — Assessment & Plan Note (Signed)
Patient just started statin.  Patient advised to return in 2 months for repeat lipid panel

## 2021-10-17 NOTE — Patient Instructions (Signed)
It was great to see you! Thank you for allowing me to participate in your care!  Our plans for today:  - Please go to your GI appointment on July 24th - Return for a follow up visit in 2 months.  Take care and seek immediate care sooner if you develop any concerns.   Dr. Precious Gilding, DO [facility]

## 2021-10-18 LAB — CBC
Hematocrit: 30.3 % — ABNORMAL LOW (ref 34.0–46.6)
Hemoglobin: 10.6 g/dL — ABNORMAL LOW (ref 11.1–15.9)
MCH: 34.2 pg — ABNORMAL HIGH (ref 26.6–33.0)
MCHC: 35 g/dL (ref 31.5–35.7)
MCV: 98 fL — ABNORMAL HIGH (ref 79–97)
Platelets: 329 10*3/uL (ref 150–450)
RBC: 3.1 x10E6/uL — ABNORMAL LOW (ref 3.77–5.28)
RDW: 13.3 % (ref 11.7–15.4)
WBC: 4.8 10*3/uL (ref 3.4–10.8)

## 2021-10-19 ENCOUNTER — Encounter: Payer: Self-pay | Admitting: Student

## 2021-10-22 ENCOUNTER — Other Ambulatory Visit: Payer: Self-pay | Admitting: Gastroenterology

## 2021-10-22 DIAGNOSIS — R1310 Dysphagia, unspecified: Secondary | ICD-10-CM

## 2021-10-24 ENCOUNTER — Telehealth: Payer: Self-pay | Admitting: Student

## 2021-10-24 DIAGNOSIS — F1020 Alcohol dependence, uncomplicated: Secondary | ICD-10-CM

## 2021-10-24 MED ORDER — THIAMINE HCL 100 MG PO TABS
100.0000 mg | ORAL_TABLET | Freq: Every day | ORAL | 1 refills | Status: DC
Start: 2021-10-24 — End: 2022-06-25

## 2021-10-24 MED ORDER — FOLIC ACID 1 MG PO TABS: 1.0000 mg | ORAL_TABLET | Freq: Every day | ORAL | 1 refills | Status: DC

## 2021-10-24 NOTE — Telephone Encounter (Signed)
Pt called back. Spoke about starting thiamine and folate d/t heavy alcohol use which pt agrees to start.

## 2021-10-24 NOTE — Telephone Encounter (Signed)
Attempted to call pt with labs results and recommendation to start folic acid and thiamine supplementation d/t heavy alcohol use. Let voice mail that vitamin supplements were sent to pharmacy if she wanted to take them.

## 2021-11-01 ENCOUNTER — Ambulatory Visit
Admission: RE | Admit: 2021-11-01 | Discharge: 2021-11-01 | Disposition: A | Discharge status: Home / Self Care | Payer: Medicare Other | Source: Ambulatory Visit | Attending: Obstetrics and Gynecology | Admitting: Obstetrics and Gynecology

## 2021-11-01 DIAGNOSIS — Z1231 Encounter for screening mammogram for malignant neoplasm of breast: Secondary | ICD-10-CM

## 2021-11-02 ENCOUNTER — Other Ambulatory Visit: Payer: Medicare Other

## 2021-11-06 ENCOUNTER — Other Ambulatory Visit: Payer: Self-pay | Admitting: Gastroenterology

## 2021-11-06 ENCOUNTER — Ambulatory Visit
Admission: RE | Admit: 2021-11-06 | Discharge: 2021-11-06 | Disposition: A | Discharge status: Home / Self Care | Payer: Medicare Other | Source: Ambulatory Visit | Attending: Gastroenterology | Admitting: Gastroenterology

## 2021-11-06 DIAGNOSIS — R1310 Dysphagia, unspecified: Secondary | ICD-10-CM

## 2021-11-06 DIAGNOSIS — K219 Gastro-esophageal reflux disease without esophagitis: Secondary | ICD-10-CM | POA: Diagnosis not present

## 2021-11-09 ENCOUNTER — Encounter (HOSPITAL_COMMUNITY): Payer: Self-pay | Admitting: Gastroenterology

## 2021-11-09 NOTE — Progress Notes (Signed)
Attempted to obtain medical history via telephone, unable to reach at this time. Unable to leave voicemail to return pre surgical testing department's phone call.   ?

## 2021-11-12 ENCOUNTER — Encounter (HOSPITAL_COMMUNITY): Payer: Self-pay | Admitting: Gastroenterology

## 2021-11-12 NOTE — H&P (Signed)
History of Present Illness  General:  71 year old female, history of alcohol dependence, presents for evaluation of dysphagia. referred by PCP last seen by Dr. Michail Sermon 07/2014 Labwork 05/2021 AST 53/ALT 21/alkaline phosphatase 54 Colonoscopy 05/2019: Hyperplastic polyps and polypoid colonic mucosa, repeat 10 years EGD 07/2014 done for melena and hematemesis with Dr. Watt Climes: small hiatal hernia, proximal gastritis, small peripyloric ulcer with flat white base. Biopsies showed chronic active H. pylori. reported finishing antibiotic treatment, no proof of eradication via stool or repeat EGD. no history of previous ECHO Liver elastography 12/2018: diffuse fatty infiltration of the liver, risk of fibrosis is moderate patient states she has been having trouble swallowing. extremely vague and difficulty history. states "she will swallow food and then spit it back out" however, when she swallows the food "it goes down and doesn't get stuck." she also reports issues with liquids. states she will "just take the juices and feels like she has to spit them out, but when she swallows them they go down." feels like she gets full fast. able to eat without difficulty except for the times she feels the need to spit the food back out. states "she spits it back out and doesn't try to swallow it." this has been ongoing for years. reports 100lb weight loss over the course of the last few years due to "spitting out her food" heavy alcohol use. 1 pint of liquor daily since she was [age] years old. denies history of withdrawal seizures, though states if she goes a day without drinking she will become shakey. denies NSAIDs or tobacco products.   Current Medications  Taking   Gabapentin 100 MG Capsule 1 capsule Orally Once a day  Rosuvastatin Calcium 20 MG Tablet 1 tablet Orally Once a day  Amlodipine 5 mg capsules - by mouth - MUST BE PREPARED FREE OF ALL MAMMALIAN PRODUCTS 1 capsule by mouth every morning, Notes: '10mg'$    amLODIPine Besylate 10 MG Tablet 1 tablet Orally Once a day  Protonix(Pantoprazole Sodium) 40 MG Tablet Delayed Release 1 tablet Orally Twice a day  Vitamin D Capsule 50,000 units 1 capsule Orally Once a week  Not-Taking   Norvasc(amLODIPine Besylate) 2.5 MG Tablet 1 tablet Orally Once a day  Anastrozole 1 MG Tablet 1 tablet Orally Once a day  Anusol-HC(Hydrocortisone Acetate) 25 MG Suppository 1 suppository Rectal twice a day as needed  Avapro(Irbesartan) 150 MG Tablet 1 tablet Orally Once a day  Chlorthalidone 20 mg Tablet 1 tablet every morning Orally Once a day  Colace(Docusate Sodium) 100 MG Capsule 1 capsule as needed Orally twice a day as needed  HYDROcodone-Acetaminophen 5-400 MG Tablet 1 capsule as needed for pain Orally every 6 hrs  Klor-Con/EF(Potassium Bicarbonate) 25 MEQ Tablet Effervescent 6 tablets Orally 2 am, 2 lunch, 2 dinner  Meloxicam 15 MG Tablet 1 tablet Orally Once a day  Sulfamethoxazole-TMP DS 800-160 MG Tablet 1 tablet Orally every 12 hrs  Medication List reviewed and reconciled with the patient   Past Medical History  Hx of breast cancer.   Hypertension.    Surgical History  lumpectomy - radiation 05/2009  nipple removed on right breast 05/2009  finger surgery   EGD 2012,2016  colonoscopy 2011,2014,2021  hysterectomy    Family History  Father: deceased, colon cancer- age 28, diagnosed with Colon cancer  Mother: deceased  No family hx of colon polyps or liver disease.   Social History  General:  Tobacco use cigarettes: Never smoked, Tobacco history last updated 10/22/2021. no EXPOSURE TO  PASSIVE SMOKE. Alcohol: yes, beer, Daily. Recreational drug use: yes, some marijuana occasionally. OCCUPATION: retired.    Allergies  N.K.D.A.   Hospitalization/Major Diagnostic Procedure  back   GI Bleed 07/2014  hysterectomy 09/2021   Review of Systems  GI PROCEDURE:  Pacemaker/ AICD no. Artificial heart valves no. MI/heart attack no. Abnormal heart rhythm  no. Angina no. CVA no. Hypertension , YES. Hypotension no. Asthma, COPD no. Sleep apnea no. Seizure disorders no. Artificial joints no. Severe DJD no. Diabetes no. Significant headaches no. Vertigo no. Depression/anxiety no. Abnormal bleeding no. Kidney Disease no. Liver disease no. Chance of pregnancy no. Blood transfusion no.     Vital Signs  Wt 140.2, Wt change -19.8 lbs, Ht 60, BMI 27.38, Temp 98.1, Pulse sitting 83, BP sitting 155/89 166/90 first bp.   Examination  Gastroenterology Exam: GENERAL APPEARANCE: Well developed, well nourished, no active distress, pleasant, no acute distress. anxious and fidgety appearing. EYES: Lids and conjunctiva normal. Sclera normal, pupils equal and reactive. RESPIRATORY Breath sounds normal. Respiration even and unlabored. CARDIOVASCULAR Normal RRR w/o murmers or gallops. No peripheral edema. ABDOMEN No masses palpated. Liver and spleen not palpated, normal. Bowel sounds normal, Abdomen not distended. SKIN Warm and dry. PSYCHIATRIC Alert and oriented x3, mood and affect appear normal..     Assessments     1. Dysphagia, unspecified type - R13.10 (Primary)   2. Weight loss - R63.4   Treatment  1. Dysphagia, unspecified type    Barium swallow:mild GERD, small lesion in esophagus. needs EGD. needs to be done at hospital (due to history of alcoholism - extra precaution) ASAP.         Clinical Notes: very difficult and vague history. she struggles to articulate her symptoms despite adequate interviewing. unsure if patient is having issues with food getting stuck or issues initating swallow as she states "she often spits out food without swallowing." occurs both solids and liquids     2. Weight loss  Clinical Notes: she is unsure how much weight she has lost. thinks it may be close to 100lbs over the last 5-10 years. per chart review 20lb weight loss since last recorded OV visit in 2016. if continued weight loss, will evaluate with CT ab/pelvis. await  barium swallow results first,.    Plan: diagnostic EGD

## 2021-11-12 NOTE — Anesthesia Preprocedure Evaluation (Signed)
Anesthesia Evaluation  Patient identified by MRN, date of birth, ID band Patient awake    Reviewed: Allergy & Precautions, NPO status , Patient's Chart, lab work & pertinent test results  Airway Mallampati: III  TM Distance: >3 FB Neck ROM: Full   Comment: VERY NARROW PALATE Dental no notable dental hx. (+) Teeth Intact, Dental Advisory Given, Missing, Chipped,    Pulmonary neg pulmonary ROS, Patient abstained from smoking.,    Pulmonary exam normal breath sounds clear to auscultation       Cardiovascular hypertension, Pt. on medications Normal cardiovascular exam Rhythm:Regular Rate:Normal     Neuro/Psych PSYCHIATRIC DISORDERS  Neuromuscular disease    GI/Hepatic GERD  Medicated and Controlled,(+)     substance abuse  alcohol use,   Endo/Other  negative endocrine ROSObesity   Renal/GU negative Renal ROS     Musculoskeletal  (+) Arthritis , Osteoarthritis,    Abdominal   Peds  Hematology negative hematology ROS (+)   Anesthesia Other Findings General:  71 year old female, history of alcohol dependence, presents for evaluation of dysphagia. referred by PCP last seen by Dr. Michail Sermon 07/2014 Labwork 05/2021 AST 53/ALT 21/alkaline phosphatase 54  Reproductive/Obstetrics                            Anesthesia Physical  Anesthesia Plan  ASA: 3  Anesthesia Plan: MAC   Post-op Pain Management: Minimal or no pain anticipated   Induction: Intravenous  PONV Risk Score and Plan: 3 and Treatment may vary due to age or medical condition  Airway Management Planned: Simple Face Mask, Nasal Cannula and Natural Airway  Additional Equipment: None  Intra-op Plan:   Post-operative Plan:   Informed Consent: I have reviewed the patients History and Physical, chart, labs and discussed the procedure including the risks, benefits and alternatives for the proposed anesthesia with the patient or  authorized representative who has indicated his/her understanding and acceptance.     Dental advisory given  Plan Discussed with: CRNA, Anesthesiologist and Surgeon  Anesthesia Plan Comments:         Anesthesia Quick Evaluation

## 2021-11-13 ENCOUNTER — Ambulatory Visit (HOSPITAL_COMMUNITY): Payer: Medicare Other | Admitting: Anesthesiology

## 2021-11-13 ENCOUNTER — Encounter (HOSPITAL_COMMUNITY): Payer: Self-pay | Admitting: Gastroenterology

## 2021-11-13 ENCOUNTER — Encounter (HOSPITAL_COMMUNITY)
Admission: RE | Disposition: A | Discharge status: Home / Self Care | Payer: Self-pay | Source: Home / Self Care | Attending: Gastroenterology

## 2021-11-13 ENCOUNTER — Ambulatory Visit (HOSPITAL_BASED_OUTPATIENT_CLINIC_OR_DEPARTMENT_OTHER): Payer: Medicare Other | Admitting: Anesthesiology

## 2021-11-13 ENCOUNTER — Ambulatory Visit (HOSPITAL_COMMUNITY)
Admission: RE | Admit: 2021-11-13 | Discharge: 2021-11-13 | Disposition: A | Discharge status: Home / Self Care | Payer: Medicare Other | Attending: Gastroenterology | Admitting: Gastroenterology

## 2021-11-13 DIAGNOSIS — K449 Diaphragmatic hernia without obstruction or gangrene: Secondary | ICD-10-CM | POA: Insufficient documentation

## 2021-11-13 DIAGNOSIS — R634 Abnormal weight loss: Secondary | ICD-10-CM | POA: Diagnosis not present

## 2021-11-13 DIAGNOSIS — K2950 Unspecified chronic gastritis without bleeding: Secondary | ICD-10-CM | POA: Diagnosis not present

## 2021-11-13 DIAGNOSIS — K3189 Other diseases of stomach and duodenum: Secondary | ICD-10-CM | POA: Insufficient documentation

## 2021-11-13 DIAGNOSIS — M1990 Unspecified osteoarthritis, unspecified site: Secondary | ICD-10-CM | POA: Diagnosis not present

## 2021-11-13 DIAGNOSIS — K264 Chronic or unspecified duodenal ulcer with hemorrhage: Secondary | ICD-10-CM | POA: Insufficient documentation

## 2021-11-13 DIAGNOSIS — R1310 Dysphagia, unspecified: Secondary | ICD-10-CM | POA: Insufficient documentation

## 2021-11-13 DIAGNOSIS — K2289 Other specified disease of esophagus: Secondary | ICD-10-CM | POA: Diagnosis not present

## 2021-11-13 DIAGNOSIS — K219 Gastro-esophageal reflux disease without esophagitis: Secondary | ICD-10-CM | POA: Insufficient documentation

## 2021-11-13 DIAGNOSIS — F1021 Alcohol dependence, in remission: Secondary | ICD-10-CM | POA: Diagnosis not present

## 2021-11-13 DIAGNOSIS — I10 Essential (primary) hypertension: Secondary | ICD-10-CM | POA: Insufficient documentation

## 2021-11-13 DIAGNOSIS — R933 Abnormal findings on diagnostic imaging of other parts of digestive tract: Secondary | ICD-10-CM | POA: Diagnosis not present

## 2021-11-13 DIAGNOSIS — K269 Duodenal ulcer, unspecified as acute or chronic, without hemorrhage or perforation: Secondary | ICD-10-CM

## 2021-11-13 DIAGNOSIS — K319 Disease of stomach and duodenum, unspecified: Secondary | ICD-10-CM | POA: Diagnosis not present

## 2021-11-13 HISTORY — PX: BIOPSY: SHX5522

## 2021-11-13 HISTORY — PX: ESOPHAGOGASTRODUODENOSCOPY (EGD) WITH PROPOFOL: SHX5813

## 2021-11-13 SURGERY — ESOPHAGOGASTRODUODENOSCOPY (EGD) WITH PROPOFOL
Anesthesia: Monitor Anesthesia Care

## 2021-11-13 MED ORDER — PROPOFOL 10 MG/ML IV BOLUS
INTRAVENOUS | Status: DC | PRN
  Administered 2021-11-13: 80 mg via INTRAVENOUS

## 2021-11-13 MED ORDER — LACTATED RINGERS IV SOLN: INTRAVENOUS | Status: DC

## 2021-11-13 MED ORDER — LIDOCAINE HCL (CARDIAC) PF 100 MG/5ML IV SOSY
PREFILLED_SYRINGE | INTRAVENOUS | Status: DC | PRN
  Administered 2021-11-13: 80 mg via INTRAVENOUS

## 2021-11-13 MED ORDER — SODIUM CHLORIDE 0.9 % IV SOLN: INTRAVENOUS | Status: DC

## 2021-11-13 MED ORDER — PROPOFOL 1000 MG/100ML IV EMUL
INTRAVENOUS | Status: AC
  Filled 2021-11-13: qty 100

## 2021-11-13 MED ORDER — PROPOFOL 500 MG/50ML IV EMUL
INTRAVENOUS | Status: DC | PRN
  Administered 2021-11-13: 135 ug/kg/min via INTRAVENOUS

## 2021-11-13 MED ORDER — LACTATED RINGERS IV SOLN
INTRAVENOUS | Status: DC
  Administered 2021-11-13: 1000 mL via INTRAVENOUS

## 2021-11-13 MED ORDER — PROPOFOL 500 MG/50ML IV EMUL
INTRAVENOUS | Status: AC
  Filled 2021-11-13: qty 50

## 2021-11-13 SURGICAL SUPPLY — 15 items

## 2021-11-13 NOTE — Transfer of Care (Signed)
.   Immediate Anesthesia Transfer of Care Note  Patient: Carrie [name]  Procedure(s) Performed: Procedure(s): ESOPHAGOGASTRODUODENOSCOPY (EGD) WITH PROPOFOL (N/A) BIOPSY  Patient Location: Endoscopy Unit  Anesthesia Type:MAC  Level of Consciousness:  sedated, patient cooperative and responds to stimulation  Airway & Oxygen Therapy:Patient Spontanous Breathing and Patient connected to face mask oxgen  Post-op Assessment:  Report given to PACU RN and Post -op Vital signs reviewed and stable  Post vital signs:  Reviewed and stable  Last Vitals:  Vitals:   11/13/21 0753  BP: 137/69  Pulse: 78  Resp: 18  Temp: 36.7 C  SpO2: 16%    Complications: No apparent anesthesia complications

## 2021-11-13 NOTE — Op Note (Signed)
[facility] Patient Name: Carrie [name] Procedure Date: 11/13/2021 MRN: [id number] Attending MD: Ronnette Juniper , MD Date of Birth: 1950/12/28 CSN: [id number] Age: 71 Admit Type: Outpatient Procedure:                Upper GI endoscopy Indications:              Dysphagia, Abnormal cine-esophagram showed polypoid                            lesion in distal esopahgus, weight loss Providers:                Ronnette Juniper, MD, Benay Pillow, RN, Cherylynn Ridges,                            Technician, Arnoldo Hooker, CRNA Referring MD:             Precious Gilding, DO Medicines:                Monitored Anesthesia Care Complications:            No immediate complications. Estimated blood loss:                            Minimal. Estimated Blood Loss:     Estimated blood loss was minimal. Procedure:                Pre-Anesthesia Assessment:                           - Prior to the procedure, a History and Physical                            was performed, and patient medications and                            allergies were reviewed. The patient's tolerance of                            previous anesthesia was also reviewed. The risks                            and benefits of the procedure and the sedation                            options and risks were discussed with the patient.                            All questions were answered, and informed consent                            was obtained. Prior Anticoagulants: The patient has                            taken no previous anticoagulant or antiplatelet  agents. ASA Grade Assessment: III - A patient with                            severe systemic disease. After reviewing the risks                            and benefits, the patient was deemed in                            satisfactory condition to undergo the procedure.                           After obtaining informed consent, the endoscope was                             passed under direct vision. Throughout the                            procedure, the patient's blood pressure, pulse, and                            oxygen saturations were monitored continuously. The                            [id number]-H190 ([id number]) Olympus endoscope was introduced                            through the mouth, and advanced to the second part                            of duodenum. The upper GI endoscopy was                            accomplished without difficulty. The patient                            tolerated the procedure well. Scope In: Scope Out: Findings:      No endoscopic abnormality was evident in the esophagus to explain the       patient's complaint of dysphagia. Biopsies were obtained from the       proximal and distal esophagus with cold forceps for histology of       suspected eosinophilic esophagitis.      A single 5 mm submucosal nodule with a localized distribution was found       in the distal esophagus, 35 cm from the incisors. Biopsies were taken       with a cold forceps for histology.      A 3 cm hiatal hernia was present.      Multiple dispersed small erosions with no bleeding and no stigmata of       recent bleeding were found in the cardia, in the gastric fundus, in the       gastric body and in the gastric antrum. Biopsies were taken with a cold       forceps for Helicobacter pylori testing.  A few diffuse erosions without bleeding were found in the duodenal bulb       and in the first portion of the duodenum. Impression:               - No endoscopic esophageal abnormality to explain                            patient's dysphagia. Biopsied.                           - Submucosal nodule found in the esophagus.                            Biopsied.                           - 3 cm hiatal hernia.                           - Erosive gastropathy with no bleeding and no                            stigmata of recent bleeding. Biopsied.                            - Duodenal erosions without bleeding. Moderate Sedation:      Patient did not receive moderate sedation for this procedure, but       instead received monitored anesthesia care. Recommendation:           - Patient has a contact number available for                            emergencies. The signs and symptoms of potential                            delayed complications were discussed with the                            patient. Return to normal activities tomorrow.                            Written discharge instructions were provided to the                            patient.                           - Resume regular diet.                           - Continue present medications.                           - Await pathology results. Procedure Code(s):        --- Professional ---  29562, Esophagogastroduodenoscopy, flexible,                            transoral; with biopsy, single or multiple Diagnosis Code(s):        --- Professional ---                           R13.10, Dysphagia, unspecified                           K22.8, Other specified diseases of esophagus                           K44.9, Diaphragmatic hernia without obstruction or                            gangrene                           K31.89, Other diseases of stomach and duodenum                           K26.9, Duodenal ulcer, unspecified as acute or                            chronic, without hemorrhage or perforation                           R93.3, Abnormal findings on diagnostic imaging of                            other parts of digestive tract CPT copyright 2019 [employer]. All rights reserved. The codes documented in this report are preliminary and upon coder review may  be revised to meet current compliance requirements. Ronnette Juniper, MD 11/13/2021 9:39:52 AM This report has been signed electronically. Number of Addenda: 0

## 2021-11-13 NOTE — Discharge Instructions (Signed)
YOU HAD AN ENDOSCOPIC PROCEDURE TODAY: Refer to the procedure report and other information in the discharge instructions given to you for any specific questions about what was found during the examination. If this information does not answer your questions, please call [facility] office at [phone] to clarify.   YOU SHOULD EXPECT: Some feelings of bloating in the abdomen. Passage of more gas than usual. Walking can help get rid of the air that was put into your GI tract during the procedure and reduce the bloating. If you had a lower endoscopy (such as a colonoscopy or flexible sigmoidoscopy) you may notice spotting of blood in your stool or on the toilet paper. Some abdominal soreness may be present for a day or two, also.  DIET: Your first meal following the procedure should be a light meal and then it is ok to progress to your normal diet. A half-sandwich or bowl of soup is an example of a good first meal. Heavy or fried foods are harder to digest and may make you feel nauseous or bloated. Drink plenty of fluids but you should avoid alcoholic beverages for 24 hours. If you had a esophageal dilation, please see attached instructions for diet.    ACTIVITY: Your care partner should take you home directly after the procedure. You should plan to take it easy, moving slowly for the rest of the day. You can resume normal activity the day after the procedure however YOU SHOULD NOT DRIVE, use power tools, machinery or perform tasks that involve climbing or major physical exertion for 24 hours (because of the sedation medicines used during the test).   SYMPTOMS TO REPORT IMMEDIATELY: A gastroenterologist can be reached at any hour. Please call [phone]  for any of the following symptoms:  Following upper endoscopy (EGD, EUS, ERCP, esophageal dilation) Vomiting of blood or coffee ground material  New, significant abdominal pain  New, significant chest pain or pain under the shoulder blades  Painful or  persistently difficult swallowing  New shortness of breath  Black, tarry-looking or red, bloody stools  FOLLOW UP:  If any biopsies were taken you will be contacted by phone or by letter within the next 1-3 weeks. Call [phone]  if you have not heard about the biopsies in 3 weeks.  Please also call with any specific questions about appointments or follow up tests. YOU HAD AN ENDOSCOPIC PROCEDURE TODAY: Refer to the procedure report and other information in the discharge instructions given to you for any specific questions about what was found during the examination. If this information does not answer your questions, please call [facility] office at [phone] to clarify.   

## 2021-11-13 NOTE — Progress Notes (Signed)
Pt waiting on transportation in [place]

## 2021-11-14 ENCOUNTER — Encounter (HOSPITAL_COMMUNITY): Payer: Self-pay | Admitting: Gastroenterology

## 2021-11-14 LAB — SURGICAL PATHOLOGY

## 2021-11-14 NOTE — Anesthesia Postprocedure Evaluation (Signed)
Anesthesia Post Note  Patient: Carrie [name]  Procedure(s) Performed: ESOPHAGOGASTRODUODENOSCOPY (EGD) WITH PROPOFOL BIOPSY     Patient location during evaluation: PACU Anesthesia Type: MAC Level of consciousness: awake and alert Pain management: pain level controlled Vital Signs Assessment: post-procedure vital signs reviewed and stable Respiratory status: spontaneous breathing, nonlabored ventilation, respiratory function stable and patient connected to nasal cannula oxygen Cardiovascular status: stable and blood pressure returned to baseline Postop Assessment: no apparent nausea or vomiting Anesthetic complications: no   No notable events documented.  Last Vitals:  Vitals:   11/13/21 0950 11/13/21 1000  BP: (!) 148/64 (!) 167/70  Pulse: 84 77  Resp: (!) 21 16  Temp:    SpO2: 97% 97%    Last Pain:  Vitals:   11/13/21 0939  TempSrc: Temporal  PainSc: 0-No pain   Pain Goal:                   Mcihael Hinderman

## 2021-11-21 ENCOUNTER — Telehealth: Payer: Self-pay

## 2021-11-21 NOTE — Telephone Encounter (Signed)
Patient calls nurse line reporting intermittent dizziness when she stands.   Patient reports symptoms resolve in ~5-10 minutes and she feels fine.   Patient denies any syncope episodes, headaches or vision changes. Patient is unable to check her blood pressure at home. Patient reports she is compliant with medications. Patient reports she does not stay well hydrated.   Apt offered for tomorrow, however patient needs greater than 3 days for transportation.   No apts at the start of next week at this time. Patient scheduled for 8/30.   Patient advised to stay well hydrated.   Strict ED precautions given to patient.

## 2021-11-23 ENCOUNTER — Other Ambulatory Visit: Payer: Self-pay

## 2021-11-23 ENCOUNTER — Ambulatory Visit: Payer: Medicare Other | Attending: Gastroenterology

## 2021-11-23 DIAGNOSIS — R1310 Dysphagia, unspecified: Secondary | ICD-10-CM | POA: Insufficient documentation

## 2021-11-23 NOTE — Therapy (Signed)
OUTPATIENT SPEECH LANGUAGE PATHOLOGY SWALLOW EVALUATION   Patient Name: Carrie [name] MRN: [id number] DOB:[birth year], 71 y.o., female Today's Date: 11/23/2021  PCP: Precious Gilding, MD  REFERRING PROVIDER: Minda Ditto, PA   End of Session - 11/23/21 2351     Visit Number 1    Number of Visits 21    Date for SLP Re-Evaluation 02/01/22    SLP Start Time 0803    SLP Stop Time  0853    SLP Time Calculation (min) 50 min    Activity Tolerance Patient tolerated treatment well             Past Medical History:  Diagnosis Date   Arthritis    "back, right hand" (07/11/2014)   Breast cancer ([place]) 2011   right breast   Breast cancer, right breast ([place] [facility]) 05/2009   Chronic lower back pain    GERD (gastroesophageal reflux disease)    Hepatic steatosis 07/17/2011   Hypertension    Hypokalemia 07/17/2011   Lower GI bleeding 07/11/2014 hospitalized   Neuromuscular disorder ([facility])    numbness in legs and feet   Personal history of radiation therapy 2011   Past Surgical History:  Procedure Laterality Date   BIOPSY  11/13/2021   Procedure: BIOPSY;  Surgeon: Ronnette Juniper, MD;  Location: [facility] END[facility]COPY;  Service: Gastroenterology;;   BREAST BIOPSY Right 2011   BREAST LUMPECTOMY Right 2011   COLONOSCOPY     ESOPHAGOGASTRODUODENOSCOPY N/A 07/12/2014   Procedure: ESOPHAGOGASTRODUODENOSCOPY (EGD);  Surgeon: Clarene Essex, MD;  Location: [facility] ENDOSCOPY;  Service: Endoscopy;  Laterality: N/A;   ESOPHAGOGASTRODUODENOSCOPY (EGD) WITH PROPOFOL N/A 11/13/2021   Procedure: ESOPHAGOGASTRODUODENOSCOPY (EGD) WITH PROPOFOL;  Surgeon: Ronnette Juniper, MD;  Location: [facility] ENDOSCOPY;  Service: Gastroenterology;  Laterality: N/A;   FINGER SURGERY     middle finger on left hand   HYSTERECTOMY ABDOMINAL WITH SALPINGO-OOPHORECTOMY Bilateral 05/11/2020   Procedure: TOTAL ABDOMINAL HYSTERECTOMY WITH BILATERAL SALPINGO-OOPHORECTOMY;  Surgeon: Molli Posey, MD;  Location: [facility]S;  Service: Gynecology;  Laterality: Bilateral;   NEEDS BED   MASTECTOMY COMPLETE / SIMPLE W/ SENTINEL NODE BIOPSY  05/2009   Archie Endo 05/23/2009   TOTAL KNEE ARTHROPLASTY Right 10/23/2016   TOTAL KNEE ARTHROPLASTY Right 10/23/2016   Procedure: RIGHT TOTAL KNEE ARTHROPLASTY;  Surgeon: [name], MD;  Location: [facility];  Service: Orthopedics;  Laterality: Right;   Patient Active Problem List   Diagnosis Date Noted   Right lower quadrant abdominal pain 10/17/2021   Hypercholesterolemia 10/17/2021   Premature atrial contractions 09/19/2021   Cough 09/19/2021   Dysphagia 07/03/2021   Weight loss 09/18/2020   Unilateral primary osteoarthritis, left knee 07/12/2020   Ovarian cyst 05/11/2020   Balance problem 11/30/2019   Lipoma of face 09/06/2018   Neuropathy 04/17/2017   Bilateral sensorineural hearing loss 07/18/2016   Spinal stenosis of lumbar region 02/14/2016   Essential hypertension, benign 07/23/2013   GERD (gastroesophageal reflux disease) 07/23/2013   Breast cancer, left breast ([place]) 07/17/2011   FIBROIDS, UTERUS 01/30/2007   Alcohol use disorder, severe, dependence ([place]) 01/30/2007    ONSET DATE: "A couple years ago"   REFERRING DIAG: R13.10 (ICD-10-CM) - Dysphagia, unspecified R93.3 (ICD-10-CM) - Abnormal findings on diagnostic imaging of other parts of digestive tract   THERAPY DIAG:  Dysphagia, unspecified type - Plan: SLP modified barium swallow  Rationale for Evaluation and Treatment Rehabilitation  SUBJECTIVE:   SUBJECTIVE STATEMENT: "My hand weakness and balance started all of a sudden after I couldn't swallow." Pt accompanied by: self  PERTINENT  HISTORY:  "...patient states she has been having trouble swallowing. extremely vague and difficulty history. states "she will swallow food and then spit it back out" however, when she swallows the food "it goes down and doesn't get stuck." she also reports issues with liquids. states she will "just take the juices and feels like she has to spit them out, but when she  swallows them they go down." feels like she gets full fast. able to eat without difficulty except for the times she feels the need to spit the food back out. states "she spits it back out and doesn't try to swallow it." this has been ongoing for years. reports 100lb weight loss over the course of the last few years due to "spitting out her food" heavy alcohol use. 1 pint of liquor daily since she was [age] years old. denies history of withdrawal seizures, though states if she goes a day without drinking she will become shakey.denies NSAIDs or tobacco products."  PAIN:  Are you having pain? No  FALLS: Has patient fallen in last 6 months?  Yes - 2  LIVING ENVIRONMENT: Lives with: lives alone Lives in: House/apartment  PLOF:  Level of assistance: Independent with ADLs Employment: Retired   PATIENT GOALS: Swallow safely  OBJECTIVE:   DIAGNOSTIC FINDINGS:  Esophagram/Barium swallow 11-06-21: FINDINGS: The patient had some difficulty initiating swallowing, however, no evidence of vestibular penetration or aspiration. Pharynx and cervical esophagus are unremarkable.  No evidence of esophageal stricture. A persistent small sessile polypoid lesion is seen in the esophageal ampulla. No other mucosal lesions identified. Esophageal motility is within normal limits.  No evidence of hiatal hernia. Mild gastroesophageal reflux is seen to the level of the midthoracic esophagus. An ingested 20m barium tablet passed freely through the esophagus, and into the stomach.  IMPRESSION: Mild gastroesophageal reflux. No evidence of hiatal hernia or esophageal stricture.  Persistent small sessile polypoid lesion in the esophageal ampulla. Esophageal neoplasm cannot be excluded. Upper endoscopy is recommended for further evaluation.  Patient had difficulty initiating swallowing. Consider referral to speech pathology for further evaluation.  COGNITION: Overall cognitive status: Within functional limits  for tasks assessed  ORAL MOTOR EXAMINATION Overall status: Impaired: Lingual: Right (ROM and Strength) Velum: Strength Comments: Pt with difficulty with tongue sweep in upper labial sulcus, rt > lt.  CLINICAL SWALLOW ASSESSMENT:   Current diet: Dysphagia 3 (mechanical soft), Dysphagia 2 (chopped/minced), Dysphagia 1 (puree), and thin liquids Dentition: adequate natural dentition Patient directly observed with POs: Yes: dysphagia 3 (soft), dysphagia 1 (puree), and thin liquids  Feeding: able to feed self Liquids provided by:  bottle Oral phase signs and symptoms: prolonged mastication, prolonged bolus formation, and oral holding Pharyngeal phase signs and symptoms:  nothing noted today with POs Today, pt exhibited oral holding with all solids (applesauce, and cereal bar) and liquids, but liquids were held longer than solids. SLP unsure why this is occurring at this time. However pt reports some s/sx of neurological involvement and neurology consult may be warranted. A modified barium swallow (MBS) is warranted.     PATIENT REPORTED OUTCOME MEASURES (PROM): EAT-10: Pt responded "4=severe problem" for swallowing solids, swallowing pills, and stressful swallowing. "3" with swallowing liquids, and food sticking in throat , with a total 22/40 (lower scores indicate better QOL, considering swallowing)   TODAY'S TREATMENT:  Discussed results and recommendations from assessment. Explained basics of Modified Barium Swallow exam and that she should eat soft foods including puree at this time, with thin liquids. SLP  suggested pt should eat mostly pureed with thin liquids, if this is the food texture that is easiest for pt to consume at this time.   PATIENT EDUCATION: Education details: see above Person educated: Patient Education method: Explanation and Verbal cues Education comprehension: verbalized understanding   ASSESSMENT:  CLINICAL IMPRESSION: Patient is a 71 y.o. female who was seen  today for assessment (clinical) of dysphagia, and who exhibits rt lingual weakness and reduced ROM, among reporting other neurological symptoms (decr'd balance, and rt hand weakness). A modified barium swallow exam is recommended. [facility]erral sent to [facility] for approval. SLP suggested neuro evaluation to pt's PCP - Dr. Ronnald Ramp to consider this after seeing pt on 11-28-21 for vertigo/dizziness.  OBJECTIVE IMPAIRMENTS include dysphagia. These impairments are limiting patient from safety when swallowing. Factors affecting potential to achieve goals and functional outcome are cooperation/participation level and alcohol dependence . Patient will benefit from skilled SLP services to address above impairments and improve overall function.  REHAB POTENTIAL: Fair dependent upon pt cooperation/participation   GOALS: Goals reviewed with patient? No  SHORT TERM GOALS: Target date: 01/04/2022 4  Follow swallow precautions in 2 sessions with modified independence Baseline: Goal status: INITIAL  2.  Complete HEP for dysphagia in 2 sessions with rare min A Baseline:  Goal status: INITIAL  3.  Pt will undergo objective swallow assessment (MBS)   Baseline:  Goal Status: INITIAL   LONG TERM GOALS: Target date: 02/01/2022   Follow swallow precautions in 3 sessions Baseline:  Goal status: INITIAL  2.  Complete HEP for dysphagia in 2 sessions with rare min A Baseline:  Goal status: INITIAL   PLAN: SLP FREQUENCY: 1-2x/week  SLP DURATION: 10 weeks  PLANNED INTERVENTIONS: Aspiration precaution training, Pharyngeal strengthening exercises, Diet toleration management , Trials of upgraded texture/liquids, Cueing hierachy, Internal/external aids, SLP instruction and feedback, Compensatory strategies, and Patient/family education    [facility], [place] 11/23/2021, 11:59 PM

## 2021-11-27 ENCOUNTER — Telehealth (HOSPITAL_COMMUNITY): Payer: Self-pay

## 2021-11-27 NOTE — Telephone Encounter (Signed)
Attempted to contact patient to schedule Modified Barium swallow - left voicemail.

## 2021-11-28 ENCOUNTER — Ambulatory Visit (INDEPENDENT_AMBULATORY_CARE_PROVIDER_SITE_OTHER): Payer: Medicare Other | Admitting: Family Medicine

## 2021-11-28 ENCOUNTER — Encounter: Payer: Self-pay | Admitting: Family Medicine

## 2021-11-28 ENCOUNTER — Ambulatory Visit (HOSPITAL_COMMUNITY)
Admission: RE | Admit: 2021-11-28 | Discharge: 2021-11-28 | Disposition: A | Discharge status: Home / Self Care | Payer: Medicare Other | Source: Ambulatory Visit | Attending: Neonatology | Admitting: Neonatology

## 2021-11-28 VITALS — BP 133/65 | HR 74 | Ht 60.0 in | Wt 143.8 lb

## 2021-11-28 DIAGNOSIS — R9431 Abnormal electrocardiogram [ECG] [EKG]: Secondary | ICD-10-CM | POA: Insufficient documentation

## 2021-11-28 DIAGNOSIS — R42 Dizziness and giddiness: Secondary | ICD-10-CM | POA: Diagnosis not present

## 2021-11-28 DIAGNOSIS — R7401 Elevation of levels of liver transaminase levels: Secondary | ICD-10-CM | POA: Diagnosis not present

## 2021-11-28 DIAGNOSIS — I498 Other specified cardiac arrhythmias: Secondary | ICD-10-CM | POA: Insufficient documentation

## 2021-11-28 DIAGNOSIS — R0789 Other chest pain: Secondary | ICD-10-CM | POA: Diagnosis not present

## 2021-11-28 DIAGNOSIS — R7989 Other specified abnormal findings of blood chemistry: Secondary | ICD-10-CM

## 2021-11-28 DIAGNOSIS — D649 Anemia, unspecified: Secondary | ICD-10-CM | POA: Diagnosis not present

## 2021-11-28 DIAGNOSIS — F1020 Alcohol dependence, uncomplicated: Secondary | ICD-10-CM

## 2021-11-28 NOTE — Progress Notes (Deleted)
    SUBJECTIVE:   CHIEF COMPLAINT / HPI:   *** Amlodipine: early morning, does not take regular 3-4 days of the week  Gabapentin: takes one tablet once per day only when I feel I need it     PERTINENT  PMH / PSH: ***  OBJECTIVE:   BP 133/65   Pulse 74   Ht 5' (1.524 m)   Wt 143 lb 12.8 oz (65.2 kg)   SpO2 100%   BMI 28.08 kg/m    Orthostatics: Orthostatic VS for the past 24 hrs:  BP- Lying Pulse- Lying BP- Sitting Pulse- Sitting BP- Standing at 0 minutes Pulse- Standing at 0 minutes  11/28/21 0906 130/68 71 133/65 74 119/72 74      ASSESSMENT/PLAN:   No problem-specific Assessment & Plan notes found for this encounter.     Ezequiel Essex, MD [place]

## 2021-11-28 NOTE — Progress Notes (Signed)
SUBJECTIVE:   CHIEF COMPLAINT / HPI:   Dizziness Patient reports intermittent dizziness.  Cannot specify how often, but it is happening "every once in a while".  She reports this has been going on for only a couple of weeks.  It really concerned her.  She reports trying to drink more water, she now drinks 2 to 3 cups or bottles of water in a day.  Before she was drinking none.  She does drink 1 cup of coffee maybe a couple days a week.  No other caffeine intake, denies energy drinks, tea, or daily coffee.  Does report heavy alcohol use, that is most of her fluid intake during the day.  She cannot specify how much she drinks.  Says that she and her boyfriend split a pint and a day.  Sometimes when they split a fifth, they have other people there to drink with them.  She takes amlodipine 10 mg intermittently, possibly 2 to 4 days out of the week.  She is also prescribed gabapentin but only takes 1 tablet once a day when she thinks she needs it.  Chest tightness when walking Patient reports predictable chest tightness when walking.  She can walk about 1 block before symptoms start.  Denies vision changes, dizziness with the chest tightness, syncope, or sweatiness.  Clearly describes it as chest tightness and not chest pain.  Resolves with rest.  Seen for this previously by PCP 09/19/2021.  EKG at the time without ST elevation, did show some PACs.  She reports never seen a heart doctor before for this.  She reports she is "tired of doctors".  She has never undergone an echo or stress test to her knowledge.  Chart review concurs.  Currently on amlodipine 10 mg and rosuvastatin 20 mg.  Takes them intermittently, possibly 2 to 4 days out of the week.  Latest lipids below. Lab Results  Component Value Date   CHOL 235 (H) 09/19/2021   HDL 123 09/19/2021   LDLCALC 99 09/19/2021   TRIG 77 09/19/2021   CHOLHDL 1.9 09/19/2021    PERTINENT  PMH / PSH: Severe alcohol use disorder, PACs, HTN,  lumbar spinal stenosis, bilateral sensorineural hearing loss, uterine fibroids, left breast cancer, dysphagia, HLD  OBJECTIVE:   BP 133/65   Pulse 74   Ht 5' (1.524 m)   Wt 143 lb 12.8 oz (65.2 kg)   SpO2 100%   BMI 28.08 kg/m    Orthostatics: Lying 130/68, 71 bpm, 100% O2 Sitting 133/65, 74 bpm, 100% O2 Standing 119/72, 79 bpm, 100% O2  General: Tired appearing woman, depressed affect, slowed mentation and expression HEENT: Tacky oral mucosa, thyroid unremarkable to palpation Cards: Regular rate and rhythm, 3/5 systolic murmur at right sternal border, cap refill 3 to 4 seconds, delayed skin turgor Respiratory: CTAB  ASSESSMENT/PLAN:   Dizzy Positive orthostatics, most likely related to dehydration.  Fluid intake during the day is mostly alcohol.  Given that she takes amlodipine sporadically, recommend she stop it altogether at this time.  Encouraged increased water intake, goal of 8 bottles of water in a day. Return in 2 weeks for follow-up.  Chest tightness Given this happens particularly with ambulation and resolves with rest, concern for stable angina.  Patient is never been to a cardiologist or undergone testing for this.  Will order exercise stress test as she is able to ambulate.  We will follow-up results.  Low threshold to refer to cardiology.  LDL currently at goal with rosuvastatin  20 mg intermittent use.     Ezequiel Essex, MD [place]

## 2021-11-28 NOTE — Assessment & Plan Note (Signed)
Given this happens particularly with ambulation and resolves with rest, concern for stable angina.  Patient is never been to a cardiologist or undergone testing for this.  Will order exercise stress test as she is able to ambulate.  We will follow-up results.  Low threshold to refer to cardiology.  LDL currently at goal with rosuvastatin 20 mg intermittent use.

## 2021-11-28 NOTE — Assessment & Plan Note (Addendum)
Positive orthostatics, most likely related to dehydration.  Fluid intake during the day is mostly alcohol.  Given that she takes amlodipine sporadically, recommend she stop it altogether at this time.  Encouraged increased water intake, goal of 8 bottles of water in a day. Return in 2 weeks for follow-up.

## 2021-11-28 NOTE — Patient Instructions (Signed)
It was wonderful to see you today. Thank you for allowing me to be a part of your care. Below is a short summary of what we discussed at your visit today:  Dizziness In the office today, your blood pressure dropped when you went from sitting to standing.  This usually means you do not have enough blood volume, such as from dehydration or blood loss.  I want you to drink more water.  Your goal is 8 bottles in a day.  If this makes you feel better, you can drink even more water, maybe 10 or 12 bottles in a day.  Stop taking the amlodipine for now.  Since you take it only several days in the week, simply stop it altogether for now.  We also collected some blood work today before you left.  This blood work was to check your blood counts and iron, to see if you are anemic.  If the results are normal, I will send you a letter.  If anything is out of the ordinary, I will give you a call.  Chest tightness when walking Today we did a test to look at your heart, called an EKG. We talked about it before you left.  I want you to do an exercise test for your heart.  I have ordered this in the cardiology clinic should be calling you soon to schedule it.  This test is where they stick some wires to your chest with stickers and have you walk on a treadmill.  Since your symptoms come on with walking, we will try to see if your heart shows any changes while walking.  Health Maintenance We like to think about ways to keep you healthy for years to come. Below are some interventions and screenings we can offer to keep you healthy: -Flu vaccine (available here at the family medicine clinic in September) -Shingles vaccine, pneumococcal pneumonia vaccine (age appropriate and available at your favorite pharmacy) -COVID-vaccine (available at any pharmacy)  Please bring all of your medications to every appointment!  If you have any questions or concerns, please do not hesitate to contact us via phone or MyChart  message.   Ezequiel Essex, MD

## 2021-11-29 ENCOUNTER — Telehealth: Payer: Self-pay | Admitting: Family Medicine

## 2021-11-29 LAB — COMPREHENSIVE METABOLIC PANEL
ALT: 15 IU/L (ref 0–32)
AST: 28 IU/L (ref 0–40)
Albumin/Globulin Ratio: 1.4 (ref 1.2–2.2)
Albumin: 4.3 g/dL (ref 3.9–4.9)
Alkaline Phosphatase: 44 IU/L (ref 44–121)
BUN/Creatinine Ratio: 14 (ref 12–28)
BUN: 10 mg/dL (ref 8–27)
Bilirubin Total: 0.2 mg/dL (ref 0.0–1.2)
CO2: 23 mmol/L (ref 20–29)
Calcium: 10.1 mg/dL (ref 8.7–10.3)
Chloride: 98 mmol/L (ref 96–106)
Creatinine, Ser: 0.7 mg/dL (ref 0.57–1.00)
Globulin, Total: 3 g/dL (ref 1.5–4.5)
Glucose: 86 mg/dL (ref 70–99)
Potassium: 3.9 mmol/L (ref 3.5–5.2)
Sodium: 138 mmol/L (ref 134–144)
Total Protein: 7.3 g/dL (ref 6.0–8.5)
eGFR: 94 mL/min/{1.73_m2} (ref >59)

## 2021-11-29 LAB — CBC
Hematocrit: 24.1 % — ABNORMAL LOW (ref 34.0–46.6)
Hemoglobin: 7.8 g/dL — ABNORMAL LOW (ref 11.1–15.9)
MCH: 31.2 pg (ref 26.6–33.0)
MCHC: 32.4 g/dL (ref 31.5–35.7)
MCV: 96 fL (ref 79–97)
Platelets: 534 10*3/uL — ABNORMAL HIGH (ref 150–450)
RBC: 2.5 x10E6/uL — CL (ref 3.77–5.28)
RDW: 13.3 % (ref 11.7–15.4)
WBC: 8 10*3/uL (ref 3.4–10.8)

## 2021-11-29 LAB — IRON,TIBC AND FERRITIN PANEL
Ferritin: 42 ng/mL (ref 15–150)
Iron Saturation: 6 % — CL (ref 15–55)
Iron: 22 ug/dL — ABNORMAL LOW (ref 27–139)
Total Iron Binding Capacity: 376 ug/dL (ref 250–450)
UIBC: 354 ug/dL (ref 118–369)

## 2021-11-29 NOTE — Telephone Encounter (Signed)
Called patient regarding lab results. Iron is quite low and hemoglobin has dropped several points since her check in July.   Confirmed correct patient with date of birth.  Discussed hemoglobin and iron results with patient.  Emphasized need for her to come back into clinic for a full work-up including recheck of H&H.  Attempted to get her worked in this afternoon and her access to care slots, however she reports she cannot get a ride this quick.  She needs 3 days notice ahead of time to schedule her medical transport rides.  I scheduled her Tuesday in access to care at 8:55 AM.  Emergency room precautions discussed.  Emphasized use of our after-hours line if she has any questions whatsoever.  At the appointment Tuesday, we will need to redraw CBC or H&H for confirmation.  Last colonoscopy 2014.  At the Tuesday appointment, based on history and physical, could consider scheduling IV iron transfusion and possible urgent referral to GI for scope.  Ezequiel Essex, MD

## 2021-12-03 NOTE — Progress Notes (Signed)
    SUBJECTIVE:   CHIEF COMPLAINT / HPI:   71 year old female presents for follow up after a recent iron study. Lab study showed Hgb  7.8,  ferritin 42, Iron sat 6 and Iron 22.  Iron study is consistent with IDA Last Colonoscopy was normal in 2014 Denies any blood in stool but reports intermittent dark stool. No Vaginal bleeding Patient report she has shortness of breath with long distant work and is scheduled for stress test tomorrow.  She denies any dizziness, or chest pain   PERTINENT  PMH / PSH: Dysphasia, GERD, HTN, fibroid  OBJECTIVE:   BP 137/64   Pulse 75   Ht 5' (1.524 m)   Wt 149 lb (67.6 kg)   SpO2 100%   BMI 29.10 kg/m    Physical Exam General: Alert, well appearing, NAD Cardiovascular: RRR, No Murmurs, Normal S2/S2 Respiratory: CTAB, No wheezing or Rales Extremities: +2 bilateral ankle edema        ASSESSMENT/PLAN:   No problem-specific Assessment & Plan notes found for this encounter. Anemia Patient's hemoglobin today was 8.4 which is improved from couple of days prior.  Lab findings mixed results of iron deficiency anemia versus anemia of chronic disease given normal MCV and borderline ferritin levels. Appears asymptomatic and no indication for Iron infusion at this time -Started patient on ferrous sulfate 325 every other day -GI referral for colonoscopy   Bilateral LE edema  Patient on exam had was noted to have +2 bilateral ankle edema.  Reports she has had this intermittently and has been on amlodipine for years. Doesn't apear to be bothered by it.  Differential diagnosis for her edema include amlodipine induced edema, anemia or venous insufficiency given her age.  Via shared decision with patient will leave her on amlodipine when blood pressure is well controlled.  Recommend use of over the counter compression socks and elevation of legs. Discussed option of discontinuing medication should edema become bothersome and patient is agreeable to  plan.   Alen Bleacher, MD [place]

## 2021-12-04 ENCOUNTER — Ambulatory Visit (INDEPENDENT_AMBULATORY_CARE_PROVIDER_SITE_OTHER): Payer: Medicare Other | Admitting: Student

## 2021-12-04 VITALS — BP 137/64 | HR 75 | Ht 60.0 in | Wt 149.0 lb

## 2021-12-04 DIAGNOSIS — D649 Anemia, unspecified: Secondary | ICD-10-CM

## 2021-12-04 DIAGNOSIS — I10 Essential (primary) hypertension: Secondary | ICD-10-CM

## 2021-12-04 DIAGNOSIS — D500 Iron deficiency anemia secondary to blood loss (chronic): Secondary | ICD-10-CM | POA: Diagnosis not present

## 2021-12-04 LAB — POCT HEMOGLOBIN: Hemoglobin: 8.4 g/dL — AB (ref 11–14.6)

## 2021-12-04 MED ORDER — FERROUS SULFATE 325 (65 FE) MG PO TABS: 325.0000 mg | ORAL_TABLET | ORAL | 2 refills | Status: DC

## 2021-12-04 NOTE — Patient Instructions (Addendum)
It was wonderful to meet you today. Thank you for allowing me to be a part of your care. Below is a short summary of what we discussed at your visit today:  Your hemoglobin today is 8.4 which is improved from last time you were here.    Suspect your low hemoglobin is possibly due to bleeding in your stomach.  I have placed a referral to see a gastroenterologist for colonoscopy.  I recommend starting oral iron supplements which you will take every other day.  Blood pressure today was normal. I noticed you have swellings in your leg I recommend getting over the counter compression socks should wear when you notice swelling in your legs.  Please bring all of your medications to every appointment!  If you have any questions or concerns, please do not hesitate to contact us via phone or MyChart message.   Alen Bleacher, MD [facility]

## 2021-12-05 ENCOUNTER — Ambulatory Visit: Payer: Medicare Other | Attending: Family Medicine

## 2021-12-05 DIAGNOSIS — R0789 Other chest pain: Secondary | ICD-10-CM

## 2021-12-05 LAB — EXERCISE TOLERANCE TEST
Angina Index: 0
Duke Treadmill Score: 0
Estimated workload: 1.9
Exercise duration (min): 0 min
Exercise duration (sec): 21 s
MPHR: 151 {beats}/min
Peak HR: 115 {beats}/min
Percent HR: 76 %
Rest HR: 75 {beats}/min
ST Depression (mm): 0 mm

## 2021-12-07 ENCOUNTER — Other Ambulatory Visit (HOSPITAL_COMMUNITY): Payer: Self-pay

## 2021-12-07 DIAGNOSIS — R1310 Dysphagia, unspecified: Secondary | ICD-10-CM

## 2021-12-10 ENCOUNTER — Ambulatory Visit: Payer: Self-pay

## 2021-12-12 ENCOUNTER — Ambulatory Visit (HOSPITAL_COMMUNITY): Admission: RE | Admit: 2021-12-12 | Payer: Medicare Other | Source: Ambulatory Visit

## 2021-12-12 ENCOUNTER — Ambulatory Visit (HOSPITAL_COMMUNITY)
Admission: RE | Admit: 2021-12-12 | Discharge: 2021-12-12 | Disposition: A | Discharge status: Home / Self Care | Payer: Medicare Other | Source: Ambulatory Visit | Attending: Neurology | Admitting: Neurology

## 2021-12-12 DIAGNOSIS — R1310 Dysphagia, unspecified: Secondary | ICD-10-CM

## 2021-12-14 ENCOUNTER — Encounter: Payer: Self-pay | Admitting: Family Medicine

## 2021-12-14 ENCOUNTER — Ambulatory Visit (INDEPENDENT_AMBULATORY_CARE_PROVIDER_SITE_OTHER): Payer: Medicare Other | Admitting: Family Medicine

## 2021-12-14 VITALS — BP 132/70 | HR 78 | Ht 60.0 in | Wt 145.2 lb

## 2021-12-14 DIAGNOSIS — R0789 Other chest pain: Secondary | ICD-10-CM | POA: Diagnosis not present

## 2021-12-14 DIAGNOSIS — Z0000 Encounter for general adult medical examination without abnormal findings: Secondary | ICD-10-CM | POA: Diagnosis not present

## 2021-12-14 DIAGNOSIS — D509 Iron deficiency anemia, unspecified: Secondary | ICD-10-CM | POA: Insufficient documentation

## 2021-12-14 DIAGNOSIS — R42 Dizziness and giddiness: Secondary | ICD-10-CM

## 2021-12-14 NOTE — Patient Instructions (Signed)
Patient declines AVS prior to leaving office.  No further work-up for her dizziness or chest tightness per patient. Continue iron via ferrous sulfate 325 mg daily.  Recheck hemoglobin 1 month. She is candidate for shingles and pneumococcal pneumonia vaccines.  Should obtain at pharmacy.  Ezequiel Essex, MD

## 2021-12-14 NOTE — Progress Notes (Signed)
    SUBJECTIVE:   CHIEF COMPLAINT / HPI:   Dizziness follow-up Patient today reports that she is no longer dizzy and is not sure what this follow-up is for.  She does not have any concerns about the dizziness.  She has been drinking more water lately, is up to about 2 bottles a day of water.  Chest tightness when walking Exercise stress test inconclusive given ability to walk far on the treadmill despite her saying the chest tightness comes on after walking for about a block.  Patient reports she no longer has the chest tightness and is not concerned at this time.  Iron deficiency anemia Previous labs collected last visit 8/30 show hemoglobin 7.8, MCV 96, iron 22, iron saturation 6%, ferritin 42.  I prescribed her ferrous sulfate 325 mg daily, but patient reports she never got it from her mail-in pharmacy.  PERTINENT  PMH / PSH: Severe alcohol use disorder, PACs, HTN, lumbar spinal stenosis, bilateral sensorineural hearing loss, uterine fibroids, left breast cancer, dysphagia, HLD  OBJECTIVE:   BP 132/70   Pulse 78   Ht 5' (1.524 m)   Wt 145 lb 3.2 oz (65.9 kg)   SpO2 99%   BMI 28.36 kg/m    PHQ-9:     12/14/2021    2:28 PM 12/04/2021    8:44 AM 10/17/2021    8:52 AM  Depression screen PHQ 2/9  Decreased Interest 0 0 0  Down, Depressed, Hopeless 0 0 0  PHQ - 2 Score 0 0 0  Altered sleeping 0 0 0  Tired, decreased energy 1 0 0  Change in appetite 1 0 3  Feeling bad or failure about yourself  0 0 0  Trouble concentrating '1 1 1  '$ Moving slowly or fidgety/restless 0 0 0  Suicidal thoughts 0 0 0  PHQ-9 Score '3 1 4  '$ Difficult doing work/chores Not difficult at all Not difficult at all Not difficult at all    Physical Exam General: Awake, alert, oriented Cardiovascular: Regular rate and rhythm, S1 and S2 present, no murmurs auscultated Respiratory: Lung fields clear to auscultation bilaterally  ASSESSMENT/PLAN:   Dizzy Now completely resolved for patient with increased  water intake.  Now drinking 2 bottles of water in a day.  Recommend she attempt to increase to at least 4 bottles in a day.  Chest tightness Patient went for exercise stress test but was unable to walk on the treadmill for long, so the test was inconclusive.  Today she reports that she longer has a chest tightness when walking and she does not want to pursue any further work-up.  Iron deficiency anemia Previously prescribed ferrous sulfate 325 mcg daily at last visit 8/30, patient has not initiated because she never got it from [facility].  I discussed that she does need this given her hemoglobin and iron levels, she reports she will call the pharmacy after this appointment.  Healthcare maintenance Discussed with patient that she is a candidate for both the shingles and pneumococcal pneumonia vaccines.  Given her insurance, she will need to pursue these at her pharmacy.  Patient voiced understanding, will call pharmacy today.     Ezequiel Essex, MD [facility]

## 2021-12-14 NOTE — Assessment & Plan Note (Signed)
Previously prescribed ferrous sulfate 325 mcg daily at last visit 8/30, patient has not initiated because she never got it from [facility].  I discussed that she does need this given her hemoglobin and iron levels, she reports she will call the pharmacy after this appointment.

## 2021-12-14 NOTE — Assessment & Plan Note (Signed)
Patient went for exercise stress test but was unable to walk on the treadmill for long, so the test was inconclusive.  Today she reports that she longer has a chest tightness when walking and she does not want to pursue any further work-up.

## 2021-12-14 NOTE — Assessment & Plan Note (Signed)
Now completely resolved for patient with increased water intake.  Now drinking 2 bottles of water in a day.  Recommend she attempt to increase to at least 4 bottles in a day.

## 2021-12-14 NOTE — Assessment & Plan Note (Signed)
Discussed with patient that she is a candidate for both the shingles and pneumococcal pneumonia vaccines.  Given her insurance, she will need to pursue these at her pharmacy.  Patient voiced understanding, will call pharmacy today.

## 2021-12-17 ENCOUNTER — Other Ambulatory Visit: Payer: Self-pay | Admitting: Student

## 2021-12-17 DIAGNOSIS — I10 Essential (primary) hypertension: Secondary | ICD-10-CM

## 2022-01-22 DIAGNOSIS — D649 Anemia, unspecified: Secondary | ICD-10-CM | POA: Diagnosis not present

## 2022-01-22 DIAGNOSIS — E611 Iron deficiency: Secondary | ICD-10-CM | POA: Diagnosis not present

## 2022-01-22 DIAGNOSIS — K2900 Acute gastritis without bleeding: Secondary | ICD-10-CM | POA: Diagnosis not present

## 2022-01-22 DIAGNOSIS — K5900 Constipation, unspecified: Secondary | ICD-10-CM | POA: Diagnosis not present

## 2022-01-23 ENCOUNTER — Telehealth: Payer: Self-pay | Admitting: Hematology and Oncology

## 2022-01-23 NOTE — Telephone Encounter (Signed)
Scheduled appt per 10/25 referral. Pt is aware of appt date and time. Pt is aware to arrive 15 mins prior to appt time and to bring and updated insurance card. Pt is aware of appt location.   

## 2022-02-11 ENCOUNTER — Other Ambulatory Visit: Payer: Self-pay

## 2022-02-11 ENCOUNTER — Inpatient Hospital Stay: Payer: Medicare Other | Attending: Hematology and Oncology | Admitting: Hematology and Oncology

## 2022-02-11 ENCOUNTER — Encounter: Payer: Self-pay | Admitting: Hematology and Oncology

## 2022-02-11 ENCOUNTER — Inpatient Hospital Stay: Payer: Medicare Other

## 2022-02-11 VITALS — BP 144/77 | HR 73 | Temp 97.6°F | Resp 16 | Ht 60.0 in | Wt 144.8 lb

## 2022-02-11 DIAGNOSIS — M1990 Unspecified osteoarthritis, unspecified site: Secondary | ICD-10-CM | POA: Insufficient documentation

## 2022-02-11 DIAGNOSIS — K760 Fatty (change of) liver, not elsewhere classified: Secondary | ICD-10-CM | POA: Diagnosis not present

## 2022-02-11 DIAGNOSIS — I10 Essential (primary) hypertension: Secondary | ICD-10-CM | POA: Insufficient documentation

## 2022-02-11 DIAGNOSIS — F1020 Alcohol dependence, uncomplicated: Secondary | ICD-10-CM | POA: Diagnosis not present

## 2022-02-11 DIAGNOSIS — Z853 Personal history of malignant neoplasm of breast: Secondary | ICD-10-CM | POA: Insufficient documentation

## 2022-02-11 DIAGNOSIS — K219 Gastro-esophageal reflux disease without esophagitis: Secondary | ICD-10-CM | POA: Insufficient documentation

## 2022-02-11 DIAGNOSIS — Z923 Personal history of irradiation: Secondary | ICD-10-CM | POA: Insufficient documentation

## 2022-02-11 DIAGNOSIS — D509 Iron deficiency anemia, unspecified: Secondary | ICD-10-CM | POA: Diagnosis not present

## 2022-02-11 DIAGNOSIS — G8929 Other chronic pain: Secondary | ICD-10-CM | POA: Insufficient documentation

## 2022-02-11 DIAGNOSIS — F1290 Cannabis use, unspecified, uncomplicated: Secondary | ICD-10-CM | POA: Diagnosis not present

## 2022-02-11 DIAGNOSIS — Z79899 Other long term (current) drug therapy: Secondary | ICD-10-CM | POA: Diagnosis not present

## 2022-02-11 DIAGNOSIS — G709 Myoneural disorder, unspecified: Secondary | ICD-10-CM | POA: Insufficient documentation

## 2022-02-11 NOTE — Progress Notes (Signed)
[place] CONSULT NOTE  Patient Care Team: Precious Gilding, DO as PCP - General (Family Medicine) [name], MD as Consulting Physician (Orthopedic Surgery)  CHIEF COMPLAINTS/PURPOSE OF CONSULTATION:  IDA  ASSESSMENT & PLAN:   This is a very pleasant 71 year old female patient with past medical history significant for right breast cancer, arthritis, alcoholism referred to hematology for recommendations regarding iron deficiency anemia.  Patient arrived to the appointment today by herself.  She appears to be confused about the fact that she has been taking iron.  During the initial part of the conversation, she tells me that she has been taking it for about 1 to 2 weeks and then when we actually reviewed the medication list, she tells me that she is not taking anything but the 4 medications which comprises of rosuvastatin/gabapentin/amlodipine and PPI.  Physical examination today, no findings noted.  She appears mentally a bit slow. We have today discussed about treatment recommendations for iron replacement which include both oral and IV infusion.  She was not amenable to the idea of IV infusion, she hates the idea of sitting here 30 minutes every infusion even if it is for couple times.  She would rather want to try the pill but again I get the sense that she is probably not can take it but is saying yes so she can be done with the appointment.  She appeared to be in a rush and asked several times if she can leave now.  She agreed to at least try the medication and return to clinic in 3 months.  Have strongly encouraged her to start taking it.  She understands that if she continues to become anemic, she will eventually feel symptomatic. She is also not agreeable to talk about her alcohol consumption, she understands that it is an old habit but she is not willing to stop drinking. She appears to have no active evidence of GI bleed, she already has an appointment with her  gastroenterologist, recently underwent endoscopy and colonoscopy. Rest of the pertinent 10 point ROS reviewed and negative.  Thank you for consulting [place] care of this patient.  Please do not hesitate to contact us with any additional questions or concerns.  HISTORY OF PRESENTING ILLNESS:  Carrie [name] 71 y.o. female is here because of iron deficiency anemia.  This is a 71 year old female patient with past medical history significant for right breast cancer status postlumpectomy, radiation and adjuvant antiestrogens, alcoholism referred to hematology for evaluation and recommendations regarding iron deficiency anemia.  Patient arrived to the appointment today by herself.  She initially told me that she started taking iron about 2 weeks ago or maybe a week ago however upon review of her medication list, tells me that she has not been taking anything except her for medications which are compiled of rosuvastatin/gabapentin/amlodipine and PPI. She tells me that her PCP has prescribed the medication to her but she never got it and she has not really taken it.  She overall feels a bit confused about the fact that she has not been taking her has been taking iron.  She continues to drink heavily, she prefers to drink all day if she can afford it.  She drinks about half a pint of brandy and she has no desire to even talk about alcohol cessation.  She understands its not the best for her.  She lives with a friend.  She was in a bit of rush to be done with appointment, she  tells me that she does not want to ever come back into the cancer center if that is a possibility, she came here many years ago with breast cancer.  She does not want to try IV iron, she tells me that she has no time to sit here for 30 minutes infusions.  She tells me that she feels great.  No blood in her stool.  No melena.  Rest of the pertinent 10 point ROS reviewed and negative.  MEDICAL HISTORY:  Past Medical History:  Diagnosis Date   Arthritis     "back, right hand" (07/11/2014)   Breast cancer ([place]) 2011   right breast   Breast cancer, right breast ([place]) 05/2009   Chronic lower back pain    GERD (gastroesophageal reflux disease)    Hepatic steatosis 07/17/2011   Hypertension    Hypokalemia 07/17/2011   Lower GI bleeding 07/11/2014 hospitalized   Neuromuscular disorder ([facility])    numbness in legs and feet   Personal history of radiation therapy 2011    SURGICAL HISTORY: Past Surgical History:  Procedure Laterality Date   BIOPSY  11/13/2021   Procedure: BIOPSY;  Surgeon: Ronnette Juniper, MD;  Location: [facility] ENDOSCOPY;  Service: Gastroenterology;;   BREAST BIOPSY Right 2011   BREAST LUMPECTOMY Right 2011   COLONOSCOPY     ESOPHAGOGASTRODUODENOSCOPY N/A 07/12/2014   Procedure: ESOPHAGOGASTRODUODENOSCOPY (EGD);  Surgeon: Clarene Essex, MD;  Location: [facility] ENDOSCOPY;  Service: Endoscopy;  Laterality: N/A;   ESOPHAGOGASTRODUODENOSCOPY (EGD) WITH PROPOFOL N/A 11/13/2021   Procedure: ESOPHAGOGASTRODUODENOSCOPY (EGD) WITH PROPOFOL;  Surgeon: Ronnette Juniper, MD;  Location: [facility] ENDOSCOPY;  Service: Gastroenterology;  Laterality: N/A;   FINGER SURGERY     middle finger on left hand   HYSTERECTOMY ABDOMINAL WITH SALPINGO-OOPHORECTOMY Bilateral 05/11/2020   Procedure: TOTAL ABDOMINAL HYSTERECTOMY WITH BILATERAL SALPINGO-OOPHORECTOMY;  Surgeon: Molli Posey, MD;  Location: [facility] ORS;  Service: Gynecology;  Laterality: Bilateral;  NEEDS BED   MASTECTOMY COMPLETE / SIMPLE W/ SENTINEL NODE BIOPSY  05/2009   Archie Endo 05/23/2009   TOTAL KNEE ARTHROPLASTY Right 10/23/2016   TOTAL KNEE ARTHROPLASTY Right 10/23/2016   Procedure: RIGHT TOTAL KNEE ARTHROPLASTY;  Surgeon: [name], MD;  Location: [facility];  Service: Orthopedics;  Laterality: Right;    SOCIAL HISTORY: Social History   Socioeconomic History   Marital status: Single    Spouse name: Not on file   Number of children: 0   Years of education: 11   Highest education level: 11th grade  Occupational  History   Occupation: retired    Comment: housekeeping  Tobacco Use   Smoking status: Never   Smokeless tobacco: Never  Vaping Use   Vaping Use: Never used  Substance and Sexual Activity   Alcohol use: Yes    Alcohol/week: 14.0 standard drinks of alcohol    Types: 10 Glasses of wine, 4 Shots of liquor per week    Comment: daily since her 71s several a day    Drug use: Yes    Frequency: 7.0 times per week    Types: Marijuana   Sexual activity: Yes    Birth control/protection: Post-menopausal, Condom    Comment: 2 partners in last year without condom use  Other Topics Concern   Not on file  Social History Narrative   Current Social History 12/31/2016        Who lives at home: Patient lives alone in one level home; has stairs at front door; has hand rail; no throw rugs; has grab bars in bathroom  Transportation: Patient relies on friend to drive her    Important Relationships: Orvil Feil (friend) and Evelina Bucy (sister)    Does not like to cook, hard to cook for one person, has a small appetite, does not like microwave meals   Pets: None    Education / Work:  49 th grade/ None       Interests / Fun: no hobbies, watches some TV   Current Stressors: None Religious / Personal Beliefs:  Does not attend church                     Social Determinants of Health   Financial Resource Strain: Low Risk  (11/06/2020)   Overall Financial Resource Strain (CARDIA)    Difficulty of Paying Living Expenses: Not hard at all  Food Insecurity: No Food Insecurity (11/06/2020)   Hunger Vital Sign    Worried About Running Out of Food in the Last Year: Never true    [place] in the Last Year: Never true  Transportation Needs: No Transportation Needs (11/06/2020)   PRAPARE - Hydrologist (Medical): No    Lack of Transportation (Non-Medical): No  Physical Activity: Inactive (11/06/2020)   Exercise Vital Sign    Days of Exercise per Week: 0 days    Minutes of  Exercise per Session: 0 min  Stress: No Stress Concern Present (11/06/2020)   [place]    Feeling of Stress : Not at all  Social Connections: Moderately Isolated (11/06/2020)   Social Connection and Isolation Panel [NHANES]    Frequency of Communication with Friends and Family: More than three times a week    Frequency of Social Gatherings with Friends and Family: Twice a week    Attends Religious Services: Never    Marine scientist or Organizations: No    Attends Archivist Meetings: Never    Marital Status: Living with partner  Intimate Partner Violence: Not At Risk (11/06/2020)   Humiliation, Afraid, Rape, and Kick questionnaire    Fear of Current or Ex-Partner: No    Emotionally Abused: No    Physically Abused: No    Sexually Abused: No    FAMILY HISTORY: Family History  Problem Relation Age of Onset   Cancer Other    Diabetes Mother    Hypertension Mother    Diabetes Brother    Hypertension Father    Arthritis/Rheumatoid Father    Cancer Father    Cancer Maternal Grandfather    Heart disease Brother     ALLERGIES:  is allergic to aspirin.  MEDICATIONS:  Current Outpatient Medications  Medication Sig Dispense Refill   amLODipine (NORVASC) 10 MG tablet Take 1 tablet (10 mg total) by mouth daily. 30 tablet 0   ferrous sulfate (FERROUSUL) 325 (65 FE) MG tablet Take 1 tablet (325 mg total) by mouth every other day. 15 tablet 2   folic acid (FOLVITE) 1 MG tablet Take 1 tablet (1 mg total) by mouth daily. 90 tablet 1   gabapentin (NEURONTIN) 100 MG capsule Take 2 capsules (200 mg total) by mouth 3 (three) times daily. (Patient taking differently: Take 100 mg by mouth daily.) 180 capsule 0   ibuprofen (ADVIL) 200 MG tablet Take 4 tablets (800 mg total) by mouth every 8 (eight) hours as needed for moderate pain. (Patient not taking: Reported on 11/07/2021) 30 tablet 0   NASAL SPRAY SALINE NA Place  1  spray into the nose daily as needed (Nasal congestion).     rosuvastatin (CRESTOR) 20 MG tablet Take 1 tablet (20 mg total) by mouth daily. 90 tablet 3   thiamine 100 MG tablet Take 1 tablet (100 mg total) by mouth daily. 90 tablet 1   No current facility-administered medications for this visit.     PHYSICAL EXAMINATION: ECOG PERFORMANCE STATUS: 0 - Asymptomatic  Vitals:   02/11/22 1008  BP: (!) 144/77  Pulse: 73  Resp: 16  Temp: 97.6 F (36.4 C)  SpO2: 100%   Filed Weights   02/11/22 1008  Weight: 144 lb 12.8 oz (65.7 kg)    Physical Exam Constitutional:      Appearance: Normal appearance.  Cardiovascular:     Rate and Rhythm: Normal rate and regular rhythm.  Pulmonary:     Effort: Pulmonary effort is normal.     Breath sounds: Normal breath sounds.  Abdominal:     General: Abdomen is flat. Bowel sounds are normal. There is no distension.     Tenderness: There is no abdominal tenderness.  Musculoskeletal:     Cervical back: Normal range of motion and neck supple. No rigidity.  Lymphadenopathy:     Cervical: No cervical adenopathy.  Skin:    General: Skin is warm and dry.  Neurological:     General: No focal deficit present.     Mental Status: She is alert.      LABORATORY DATA:  I have reviewed the data as listed Lab Results  Component Value Date   WBC 8.0 11/28/2021   HGB 8.4 (A) 12/04/2021   HCT 24.1 (L) 11/28/2021   MCV 96 11/28/2021   PLT 534 (H) 11/28/2021     Chemistry      Component Value Date/Time   NA 138 11/28/2021 1004   NA 140 08/02/2014 0924   K 3.9 11/28/2021 1004   K 3.4 (L) 08/02/2014 0924   CL 98 11/28/2021 1004   CL 98 07/14/2012 1400   CO2 23 11/28/2021 1004   CO2 28 08/02/2014 0924   BUN 10 11/28/2021 1004   BUN 6.4 (L) 08/02/2014 0924   CREATININE 0.70 11/28/2021 1004   CREATININE 0.72 06/07/2016 0924   CREATININE 0.8 08/02/2014 0924      Component Value Date/Time   CALCIUM 10.1 11/28/2021 1004   CALCIUM 9.1  08/02/2014 0924   ALKPHOS 44 11/28/2021 1004   ALKPHOS 70 08/02/2014 0924   AST 28 11/28/2021 1004   AST 13 08/02/2014 0924   ALT 15 11/28/2021 1004   ALT 12 08/02/2014 0924   BILITOT 0.2 11/28/2021 1004   BILITOT 0.25 08/02/2014 0924       RADIOGRAPHIC STUDIES: I have personally reviewed the radiological images as listed and agreed with the findings in the report. No results found.  All questions were answered. The patient knows to call the clinic with any problems, questions or concerns. I spent 45 minutes in the care of this patient including H and P, review of records, counseling and coordination of care.     Carrie Pike, MD 02/11/2022 10:30 AM

## 2022-04-19 ENCOUNTER — Other Ambulatory Visit: Payer: Self-pay | Admitting: Student

## 2022-04-19 DIAGNOSIS — I10 Essential (primary) hypertension: Secondary | ICD-10-CM

## 2022-05-03 ENCOUNTER — Telehealth: Payer: Self-pay | Admitting: Student

## 2022-05-03 NOTE — Telephone Encounter (Signed)
I left a message on patient's voice mail reminder her of her appointment for [facility] on 05/06/22 at 10:00.

## 2022-05-06 ENCOUNTER — Ambulatory Visit (INDEPENDENT_AMBULATORY_CARE_PROVIDER_SITE_OTHER): Payer: Medicare HMO

## 2022-05-06 DIAGNOSIS — Z0000 Encounter for general adult medical examination without abnormal findings: Secondary | ICD-10-CM

## 2022-05-06 NOTE — Patient Instructions (Signed)

## 2022-05-06 NOTE — Progress Notes (Signed)
I connected with  Carrie [name] on 05/06/22 by a audio enabled telemedicine application and verified that I am speaking with the correct person using two identifiers.  Patient Location: Home  Provider Location: Office/Clinic  I discussed the limitations of evaluation and management by telemedicine. The patient expressed understanding and agreed to proceed.   Subjective:   Carrie [name] is a 72 y.o. female who presents for Medicare Annual (Subsequent) preventive examination.  Review of Systems    Per HPI unless specifically indicated below.  Cardiac Risk Factors include: advanced age (>83mn, >>8women);female gender, essential hypertension, and hypercholesterolemia.           Objective:       02/11/2022   10:08 AM 12/14/2021    2:24 PM 12/04/2021    9:36 AM  Vitals with BMI  Height '5\' 0"'$  '5\' 0"'$    Weight 144 lbs 13 oz 145 lbs 3 oz   BMI 209.32267.12  Systolic [id number] Diastolic 77 70 64  Pulse 73 78     There were no vitals filed for this visit. There is no height or weight on file to calculate BMI.     05/06/2022   10:26 AM 12/14/2021    2:30 PM 12/04/2021    8:44 AM 11/23/2021    8:38 AM 10/17/2021    8:53 AM 07/03/2021    8:26 AM 11/06/2020    1:19 PM  Advanced Directives  Does Patient Have a Medical Advance Directive? No No No No No No No  Would patient like information on creating a medical advance directive? No - Patient declined   Yes (MAU/Ambulatory/Procedural Areas - Information given) No - Patient declined No - Patient declined No - Patient declined    Current Medications (verified) Outpatient Encounter Medications as of 05/06/2022  Medication Sig   amLODipine (NORVASC) 10 MG tablet Take 1 tablet (10 mg total) by mouth daily.   folic acid (FOLVITE) 1 MG tablet Take 1 tablet (1 mg total) by mouth daily.   gabapentin (NEURONTIN) 100 MG capsule Take 2 capsules (200 mg total) by mouth 3 (three) times daily. (Patient taking differently: Take 100 mg by mouth daily.)    pantoprazole (PROTONIX) 40 MG tablet Take 40 mg by mouth as needed.   rosuvastatin (CRESTOR) 20 MG tablet Take 1 tablet (20 mg total) by mouth daily.   ferrous sulfate (FERROUSUL) 325 (65 FE) MG tablet Take 1 tablet (325 mg total) by mouth every other day.   NASAL SPRAY SALINE NA Place 1 spray into the nose daily as needed (Nasal congestion). (Patient not taking: Reported on 05/06/2022)   thiamine 100 MG tablet Take 1 tablet (100 mg total) by mouth daily. (Patient not taking: Reported on 05/06/2022)   [DISCONTINUED] ibuprofen (ADVIL) 200 MG tablet Take 4 tablets (800 mg total) by mouth every 8 (eight) hours as needed for moderate pain. (Patient not taking: Reported on 11/07/2021)   No facility-administered encounter medications on file as of 05/06/2022.    Allergies (verified) Aspirin   History: Past Medical History:  Diagnosis Date   Arthritis    "back, right hand" (07/11/2014)   Breast cancer ([facility] 2011   right breast   Breast cancer, right breast ([facility] 05[place]2009   Chronic lower back pain    GERD (gastroesophageal reflux disease)    Hepatic steatosis 07/17/2011   Hypertension    Hypokalemia 07/17/2011   Lower GI bleeding 07/11/2014 hospitalized   Neuromuscular disorder ([facility]  numbness in legs and feet   Personal history of radiation therapy 2011   Past Surgical History:  Procedure Laterality Date   BIOPSY  11/13/2021   Procedure: BIOPSY;  Surgeon: Ronnette Juniper, MD;  Location: [facility];  Service: Gastroenterology;;   BREAST BIOPSY Right 2011   BREAST LUMPECTOMY Right 2011   COLONOSCOPY     ESOPHAGOGASTRODUODENOSCOPY N/A 07/12/2014   Procedure: ESOPHAGOGASTRODUODENOSCOPY (EGD);  Surgeon: Clarene Essex, MD;  Location: [facility] ENDOSCOPY;  Service: Endoscopy;  Laterality: N/A;   ESOPHAGOGASTRODUODENOSCOPY (EGD) WITH PROPOFOL N/A 11/13/2021   Procedure: ESOPHAGOGASTRODUODENOSCOPY (EGD) WITH PROPOFOL;  Surgeon: Ronnette Juniper, MD;  Location: [facility] ENDOSCOPY;  Service: Gastroenterology;  Laterality: N/A;    FINGER SURGERY     middle finger on left hand   HYSTERECTOMY ABDOMINAL WITH SALPINGO-OOPHORECTOMY Bilateral 05/11/2020   Procedure: TOTAL ABDOMINAL HYSTERECTOMY WITH BILATERAL SALPINGO-OOPHORECTOMY;  Surgeon: Molli Posey, MD;  Location: [facility];  Service: Gynecology;  Laterality: Bilateral;  NEEDS BED   MASTECTOMY COMPLETE / SIMPLE W/ SENTINEL NODE BIOPSY  05/2009   Archie Endo 05/23/2009   TOTAL KNEE ARTHROPLASTY Right 10/23/2016   TOTAL KNEE ARTHROPLASTY Right 10/23/2016   Procedure: RIGHT TOTAL KNEE ARTHROPLASTY;  Surgeon: [name], MD;  Location: [place];  Service: Orthopedics;  Laterality: Right;   Family History  Problem Relation Age of Onset   Cancer Other    Diabetes Mother    Hypertension Mother    Diabetes Brother    Hypertension Father    Arthritis/Rheumatoid Father    Cancer Father    Cancer Maternal Grandfather    Heart disease Brother    Social History   Socioeconomic History   Marital status: Single    Spouse name: Not on file   Number of children: 0   Years of education: 11   Highest education level: 11th grade  Occupational History   Occupation: retired    Comment: housekeeping  Tobacco Use   Smoking status: Never   Smokeless tobacco: Never  Vaping Use   Vaping Use: Never used  Substance and Sexual Activity   Alcohol use: Yes    Alcohol/week: 14.0 standard drinks of alcohol    Types: 10 Glasses of wine, 4 Shots of liquor per week    Comment: daily since her 39s several a day    Drug use: Yes    Frequency: 7.0 times per week    Types: Marijuana   Sexual activity: Yes    Birth control/protection: Post-menopausal, Condom    Comment: 2 partners in last year without condom use  Other Topics Concern   Not on file  Social History Narrative   Current Social History 12/31/2016        Who lives at home: Patient lives alone in one level home; has stairs at front door; has hand rail; no throw rugs; has grab bars in bathroom   Transportation: Patient relies on  friend to drive her    Important Relationships: Orvil Feil (friend) and Evelina Bucy (sister)    Does not like to cook, hard to cook for one person, has a small appetite, does not like microwave meals   Pets: None    Education / Work:  60 th grade/ None       Interests / Fun: no hobbies, watches some TV   Current Stressors: None Religious / Personal Beliefs:  Does not attend church                     Social Determinants of  Health   Financial Resource Strain: Low Risk  (05/06/2022)   Overall Financial Resource Strain (CARDIA)    Difficulty of Paying Living Expenses: Not hard at all  Food Insecurity: No Food Insecurity (05/06/2022)   Hunger Vital Sign    Worried About Running Out of Food in the Last Year: Never true    Ran Out of Food in the Last Year: Never true  Transportation Needs: No Transportation Needs (05/06/2022)   PRAPARE - Hydrologist (Medical): No    Lack of Transportation (Non-Medical): No  Physical Activity: Inactive (05/06/2022)   Exercise Vital Sign    Days of Exercise per Week: 0 days    Minutes of Exercise per Session: 0 min  Stress: Stress Concern Present (05/06/2022)   [place]    Feeling of Stress : Rather much  Social Connections: Socially Isolated (05/06/2022)   Social Connection and Isolation Panel [NHANES]    Frequency of Communication with Friends and Family: More than three times a week    Frequency of Social Gatherings with Friends and Family: More than three times a week    Attends Religious Services: Never    Marine scientist or Organizations: No    Attends Music therapist: Never    Marital Status: Never married    Tobacco Counseling Counseling given: Not Answered   Clinical Intake:     Pain : No/denies pain     Nutritional Status: BMI 25 -29 Overweight Nutritional Risks: Unintentional weight loss Diabetes: No  How often do you  need to have someone help you when you read instructions, pamphlets, or other written materials from your doctor or pharmacy?: 1 - Never  Diabetic?No     Information entered by :: Donnie Mesa, CMA   Activities of Daily Living    05/06/2022   10:20 AM  In your present state of health, do you have any difficulty performing the following activities:  Hearing? 1  Comment bilateral  Vision? 1  Comment overdue eye exam  Difficulty concentrating or making decisions? 0  Walking or climbing stairs? 1  Dressing or bathing? 0  Doing errands, shopping? 0    Patient Care Team: Precious Gilding, DO as PCP - General (Family Medicine) [name], MD as Consulting Physician (Orthopedic Surgery)  Indicate any recent Medical Services you may have received from other than [facility] providers in the past year (date may be approximate).    Assessment:   This is a routine wellness examination for Aiana.  Hearing/Vision screen Admits to bilateral hearing loss. Admits to vision changes. Overdue for an Eye Exam.  Dietary issues and exercise activities discussed: Current Exercise Habits: The patient does not participate in regular exercise at present, Exercise limited by: None identified   Goals Addressed   None    Depression Screen    05/06/2022   10:16 AM 12/14/2021    2:28 PM 12/04/2021    8:44 AM 10/17/2021    8:52 AM 09/19/2021    1:59 PM 07/03/2021    8:25 AM 05/18/2021    2:42 PM  PHQ 2/9 Scores  PHQ - 2 Score 0 0 0 0 0 0 0  PHQ- 9 Score  '3 1 4 7 5     '$ Fall Risk    05/06/2022   10:19 AM 12/04/2021    8:45 AM 10/17/2021    8:52 AM 09/19/2021    1:59 PM 07/03/2021  8:25 AM  Fall Risk   Falls in the past year? '1 1 1 1 '$ 0  Number falls in past yr: 1 0 1 1 0  Injury with Fall? 0 0 0 0 0  Risk for fall due to : Impaired mobility;Other (Comment)      Risk for fall due to: Comment drinking      Follow up Falls evaluation completed Falls evaluation completed  Falls evaluation completed      [place]:  Any stairs in or around the home? No  If so, are there any without handrails? No  Home free of loose throw rugs in walkways, pet beds, electrical cords, etc? No  Adequate lighting in your home to reduce risk of falls? Yes   ASSISTIVE DEVICES UTILIZED TO PREVENT FALLS:  Life alert? No  Use of a cane, walker or w/c? Yes  Grab bars in the bathroom? Yes  Shower chair or bench in shower? Yes  Elevated toilet seat or a handicapped toilet? No   TIMED UP AND GO:  Was the test performed?  unable to perform, virtual appt .    Cognitive Function:    11/06/2020    1:37 PM 03/16/2018   10:14 AM  MMSE - Mini Mental State Exam  Not completed: Unable to complete   Orientation to time  5  Orientation to Place  5  Registration  3  Attention/ Calculation  5  Recall  3  Language- name 2 objects  2  Language- repeat  1  Language- follow 3 step command  3  Language- read & follow direction  1  Write a sentence  1  Copy design  1  Total score  30        05/06/2022   10:21 AM 11/06/2020    1:33 PM 03/16/2018   10:16 AM  6CIT Screen  What Year? 0 points 0 points 0 points  What month? 0 points 0 points 0 points  What time? 0 points 3 points 0 points  Count back from 20 4 points 4 points 0 points  Months in reverse 4 points 2 points 0 points  Repeat phrase 10 points 4 points 0 points  Total Score 18 points 13 points 0 points    Immunizations Immunization History  Administered Date(s) Administered   Fluad Quad(high Dose 65+) 02/23/2019   Influenza,inj,Quad PF,6+ Mos 03/15/2014, 02/09/2015, 12/31/2016, 03/16/2018   PFIZER(Purple Top)SARS-COV-2 Vaccination 06/10/2019, 07/05/2019, 02/16/2020   Pneumococcal Polysaccharide-23 07/13/2014   Td 02/05/1999   Tdap 03/15/2014    TDAP status: Up to date  Flu Vaccine status: Due, Education has been provided regarding the importance of this vaccine. Advised may receive this vaccine at local  pharmacy or Health Dept. Aware to provide a copy of the vaccination record if obtained from local pharmacy or Health Dept. Verbalized acceptance and understanding.  Pneumococcal vaccine status: Due, Education has been provided regarding the importance of this vaccine. Advised may receive this vaccine at local pharmacy or Health Dept. Aware to provide a copy of the vaccination record if obtained from local pharmacy or Health Dept. Verbalized acceptance and understanding.  Covid-19 vaccine status: Information provided on how to obtain vaccines.   Qualifies for Shingles Vaccine? Yes   Zostavax completed No   Shingrix Completed?: No.    Education has been provided regarding the importance of this vaccine. Patient has been advised to call insurance company to determine out of pocket expense if they have not yet  received this vaccine. Advised may also receive vaccine at local pharmacy or Health Dept. Verbalized acceptance and understanding.  Screening Tests Health Maintenance  Topic Date Due   Zoster Vaccines- Shingrix (1 of 2) Never done   COVID-19 Vaccine (4 - 2023-24 season) 11/30/2021   INFLUENZA VACCINE  06/30/2022 (Originally 10/30/2021)   Pneumonia Vaccine 63+ Years old (2 - PCV) 05/07/2023 (Originally 07/13/2015)   COLONOSCOPY (Pts 45-50yr Insurance coverage will need to be confirmed)  08/28/2022   Medicare Annual Wellness (AWV)  05/07/2023   MAMMOGRAM  11/02/2023   DTaP/Tdap/Td (3 - Td or Tdap) 03/15/2024   DEXA SCAN  Completed   Hepatitis C Screening  Completed   HPV VACCINES  Aged Out    Health Maintenance  Health Maintenance Due  Topic Date Due   Zoster Vaccines- Shingrix (1 of 2) Never done   COVID-19 Vaccine (4 - 2023-24 season) 11/30/2021    Colorectal cancer screening: Type of screening: Colonoscopy. Completed 08/27/2012. Repeat every 10 years  Mammogram status: Completed 11/01/2021. Repeat every year  DEXA Scan: completed 07/06/2012  Lung Cancer Screening: (Low Dose CT  Chest recommended if Age 70-80years, 30 pack-year currently smoking OR have quit w/in 15years.) does not qualify.   Lung Cancer Screening Referral: not applicable   Additional Screening:  Hepatitis C Screening: does qualify; Completed 07/23/2013  Vision Screening: Recommended annual ophthalmology exams for early detection of glaucoma and other disorders of the eye. Is the patient up to date with their annual eye exam?  No  Who is the provider or what is the name of the office in which the patient attends annual eye exams?  If pt is not established with a provider, would they like to be referred to a provider to establish care? No .   Dental Screening: Recommended annual dental exams for proper oral hygiene  Community Resource Referral / Chronic Care Management: CRR required this visit?  No   CCM required this visit?  No      Plan:     I have personally reviewed and noted the following in the patient's chart:   Medical and social history Use of alcohol, tobacco or illicit drugs  Current medications and supplements including opioid prescriptions. Patient is not currently taking opioid prescriptions. Functional ability and status Nutritional status Physical activity Advanced directives List of other physicians Hospitalizations, surgeries, and ER visits in previous 12 months Vitals Screenings to include cognitive, depression, and falls Referrals and appointments  In addition, I have reviewed and discussed with patient certain preventive protocols, quality metrics, and best practice recommendations. A written personalized care plan for preventive services as well as general preventive health recommendations were provided to patient.    Ms. AMccarey, Thank you for taking time to come for your Medicare Wellness Visit. I appreciate your ongoing commitment to your health goals. Please review the following plan we discussed and let me know if I can assist you in the future.   These  are the goals we discussed:  Goals      Patient Stated     "Stop hurting". Knee pain, and back pain.     Weight (lb) < 178 lb (80.7 kg)     7% weight loss         This is a list of the screening recommended for you and due dates:  Health Maintenance  Topic Date Due   Zoster (Shingles) Vaccine (1 of 2) Never done   COVID-19 Vaccine (4 - 2023-24 season)  11/30/2021   Flu Shot  06/30/2022*   Pneumonia Vaccine (2 - PCV) 05/07/2023*   Colon Cancer Screening  08/28/2022   Medicare Annual Wellness Visit  05/07/2023   Mammogram  11/02/2023   DTaP/Tdap/Td vaccine (3 - Td or Tdap) 03/15/2024   DEXA scan (bone density measurement)  Completed   Hepatitis C Screening: USPSTF Recommendation to screen - Ages 70-79 yo.  Completed   HPV Vaccine  Aged Out  *Topic was postponed. The date shown is not the original due date.     Wilson Singer, [place]   05/06/2022   Nurse Notes: Approximately 30 minute Non-Face -To-Face Medicare Wellness Visit

## 2022-05-14 ENCOUNTER — Inpatient Hospital Stay: Payer: Medicaid Other

## 2022-05-14 ENCOUNTER — Inpatient Hospital Stay: Payer: Medicaid Other | Admitting: Hematology and Oncology

## 2022-05-22 ENCOUNTER — Other Ambulatory Visit: Payer: Self-pay

## 2022-05-22 DIAGNOSIS — D509 Iron deficiency anemia, unspecified: Secondary | ICD-10-CM

## 2022-05-24 ENCOUNTER — Inpatient Hospital Stay: Payer: Medicare HMO | Attending: Hematology and Oncology

## 2022-05-24 ENCOUNTER — Inpatient Hospital Stay: Payer: Medicare HMO | Admitting: Hematology and Oncology

## 2022-06-17 ENCOUNTER — Ambulatory Visit (HOSPITAL_COMMUNITY)
Admission: EM | Admit: 2022-06-17 | Discharge: 2022-06-17 | Disposition: A | Discharge status: Home / Self Care | Payer: Medicare HMO | Attending: Family Medicine | Admitting: Family Medicine

## 2022-06-17 ENCOUNTER — Encounter (HOSPITAL_COMMUNITY): Payer: Self-pay

## 2022-06-17 DIAGNOSIS — R21 Rash and other nonspecific skin eruption: Secondary | ICD-10-CM | POA: Diagnosis not present

## 2022-06-17 DIAGNOSIS — J0110 Acute frontal sinusitis, unspecified: Secondary | ICD-10-CM

## 2022-06-17 MED ORDER — AMOXICILLIN-POT CLAVULANATE 875-125 MG PO TABS: 1.0000 | ORAL_TABLET | Freq: Two times a day (BID) | ORAL | 0 refills | Status: DC

## 2022-06-17 MED ORDER — KETOROLAC TROMETHAMINE 30 MG/ML IJ SOLN
INTRAMUSCULAR | Status: AC
  Filled 2022-06-17: qty 1

## 2022-06-17 MED ORDER — VALACYCLOVIR HCL 1 G PO TABS: 1000.0000 mg | ORAL_TABLET | Freq: Three times a day (TID) | ORAL | 0 refills | Status: AC

## 2022-06-17 MED ORDER — KETOROLAC TROMETHAMINE 30 MG/ML IJ SOLN
30.0000 mg | Freq: Once | INTRAMUSCULAR | Status: AC
  Administered 2022-06-17: 30 mg via INTRAMUSCULAR

## 2022-06-17 MED ORDER — FLUTICASONE PROPIONATE 50 MCG/ACT NA SUSP: 1.0000 | Freq: Every day | NASAL | 0 refills | Status: AC

## 2022-06-17 NOTE — Discharge Instructions (Addendum)
Please start taking the antibiotic as prescribed twice daily with food. Please take 1000 mg of Tylenol up to 3 times daily as needed for the discomfort. Additionally, I have prescribed a nasal spray for you to use to help with the congestion. Please start taking Valtrex 3 times daily for the next 7 days. If your symptoms persist or worsen, please head to the emergency room. Call PCP and schedule follow up in 2-3 days.

## 2022-06-17 NOTE — ED Provider Notes (Signed)
[place]    CSN: [id number] Arrival date & time: 06/17/22  1100      History   Chief Complaint Chief Complaint  Patient presents with   Facial Pain    HPI Carrie [name] is a 72 y.o. female.   Pleasant 72 year old female presents today due to concerns of left-sided facial pain.  She reports tenderness to her frontal sinus.  She states that her frontal head is tender to touch, extending into her scalp.  She states it has been bothering her for the past week.  She states increased in discomfort this morning.  She reports the sensation of pressure behind her eye, but denies any decrease in ocular movements, or any change in vision.  She denies photophobia, scotomas, or blind spots.  She denies any headache apart from her sinus pain. She reports pain in the lymph nodes under her jaw and on the L side of her neck, with pain worsening with rotation to the right, but no pain with rotation to the left. She denies a fever. She has not tried any OTC meds for her symptoms. She reports a burning sensation inside her nose but denies any nasal drainage. There is a rash on the L side of her nose, pt states she was unaware it was there and does not know when it started. Denies cough, SOB, swelling of eyelid, sore throat, or any additional symptoms.     Past Medical History:  Diagnosis Date   Arthritis    "back, right hand" (07/11/2014)   Breast cancer ([place]) 2011   right breast   Breast cancer, right breast ([place]) 05/2009   Chronic lower back pain    GERD (gastroesophageal reflux disease)    Hepatic steatosis 07/17/2011   Hypertension    Hypokalemia 07/17/2011   Lower GI bleeding 07/11/2014 hospitalized   Neuromuscular disorder (HCC)    numbness in legs and feet   Personal history of radiation therapy 2011    Patient Active Problem List   Diagnosis Date Noted   Iron deficiency anemia 12/14/2021   Chest tightness 11/28/2021   Dizzy 11/28/2021   Right lower quadrant abdominal pain  10/17/2021   Hypercholesterolemia 10/17/2021   Premature atrial contractions 09/19/2021   Cough 09/19/2021   Dysphagia 07/03/2021   Weight loss 09/18/2020   Unilateral primary osteoarthritis, left knee 07/12/2020   Ovarian cyst 05/11/2020   Balance problem 11/30/2019   Lipoma of face 09/06/2018   Neuropathy 04/17/2017   Bilateral sensorineural hearing loss 07/18/2016   Spinal stenosis of lumbar region 02/14/2016   Healthcare maintenance 03/15/2014   Essential hypertension, benign 07/23/2013   GERD (gastroesophageal reflux disease) 07/23/2013   Breast cancer, left breast ([place]) 07/17/2011   FIBROIDS, UTERUS 01/30/2007   Alcohol use disorder, severe, dependence ([place]) 01/30/2007    Past Surgical History:  Procedure Laterality Date   BIOPSY  11/13/2021   Procedure: BIOPSY;  Surgeon: Ronnette Juniper, MD;  Location: [facility];  Service: Gastroenterology;;   BREAST BIOPSY Right 2011   BREAST LUMPECTOMY Right 2011   COLONOSCOPY     ESOPHAGOGASTRODUODENOSCOPY N/A 07/12/2014   Procedure: ESOPHAGOGASTRODUODENOSCOPY (EGD);  Surgeon: Clarene Essex, MD;  Location: [facility] ENDOSCOPY;  Service: Endoscopy;  Laterality: N/A;   ESOPHAGOGASTRODUODENOSCOPY (EGD) WITH PROPOFOL N/A 11/13/2021   Procedure: ESOPHAGOGASTRODUODENOSCOPY (EGD) WITH PROPOFOL;  Surgeon: Ronnette Juniper, MD;  Location: [facility];  Service: Gastroenterology;  Laterality: N/A;   FINGER SURGERY     middle finger on left hand   HYSTERECTOMY ABDOMINAL WITH SALPINGO-OOPHORECTOMY  Bilateral 05/11/2020   Procedure: TOTAL ABDOMINAL HYSTERECTOMY WITH BILATERAL SALPINGO-OOPHORECTOMY;  Surgeon: Molli Posey, MD;  Location: [facility];  Service: Gynecology;  Laterality: Bilateral;  NEEDS BED   MASTECTOMY COMPLETE / SIMPLE W/ SENTINEL NODE BIOPSY  05/2009   Archie Endo 05/23/2009   TOTAL KNEE ARTHROPLASTY Right 10/23/2016   TOTAL KNEE ARTHROPLASTY Right 10/23/2016   Procedure: RIGHT TOTAL KNEE ARTHROPLASTY;  Surgeon: [name], MD;  Location: [facility];   Service: Orthopedics;  Laterality: Right;    OB History   No obstetric history on file.      Home Medications    Prior to Admission medications   Medication Sig Start Date End Date Taking? Authorizing Provider  amLODipine (NORVASC) 10 MG tablet Take 1 tablet (10 mg total) by mouth daily. 04/19/22  Yes Precious Gilding, DO  amoxicillin-clavulanate (AUGMENTIN) 875-125 MG tablet Take 1 tablet by mouth 2 (two) times daily with a meal for 10 days. 06/17/22 06/27/22 Yes Bailie Christenbury L, PA  fluticasone (FLONASE) 50 MCG/ACT nasal spray Place 1 spray into both nostrils daily. 06/17/22  Yes Treshaun Carrico L, PA  gabapentin (NEURONTIN) 100 MG capsule Take 2 capsules (200 mg total) by mouth 3 (three) times daily. Patient taking differently: Take 100 mg by mouth daily. 01/07/20  Yes Richarda Osmond, MD  NASAL SPRAY SALINE NA Place 1 spray into the nose daily as needed (Nasal congestion).   Yes [provider]  pantoprazole (PROTONIX) 40 MG tablet Take 40 mg by mouth as needed. 04/19/22  Yes [provider]  rosuvastatin (CRESTOR) 20 MG tablet Take 1 tablet (20 mg total) by mouth daily. 09/28/21  Yes Precious Gilding, DO  valACYclovir (VALTREX) 1000 MG tablet Take 1 tablet (1,000 mg total) by mouth 3 (three) times daily for 7 days. 06/17/22 06/24/22 Yes Arlo Buffone L, PA  ferrous sulfate (FERROUSUL) 325 (65 FE) MG tablet Take 1 tablet (325 mg total) by mouth every other day. 12/04/21 03/04/22  Alen Bleacher, MD  folic acid (FOLVITE) 1 MG tablet Take 1 tablet (1 mg total) by mouth daily. 10/24/21   Precious Gilding, DO  thiamine 100 MG tablet Take 1 tablet (100 mg total) by mouth daily. Patient not taking: Reported on 05/06/2022 10/24/21   Precious Gilding, DO    Family History Family History  Problem Relation Age of Onset   Cancer Other    Diabetes Mother    Hypertension Mother    Diabetes Brother    Hypertension Father    Arthritis/Rheumatoid Father    Cancer Father    Cancer Maternal Grandfather     Heart disease Brother     Social History Social History   Tobacco Use   Smoking status: Never   Smokeless tobacco: Never  Vaping Use   Vaping Use: Never used  Substance Use Topics   Alcohol use: Yes    Alcohol/week: 14.0 standard drinks of alcohol    Types: 10 Glasses of wine, 4 Shots of liquor per week    Comment: daily since her 67s several a day    Drug use: Yes    Frequency: 7.0 times per week    Types: Marijuana     Allergies   Aspirin   Review of Systems Review of Systems As per HPI  Physical Exam Triage Vital Signs ED Triage Vitals  Enc Vitals Group     BP 06/17/22 1204 134/82     Pulse Rate 06/17/22 1204 82     Resp 06/17/22 1204 16  Temp 06/17/22 1204 98 F (36.7 C)     Temp Source 06/17/22 1204 Oral     SpO2 06/17/22 1204 96 %     Weight --      Height --      Head Circumference --      Peak Flow --      Pain Score 06/17/22 1203 8     Pain Loc --      Pain Edu? --      Excl. in [place]? --    No data found.  Updated Vital Signs BP 134/82 (BP Location: Left Arm)   Pulse 82   Temp 98 F (36.7 C) (Oral)   Resp 16   SpO2 96%   Visual Acuity Right Eye Distance:   Left Eye Distance:   Bilateral Distance:    Right Eye Near:   Left Eye Near:    Bilateral Near:     Physical Exam Vitals and nursing note reviewed.  Constitutional:      General: She is not in acute distress.    Appearance: Normal appearance. She is well-developed and normal weight. She is not ill-appearing, toxic-appearing or diaphoretic.  HENT:     Head: Normocephalic and atraumatic.     Jaw: There is normal jaw occlusion. No trismus or swelling.     Salivary Glands: Right salivary gland is not diffusely enlarged. Left salivary gland is not diffusely enlarged.     Right Ear: Tympanic membrane, ear canal and external ear normal. There is no impacted cerumen. Tympanic membrane is not scarred, erythematous, retracted or bulging.     Left Ear: Tympanic membrane, ear canal  and external ear normal. There is no impacted cerumen. Tympanic membrane is not scarred, erythematous, retracted or bulging.     Nose: Nasal tenderness (tenderness noted to three erythematous papules to L nasal bridge) present. No congestion or rhinorrhea.     Left Turbinates: Enlarged and swollen.     Right Sinus: No maxillary sinus tenderness or frontal sinus tenderness.     Left Sinus: Frontal sinus tenderness present. No maxillary sinus tenderness.      Mouth/Throat:     Mouth: Mucous membranes are moist.     Pharynx: Oropharynx is clear. No oropharyngeal exudate or posterior oropharyngeal erythema.  Eyes:     General: No scleral icterus.       Right eye: No discharge.        Left eye: No discharge.     Extraocular Movements: Extraocular movements intact.     Conjunctiva/sclera: Conjunctivae normal.     Pupils: Pupils are equal, round, and reactive to light.  Cardiovascular:     Rate and Rhythm: Normal rate and regular rhythm.     Pulses: Normal pulses.     Heart sounds: No murmur heard. Pulmonary:     Effort: Pulmonary effort is normal. No respiratory distress.     Breath sounds: Normal breath sounds. No stridor. No wheezing, rhonchi or rales.  Abdominal:     Palpations: Abdomen is soft.     Tenderness: There is no abdominal tenderness.  Musculoskeletal:        General: No swelling.     Cervical back: Normal range of motion and neck supple. No rigidity or tenderness.  Lymphadenopathy:     Cervical: Cervical adenopathy (L sided submandibular and posterior cervical lymph chain) present.  Skin:    General: Skin is warm and dry.     Capillary Refill: Capillary refill takes less than 2 seconds.  Findings: Rash (nasal bridge on L) present. No bruising.  Neurological:     General: No focal deficit present.     Mental Status: She is alert and oriented to person, place, and time. Mental status is at baseline.     Cranial Nerves: Cranial nerves 2-12 are intact. No cranial nerve  deficit or facial asymmetry.     Sensory: Sensation is intact.     Motor: Motor function is intact. No weakness, tremor, atrophy or pronator drift.     Coordination: Coordination is intact. Romberg sign negative. Coordination normal. Finger-Nose-Finger Test normal.     Gait: Gait is intact.     Deep Tendon Reflexes: Reflexes are normal and symmetric.  Psychiatric:        Mood and Affect: Mood normal.      UC Treatments / Results  Labs (all labs ordered are listed, but only abnormal results are displayed) Labs Reviewed - No data to display  EKG   Radiology No results found.  Procedures Procedures (including critical care time)  Medications Ordered in UC Medications  ketorolac (TORADOL) 30 MG/ML injection 30 mg (30 mg Intramuscular Given 06/17/22 1238)    Initial Impression / Assessment and Plan / UC Course  I have reviewed the triage vital signs and the nursing notes.  Pertinent labs & imaging results that were available during my care of the patient were reviewed by me and considered in my medical decision making (see chart for details).     Acute sinusitis - pt's sx reproducible to palpation on L frontal sinus. please start taking the antibiotic twice daily with food for the next 10 days.  Please also use Flonase nasal spray to help open up your nasal sinuses.  Drink plenty of water.  You may take Tylenol 1000 mg up to 3 times daily to help with your symptoms. Ketorolac IM given in office, last GFR 59.  Rash -this is nonspecific, patient is uncertain how long it has been there. It is painful to touch, I suspect this bacterial in nature, however given the burning description, would like to cover for possible developing shingles. There is no rash to nasal tip, eye or ear. Will do valtrex TID. Encouraged pt to see PCP in 2-3 days for recheck. Any new or worsening sx, pt was encouraged to head to ER    Final Clinical Impressions(s) / UC Diagnoses   Final diagnoses:  Acute  non-recurrent frontal sinusitis  Rash and nonspecific skin eruption     Discharge Instructions      Please start taking the antibiotic as prescribed twice daily with food. Please take 1000 mg of Tylenol up to 3 times daily as needed for the discomfort. Additionally, I have prescribed a nasal spray for you to use to help with the congestion. Please start taking Valtrex 3 times daily for the next 7 days. If your symptoms persist or worsen, please head to the emergency room. Call PCP and schedule follow up in 2-3 days.     ED Prescriptions     Medication Sig Dispense Auth. Provider   amoxicillin-clavulanate (AUGMENTIN) 875-125 MG tablet Take 1 tablet by mouth 2 (two) times daily with a meal for 10 days. 20 tablet Sheniah Supak L, PA   fluticasone (FLONASE) 50 MCG/ACT nasal spray Place 1 spray into both nostrils daily. 16 mL Dalyn Kjos L, PA   valACYclovir (VALTREX) 1000 MG tablet Take 1 tablet (1,000 mg total) by mouth 3 (three) times daily for 7 days. 21 tablet  Amalia Edgecombe L, PA      PDMP not reviewed this encounter.   Chaney Malling, [place] 06/17/22 1302

## 2022-06-17 NOTE — ED Triage Notes (Signed)
Pt is here for pressure on left side of head and behind eyes . Pt has not taken any OTC meds .

## 2022-06-25 ENCOUNTER — Ambulatory Visit (INDEPENDENT_AMBULATORY_CARE_PROVIDER_SITE_OTHER): Payer: Medicare HMO | Admitting: Student

## 2022-06-25 VITALS — BP 142/75 | HR 70 | Ht 60.0 in | Wt 147.6 lb

## 2022-06-25 DIAGNOSIS — K5900 Constipation, unspecified: Secondary | ICD-10-CM

## 2022-06-25 DIAGNOSIS — F1020 Alcohol dependence, uncomplicated: Secondary | ICD-10-CM

## 2022-06-25 DIAGNOSIS — R21 Rash and other nonspecific skin eruption: Secondary | ICD-10-CM | POA: Insufficient documentation

## 2022-06-25 DIAGNOSIS — D509 Iron deficiency anemia, unspecified: Secondary | ICD-10-CM | POA: Diagnosis not present

## 2022-06-25 DIAGNOSIS — I10 Essential (primary) hypertension: Secondary | ICD-10-CM

## 2022-06-25 DIAGNOSIS — D649 Anemia, unspecified: Secondary | ICD-10-CM | POA: Diagnosis not present

## 2022-06-25 MED ORDER — THIAMINE HCL 100 MG PO TABS: 100.0000 mg | ORAL_TABLET | Freq: Every day | ORAL | 1 refills | Status: AC

## 2022-06-25 MED ORDER — FERROUS SULFATE 325 (65 FE) MG PO TABS: 325.0000 mg | ORAL_TABLET | Freq: Every day | ORAL | 0 refills | Status: AC

## 2022-06-25 MED ORDER — POLYETHYLENE GLYCOL 3350 17 GM/SCOOP PO POWD: 17.0000 g | Freq: Every day | ORAL | 0 refills | Status: AC

## 2022-06-25 MED ORDER — FOLIC ACID 1 MG PO TABS: 1.0000 mg | ORAL_TABLET | Freq: Every day | ORAL | 1 refills | Status: DC

## 2022-06-25 NOTE — Patient Instructions (Addendum)
It was great to see you! Thank you for allowing me to participate in your care!  I recommend that you always bring your medications to each appointment as this makes it easy to ensure you are on the correct medications and helps [place] not miss when refills are needed.  Our plans for today:  - I prescribed miralax. Take this daily while you are taking the iron to prevent constipation. You can increase to twice a day or decrease to every other day as needed with a goal of having 1 soft, smooth bowel movement a day without straining.  -I recommend taking the iron supplement every day for now -I have sent in supplements for thiamine and folate to take daily.   -Please check your blood pressure a few times a week and record the readings.  If your blood pressure is consistently over 140/90, please make an appointment to see me in the coming weeks -Return for follow-up visit in 3 months  We are checking some labs today, I will call you if they are abnormal will send you a MyChart message or a letter if they are normal.  If you do not hear about your labs in the next 2 weeks please let us know.  Take care and seek immediate care sooner if you develop any concerns.   Dr. Precious Gilding, DO [facility]

## 2022-06-25 NOTE — Progress Notes (Signed)
SUBJECTIVE:   CHIEF COMPLAINT / HPI:   Rash on face  sinus pressure Patient was seen on 06/17/2022 at an urgent care due to sinus pressure and a rash on the left side of her nose.  She was treated with Augmentin for sinus infection, prescribed Flonase and valacyclovir for possible shingles. She stopped taking the medications  because they were too big but her sinus' now feel better.  She still has evidence of rash on her face, 3-4 small erythematous areas which she states itch on occasion and she scratches.  She is unsure that when the rash appeared, unsure if it is gotten better or worse.  Per chart review, it appears it has improved.  Iron deficiency anemia Seen at [facility] this past November for evaluation of her iron deficiency anemia, was not agreeable to IV iron infusion. At that visit it is documented she agreed to take the iron supplement and follow-up with them in 3 months.  She states she has been taking OTC iron supplement at least three times a week. Does not want to take it daily because it makes her constipated. No lightheadedness, not feeling like heart is racing, no blood in stool.  Follows with GI for erosive gastritis, recently had colonoscopy and endoscopy.  No evidence of GI bleed.  HTN Currently taking amlodipine 10 mg daily.  No CP, SOB, changes in vision.  BP elevated in office today but patient adamantly does not want to start a new medication.  Alcohol use disorder Patient has been drinking since age 72.  Drinks a variety of alcohol, does not quantify it for me today she states she drinks with friends and is not sure how much or what she drinks regularly.  Has no interest in receiving alcohol cessation resources.  SHe is willing to start folate and thiamine supplements today.   PERTINENT  PMH / PSH: IDA, HTN, alcohol use use disorder  OBJECTIVE:   BP (!) 142/75   Pulse 70   Ht 5' (1.524 m)   Wt 147 lb 9.6 oz (67 kg)   SpO2 100%   BMI 28.83  kg/m    General: NAD, pleasant, able to participate in exam Cardiac: RRR, no murmurs. Respiratory: CTAB, normal effort, No wheezes, rales or rhonchi Abdomen: Bowel sounds present, nontender, nondistended, soft Extremities: no edema or cyanosis.  Skin: warm and dry, 3-4 erythematous areas on left nasal bridge, appear to be healing.  No edema no active bleeding.  Neuro: alert, no obvious focal deficits Psych: Normal affect and mood  ASSESSMENT/PLAN:   Iron deficiency anemia -Ferrous sulfate refilled today, patient advised to take this daily along with MiraLAX to prevent constipation -CBC today -Follow-up in 3 months  Alcohol use disorder, severe, dependence ([place]) Patient declines any resources for alcohol cessation.  She is willing to start folate and thiamine supplements today which have been prescribed  Rash Unsure if this was shingles as suspected at urgent care.  Regardless, it appears to be healing.  Patient advised to apply Vaseline or Aquaphor to the area to prevent itching  Essential hypertension, benign BP over goal today at 142/75.  She currently takes amlodipine 10 mg daily.  She does not wish to add another medication to her regimen but agrees to check her blood pressure at home a few times a week and record her readings.  If BP is consistently above 140/90 she agrees to make an appointment for further evaluation     Dr. Judson Roch  Renaud Celli, [place]

## 2022-06-25 NOTE — Assessment & Plan Note (Signed)
Patient declines any resources for alcohol cessation.  She is willing to start folate and thiamine supplements today which have been prescribed

## 2022-06-25 NOTE — Assessment & Plan Note (Signed)
-  Ferrous sulfate refilled today, patient advised to take this daily along with MiraLAX to prevent constipation -CBC today -Follow-up in 3 months

## 2022-06-25 NOTE — Assessment & Plan Note (Signed)
BP over goal today at 142/75.  She currently takes amlodipine 10 mg daily.  She does not wish to add another medication to her regimen but agrees to check her blood pressure at home a few times a week and record her readings.  If BP is consistently above 140/90 she agrees to make an appointment for further evaluation

## 2022-06-25 NOTE — Assessment & Plan Note (Signed)
Unsure if this was shingles as suspected at urgent care.  Regardless, it appears to be healing.  Patient advised to apply Vaseline or Aquaphor to the area to prevent itching

## 2022-06-26 ENCOUNTER — Encounter: Payer: Self-pay | Admitting: Student

## 2022-06-26 LAB — CBC
Hematocrit: 29 % — ABNORMAL LOW (ref 34.0–46.6)
Hemoglobin: 9.8 g/dL — ABNORMAL LOW (ref 11.1–15.9)
MCH: 31.3 pg (ref 26.6–33.0)
MCHC: 33.8 g/dL (ref 31.5–35.7)
MCV: 93 fL (ref 79–97)
Platelets: 391 10*3/uL (ref 150–450)
RBC: 3.13 x10E6/uL — ABNORMAL LOW (ref 3.77–5.28)
RDW: 14.5 % (ref 11.7–15.4)
WBC: 4.2 10*3/uL (ref 3.4–10.8)

## 2022-07-17 ENCOUNTER — Other Ambulatory Visit: Payer: Self-pay | Admitting: Student

## 2022-07-17 DIAGNOSIS — I10 Essential (primary) hypertension: Secondary | ICD-10-CM

## 2022-07-17 DIAGNOSIS — E7800 Pure hypercholesterolemia, unspecified: Secondary | ICD-10-CM

## 2022-08-05 ENCOUNTER — Encounter: Payer: Self-pay | Admitting: Student

## 2022-08-05 ENCOUNTER — Other Ambulatory Visit: Payer: Self-pay

## 2022-08-05 ENCOUNTER — Ambulatory Visit (INDEPENDENT_AMBULATORY_CARE_PROVIDER_SITE_OTHER): Payer: Medicare HMO | Admitting: Student

## 2022-08-05 VITALS — BP 167/98 | HR 99 | Ht 60.0 in | Wt 146.4 lb

## 2022-08-05 DIAGNOSIS — D509 Iron deficiency anemia, unspecified: Secondary | ICD-10-CM

## 2022-08-05 DIAGNOSIS — R21 Rash and other nonspecific skin eruption: Secondary | ICD-10-CM

## 2022-08-05 DIAGNOSIS — I10 Essential (primary) hypertension: Secondary | ICD-10-CM

## 2022-08-05 MED ORDER — TRIAMCINOLONE ACETONIDE 0.1 % EX OINT: 1.0000 | TOPICAL_OINTMENT | Freq: Two times a day (BID) | CUTANEOUS | 0 refills | Status: AC

## 2022-08-05 NOTE — Progress Notes (Unsigned)
    SUBJECTIVE:   CHIEF COMPLAINT / HPI:   Rash Itchy rash on legs present for past few days., She has been applying vaseline  Anemia Takes iron supplement a couple times/week   PERTINENT  PMH / PSH: iron deficiency anemia, alcoholism   OBJECTIVE:   BP (!) 181/108   Pulse 99   Ht 5' (1.524 m)   Wt 146 lb 6.4 oz (66.4 kg)   SpO2 98%   BMI 28.59 kg/m    General: NAD, pleasant, able to participate in exam Cardiac: RRR, no murmurs. Respiratory: CTAB, normal effort, No wheezes, rales or rhonchi Abdomen: Bowel sounds present, nontender, nondistended, no hepatosplenomegaly. Extremities: no edema or cyanosis. Skin: warm and dry, no rashes noted Neuro: alert, no obvious focal deficits Psych: Normal affect and mood  ASSESSMENT/PLAN:   No problem-specific Assessment & Plan notes found for this encounter.     Dr. Erick Alley, DO [facility]    {    This will disappear when note is signed, click to select method of visit    :1}

## 2022-08-05 NOTE — Patient Instructions (Addendum)
It was great to see you! Thank you for allowing me to participate in your care!   Our plans for today:  - I sent a prescription to your pharmacy for a steroid ointment. Apply it twice a day for the next two weeks. - Take your blood pressure medication when you get home  - Take you iron supplement when you get home  - Return in 2 weeks for a follow up appointment    Take care and seek immediate care sooner if you develop any concerns.   Dr. Erick Alley, DO [facility]

## 2022-08-06 NOTE — Assessment & Plan Note (Signed)
BP likely elevated due to not taking medication this morning.  Patient to return in 2 weeks for BP check and advised to take her medications.

## 2022-08-06 NOTE — Assessment & Plan Note (Addendum)
Can consider prurigo nodularis as cause of rash as patient admits to intense scratching.  May be a contact dermatitis.  Reassuringly, she has no systemic symptoms and rash does not appear to be infected, no warmth of the area.  Patient advised to stop scratching her legs and apply triamcinolone ointment twice daily for 2 weeks. -Patient to return in 2 weeks for follow-up

## 2022-08-06 NOTE — Assessment & Plan Note (Signed)
Patient advised to take her iron supplements more regularly.  Can monitor with CBC in a few months.

## 2022-08-13 ENCOUNTER — Other Ambulatory Visit: Payer: Self-pay | Admitting: Student

## 2022-08-13 DIAGNOSIS — I10 Essential (primary) hypertension: Secondary | ICD-10-CM

## 2022-09-02 ENCOUNTER — Ambulatory Visit: Payer: Medicare HMO | Admitting: Student

## 2022-09-03 ENCOUNTER — Other Ambulatory Visit: Payer: Self-pay | Admitting: Nurse Practitioner

## 2022-09-03 DIAGNOSIS — Z0000 Encounter for general adult medical examination without abnormal findings: Secondary | ICD-10-CM

## 2022-09-03 DIAGNOSIS — K760 Fatty (change of) liver, not elsewhere classified: Secondary | ICD-10-CM

## 2022-09-03 DIAGNOSIS — F1020 Alcohol dependence, uncomplicated: Secondary | ICD-10-CM

## 2022-09-04 ENCOUNTER — Ambulatory Visit: Payer: Medicare HMO | Admitting: Student

## 2022-09-20 ENCOUNTER — Encounter: Payer: Self-pay | Admitting: Nurse Practitioner

## 2022-09-24 ENCOUNTER — Other Ambulatory Visit: Payer: Self-pay | Admitting: Nurse Practitioner

## 2022-09-24 DIAGNOSIS — Z0000 Encounter for general adult medical examination without abnormal findings: Secondary | ICD-10-CM

## 2022-10-01 ENCOUNTER — Other Ambulatory Visit: Payer: Self-pay | Admitting: Student

## 2022-10-01 DIAGNOSIS — I10 Essential (primary) hypertension: Secondary | ICD-10-CM

## 2022-10-10 ENCOUNTER — Other Ambulatory Visit: Payer: Medicare HMO

## 2022-11-04 ENCOUNTER — Ambulatory Visit: Payer: Medicare HMO

## 2022-11-18 ENCOUNTER — Ambulatory Visit: Payer: Medicare HMO

## 2022-11-27 ENCOUNTER — Encounter (HOSPITAL_COMMUNITY): Payer: Self-pay

## 2022-11-27 ENCOUNTER — Ambulatory Visit: Payer: Medicare HMO

## 2022-11-27 ENCOUNTER — Other Ambulatory Visit: Payer: Self-pay

## 2022-11-27 ENCOUNTER — Emergency Department (HOSPITAL_COMMUNITY)
Admission: EM | Admit: 2022-11-27 | Discharge: 2022-11-27 | Discharge status: Against Medical Advice | Payer: Medicare HMO | Attending: Emergency Medicine | Admitting: Emergency Medicine

## 2022-11-27 DIAGNOSIS — Z5321 Procedure and treatment not carried out due to patient leaving prior to being seen by health care provider: Secondary | ICD-10-CM | POA: Diagnosis not present

## 2022-11-27 DIAGNOSIS — F1090 Alcohol use, unspecified, uncomplicated: Secondary | ICD-10-CM | POA: Diagnosis not present

## 2022-11-27 DIAGNOSIS — S40212A Abrasion of left shoulder, initial encounter: Secondary | ICD-10-CM | POA: Insufficient documentation

## 2022-11-27 DIAGNOSIS — X58XXXA Exposure to other specified factors, initial encounter: Secondary | ICD-10-CM | POA: Diagnosis not present

## 2022-11-27 DIAGNOSIS — Y908 Blood alcohol level of 240 mg/100 ml or more: Secondary | ICD-10-CM | POA: Diagnosis not present

## 2022-11-27 DIAGNOSIS — L298 Other pruritus: Secondary | ICD-10-CM | POA: Diagnosis present

## 2022-11-27 LAB — CBC WITH DIFFERENTIAL/PLATELET
Abs Immature Granulocytes: 0.01 10*3/uL (ref 0.00–0.07)
Basophils Absolute: 0.1 10*3/uL (ref 0.0–0.1)
Basophils Relative: 1 %
Eosinophils Absolute: 0 10*3/uL (ref 0.0–0.5)
Eosinophils Relative: 1 %
HCT: 28.2 % — ABNORMAL LOW (ref 36.0–46.0)
Hemoglobin: 9.7 g/dL — ABNORMAL LOW (ref 12.0–15.0)
Immature Granulocytes: 0 %
Lymphocytes Relative: 50 %
Lymphs Abs: 1.8 10*3/uL (ref 0.7–4.0)
MCH: 32.7 pg (ref 26.0–34.0)
MCHC: 34.4 g/dL (ref 30.0–36.0)
MCV: 94.9 fL (ref 80.0–100.0)
Monocytes Absolute: 0.5 10*3/uL (ref 0.1–1.0)
Monocytes Relative: 14 %
Neutro Abs: 1.2 10*3/uL — ABNORMAL LOW (ref 1.7–7.7)
Neutrophils Relative %: 34 %
Platelets: 191 10*3/uL (ref 150–400)
RBC: 2.97 MIL/uL — ABNORMAL LOW (ref 3.87–5.11)
RDW: 13.7 % (ref 11.5–15.5)
WBC: 3.6 10*3/uL — ABNORMAL LOW (ref 4.0–10.5)
nRBC: 0 % (ref 0.0–0.2)

## 2022-11-27 LAB — COMPREHENSIVE METABOLIC PANEL
ALT: 36 U/L (ref 0–44)
AST: 115 U/L — ABNORMAL HIGH (ref 15–41)
Albumin: 3.9 g/dL (ref 3.5–5.0)
Alkaline Phosphatase: 39 U/L (ref 38–126)
Anion gap: 14 (ref 5–15)
BUN: 10 mg/dL (ref 8–23)
CO2: 24 mmol/L (ref 22–32)
Calcium: 8.1 mg/dL — ABNORMAL LOW (ref 8.9–10.3)
Chloride: 97 mmol/L — ABNORMAL LOW (ref 98–111)
Creatinine, Ser: 0.99 mg/dL (ref 0.44–1.00)
GFR, Estimated: >60 mL/min (ref >60)
Glucose, Bld: 104 mg/dL — ABNORMAL HIGH (ref 70–99)
Potassium: 4.1 mmol/L (ref 3.5–5.1)
Sodium: 135 mmol/L (ref 135–145)
Total Bilirubin: 0.6 mg/dL (ref 0.3–1.2)
Total Protein: 7.8 g/dL (ref 6.5–8.1)

## 2022-11-27 LAB — ETHANOL: Alcohol, Ethyl (B): 352 mg/dL (ref <10)

## 2022-11-27 NOTE — ED Triage Notes (Signed)
Pt BIB [facility] with c/o chronic itching, positive ETOH. Went to MD because she has been itching for 3 months. PCP advised to stop drinking and drink more water.   130/80 100% room air  HR 100

## 2022-11-27 NOTE — ED Notes (Signed)
Pt stated she needed to leave and wanted to check herself out.

## 2022-11-27 NOTE — ED Provider Triage Note (Signed)
Emergency Medicine Provider Triage Evaluation Note  Carrie [name] , a 72 y.o. female  was evaluated in triage.  Pt complains of chronic itching.  Patient was seen at [facility] a few days ago and there was concern about possible bile acid deposits due to patient's heavy alcohol use.  Patient reports that she has had chronic itching for some time now.  No obvious rash present.  Reports that she last drank earlier today.  Denies any chest pain, shortness of breath, abdominal pain.  Denies any urinary symptoms at this time.  Review of Systems  Positive: As above Negative: As above  Physical Exam  BP (!) 142/97 (BP Location: Left Arm)   Pulse 83   Temp 98 F (36.7 C) (Oral)   Resp 20   SpO2 100%  Gen:   Awake, no distress   Resp:  Normal effort  MSK:   Moves extremities without difficulty  Other:  No obvious skin abnormality seen. Some skin abrasion to left shoulder observed but appear to be old. No rash.  Medical Decision Making  Medically screening exam initiated at 3:01 PM.  Appropriate orders placed.  KOA CRICKMORE was informed that the remainder of the evaluation will be completed by another provider, this initial triage assessment does not replace that evaluation, and the importance of remaining in the ED until their evaluation is complete.     Smitty Knudsen, PA-C 11/27/22 1502

## 2022-11-28 ENCOUNTER — Ambulatory Visit (HOSPITAL_COMMUNITY): Payer: Medicare HMO

## 2022-12-05 ENCOUNTER — Ambulatory Visit
Admission: RE | Admit: 2022-12-05 | Discharge: 2022-12-05 | Disposition: A | Discharge status: Home / Self Care | Payer: Medicare HMO | Source: Ambulatory Visit | Attending: Nurse Practitioner | Admitting: Nurse Practitioner

## 2022-12-05 DIAGNOSIS — Z0000 Encounter for general adult medical examination without abnormal findings: Secondary | ICD-10-CM

## 2023-03-07 ENCOUNTER — Other Ambulatory Visit: Payer: Self-pay

## 2023-03-07 DIAGNOSIS — I10 Essential (primary) hypertension: Secondary | ICD-10-CM

## 2023-03-07 MED ORDER — AMLODIPINE BESYLATE 10 MG PO TABS
10.0000 mg | ORAL_TABLET | Freq: Every day | ORAL | 0 refills | Status: AC
Start: 2023-03-07 — End: ?

## 2023-05-27 ENCOUNTER — Other Ambulatory Visit: Payer: Self-pay | Admitting: Nurse Practitioner

## 2023-05-27 DIAGNOSIS — R7989 Other specified abnormal findings of blood chemistry: Secondary | ICD-10-CM

## 2023-06-05 ENCOUNTER — Other Ambulatory Visit: Payer: Medicare HMO

## 2023-06-11 ENCOUNTER — Ambulatory Visit
Admission: RE | Admit: 2023-06-11 | Discharge: 2023-06-11 | Disposition: A | Discharge status: Home / Self Care | Source: Ambulatory Visit | Attending: Nurse Practitioner | Admitting: Nurse Practitioner

## 2023-06-11 DIAGNOSIS — R7989 Other specified abnormal findings of blood chemistry: Secondary | ICD-10-CM

## 2023-06-30 ENCOUNTER — Other Ambulatory Visit: Payer: Self-pay | Admitting: Student

## 2023-06-30 DIAGNOSIS — F1020 Alcohol dependence, uncomplicated: Secondary | ICD-10-CM

## 2023-07-10 ENCOUNTER — Other Ambulatory Visit: Payer: Self-pay | Admitting: Student

## 2023-07-10 DIAGNOSIS — E7800 Pure hypercholesterolemia, unspecified: Secondary | ICD-10-CM

## 2023-09-09 ENCOUNTER — Encounter: Payer: Self-pay | Admitting: *Deleted

## 2023-10-23 ENCOUNTER — Other Ambulatory Visit: Payer: Self-pay

## 2023-10-23 DIAGNOSIS — R1310 Dysphagia, unspecified: Secondary | ICD-10-CM

## 2023-11-12 ENCOUNTER — Other Ambulatory Visit: Payer: Self-pay

## 2023-11-12 DIAGNOSIS — Z1231 Encounter for screening mammogram for malignant neoplasm of breast: Secondary | ICD-10-CM

## 2023-11-13 ENCOUNTER — Other Ambulatory Visit

## 2023-12-04 ENCOUNTER — Other Ambulatory Visit: Payer: Self-pay

## 2023-12-04 DIAGNOSIS — E7800 Pure hypercholesterolemia, unspecified: Secondary | ICD-10-CM

## 2023-12-05 MED ORDER — ROSUVASTATIN CALCIUM 20 MG PO TABS
20.0000 mg | ORAL_TABLET | Freq: Every day | ORAL | 0 refills | Status: AC
Start: 2023-12-05 — End: ?

## 2023-12-09 ENCOUNTER — Ambulatory Visit

## 2023-12-10 ENCOUNTER — Ambulatory Visit
Admission: RE | Admit: 2023-12-10 | Discharge: 2023-12-10 | Disposition: A | Discharge status: Home / Self Care | Source: Ambulatory Visit

## 2023-12-10 DIAGNOSIS — Z1231 Encounter for screening mammogram for malignant neoplasm of breast: Secondary | ICD-10-CM

## 2023-12-13 ENCOUNTER — Other Ambulatory Visit: Payer: Self-pay

## 2023-12-13 ENCOUNTER — Emergency Department (HOSPITAL_COMMUNITY)

## 2023-12-13 ENCOUNTER — Encounter (HOSPITAL_COMMUNITY): Payer: Self-pay

## 2023-12-13 ENCOUNTER — Emergency Department (HOSPITAL_COMMUNITY)
Admission: EM | Admit: 2023-12-13 | Discharge: 2023-12-14 | Disposition: A | Discharge status: Home / Self Care | Attending: Emergency Medicine | Admitting: Emergency Medicine

## 2023-12-13 DIAGNOSIS — R112 Nausea with vomiting, unspecified: Secondary | ICD-10-CM

## 2023-12-13 DIAGNOSIS — N3000 Acute cystitis without hematuria: Secondary | ICD-10-CM | POA: Insufficient documentation

## 2023-12-13 DIAGNOSIS — R079 Chest pain, unspecified: Secondary | ICD-10-CM | POA: Insufficient documentation

## 2023-12-13 DIAGNOSIS — Z853 Personal history of malignant neoplasm of breast: Secondary | ICD-10-CM | POA: Diagnosis not present

## 2023-12-13 DIAGNOSIS — I10 Essential (primary) hypertension: Secondary | ICD-10-CM | POA: Diagnosis not present

## 2023-12-13 DIAGNOSIS — Z79899 Other long term (current) drug therapy: Secondary | ICD-10-CM | POA: Insufficient documentation

## 2023-12-13 LAB — RAPID URINE DRUG SCREEN, HOSP PERFORMED
Amphetamines: NOT DETECTED
Barbiturates: NOT DETECTED
Benzodiazepines: NOT DETECTED
Cocaine: NOT DETECTED
Opiates: NOT DETECTED
Tetrahydrocannabinol: POSITIVE — AB

## 2023-12-13 LAB — CBC
HCT: 33.6 % — ABNORMAL LOW (ref 36.0–46.0)
Hemoglobin: 11.3 g/dL — ABNORMAL LOW (ref 12.0–15.0)
MCH: 34.9 pg — ABNORMAL HIGH (ref 26.0–34.0)
MCHC: 33.6 g/dL (ref 30.0–36.0)
MCV: 103.7 fL — ABNORMAL HIGH (ref 80.0–100.0)
Platelets: 364 K/uL (ref 150–400)
RBC: 3.24 MIL/uL — ABNORMAL LOW (ref 3.87–5.11)
RDW: 15.1 % (ref 11.5–15.5)
WBC: 6.6 K/uL (ref 4.0–10.5)
nRBC: 0 % (ref 0.0–0.2)

## 2023-12-13 LAB — TROPONIN I (HIGH SENSITIVITY)
Troponin I (High Sensitivity): 6 ng/L (ref <18)
Troponin I (High Sensitivity): 8 ng/L (ref <18)

## 2023-12-13 LAB — BASIC METABOLIC PANEL WITH GFR
Anion gap: 14 (ref 5–15)
BUN: 10 mg/dL (ref 8–23)
CO2: 19 mmol/L — ABNORMAL LOW (ref 22–32)
Calcium: 8.1 mg/dL — ABNORMAL LOW (ref 8.9–10.3)
Chloride: 104 mmol/L (ref 98–111)
Creatinine, Ser: 1.26 mg/dL — ABNORMAL HIGH (ref 0.44–1.00)
GFR, Estimated: 46 mL/min — ABNORMAL LOW (ref >60)
Glucose, Bld: 111 mg/dL — ABNORMAL HIGH (ref 70–99)
Potassium: 3.2 mmol/L — ABNORMAL LOW (ref 3.5–5.1)
Sodium: 137 mmol/L (ref 135–145)

## 2023-12-13 LAB — ETHANOL: Alcohol, Ethyl (B): 233 mg/dL — ABNORMAL HIGH (ref <15)

## 2023-12-13 LAB — LIPASE, BLOOD: Lipase: 40 U/L (ref 11–51)

## 2023-12-13 MED ORDER — ALUM & MAG HYDROXIDE-SIMETH 200-200-20 MG/5ML PO SUSP
30.0000 mL | Freq: Once | ORAL | Status: AC
  Administered 2023-12-13: 30 mL via ORAL
  Filled 2023-12-13: qty 30

## 2023-12-13 MED ORDER — ACETAMINOPHEN 500 MG PO TABS
1000.0000 mg | ORAL_TABLET | Freq: Once | ORAL | Status: AC
  Administered 2023-12-13: 1000 mg via ORAL
  Filled 2023-12-13: qty 2

## 2023-12-13 MED ORDER — FAMOTIDINE 20 MG PO TABS
20.0000 mg | ORAL_TABLET | Freq: Once | ORAL | Status: AC
  Administered 2023-12-13: 20 mg via ORAL
  Filled 2023-12-13: qty 1

## 2023-12-13 NOTE — ED Triage Notes (Signed)
 PER EMS: pt is from home with c/o nausea and vomiting x 12 hours. She also reports reproducible chest pain where she received 324 aspirin  and 4mg  of Zofran  IV by ems. She reports drinks a pint of wine each day, which she did today.   BP- 148/82, HR-90, RR-18, 98% RA CBG-126

## 2023-12-13 NOTE — ED Provider Triage Note (Signed)
 Emergency Medicine Provider Triage Evaluation Note  Carrie [name] , a 73 y.o. female  was evaluated in triage.  Pt complains of episode of nv, and then mid chest pain earlier today. No bloody or bilious emesis. No sob.   Review of Systems  Positive: Nv, cp.  Negative: Abd pain, sob.   Physical Exam  BP 138/73 (BP Location: Left Arm)   Pulse 90   Temp 98.2 F (36.8 C) (Oral)   Resp 20   Ht 1.524 m (5')   Wt 66.2 kg   SpO2 98%   BMI 28.51 kg/m  Gen:   Awake, no distress   Resp:  Normal effort, cta bil.  CV:  RRR no g/r/m MSK:   Moves extremities without difficulty no calf swelling or tenderness.  Abd:   Soft nt.   Medical Decision Making  Medically screening exam initiated at 8:50 PM.  Appropriate orders placed.  Carrie [name] was informed that the remainder of the evaluation will be completed by another provider, this initial triage assessment does not replace that evaluation, and the importance of remaining in the ED until their evaluation is complete.     Bernard Drivers, MD 12/13/23 2051

## 2023-12-13 NOTE — ED Notes (Signed)
 Patient transported to X-ray

## 2023-12-13 NOTE — ED Notes (Signed)
Dr. Steinl at bedside 

## 2023-12-14 DIAGNOSIS — N3000 Acute cystitis without hematuria: Secondary | ICD-10-CM | POA: Diagnosis not present

## 2023-12-14 LAB — URINALYSIS, ROUTINE W REFLEX MICROSCOPIC
Bilirubin Urine: NEGATIVE
Glucose, UA: NEGATIVE mg/dL
Hgb urine dipstick: NEGATIVE
Ketones, ur: NEGATIVE mg/dL
Nitrite: NEGATIVE
Protein, ur: 30 mg/dL — AB
Specific Gravity, Urine: 1.017 (ref 1.005–1.030)
WBC, UA: >50 WBC/hpf (ref 0–5)
pH: 5 (ref 5.0–8.0)

## 2023-12-14 LAB — HEPATIC FUNCTION PANEL
ALT: 34 U/L (ref 0–44)
AST: 75 U/L — ABNORMAL HIGH (ref 15–41)
Albumin: 3 g/dL — ABNORMAL LOW (ref 3.5–5.0)
Alkaline Phosphatase: 49 U/L (ref 38–126)
Bilirubin, Direct: <0.1 mg/dL (ref 0.0–0.2)
Total Bilirubin: 0.4 mg/dL (ref 0.0–1.2)
Total Protein: 7.1 g/dL (ref 6.5–8.1)

## 2023-12-14 MED ORDER — ONDANSETRON 4 MG PO TBDP: 4.0000 mg | ORAL_TABLET | Freq: Three times a day (TID) | ORAL | 0 refills | Status: AC | PRN

## 2023-12-14 MED ORDER — POTASSIUM CHLORIDE CRYS ER 20 MEQ PO TBCR
40.0000 meq | EXTENDED_RELEASE_TABLET | Freq: Once | ORAL | Status: AC
  Administered 2023-12-14: 40 meq via ORAL
  Filled 2023-12-14: qty 2

## 2023-12-14 MED ORDER — CEPHALEXIN 250 MG/5ML PO SUSR: 250.0000 mg | Freq: Four times a day (QID) | ORAL | 0 refills | Status: AC

## 2023-12-14 MED ORDER — FAMOTIDINE 20 MG PO TABS: 20.0000 mg | ORAL_TABLET | Freq: Every day | ORAL | 0 refills | Status: AC

## 2023-12-14 MED ORDER — PANTOPRAZOLE SODIUM 40 MG IV SOLR
40.0000 mg | Freq: Once | INTRAVENOUS | Status: AC
  Administered 2023-12-14: 40 mg via INTRAVENOUS
  Filled 2023-12-14: qty 10

## 2023-12-14 MED ORDER — SODIUM CHLORIDE 0.9 % IV SOLN
1.0000 g | Freq: Once | INTRAVENOUS | Status: AC
  Administered 2023-12-14: 1 g via INTRAVENOUS
  Filled 2023-12-14: qty 10

## 2023-12-14 MED ORDER — ONDANSETRON HCL 4 MG/2ML IJ SOLN
4.0000 mg | Freq: Once | INTRAMUSCULAR | Status: AC
  Administered 2023-12-14: 4 mg via INTRAVENOUS
  Filled 2023-12-14: qty 2

## 2023-12-14 NOTE — ED Provider Notes (Signed)
 [place] [facility] AT [facility] Provider Note   CSN: [id number] Arrival date & time: 12/13/23  2004     History  Chief Complaint  Patient presents with   Chest Pain   Nausea    Carrie [name] is a 73 y.o. female with PMH as listed below who presents with sudden onset nausea/vomiting that started while patient was cooking dinner. No h/o similar. After the nausea/vomiting had started, she started to develop mid chest pain rated 8/10 between her breasts. Still feels mildly nauseated and has some pain there now, rated 2/10. She was drinking red wine when her symptoms started and so she is not sure but she thinks there may have been some blood in the vomitus. No h/o DVT/PE, doesn't take a blood thinner. Has a h/o LGIB but denies recent hematochezia/melena, diarrhea, abdominal pain, urinary sxs.  No history of variceal bleeding.  Reports that she has also had dysuria for several weeks now.   Past Medical History:  Diagnosis Date   Arthritis    back, right hand (07/11/2014)   Breast cancer (HCC) 2011   right breast   Breast cancer, right breast (HCC) 05/2009   Chronic lower back pain    GERD (gastroesophageal reflux disease)    Hepatic steatosis 07/17/2011   Hypertension    Hypokalemia 07/17/2011   Lower GI bleeding 07/11/2014 hospitalized   Neuromuscular disorder (HCC)    numbness in legs and feet   Personal history of radiation therapy 2011       Home Medications Prior to Admission medications   Medication Sig Start Date End Date Taking? Authorizing Provider  cephALEXin  (KEFLEX ) 250 MG/5ML suspension Take 5 mLs (250 mg total) by mouth 4 (four) times daily for 5 days. 12/14/23 12/19/23 Yes Franklyn Sid SAILOR, MD  famotidine  (PEPCID ) 20 MG tablet Take 1 tablet (20 mg total) by mouth daily. 12/14/23  Yes Franklyn Sid SAILOR, MD  ondansetron  (ZOFRAN -ODT) 4 MG disintegrating tablet Take 1 tablet (4 mg total) by mouth every 8 (eight) hours as needed. 12/14/23  Yes Franklyn Sid SAILOR, MD  amLODipine  (NORVASC ) 10 MG tablet Take 1 tablet (10 mg total) by mouth daily. Needs apt before refill 03/07/23   Joshua Domino, DO  ferrous sulfate  (FERROUSUL) 325 (65 FE) MG tablet Take 1 tablet (325 mg total) by mouth daily with breakfast. 06/25/22   Joshua Domino, DO  fluticasone  (FLONASE ) 50 MCG/ACT nasal spray Place 1 spray into both nostrils daily. 06/17/22   Crain, Whitney L, PA  folic acid  (FOLVITE ) 1 MG tablet Take 1 tablet (1 mg total) by mouth daily. 06/30/23   Joshua Domino, DO  gabapentin  (NEURONTIN ) 100 MG capsule Take 2 capsules (200 mg total) by mouth 3 (three) times daily. Patient taking differently: Take 100 mg by mouth daily. 01/07/20   Lenon Marien CROME, MD  NASAL SPRAY SALINE NA Place 1 spray into the nose daily as needed (Nasal congestion).    [provider]  pantoprazole  (PROTONIX ) 40 MG tablet Take 40 mg by mouth as needed. 04/19/22   [provider]  polyethylene glycol powder (GLYCOLAX /MIRALAX ) 17 GM/SCOOP powder Take 17 g by mouth daily. 06/25/22   Joshua Domino, DO  rosuvastatin  (CRESTOR ) 20 MG tablet Take 1 tablet (20 mg total) by mouth daily. Needs appointment before refill 12/05/23   Nicholas Bar, MD  thiamine  (VITAMIN B1) 100 MG tablet Take 1 tablet (100 mg total) by mouth daily. 06/25/22   Joshua Domino, DO  triamcinolone  ointment (KENALOG )  0.1 % Apply 1 Application topically 2 (two) times daily. 08/05/22   Joshua Domino, DO      Allergies    Aspirin     Review of Systems   Review of Systems A 10 point review of systems was performed and is negative unless otherwise reported in HPI.  Physical Exam Updated Vital Signs BP 132/79   Pulse 90   Temp 98.6 F (37 C) (Oral)   Resp (!) 22   Ht 5' (1.524 m)   Wt 66.2 kg   SpO2 100%   BMI 28.51 kg/m  Physical Exam General: Normal appearing female, lying in bed.  HEENT: PERRLA, Sclera anicteric, MMM, trachea midline.  Cardiology: RRR, no murmurs/rubs/gallops. BL radial and DP pulses equal  bilaterally.  Resp: Normal respiratory rate and effort. CTAB, no wheezes, rhonchi, crackles.  Abd: Soft, non-tender, non-distended. No rebound tenderness or guarding.  GU: Deferred. MSK: No peripheral edema or signs of trauma. Extremities without deformity or TTP. No cyanosis or clubbing. Skin: warm, dry. No rashes or lesions. Back: No CVA tenderness Neuro: A&Ox4, CNs II-XII grossly intact. MAEs. Sensation grossly intact.  Psych: Normal mood and affect.   ED Results / Procedures / Treatments   Labs (all labs ordered are listed, but only abnormal results are displayed) Labs Reviewed  BASIC METABOLIC PANEL WITH GFR - Abnormal; Notable for the following components:      Result Value   Potassium 3.2 (*)    CO2 19 (*)    Glucose, Bld 111 (*)    Creatinine, Ser 1.26 (*)    Calcium  8.1 (*)    GFR, Estimated 46 (*)    All other components within normal limits  CBC - Abnormal; Notable for the following components:   RBC 3.24 (*)    Hemoglobin 11.3 (*)    HCT 33.6 (*)    MCV 103.7 (*)    MCH 34.9 (*)    All other components within normal limits  RAPID URINE DRUG SCREEN, HOSP PERFORMED - Abnormal; Notable for the following components:   Tetrahydrocannabinol POSITIVE (*)    All other components within normal limits  ETHANOL - Abnormal; Notable for the following components:   Alcohol, Ethyl (B) 233 (*)    All other components within normal limits  HEPATIC FUNCTION PANEL - Abnormal; Notable for the following components:   Albumin 3.0 (*)    AST 75 (*)    All other components within normal limits  URINALYSIS, ROUTINE W REFLEX MICROSCOPIC - Abnormal; Notable for the following components:   Color, Urine AMBER (*)    APPearance CLOUDY (*)    Protein, ur 30 (*)    Leukocytes,Ua MODERATE (*)    Bacteria, UA MANY (*)    Non Squamous Epithelial 0-5 (*)    All other components within normal limits  LIPASE, BLOOD  TROPONIN I (HIGH SENSITIVITY)  TROPONIN I (HIGH SENSITIVITY)    EKG EKG  Interpretation Date/Time:  Saturday December 13 2023 20:22:00 EDT Ventricular Rate:  89 PR Interval:  142 QRS Duration:  120 QT Interval:  410 QTC Calculation: 498 R Axis:   -8  Text Interpretation: Normal sinus rhythm Right bundle branch block Minimal voltage criteria for LVH, may be normal variant ( R in aVL ) PREVIOUS ECG IS PRESENT Confirmed by Franklyn Gills [phone]) on 12/14/2023 1:10:49 AM  Radiology DG Chest 2 View Result Date: 12/13/2023 CLINICAL DATA:  cp EXAM: CHEST - 2 VIEW COMPARISON:  CT chest 09/21/2010 FINDINGS: The heart and mediastinal contours  are within normal limits. No focal consolidation. No pulmonary edema. No pleural effusion. No pneumothorax. No acute osseous abnormality. IMPRESSION: No active cardiopulmonary disease. Electronically Signed   By: Morgane  Naveau M.D.   On: 12/13/2023 20:45    Procedures Procedures    Medications Ordered in ED Medications  acetaminophen  (TYLENOL ) tablet 1,000 mg (1,000 mg Oral Given 12/13/23 2054)  alum & mag hydroxide-simeth (MAALOX/MYLANTA) 200-200-20 MG/5ML suspension 30 mL (30 mLs Oral Given 12/13/23 2054)  famotidine  (PEPCID ) tablet 20 mg (20 mg Oral Given 12/13/23 2054)  pantoprazole  (PROTONIX ) injection 40 mg (40 mg Intravenous Given 12/14/23 0141)  ondansetron  (ZOFRAN ) injection 4 mg (4 mg Intravenous Given 12/14/23 0143)  potassium chloride  SA (KLOR-CON  M) CR tablet 40 mEq (40 mEq Oral Given 12/14/23 0144)  cefTRIAXone  (ROCEPHIN ) 1 g in sodium chloride  0.9 % 100 mL IVPB (0 g Intravenous Stopped 12/14/23 0315)    ED Course/ Medical Decision Making/ A&P                          Medical Decision Making Amount and/or Complexity of Data Reviewed Labs: ordered. Decision-making details documented in ED Course. Radiology: ordered. Decision-making details documented in ED Course.  Risk Prescription drug management.    This patient presents to the ED for concern of nausea/vomiting, chest pain, +EtOH, this involves an  extensive number of treatment options, and is a complaint that carries with it a high risk of complications and morbidity.  I considered the following differential and admission for this acute, potentially life threatening condition. Patient is overall very well-appearing and hDS.   MDM:    DDX for chest pain includes but is not limited to:  Patient with nausea vomiting and chest pain as well as dysuria.  She was treated with Pepcid , Maalox, and Tylenol  on arrival to the emergency department in triage with some improvement in her symptoms though she states she does feel still mildly nauseated and still has mild chest pain.  Chest x-ray without any pulmonary hemo-, pleural effusion, PTX, or PNA.  She has no leg swelling or crackles on exam to suggest CHF exacerbation.  Clinically lower concern for aortic dissection.  No signs or symptoms of DVT, no shortness of breath or hypoxia that would indicate a pulmonary embolism.  She denied hematemesis in triage but endorses possible hematemesis with me. For possible hematmesis, N/V and chest pain, consider mallory weiss tear or boerhaave's syndrome. Patient with CXR without any pneumomediastinum. She's had no further vomiting in the ED, and no hematemesis noted. Stable hgb. Also consider GERD/PUD/gastritis, especially given that she was drinking alcohol, EtOH on arrival is 233, will give Protonix  and zofran  and reevaluate.  Hepatic function panel is stable from prior, no significant leukocytosis and her hemoglobin is 11.3 increased from prior.  She has very mild hypokalemia 3.2 which is repleted here.  She has no abdominal tenderness or fever that would indicate severe surgical intra-abdominal infection. She also endorses dysuria and her UA is positive for UTI.  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  Will give IV ceftriaxone  and discharge with Keflex  if patient can tolerate p.o.   Clinical Course as of 12/14/23 0400  Sun Dec 14, 2023  0110 WBC: 6.6 No leukocytosis  [HN]  0110  Alcohol, Ethyl (B)(!): 233 +EtOH 223 [HN]  0119 DG Chest 2 View No active cardiopulmonary disease [HN]  0153 Hepatic function panel(!) Similar to prior w/ mild elevation in AST [HN]  0153 Urinalysis, Routine w reflex microscopic -  Urine, Clean Catch(!) +UTI, will give ceftriaxone  [HN]  0350 Patient reevaluated.  She feels well.  She has had no vomiting here in the department and she tolerated p.o.  She states she already has medications at home for reflux.  I will prescribe Keflex  to 50 mg 4 times daily x 5 days as well as Zofran  ODT as needed.  Discussed with patient about her UTI.  Discussed that if she begins vomiting again at home she will need to return to the emergency department to receive IV antibiotics for her UTI.  Also advised to return if she experienced any hematemesis, hematochezia, or melena or recurrent chest pain.  Counseled on alcohol cessation as well, patient reports understanding. [HN]    Clinical Course User Index [HN] Franklyn Sid SAILOR, MD    Labs: I Ordered, and personally interpreted labs.  The pertinent results include:  those listed above  Imaging Studies ordered: I ordered imaging studies including CXR I independently visualized and interpreted imaging. I agree with the radiologist interpretation  Additional history obtained from chart review.    Cardiac Monitoring: The patient was maintained on a cardiac monitor.  I personally viewed and interpreted the cardiac monitored which showed an underlying rhythm of: NSR  Reevaluation: After the interventions noted above, I reevaluated the patient and found that they have :improved  Social Determinants of Health: Lives independently  Disposition:  DC w/ discharge instructions/return precautions. All questions answered to patient's satisfaction.    Co morbidities that complicate the patient evaluation  Past Medical History:  Diagnosis Date   Arthritis    back, right hand (07/11/2014)   Breast cancer (HCC)  2011   right breast   Breast cancer, right breast (HCC) 05/2009   Chronic lower back pain    GERD (gastroesophageal reflux disease)    Hepatic steatosis 07/17/2011   Hypertension    Hypokalemia 07/17/2011   Lower GI bleeding 07/11/2014 hospitalized   Neuromuscular disorder (HCC)    numbness in legs and feet   Personal history of radiation therapy 2011     Medicines Meds ordered this encounter  Medications   acetaminophen  (TYLENOL ) tablet 1,000 mg   alum & mag hydroxide-simeth (MAALOX/MYLANTA) 200-200-20 MG/5ML suspension 30 mL   famotidine  (PEPCID ) tablet 20 mg   pantoprazole  (PROTONIX ) injection 40 mg   ondansetron  (ZOFRAN ) injection 4 mg   potassium chloride  SA (KLOR-CON  M) CR tablet 40 mEq   cefTRIAXone  (ROCEPHIN ) 1 g in sodium chloride  0.9 % 100 mL IVPB    Antibiotic Indication::   UTI   cephALEXin  (KEFLEX ) 250 MG/5ML suspension    Sig: Take 5 mLs (250 mg total) by mouth 4 (four) times daily for 5 days.    Dispense:  100 mL    Refill:  0   ondansetron  (ZOFRAN -ODT) 4 MG disintegrating tablet    Sig: Take 1 tablet (4 mg total) by mouth every 8 (eight) hours as needed.    Dispense:  20 tablet    Refill:  0   famotidine  (PEPCID ) 20 MG tablet    Sig: Take 1 tablet (20 mg total) by mouth daily.    Dispense:  30 tablet    Refill:  0    I have reviewed the patients home medicines and have made adjustments as needed  Problem List / ED Course: Problem List Items Addressed This Visit   None Visit Diagnoses       Nausea and vomiting, unspecified vomiting type    -  Primary  Acute cystitis without hematuria         Chest pain, unspecified type                       This note was created using dictation software, which may contain spelling or grammatical errors.    Franklyn Sid SAILOR, MD 12/14/23 0400

## 2023-12-14 NOTE — ED Notes (Signed)
 Patient able to ambulate to restroom. Steady gait.

## 2023-12-14 NOTE — ED Notes (Signed)
 Patient verbalizes understanding of discharge instructions. Opportunity for questioning and answers were provided. Armband removed by staff, pt discharged from ED. Wheeled out to lobby, awaiting cab

## 2023-12-14 NOTE — Discharge Instructions (Addendum)
 Thank you for coming to [facility] Emergency Department. You were seen for nausea/vomiting and chest pain. We did an exam, labs, and imaging, and these showed a urinary tract infection.  - For your chest pain, please follow-up with your cardiologist in the clinic.  If your chest pain returns or gets worse, please return to the emergency department. - Please watch for any bloody vomit, black stools, or bloody stools.  If these occur, please return to the emergency department. - We prescribed Keflex  250 mg 4 times a day for 5 days for your urinary tract infection as well as Zofran  under the tongue 4 mg every 6-8 hours as needed for nausea and vomiting.  If you begin vomiting so significantly that you cannot keep the antibiotic down, please return to the emergency department.  Please follow up with your primary care provider within 1 week.   Do not hesitate to return to the ED or call 911 if you experience: -Worsening symptoms -Lightheadedness, passing out -Fevers/chills -Anything else that concerns you  Please decrease your alcohol intake.  You can consider discussing with your primary care physician about help with this.

## 2024-01-29 ENCOUNTER — Other Ambulatory Visit: Payer: Self-pay

## 2024-01-29 DIAGNOSIS — R1310 Dysphagia, unspecified: Secondary | ICD-10-CM
